# Patient Record
Sex: Male | Born: 1935
Health system: Southern US, Community
[De-identification: ages and names within clinical notes are randomized; demographics above are authoritative.]

## PROBLEM LIST (undated history)

## (undated) DIAGNOSIS — I77810 Thoracic aortic ectasia: Secondary | ICD-10-CM

## (undated) DIAGNOSIS — G4752 REM sleep behavior disorder: Secondary | ICD-10-CM

## (undated) DIAGNOSIS — E7800 Pure hypercholesterolemia, unspecified: Secondary | ICD-10-CM

## (undated) DIAGNOSIS — I2510 Atherosclerotic heart disease of native coronary artery without angina pectoris: Secondary | ICD-10-CM

## (undated) DIAGNOSIS — Z5189 Encounter for other specified aftercare: Secondary | ICD-10-CM

## (undated) DIAGNOSIS — N289 Disorder of kidney and ureter, unspecified: Secondary | ICD-10-CM

## (undated) DIAGNOSIS — I700 Atherosclerosis of aorta: Secondary | ICD-10-CM

## (undated) DIAGNOSIS — K921 Melena: Secondary | ICD-10-CM

## (undated) DIAGNOSIS — I679 Cerebrovascular disease, unspecified: Secondary | ICD-10-CM

## (undated) DIAGNOSIS — I219 Acute myocardial infarction, unspecified: Secondary | ICD-10-CM

## (undated) DIAGNOSIS — I10 Essential (primary) hypertension: Secondary | ICD-10-CM

## (undated) DIAGNOSIS — I714 Abdominal aortic aneurysm, without rupture, unspecified: Secondary | ICD-10-CM

## (undated) HISTORY — DX: Atherosclerosis of aorta: I70.0

## (undated) HISTORY — DX: Encounter for other specified aftercare: Z51.89

## (undated) HISTORY — DX: Thoracic aortic ectasia: I77.810

## (undated) HISTORY — DX: Abdominal aortic aneurysm, without rupture, unspecified: I71.40

## (undated) HISTORY — DX: Pure hypercholesterolemia, unspecified: E78.00

## (undated) HISTORY — DX: Atherosclerotic heart disease of native coronary artery without angina pectoris: I25.10

## (undated) HISTORY — PX: BRAIN SURGERY: SHX531

---
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
## 2003-12-08 ENCOUNTER — Ambulatory Visit (HOSPITAL_COMMUNITY): Admission: RE | Admit: 2003-12-08 | Discharge: 2003-12-08 | Payer: Self-pay | Admitting: Gastroenterology

## 2004-09-23 ENCOUNTER — Encounter: Admission: RE | Admit: 2004-09-23 | Discharge: 2004-09-23 | Payer: Self-pay | Admitting: Nephrology

## 2004-12-05 ENCOUNTER — Encounter: Admission: RE | Admit: 2004-12-05 | Discharge: 2005-03-05 | Payer: Self-pay | Admitting: Nephrology

## 2011-03-22 DIAGNOSIS — N183 Chronic kidney disease, stage 3 unspecified: Secondary | ICD-10-CM | POA: Diagnosis not present

## 2011-03-22 DIAGNOSIS — D509 Iron deficiency anemia, unspecified: Secondary | ICD-10-CM | POA: Diagnosis not present

## 2011-03-22 DIAGNOSIS — N2581 Secondary hyperparathyroidism of renal origin: Secondary | ICD-10-CM | POA: Diagnosis not present

## 2011-03-22 DIAGNOSIS — I10 Essential (primary) hypertension: Secondary | ICD-10-CM | POA: Diagnosis not present

## 2011-06-23 DIAGNOSIS — Z79899 Other long term (current) drug therapy: Secondary | ICD-10-CM | POA: Diagnosis not present

## 2011-06-23 DIAGNOSIS — Z125 Encounter for screening for malignant neoplasm of prostate: Secondary | ICD-10-CM | POA: Diagnosis not present

## 2011-06-23 DIAGNOSIS — I10 Essential (primary) hypertension: Secondary | ICD-10-CM | POA: Diagnosis not present

## 2011-06-23 DIAGNOSIS — E7800 Pure hypercholesterolemia, unspecified: Secondary | ICD-10-CM | POA: Diagnosis not present

## 2011-06-23 DIAGNOSIS — Q849 Congenital malformation of integument, unspecified: Secondary | ICD-10-CM | POA: Diagnosis not present

## 2011-07-10 DIAGNOSIS — L259 Unspecified contact dermatitis, unspecified cause: Secondary | ICD-10-CM | POA: Diagnosis not present

## 2011-11-07 DIAGNOSIS — E1129 Type 2 diabetes mellitus with other diabetic kidney complication: Secondary | ICD-10-CM | POA: Diagnosis not present

## 2011-11-07 DIAGNOSIS — IMO0001 Reserved for inherently not codable concepts without codable children: Secondary | ICD-10-CM | POA: Diagnosis not present

## 2011-11-07 DIAGNOSIS — I10 Essential (primary) hypertension: Secondary | ICD-10-CM | POA: Diagnosis not present

## 2011-11-07 DIAGNOSIS — R634 Abnormal weight loss: Secondary | ICD-10-CM | POA: Diagnosis not present

## 2011-11-14 DIAGNOSIS — IMO0001 Reserved for inherently not codable concepts without codable children: Secondary | ICD-10-CM | POA: Diagnosis not present

## 2011-11-20 DIAGNOSIS — N2581 Secondary hyperparathyroidism of renal origin: Secondary | ICD-10-CM | POA: Diagnosis not present

## 2011-11-20 DIAGNOSIS — N183 Chronic kidney disease, stage 3 unspecified: Secondary | ICD-10-CM | POA: Diagnosis not present

## 2011-11-20 DIAGNOSIS — D509 Iron deficiency anemia, unspecified: Secondary | ICD-10-CM | POA: Diagnosis not present

## 2011-11-20 DIAGNOSIS — I10 Essential (primary) hypertension: Secondary | ICD-10-CM | POA: Diagnosis not present

## 2011-12-06 ENCOUNTER — Other Ambulatory Visit (HOSPITAL_COMMUNITY): Payer: Self-pay | Admitting: *Deleted

## 2011-12-07 DIAGNOSIS — N183 Chronic kidney disease, stage 3 unspecified: Secondary | ICD-10-CM | POA: Diagnosis not present

## 2011-12-07 DIAGNOSIS — I10 Essential (primary) hypertension: Secondary | ICD-10-CM | POA: Diagnosis not present

## 2011-12-07 DIAGNOSIS — D509 Iron deficiency anemia, unspecified: Secondary | ICD-10-CM | POA: Diagnosis not present

## 2011-12-07 DIAGNOSIS — N2581 Secondary hyperparathyroidism of renal origin: Secondary | ICD-10-CM | POA: Diagnosis not present

## 2011-12-08 ENCOUNTER — Encounter (HOSPITAL_COMMUNITY)
Admission: RE | Admit: 2011-12-08 | Discharge: 2011-12-08 | Disposition: A | Discharge status: Home / Self Care | Payer: Medicare Other | Source: Ambulatory Visit | Attending: Nephrology | Admitting: Nephrology

## 2011-12-08 DIAGNOSIS — D649 Anemia, unspecified: Secondary | ICD-10-CM | POA: Insufficient documentation

## 2011-12-08 MED ORDER — SODIUM CHLORIDE 0.9 % IV SOLN
INTRAVENOUS | Status: DC
Start: 2011-12-08 — End: 2011-12-09
  Administered 2011-12-08: 10:00:00 via INTRAVENOUS

## 2011-12-08 MED ORDER — FERUMOXYTOL INJECTION 510 MG/17 ML
510.0000 mg | INTRAVENOUS | Status: DC
  Administered 2011-12-08: 510 mg via INTRAVENOUS

## 2011-12-08 MED ORDER — FERUMOXYTOL INJECTION 510 MG/17 ML
INTRAVENOUS | Status: AC
  Administered 2011-12-08: 510 mg via INTRAVENOUS
  Filled 2011-12-08: qty 17

## 2011-12-15 ENCOUNTER — Encounter (HOSPITAL_COMMUNITY)
Admission: RE | Admit: 2011-12-15 | Discharge: 2011-12-15 | Disposition: A | Discharge status: Home / Self Care | Payer: Medicare Other | Source: Ambulatory Visit | Attending: Nephrology | Admitting: Nephrology

## 2011-12-15 DIAGNOSIS — D649 Anemia, unspecified: Secondary | ICD-10-CM | POA: Insufficient documentation

## 2011-12-15 MED ORDER — FERUMOXYTOL INJECTION 510 MG/17 ML
510.0000 mg | INTRAVENOUS | Status: AC
  Administered 2011-12-15: 510 mg via INTRAVENOUS

## 2011-12-15 MED ORDER — SODIUM CHLORIDE 0.9 % IV SOLN
INTRAVENOUS | Status: AC
  Administered 2011-12-15: 11:00:00 via INTRAVENOUS

## 2011-12-19 DIAGNOSIS — IMO0001 Reserved for inherently not codable concepts without codable children: Secondary | ICD-10-CM | POA: Diagnosis not present

## 2012-02-27 DIAGNOSIS — E119 Type 2 diabetes mellitus without complications: Secondary | ICD-10-CM | POA: Diagnosis not present

## 2012-02-27 DIAGNOSIS — I10 Essential (primary) hypertension: Secondary | ICD-10-CM | POA: Diagnosis not present

## 2012-02-27 DIAGNOSIS — D509 Iron deficiency anemia, unspecified: Secondary | ICD-10-CM | POA: Diagnosis not present

## 2012-02-27 DIAGNOSIS — N2581 Secondary hyperparathyroidism of renal origin: Secondary | ICD-10-CM | POA: Diagnosis not present

## 2012-02-27 DIAGNOSIS — N183 Chronic kidney disease, stage 3 unspecified: Secondary | ICD-10-CM | POA: Diagnosis not present

## 2012-05-24 DIAGNOSIS — I10 Essential (primary) hypertension: Secondary | ICD-10-CM | POA: Diagnosis not present

## 2012-05-24 DIAGNOSIS — N183 Chronic kidney disease, stage 3 unspecified: Secondary | ICD-10-CM | POA: Diagnosis not present

## 2012-05-24 DIAGNOSIS — D509 Iron deficiency anemia, unspecified: Secondary | ICD-10-CM | POA: Diagnosis not present

## 2012-05-24 DIAGNOSIS — N2581 Secondary hyperparathyroidism of renal origin: Secondary | ICD-10-CM | POA: Diagnosis not present

## 2012-05-24 DIAGNOSIS — E119 Type 2 diabetes mellitus without complications: Secondary | ICD-10-CM | POA: Diagnosis not present

## 2012-06-11 DIAGNOSIS — I10 Essential (primary) hypertension: Secondary | ICD-10-CM | POA: Diagnosis not present

## 2012-06-11 DIAGNOSIS — E119 Type 2 diabetes mellitus without complications: Secondary | ICD-10-CM | POA: Diagnosis not present

## 2012-06-11 DIAGNOSIS — N183 Chronic kidney disease, stage 3 unspecified: Secondary | ICD-10-CM | POA: Diagnosis not present

## 2012-06-11 DIAGNOSIS — D509 Iron deficiency anemia, unspecified: Secondary | ICD-10-CM | POA: Diagnosis not present

## 2012-09-16 DIAGNOSIS — E119 Type 2 diabetes mellitus without complications: Secondary | ICD-10-CM | POA: Diagnosis not present

## 2012-09-16 DIAGNOSIS — D509 Iron deficiency anemia, unspecified: Secondary | ICD-10-CM | POA: Diagnosis not present

## 2012-09-16 DIAGNOSIS — N183 Chronic kidney disease, stage 3 unspecified: Secondary | ICD-10-CM | POA: Diagnosis not present

## 2012-09-16 DIAGNOSIS — I10 Essential (primary) hypertension: Secondary | ICD-10-CM | POA: Diagnosis not present

## 2012-12-18 DIAGNOSIS — N183 Chronic kidney disease, stage 3 unspecified: Secondary | ICD-10-CM | POA: Diagnosis not present

## 2012-12-18 DIAGNOSIS — D509 Iron deficiency anemia, unspecified: Secondary | ICD-10-CM | POA: Diagnosis not present

## 2012-12-18 DIAGNOSIS — E119 Type 2 diabetes mellitus without complications: Secondary | ICD-10-CM | POA: Diagnosis not present

## 2012-12-18 DIAGNOSIS — I10 Essential (primary) hypertension: Secondary | ICD-10-CM | POA: Diagnosis not present

## 2013-01-08 DIAGNOSIS — N058 Unspecified nephritic syndrome with other morphologic changes: Secondary | ICD-10-CM | POA: Diagnosis not present

## 2013-01-08 DIAGNOSIS — N183 Chronic kidney disease, stage 3 unspecified: Secondary | ICD-10-CM | POA: Diagnosis not present

## 2013-01-08 DIAGNOSIS — E1129 Type 2 diabetes mellitus with other diabetic kidney complication: Secondary | ICD-10-CM | POA: Diagnosis not present

## 2013-03-03 DIAGNOSIS — J019 Acute sinusitis, unspecified: Secondary | ICD-10-CM | POA: Diagnosis not present

## 2013-06-18 DIAGNOSIS — N183 Chronic kidney disease, stage 3 unspecified: Secondary | ICD-10-CM | POA: Diagnosis not present

## 2013-06-18 DIAGNOSIS — N2581 Secondary hyperparathyroidism of renal origin: Secondary | ICD-10-CM | POA: Diagnosis not present

## 2013-06-18 DIAGNOSIS — E785 Hyperlipidemia, unspecified: Secondary | ICD-10-CM | POA: Diagnosis not present

## 2014-01-22 DIAGNOSIS — N2581 Secondary hyperparathyroidism of renal origin: Secondary | ICD-10-CM | POA: Diagnosis not present

## 2014-01-22 DIAGNOSIS — E1129 Type 2 diabetes mellitus with other diabetic kidney complication: Secondary | ICD-10-CM | POA: Diagnosis not present

## 2014-01-22 DIAGNOSIS — N183 Chronic kidney disease, stage 3 (moderate): Secondary | ICD-10-CM | POA: Diagnosis not present

## 2014-02-04 DIAGNOSIS — N183 Chronic kidney disease, stage 3 (moderate): Secondary | ICD-10-CM | POA: Diagnosis not present

## 2014-02-04 DIAGNOSIS — I129 Hypertensive chronic kidney disease with stage 1 through stage 4 chronic kidney disease, or unspecified chronic kidney disease: Secondary | ICD-10-CM | POA: Diagnosis not present

## 2014-04-21 DIAGNOSIS — N183 Chronic kidney disease, stage 3 (moderate): Secondary | ICD-10-CM | POA: Diagnosis not present

## 2014-04-21 DIAGNOSIS — N2581 Secondary hyperparathyroidism of renal origin: Secondary | ICD-10-CM | POA: Diagnosis not present

## 2014-04-21 DIAGNOSIS — D509 Iron deficiency anemia, unspecified: Secondary | ICD-10-CM | POA: Diagnosis not present

## 2014-06-12 DIAGNOSIS — K921 Melena: Secondary | ICD-10-CM

## 2014-06-12 HISTORY — DX: Melena: K92.1

## 2014-06-24 DIAGNOSIS — N2581 Secondary hyperparathyroidism of renal origin: Secondary | ICD-10-CM | POA: Diagnosis not present

## 2014-06-24 DIAGNOSIS — N183 Chronic kidney disease, stage 3 (moderate): Secondary | ICD-10-CM | POA: Diagnosis not present

## 2014-06-24 DIAGNOSIS — D509 Iron deficiency anemia, unspecified: Secondary | ICD-10-CM | POA: Diagnosis not present

## 2014-06-24 DIAGNOSIS — E1129 Type 2 diabetes mellitus with other diabetic kidney complication: Secondary | ICD-10-CM | POA: Diagnosis not present

## 2014-07-10 ENCOUNTER — Inpatient Hospital Stay (HOSPITAL_COMMUNITY)
Admission: EM | Admit: 2014-07-10 | Discharge: 2014-07-13 | DRG: 378 | Disposition: A | Discharge status: Home / Self Care | Payer: Medicare Other | Attending: Internal Medicine | Admitting: Internal Medicine

## 2014-07-10 ENCOUNTER — Encounter (HOSPITAL_COMMUNITY): Payer: Self-pay | Admitting: *Deleted

## 2014-07-10 DIAGNOSIS — K253 Acute gastric ulcer without hemorrhage or perforation: Secondary | ICD-10-CM | POA: Diagnosis not present

## 2014-07-10 DIAGNOSIS — R000 Tachycardia, unspecified: Secondary | ICD-10-CM | POA: Diagnosis not present

## 2014-07-10 DIAGNOSIS — R42 Dizziness and giddiness: Secondary | ICD-10-CM | POA: Diagnosis present

## 2014-07-10 DIAGNOSIS — K921 Melena: Secondary | ICD-10-CM | POA: Diagnosis not present

## 2014-07-10 DIAGNOSIS — K2970 Gastritis, unspecified, without bleeding: Secondary | ICD-10-CM

## 2014-07-10 DIAGNOSIS — B9681 Helicobacter pylori [H. pylori] as the cause of diseases classified elsewhere: Secondary | ICD-10-CM | POA: Diagnosis not present

## 2014-07-10 DIAGNOSIS — E876 Hypokalemia: Secondary | ICD-10-CM | POA: Diagnosis present

## 2014-07-10 DIAGNOSIS — K250 Acute gastric ulcer with hemorrhage: Secondary | ICD-10-CM | POA: Diagnosis not present

## 2014-07-10 DIAGNOSIS — E871 Hypo-osmolality and hyponatremia: Secondary | ICD-10-CM | POA: Diagnosis present

## 2014-07-10 DIAGNOSIS — Z791 Long term (current) use of non-steroidal anti-inflammatories (NSAID): Secondary | ICD-10-CM

## 2014-07-10 DIAGNOSIS — Z7982 Long term (current) use of aspirin: Secondary | ICD-10-CM | POA: Diagnosis not present

## 2014-07-10 DIAGNOSIS — R609 Edema, unspecified: Secondary | ICD-10-CM | POA: Diagnosis present

## 2014-07-10 DIAGNOSIS — K254 Chronic or unspecified gastric ulcer with hemorrhage: Secondary | ICD-10-CM | POA: Diagnosis not present

## 2014-07-10 DIAGNOSIS — D62 Acute posthemorrhagic anemia: Secondary | ICD-10-CM | POA: Diagnosis present

## 2014-07-10 DIAGNOSIS — R040 Epistaxis: Secondary | ICD-10-CM | POA: Diagnosis not present

## 2014-07-10 DIAGNOSIS — N179 Acute kidney failure, unspecified: Secondary | ICD-10-CM | POA: Diagnosis present

## 2014-07-10 DIAGNOSIS — R739 Hyperglycemia, unspecified: Secondary | ICD-10-CM | POA: Diagnosis present

## 2014-07-10 DIAGNOSIS — E870 Hyperosmolality and hypernatremia: Secondary | ICD-10-CM | POA: Diagnosis present

## 2014-07-10 DIAGNOSIS — K2960 Other gastritis without bleeding: Secondary | ICD-10-CM | POA: Diagnosis present

## 2014-07-10 DIAGNOSIS — R1013 Epigastric pain: Secondary | ICD-10-CM | POA: Diagnosis not present

## 2014-07-10 DIAGNOSIS — E860 Dehydration: Secondary | ICD-10-CM | POA: Diagnosis not present

## 2014-07-10 DIAGNOSIS — I10 Essential (primary) hypertension: Secondary | ICD-10-CM | POA: Diagnosis present

## 2014-07-10 DIAGNOSIS — D696 Thrombocytopenia, unspecified: Secondary | ICD-10-CM | POA: Diagnosis present

## 2014-07-10 DIAGNOSIS — I959 Hypotension, unspecified: Secondary | ICD-10-CM | POA: Diagnosis present

## 2014-07-10 DIAGNOSIS — Z79899 Other long term (current) drug therapy: Secondary | ICD-10-CM

## 2014-07-10 DIAGNOSIS — K922 Gastrointestinal hemorrhage, unspecified: Secondary | ICD-10-CM | POA: Diagnosis not present

## 2014-07-10 HISTORY — DX: Essential (primary) hypertension: I10

## 2014-07-10 HISTORY — DX: Disorder of kidney and ureter, unspecified: N28.9

## 2014-07-10 HISTORY — DX: Acute posthemorrhagic anemia: D62

## 2014-07-10 LAB — CBC
HCT: 18.2 % — ABNORMAL LOW (ref 39.0–52.0)
Hemoglobin: 5.9 g/dL — CL (ref 13.0–17.0)
MCH: 26.7 pg (ref 26.0–34.0)
MCHC: 32.4 g/dL (ref 30.0–36.0)
MCV: 82.4 fL (ref 78.0–100.0)
Platelets: 165 10*3/uL (ref 150–400)
RBC: 2.21 MIL/uL — ABNORMAL LOW (ref 4.22–5.81)
RDW: 18.1 % — ABNORMAL HIGH (ref 11.5–15.5)
WBC: 9.1 10*3/uL (ref 4.0–10.5)

## 2014-07-10 LAB — BASIC METABOLIC PANEL
Anion gap: 4 — ABNORMAL LOW (ref 5–15)
BUN: 71 mg/dL — ABNORMAL HIGH (ref 6–23)
CO2: 24 mmol/L (ref 19–32)
Calcium: 8.8 mg/dL (ref 8.4–10.5)
Chloride: 116 mmol/L — ABNORMAL HIGH (ref 96–112)
Creatinine, Ser: 1.95 mg/dL — ABNORMAL HIGH (ref 0.50–1.35)
GFR calc Af Amer: 37 mL/min — ABNORMAL LOW (ref >90)
GFR calc non Af Amer: 32 mL/min — ABNORMAL LOW (ref >90)
Glucose, Bld: 200 mg/dL — ABNORMAL HIGH (ref 70–99)
Potassium: 4.5 mmol/L (ref 3.5–5.1)
Sodium: 144 mmol/L (ref 135–145)

## 2014-07-10 LAB — PREPARE RBC (CROSSMATCH)

## 2014-07-10 LAB — ABO/RH: ABO/RH(D): O POS

## 2014-07-10 LAB — POC OCCULT BLOOD, ED: Fecal Occult Bld: POSITIVE — AB

## 2014-07-10 MED ORDER — PANTOPRAZOLE SODIUM 40 MG IV SOLR
40.0000 mg | Freq: Two times a day (BID) | INTRAVENOUS | Status: DC
Start: 2014-07-14 — End: 2014-07-10

## 2014-07-10 MED ORDER — SODIUM CHLORIDE 0.9 % IV SOLN
10.0000 mL/h | Freq: Once | INTRAVENOUS | Status: AC
  Administered 2014-07-10: 10 mL/h via INTRAVENOUS

## 2014-07-10 MED ORDER — SODIUM CHLORIDE 0.9 % IV SOLN
8.0000 mg/h | INTRAVENOUS | Status: AC
  Administered 2014-07-10 – 2014-07-13 (×7): 8 mg/h via INTRAVENOUS
  Filled 2014-07-10 (×14): qty 80

## 2014-07-10 MED ORDER — SODIUM CHLORIDE 0.9 % IV BOLUS (SEPSIS)
1000.0000 mL | Freq: Once | INTRAVENOUS | Status: AC
  Administered 2014-07-10: 1000 mL via INTRAVENOUS

## 2014-07-10 MED ORDER — SODIUM CHLORIDE 0.9 % IV SOLN
INTRAVENOUS | Status: DC
  Administered 2014-07-11 – 2014-07-13 (×4): via INTRAVENOUS

## 2014-07-10 MED ORDER — SODIUM CHLORIDE 0.9 % IV SOLN
80.0000 mg | Freq: Once | INTRAVENOUS | Status: AC
  Administered 2014-07-10: 80 mg via INTRAVENOUS
  Filled 2014-07-10: qty 80

## 2014-07-10 MED ORDER — ONDANSETRON HCL 4 MG PO TABS: 4.0000 mg | ORAL_TABLET | Freq: Four times a day (QID) | ORAL | Status: DC | PRN

## 2014-07-10 MED ORDER — ONDANSETRON HCL 4 MG/2ML IJ SOLN: 4.0000 mg | Freq: Four times a day (QID) | INTRAMUSCULAR | Status: DC | PRN

## 2014-07-10 MED ORDER — PANTOPRAZOLE SODIUM 40 MG IV SOLR: 40.0000 mg | Freq: Two times a day (BID) | INTRAVENOUS | Status: DC

## 2014-07-10 MED ORDER — SODIUM CHLORIDE 0.9 % IJ SOLN
3.0000 mL | Freq: Two times a day (BID) | INTRAMUSCULAR | Status: DC
  Administered 2014-07-12 – 2014-07-13 (×3): 3 mL via INTRAVENOUS

## 2014-07-10 MED ORDER — SODIUM CHLORIDE 0.9 % IV SOLN: Freq: Once | INTRAVENOUS | Status: DC

## 2014-07-10 NOTE — H&P (Signed)
[facility] History and Physical  TARAY NORMOYLE ZOX:[id number] DOB: 1935/12/29 DOA: 07/10/2014  Referring physician: Dr. Silverio Lay PCP:  Duane Lope, MD   Chief Complaint: melena and dizziness   HPI:  Pt is 79 yo male who presented to [facility] ED with main concern of 3 days duration of epigastric pain, intermittent and throbbing, 7/10 in severity when present, non radiating, no specific alleviating or aggravating factors, no similar events in the past. Pt also reports noting melena in the past several days, dizziness and weakness especially with standing and exertion. Pt has also noted some exertional dyspnea but no cough, no chest pain. Pt denies fevers, chills, no specific urinary concerns, no specific focal neurological symptoms.   In ED, pt noted to be hemodynamically stable, VSS, blood work notable for Hg 5.9. TRH asked to admit for further evaluation. GI team consulted. Plan for EGD in AM, keep NPO after midnight.   Assessment and Plan: Principal Problem:   Acute blood loss anemia - with reported melena - one unit requested for transfusion in Ed, will ask for two more units PRBC for transfusion - repeat CBC in AM - plan for EGD in AM - appreciate GI team assistance    Hypotension  - from acute blood loss anemia - proceed with blood transfusion as noted above - also provide IVF   Acute renal failure - from pre renal etiology in the setting of acute blood loss anemia - will repeat BMP in AM   Hyperglycemia - check A1C with am blood work    DVT prophylaxis  - SCD's  Radiological Exams on Admission: No results found.   Code Status: Full Family Communication: Pt at bedside Disposition Plan: Admit for further evaluation    Danie Binder [facility] [phone]   Review of Systems:  Constitutional: Negative for fever, chills. Negative for diaphoresis.  HENT: Negative for hearing loss, ear pain, nosebleeds, congestion, sore throat, neck pain, tinnitus and ear discharge.   Eyes: Negative  for blurred vision, double vision, photophobia, pain, discharge and redness.  Respiratory: Negative for cough, hemoptysis, sputum production, wheezing and stridor.   Cardiovascular: Negative for palpitations, orthopnea, claudication and leg swelling.  Gastrointestinal: Negative for nausea, vomiting Genitourinary: Negative for dysuria, urgency, frequency, hematuria and flank pain.  Musculoskeletal: Negative for myalgias, back pain, joint pain and falls.  Skin: Negative for itching and rash.  Neurological: Negative for slurred speech  Endo/Heme/Allergies: Negative for environmental allergies and polydipsia. Does not bruise/bleed easily.  Psychiatric/Behavioral: Negative for suicidal ideas. The patient is not nervous/anxious.      Past Medical History  Diagnosis Date  . Hypertension   . Renal insufficiency     History reviewed. No pertinent past surgical history.  Social History:  reports that he has never smoked. He does not have any smokeless tobacco history on file. He reports that he does not drink alcohol. His drug history is not on file.  No Known Allergies  Family history of HLD   Prior to Admission medications   Medication Sig Start Date End Date Taking? Authorizing Provider  aspirin 81 MG tablet Take 81 mg by mouth daily.   Yes Historical Provider, MD  loratadine (CLARITIN) 10 MG tablet Take 10 mg by mouth as needed for allergies.   Yes Historical Provider, MD  naproxen sodium (ANAPROX) 220 MG tablet Take 440 mg by mouth every 6 (six) hours as needed (pain).   Yes Historical Provider, MD  paricalcitol (ZEMPLAR) 1 MCG capsule Take 1 mcg by mouth  daily. 05/26/14  Yes Historical Provider, MD  simvastatin (ZOCOR) 40 MG tablet Take 40 mg by mouth at bedtime.   Yes Historical Provider, MD    Physical Exam: Filed Vitals:   07/10/14 1521 07/10/14 1536 07/10/14 1545 07/10/14 1600  BP: 133/50 118/60 128/64 131/64  Pulse: 74     Temp:      TempSrc:      Resp:  13 19 13   Height:       Weight:      SpO2: 100%       Physical Exam  Constitutional: Appears well-developed and well-nourished. No distress.  HENT: Normocephalic. External right and left ear normal. Dry MM Eyes: Conjunctivae and EOM are normal. PERRLA, no scleral icterus.  Neck: Normal ROM. Neck supple. No JVD. No tracheal deviation. No thyromegaly.  CVS: RRR, S1/S2 +, no murmurs, no gallops, no carotid bruit.  Pulmonary: Effort and breath sounds normal, no stridor, rhonchi, wheezes, rales.  Abdominal: Soft. BS +,  no distension, tenderness, rebound or guarding.  Musculoskeletal: Normal range of motion. No edema and no tenderness.  Lymphadenopathy: No lymphadenopathy noted, cervical, inguinal. Neuro: Alert. Normal reflexes, muscle tone coordination. No cranial nerve deficit. Skin: Skin is warm and dry. No rash noted. Not diaphoretic. No erythema. No pallor.  Psychiatric: Normal mood and affect. Behavior, judgment, thought content normal.   Labs on Admission:  Basic Metabolic Panel:  Recent Labs Lab 07/10/14 1432  NA 144  K 4.5  CL 116*  CO2 24  GLUCOSE 200*  BUN 71*  CREATININE 1.95*  CALCIUM 8.8   CBC:  Recent Labs Lab 07/10/14 1432  WBC 9.1  HGB 5.9*  HCT 18.2*  MCV 82.4  PLT 165    EKG: pending    If 7PM-7AM, please contact night-coverage [email or web address] Password [email or web address] 07/10/2014, 4:15 PM

## 2014-07-10 NOTE — ED Notes (Signed)
Nurse getting type and screen from IV

## 2014-07-10 NOTE — Consult Note (Signed)
Referring Provider: No ref. provider found Primary Care Physician:   Duane Lope, MD Primary Gastroenterologist:  Gentry Fitz  Reason for Consultation:  Melena  HPI: Juan Goodwin is a 79 y.o. male admitted for weakness, dizziness and black stools for the past 4 days. Reports one loose black stool per day. Felt nauseous earlier this week without vomiting. Denies hematochezia. Reports occasional belching and heartburn over the last 2 years. Has had poor appetite this week. Denies weight loss, chest pain, dysuria, hematuria. Has felt short of breath while gardening recently. Hgb 5.9. Became syncopal during orthostatic check in the ER and BP dropped into the 70's but normalizes with lying down. Takes Naproxen and Aspirin. Denies alcohol stating that he drank socially 15 years ago. Denies history of ulcers and denies ever having an EGD. Thinks he had a colonoscopy in the past but does not know the results and no record of one in his [facility] chart.   Past Medical History  Diagnosis Date  . Hypertension   . Renal insufficiency     History reviewed. No pertinent past surgical history.  Prior to Admission medications   Medication Sig Start Date End Date Taking? Authorizing Provider  aspirin 81 MG tablet Take 81 mg by mouth daily.   Yes Historical Provider, MD  loratadine (CLARITIN) 10 MG tablet Take 10 mg by mouth as needed for allergies.   Yes Historical Provider, MD  naproxen sodium (ANAPROX) 220 MG tablet Take 440 mg by mouth every 6 (six) hours as needed (pain).   Yes Historical Provider, MD  paricalcitol (ZEMPLAR) 1 MCG capsule Take 1 mcg by mouth daily. 05/26/14  Yes Historical Provider, MD  simvastatin (ZOCOR) 40 MG tablet Take 40 mg by mouth at bedtime.   Yes Historical Provider, MD    Scheduled Meds: . [START ON 07/14/2014] pantoprazole (PROTONIX) IV  40 mg Intravenous Q12H  . sodium chloride  3 mL Intravenous Q12H   Continuous Infusions: . sodium chloride    . pantoprozole (PROTONIX)  infusion 8 mg/hr (07/10/14 1614)   PRN Meds:.ondansetron **OR** ondansetron (ZOFRAN) IV  Allergies as of 07/10/2014  . (No Known Allergies)    History reviewed. No pertinent family history.  History   Social History  . Marital Status: Widowed    Spouse Name: N/A  . Number of Children: N/A  . Years of Education: N/A   Occupational History  . Not on file.   Social History Main Topics  . Smoking status: Never Smoker   . Smokeless tobacco: Never Used  . Alcohol Use: No  . Drug Use: Not on file  . Sexual Activity: No   Other Topics Concern  . Not on file   Social History Narrative  . No narrative on file    Review of Systems: Positive in bold: Gen: Denies any fever, chills, rigors, night sweats, anorexia, fatigue, weakness, malaise, involuntary weight loss, and sleep disorder CV: Denies chest pain, angina, palpitations Resp: Denies dyspnea, cough, sputum, wheezing, coughing up blood. GI: negative except as stated above in HPI  GU : Denies urinary burning, blood in urine, urinary frequency MS: Denies joint pain or swelling.  Denies muscle weakness, cramps, atrophy.  Derm: Denies rash Psych: Denies depression, anxiety Heme: Denies bruising, bleeding Neuro:  Denies any headaches, dizziness Endo:  Denies any problems with DM, thyroid, adrenal function.  Physical Exam: Vital signs in last 24 hours: Temp:  [97.5 F (36.4 C)-98.4 F (36.9 C)] 98.4 F (36.9 C) (04/29 1747) Pulse Rate:  [74-107]  91 (04/29 1747) Resp:  [11-19] 18 (04/29 1747) BP: (75-139)/(49-68) 119/57 mmHg (04/29 1747) SpO2:  [100 %] 100 % (04/29 1747) Weight:  [88.451 kg (195 lb)] 88.451 kg (195 lb) (04/29 1357) Last BM Date: 07/08/14   General:   Well-developed, well-nourished, pleasant and cooperative in NAD Head:  Normocephalic and atraumatic. Eyes:  Sclera clear, no icterus.   Conjunctiva pink. Ears:  Normal auditory acuity. Nose:  No deformity Mouth:  No deformity or lesions.  Oropharynx  pink & moist. Neck:  Supple; no masses or thyromegaly. Lungs:  Clear throughout to auscultation.   No wheezes, crackles, or rhonchi. No acute distress. Heart:  Regular rate and rhythm; no murmurs, clicks, rubs,  or gallops. Abdomen:  Soft, nontender and nondistended. Normal bowel sounds, without guarding, and without rebound.   Rectal:  Deferred Msk:  Symmetrical without gross deformities. Normal posture. Pulses:  Normal pulses noted. Extremities:  Without clubbing or edema. Neurologic:  Alert and oriented x4;  grossly normal neurologically. Skin:  No rash Psych:  Alert and cooperative. Normal mood and affect.   Lab Results:  Recent Labs  07/10/14 1432  WBC 9.1  HGB 5.9*  HCT 18.2*  PLT 165   BMET  Recent Labs  07/10/14 1432  NA 144  K 4.5  CL 116*  CO2 24  GLUCOSE 200*  BUN 71*  CREATININE 1.95*  CALCIUM 8.8   LFT No results for input(s): PROT, ALBUMIN, AST, ALT, ALKPHOS, BILITOT, BILIDIR, IBILI in the last 72 hours. PT/INR No results for input(s): LABPROT, INR in the last 72 hours.   Studies/Results: No results found.  Impression: 79 yo with 4 days of melena and symptomatic anemia likely due to an upper GI bleed such as a peptic ulcer. AVMs also possible. Doubt malignancy but still possible. EGD needed to further evaluate his GI bleed. Risk/benefits of EGD discussed and he agrees to proceed.  Plan: 1. Volume resuscitate  2. EGD in AM 3. Agree with Protonix drip 4. NPO    LOS: 0 days   Garry Bochicchio C.  07/10/2014, 6:52 PM

## 2014-07-10 NOTE — ED Notes (Signed)
Pt arrives via [facility] with sig other. Pt has had nausea, fever, weakness (too weak to walk without help) for about 3 days. CBG 204, vss en route.

## 2014-07-10 NOTE — ED Provider Notes (Signed)
CSN: [id number]     Arrival date & time 07/10/14  1354 History   First MD Initiated Contact with Patient 07/10/14 1500     Chief Complaint  Patient presents with  . Weakness     (Consider location/radiation/quality/duration/timing/severity/associated sxs/prior Treatment) The history is provided by the patient.  Katherine MantleJimmy L Springston is a 79 y.o. male hx of HTN, renal insufficieny here with nausea, melena, diffuse weakness. Nauseated with epigastric pain for the last 3 days. Has decrease intake. Also noticed some melena. Also feels diffusely weak and dizzy when he stands up. He states that he may have passed out. Denies any chest pain or shortness of breath. Previous colonoscopy with [facility].    Past Medical History  Diagnosis Date  . Hypertension   . Renal insufficiency    History reviewed. No pertinent past surgical history. No family history on file. History  Substance Use Topics  . Smoking status: Never Smoker   . Smokeless tobacco: Not on file  . Alcohol Use: No    Review of Systems  Neurological: Positive for weakness.  All other systems reviewed and are negative.     Allergies  Review of patient's allergies indicates no known allergies.  Home Medications   Prior to Admission medications   Medication Sig Start Date End Date Taking? Authorizing Provider  aspirin 81 MG tablet Take 81 mg by mouth daily.   Yes Historical Provider, MD  loratadine (CLARITIN) 10 MG tablet Take 10 mg by mouth as needed for allergies.   Yes Historical Provider, MD  naproxen sodium (ANAPROX) 220 MG tablet Take 440 mg by mouth every 6 (six) hours as needed (pain).   Yes Historical Provider, MD  paricalcitol (ZEMPLAR) 1 MCG capsule Take 1 mcg by mouth daily. 05/26/14  Yes Historical Provider, MD  simvastatin (ZOCOR) 40 MG tablet Take 40 mg by mouth at bedtime.   Yes Historical Provider, MD   BP 118/51 mmHg  Pulse 107  Temp(Src) 97.5 F (36.4 C) (Oral)  Resp 17  Ht 6\' 1"  (1.854 m)  Wt 195 lb  (88.451 kg)  BMI 25.73 kg/m2  SpO2 100% Physical Exam  Constitutional: He is oriented to person, place, and time.  Pale, ill appearing   HENT:  Head: Normocephalic.  MM dry   Eyes: Pupils are equal, round, and reactive to light.  Conjunctiva pale   Neck: Normal range of motion. Neck supple.  Cardiovascular: Regular rhythm and normal heart sounds.   Tachy   Pulmonary/Chest: Effort normal and breath sounds normal. No respiratory distress. He has no wheezes. He has no rales.  Abdominal: Soft. Bowel sounds are normal. He exhibits no distension. There is no tenderness. There is no rebound and no guarding.  Musculoskeletal: Normal range of motion. He exhibits no edema or tenderness.  Neurological: He is alert and oriented to person, place, and time. No cranial nerve deficit. Coordination normal.  Skin: Skin is warm and dry.  Psychiatric: He has a normal mood and affect. His behavior is normal. Judgment and thought content normal.  Nursing note and vitals reviewed.   ED Course  Procedures (including critical care time) Labs Review Labs Reviewed  CBC - Abnormal; Notable for the following:    RBC 2.21 (*)    Hemoglobin 5.9 (*)    HCT 18.2 (*)    RDW 18.1 (*)    All other components within normal limits  BASIC METABOLIC PANEL - Abnormal; Notable for the following:    Chloride 116 (*)  Glucose, Bld 200 (*)    BUN 71 (*)    Creatinine, Ser 1.95 (*)    GFR calc non Af Amer 32 (*)    GFR calc Af Amer 37 (*)    Anion gap 4 (*)    All other components within normal limits  POC OCCULT BLOOD, ED  TYPE AND SCREEN  PREPARE RBC (CROSSMATCH)    Imaging Review No results found.   EKG Interpretation   Date/Time:  Friday July 10 2014 14:07:22 EDT Ventricular Rate:  97 PR Interval:  159 QRS Duration: 97 QT Interval:  364 QTC Calculation: 462 R Axis:   18 Text Interpretation:  Sinus rhythm Atrial premature complex No previous  ECGs available Confirmed by Hortence Charter  MD, Trayonna Bachmeier ([id number]) on  07/10/2014 3:00:39 PM      MDM   Final diagnoses:  None   SHADRACK BRUMMITT is a 79 y.o. male here with nausea, epigastric pain, GI bleed. Melena, guiac positive, Hg 5.9. Counseled regarding transfusion and patient agreed. Will get orthostatics and call [facility].   3:37 PM While the nurse is doing orthostatics, he passed out and became hypotensive to 70s. He woke up afterwards, no obvious head injury. Ordered PRBC. Called Dr. Bosie Clos from GI, who will see patient.      Richardean Canal, MD 07/10/14 [phone]

## 2014-07-10 NOTE — ED Notes (Signed)
Bed: WA16 Expected date:  Expected time:  Means of arrival:  Comments: EMS-weak 

## 2014-07-10 NOTE — ED Notes (Signed)
Pt sitting up for orthostatics. Immediately started staring ahead and not responding to pain or voice. Appears to be gasping for air. Eyes are moving medial and lateral. MD notified immediately by nurses outside of room. At bedside immediately. Started patient on non-rebreather 15L/min and 1L Bolus started. Another PIV started in RAC. Patient placed in trendelenburg position. Family updated on situation and plan of care.

## 2014-07-11 ENCOUNTER — Encounter (HOSPITAL_COMMUNITY)
Admission: EM | Disposition: A | Discharge status: Home / Self Care | Payer: Self-pay | Source: Home / Self Care | Attending: Internal Medicine

## 2014-07-11 ENCOUNTER — Encounter (HOSPITAL_COMMUNITY): Payer: Self-pay

## 2014-07-11 DIAGNOSIS — K921 Melena: Secondary | ICD-10-CM | POA: Diagnosis present

## 2014-07-11 DIAGNOSIS — K922 Gastrointestinal hemorrhage, unspecified: Secondary | ICD-10-CM

## 2014-07-11 HISTORY — DX: Melena: K92.1

## 2014-07-11 HISTORY — PX: ESOPHAGOGASTRODUODENOSCOPY: SHX5428

## 2014-07-11 HISTORY — DX: Gastrointestinal hemorrhage, unspecified: K92.2

## 2014-07-11 LAB — CBC
HCT: 26.6 % — ABNORMAL LOW (ref 39.0–52.0)
Hemoglobin: 8.5 g/dL — ABNORMAL LOW (ref 13.0–17.0)
MCH: 27.2 pg (ref 26.0–34.0)
MCHC: 32 g/dL (ref 30.0–36.0)
MCV: 85.3 fL (ref 78.0–100.0)
Platelets: 135 10*3/uL — ABNORMAL LOW (ref 150–400)
RBC: 3.12 MIL/uL — ABNORMAL LOW (ref 4.22–5.81)
RDW: 17 % — ABNORMAL HIGH (ref 11.5–15.5)
WBC: 8.6 10*3/uL (ref 4.0–10.5)

## 2014-07-11 LAB — BASIC METABOLIC PANEL
Anion gap: 6 (ref 5–15)
BUN: 63 mg/dL — ABNORMAL HIGH (ref 6–23)
CO2: 24 mmol/L (ref 19–32)
Calcium: 8.7 mg/dL (ref 8.4–10.5)
Chloride: 119 mmol/L — ABNORMAL HIGH (ref 96–112)
Creatinine, Ser: 1.87 mg/dL — ABNORMAL HIGH (ref 0.50–1.35)
GFR calc Af Amer: 39 mL/min — ABNORMAL LOW (ref >90)
GFR calc non Af Amer: 33 mL/min — ABNORMAL LOW (ref >90)
Glucose, Bld: 126 mg/dL — ABNORMAL HIGH (ref 70–99)
Potassium: 4.3 mmol/L (ref 3.5–5.1)
Sodium: 149 mmol/L — ABNORMAL HIGH (ref 135–145)

## 2014-07-11 LAB — URINALYSIS, ROUTINE W REFLEX MICROSCOPIC
Bilirubin Urine: NEGATIVE
Glucose, UA: NEGATIVE mg/dL
Hgb urine dipstick: NEGATIVE
Ketones, ur: NEGATIVE mg/dL
Leukocytes, UA: NEGATIVE
Nitrite: NEGATIVE
Protein, ur: NEGATIVE mg/dL
Specific Gravity, Urine: 1.016 (ref 1.005–1.030)
Urobilinogen, UA: 0.2 mg/dL (ref 0.0–1.0)
pH: 5 (ref 5.0–8.0)

## 2014-07-11 SURGERY — EGD (ESOPHAGOGASTRODUODENOSCOPY)
Anesthesia: Moderate Sedation

## 2014-07-11 MED ORDER — FENTANYL CITRATE (PF) 100 MCG/2ML IJ SOLN
INTRAMUSCULAR | Status: DC | PRN
  Administered 2014-07-11 (×2): 25 ug via INTRAVENOUS

## 2014-07-11 MED ORDER — MIDAZOLAM HCL 10 MG/2ML IJ SOLN
INTRAMUSCULAR | Status: AC
  Filled 2014-07-11: qty 2

## 2014-07-11 MED ORDER — MIDAZOLAM HCL 10 MG/2ML IJ SOLN
INTRAMUSCULAR | Status: DC | PRN
  Administered 2014-07-11 (×2): 2 mg via INTRAVENOUS

## 2014-07-11 MED ORDER — MORPHINE SULFATE 2 MG/ML IJ SOLN
1.0000 mg | INTRAMUSCULAR | Status: AC | PRN
Start: 2014-07-11 — End: 2014-07-12

## 2014-07-11 MED ORDER — FENTANYL CITRATE (PF) 100 MCG/2ML IJ SOLN
INTRAMUSCULAR | Status: AC
  Filled 2014-07-11: qty 2

## 2014-07-11 MED ORDER — BUTAMBEN-TETRACAINE-BENZOCAINE 2-2-14 % EX AERO
INHALATION_SPRAY | CUTANEOUS | Status: DC | PRN
  Administered 2014-07-11: 2 via TOPICAL

## 2014-07-11 MED ORDER — SODIUM CHLORIDE 0.9 % IJ SOLN
PREFILLED_SYRINGE | INTRAMUSCULAR | Status: DC | PRN
  Administered 2014-07-11: 3 mL

## 2014-07-11 MED ORDER — EPINEPHRINE HCL 0.1 MG/ML IJ SOSY
PREFILLED_SYRINGE | INTRAMUSCULAR | Status: AC
  Filled 2014-07-11: qty 10

## 2014-07-11 NOTE — H&P (View-Only) (Signed)
Referring Provider: No ref. provider found Primary Care Physician:   Ross, Alan, MD Primary Gastroenterologist:  Unassigned  Reason for Consultation:  Melena  HPI: Juan Goodwin is a 78 y.o. male admitted for weakness, dizziness and black stools for the past 4 days. Reports one loose black stool per day. Felt nauseous earlier this week without vomiting. Denies hematochezia. Reports occasional belching and heartburn over the last 2 years. Has had poor appetite this week. Denies weight loss, chest pain, dysuria, hematuria. Has felt short of breath while gardening recently. Hgb 5.9. Became syncopal during orthostatic check in the ER and BP dropped into the 70's but normalizes with lying down. Takes Naproxen and Aspirin. Denies alcohol stating that he drank socially 15 years ago. Denies history of ulcers and denies ever having an EGD. Thinks he had a colonoscopy in the past but does not know the results and no record of one in his [facility] chart.   Past Medical History  Diagnosis Date  . Hypertension   . Renal insufficiency     History reviewed. No pertinent past surgical history.  Prior to Admission medications   Medication Sig Start Date End Date Taking? Authorizing Provider  aspirin 81 MG tablet Take 81 mg by mouth daily.   Yes Historical Provider, MD  loratadine (CLARITIN) 10 MG tablet Take 10 mg by mouth as needed for allergies.   Yes Historical Provider, MD  naproxen sodium (ANAPROX) 220 MG tablet Take 440 mg by mouth every 6 (six) hours as needed (pain).   Yes Historical Provider, MD  paricalcitol (ZEMPLAR) 1 MCG capsule Take 1 mcg by mouth daily. 05/26/14  Yes Historical Provider, MD  simvastatin (ZOCOR) 40 MG tablet Take 40 mg by mouth at bedtime.   Yes Historical Provider, MD    Scheduled Meds: . [START ON 07/14/2014] pantoprazole (PROTONIX) IV  40 mg Intravenous Q12H  . sodium chloride  3 mL Intravenous Q12H   Continuous Infusions: . sodium chloride    . pantoprozole (PROTONIX)  infusion 8 mg/hr (07/10/14 1614)   PRN Meds:.ondansetron **OR** ondansetron (ZOFRAN) IV  Allergies as of 07/10/2014  . (No Known Allergies)    History reviewed. No pertinent family history.  History   Social History  . Marital Status: Widowed    Spouse Name: N/A  . Number of Children: N/A  . Years of Education: N/A   Occupational History  . Not on file.   Social History Main Topics  . Smoking status: Never Smoker   . Smokeless tobacco: Never Used  . Alcohol Use: No  . Drug Use: Not on file  . Sexual Activity: No   Other Topics Concern  . Not on file   Social History Narrative  . No narrative on file    Review of Systems: Positive in bold: Gen: Denies any fever, chills, rigors, night sweats, anorexia, fatigue, weakness, malaise, involuntary weight loss, and sleep disorder CV: Denies chest pain, angina, palpitations Resp: Denies dyspnea, cough, sputum, wheezing, coughing up blood. GI: negative except as stated above in HPI  GU : Denies urinary burning, blood in urine, urinary frequency MS: Denies joint pain or swelling.  Denies muscle weakness, cramps, atrophy.  Derm: Denies rash Psych: Denies depression, anxiety Heme: Denies bruising, bleeding Neuro:  Denies any headaches, dizziness Endo:  Denies any problems with DM, thyroid, adrenal function.  Physical Exam: Vital signs in last 24 hours: Temp:  [97.5 F (36.4 C)-98.4 F (36.9 C)] 98.4 F (36.9 C) (04/29 1747) Pulse Rate:  [74-107]   91 (04/29 1747) Resp:  [11-19] 18 (04/29 1747) BP: (75-139)/(49-68) 119/57 mmHg (04/29 1747) SpO2:  [100 %] 100 % (04/29 1747) Weight:  [88.451 kg (195 lb)] 88.451 kg (195 lb) (04/29 1357) Last BM Date: 07/08/14   General:   Well-developed, well-nourished, pleasant and cooperative in NAD Head:  Normocephalic and atraumatic. Eyes:  Sclera clear, no icterus.   Conjunctiva pink. Ears:  Normal auditory acuity. Nose:  No deformity Mouth:  No deformity or lesions.  Oropharynx  pink & moist. Neck:  Supple; no masses or thyromegaly. Lungs:  Clear throughout to auscultation.   No wheezes, crackles, or rhonchi. No acute distress. Heart:  Regular rate and rhythm; no murmurs, clicks, rubs,  or gallops. Abdomen:  Soft, nontender and nondistended. Normal bowel sounds, without guarding, and without rebound.   Rectal:  Deferred Msk:  Symmetrical without gross deformities. Normal posture. Pulses:  Normal pulses noted. Extremities:  Without clubbing or edema. Neurologic:  Alert and oriented x4;  grossly normal neurologically. Skin:  No rash Psych:  Alert and cooperative. Normal mood and affect.   Lab Results:  Recent Labs  07/10/14 1432  WBC 9.1  HGB 5.9*  HCT 18.2*  PLT 165   BMET  Recent Labs  07/10/14 1432  NA 144  K 4.5  CL 116*  CO2 24  GLUCOSE 200*  BUN 71*  CREATININE 1.95*  CALCIUM 8.8   LFT No results for input(s): PROT, ALBUMIN, AST, ALT, ALKPHOS, BILITOT, BILIDIR, IBILI in the last 72 hours. PT/INR No results for input(s): LABPROT, INR in the last 72 hours.   Studies/Results: No results found.  Impression: 78 yo with 4 days of melena and symptomatic anemia likely due to an upper GI bleed such as a peptic ulcer. AVMs also possible. Doubt malignancy but still possible. EGD needed to further evaluate his GI bleed. Risk/benefits of EGD discussed and he agrees to proceed.  Plan: 1. Volume resuscitate  2. EGD in AM 3. Agree with Protonix drip 4. NPO    LOS: 0 days   Tikesha Mort C.  07/10/2014, 6:52 PM    

## 2014-07-11 NOTE — Interval H&P Note (Signed)
History and Physical Interval Note:  07/11/2014 9:26 AM  Juan Goodwin  has presented today for surgery, with the diagnosis of melana  The various methods of treatment have been discussed with the patient and family. After consideration of risks, benefits and other options for treatment, the patient has consented to  Procedure(s): ESOPHAGOGASTRODUODENOSCOPY (EGD) (N/A) as a surgical intervention .  The patient's history has been reviewed, patient examined, no change in status, stable for surgery.  I have reviewed the patient's chart and labs.  Questions were answered to the patient's satisfaction.     Caniyah Murley C.

## 2014-07-11 NOTE — Progress Notes (Signed)
Patient ID: Katherine MantleJimmy L Fraticelli, male   DOB: May 04, 1935, 79 y.o.   MRN: [id number]  Patient had 8/10 abdominal pain in recovery from his EGD likely due to the submucosal epinephrine injection. Fentanyl 12 mcg IV given X 2 due to this pain. Morphine prn order placed. Pain should improve as the day progresses.

## 2014-07-11 NOTE — Op Note (Signed)
[facility] [address] [place], [id number]   ENDOSCOPY PROCEDURE REPORT  PATIENT: Juan Goodwin, Juan Goodwin  MR#: [id number] BIRTHDATE: 01-05-1936 , 78  yrs. old GENDER: male ENDOSCOPIST: Charlott RakesVincent Clariza Sickman, MD REFERRED BY: PROCEDURE DATE:  07/11/2014 PROCEDURE:  EGD w/ biopsy and EGD w/ control of bleeding ASA CLASS:     Class II INDICATIONS:  melena. MEDICATIONS: Fentanyl 50 mcg IV and Versed 4 mg IV; 3 cc XBM:WUXLKGepi:saline 1:10,000 U mixture injected submucosally TOPICAL ANESTHETIC: Cetacaine Spray X 2  DESCRIPTION OF PROCEDURE: After the risks benefits and alternatives of the procedure were thoroughly explained, informed consent was obtained.  The Pentax Gastroscope [id number] endoscope was introduced through the mouth and advanced to the second portion of the duodenum , Without limitations.  The instrument was slowly withdrawn as the mucosa was fully examined. Estimated blood loss is zero unless otherwise noted in this procedure report.    Esophagus normal and GEJ 42 cm from the incisors. Stomach body normal. Antrum edematous and narrowed pyloric channel likely due to functional obstruction from ulcer. A 4 mm  shallow ulcer noted in the pyloric channel with a flat nonbleeding red spot seen in the base of the ulcer. The endoscope carefully advanced through the pyloric channel with mild resistance and the duodenal bulb was normal in appearance. Scattered erythema noted in 2nd portion of the duodenum and at the genu consistent with minimal duodenitis. A biopsy was taken of the antrum to send for histology. 3 cc of MWN:UUVOZDepi:saline 1:10,000 U mixture injected submucosally at the base of the ulcer with good blanching noted.       Retroflexed views revealed no abnormalities.     The scope was then withdrawn from the patient and the procedure completed.  COMPLICATIONS: There were no immediate complications.  ENDOSCOPIC IMPRESSION:     Pyloric channel ulcer (likely NSAID-induced)  with bleeding stigmata and functional obstruction - s/p GUY:QIHKVQepi:saline injection Minimal duodenitis  RECOMMENDATIONS:     Clear liquid diet; Continue Protonix drip; F/U on path to check for H. pylori and treat if positive; Supportive care   eSigned:  Charlott RakesVincent Tameshia Bonneville, MD 07/11/2014 10:13 AM    CC:  CPT CODES: ICD CODES:  The ICD and CPT codes recommended by this software are interpretations from the data that the clinical staff has captured with the software.  The verification of the translation of this report to the ICD and CPT codes and modifiers is the sole responsibility of the health care institution and practicing physician where this report was generated.  [employer] will not be held responsible for the validity of the ICD and CPT codes included on this report.  AMA assumes no liability for data contained or not contained herein. CPT is a Publishing rights managerregistered trademark of the [employer].  PATIENT NAME:  Juan Goodwin, Juan Goodwin MR#: [id number]

## 2014-07-11 NOTE — Brief Op Note (Signed)
Pyloric channel ulcer with flat red spot. No active bleeding. See endopro for details. Clear liquids only today and if tolerates then slowly advance tomorrow. Continue Protonix drip.

## 2014-07-11 NOTE — Progress Notes (Signed)
Pt returned from EGD procedure.  Drowsy.  Reports pain " a little bit."  Significant other at bedside. IVs intact and fluids infusing.

## 2014-07-11 NOTE — Progress Notes (Signed)
Patient ID: Juan MantleJimmy L Goodwin, male   DOB: Apr 19, 1935, 79 y.o.   MRN: [id number]  [facility] PROGRESS NOTE  Juan Goodwin [id number] DOB: Apr 19, 1935 DOA: 07/10/2014 PCP:  Duane Lopeoss, Alan, MD   Brief narrative:    Pt is 79 yo male who [facility] with main concern of 3 days duration of epigastric pain, intermittent and throbbing, 7/10 in severity when present, non radiating, no specific alleviating or aggravating factors, no similar events in the past. Pt also reports noting melena in the past several days, dizziness and weakness especially with standing and exertion. Pt has also noted some exertional dyspnea but no cough, no chest pain. Pt denies fevers, chills, no specific urinary concerns, no specific focal neurological symptoms.   In ED, pt noted to be hemodynamically stable, VSS, blood work notable for Hg 5.9. [facility] asked to admit for further evaluation. GI team consulted.   Assessment/Plan:    Principal Problem:  Acute blood loss anemia - s/p EGD 4/30 by Dr. Bosie ClosSchooler - notable for pyloric channel ulcer (likely NSAID-induced) with bleeding stigmata and functional obstruction - recommendation is to advance diet to clear liquids, continue protonix drip, follow up on path, check for H. Pylori  - pt s/p three U PRBC transfusion on admission with Hg trend from 5.9 --> 8.5 - no indication for transfusion at this time  - appreciate GI team assistance  - repeat CBC in AM  Hypotension  - from acute blood loss anemia - responded well to IVf and blood products  Acute renal failure - from pre renal etiology in the setting of acute blood loss anemia - Cr is trending down: 1.98 --> 1.87 - will repeat BMP in AM   Hypernatremia - likely pre renal  - continue IVF and repeat BMP in AM  Hyperglycemia - A1C pending   DVT prophylaxis  - SCD's  Code Status: Full.  Family Communication:  plan of care discussed with the patient and girlfriend at bedside  Disposition Plan: requires  hospitalization, follow upon path report from EGD, and required protonix drip   IV access:  Peripheral IV  Procedures and diagnostic studies:    EGD 07/01/2014 by Dr. Bosie ClosSchooler - Pyloric channel ulcer (likely NSAID-induced) with bleeding stigmata and functional obstruction -  Medical Consultants:  GI  Other Consultants:  None  IAnti-Infectives:   None   Debbora PrestoMAGICK-Raynette Arras, MD  [facility] Pager [phone]  If 7PM-7AM, please contact night-coverage [email or web address] Password [email or web address] 07/11/2014, 10:33 AM   LOS: 1 day   HPI/Subjective: No events overnight. Mild abd discomfort, feels better overall.   Objective: Filed Vitals:   07/11/14 0945 07/11/14 0950 07/11/14 0954 07/11/14 0955  BP: 105/65 95/53 136/69   Pulse: 84 66 61 61  Temp:      TempSrc:      Resp: 19 18 13    Height:      Weight:      SpO2: 100% 100% 100% 100%    Intake/Output Summary (Last 24 hours) at 07/11/14 1033 Last data filed at 07/11/14 0825  Gross per 24 hour  Intake   1300 ml  Output    225 ml  Net   1075 ml    Exam:   General:  Pt is alert, follows commands appropriately, not in acute distress  Cardiovascular: Regular rate and rhythm, no rubs, no gallops  Respiratory: Clear to auscultation bilaterally, no wheezing, no crackles, no rhonchi  Abdomen: Soft, non tender, non distended, bowel sounds present, no guarding  Extremities: No edema, pulses DP and PT palpable bilaterally  Neuro: Grossly nonfocal  Data Reviewed: Basic Metabolic Panel:  Recent Labs Lab 07/10/14 1432 07/11/14 0500  NA 144 149*  K 4.5 4.3  CL 116* 119*  CO2 24 24  GLUCOSE 200* 126*  BUN 71* 63*  CREATININE 1.95* 1.87*  CALCIUM 8.8 8.7   CBC:  Recent Labs Lab 07/10/14 1432 07/11/14 0500  WBC 9.1 8.6  HGB 5.9* 8.5*  HCT 18.2* 26.6*  MCV 82.4 85.3  PLT 165 135*    Scheduled Meds: . sodium chloride   Intravenous Once  . [START ON 07/14/2014] pantoprazole (PROTONIX) IV  40 mg Intravenous Q12H  . sodium  chloride  3 mL Intravenous Q12H   Continuous Infusions: . sodium chloride 75 mL/hr at 07/11/14 0330  . pantoprozole (PROTONIX) infusion 8 mg/hr (07/11/14 0330)

## 2014-07-12 LAB — BASIC METABOLIC PANEL
Anion gap: 3 — ABNORMAL LOW (ref 5–15)
BUN: 31 mg/dL — ABNORMAL HIGH (ref 6–20)
CO2: 24 mmol/L (ref 22–32)
Calcium: 8 mg/dL — ABNORMAL LOW (ref 8.9–10.3)
Chloride: 120 mmol/L — ABNORMAL HIGH (ref 101–111)
Creatinine, Ser: 1.57 mg/dL — ABNORMAL HIGH (ref 0.61–1.24)
GFR calc Af Amer: 48 mL/min — ABNORMAL LOW (ref >60)
GFR calc non Af Amer: 41 mL/min — ABNORMAL LOW (ref >60)
Glucose, Bld: 97 mg/dL (ref 70–99)
Potassium: 3.6 mmol/L (ref 3.5–5.1)
Sodium: 147 mmol/L — ABNORMAL HIGH (ref 135–145)

## 2014-07-12 LAB — CBC
HCT: 24.2 % — ABNORMAL LOW (ref 39.0–52.0)
Hemoglobin: 7.9 g/dL — ABNORMAL LOW (ref 13.0–17.0)
MCH: 28.1 pg (ref 26.0–34.0)
MCHC: 32.6 g/dL (ref 30.0–36.0)
MCV: 86.1 fL (ref 78.0–100.0)
Platelets: 128 10*3/uL — ABNORMAL LOW (ref 150–400)
RBC: 2.81 MIL/uL — ABNORMAL LOW (ref 4.22–5.81)
RDW: 17.6 % — ABNORMAL HIGH (ref 11.5–15.5)
WBC: 6.2 10*3/uL (ref 4.0–10.5)

## 2014-07-12 MED ORDER — LORATADINE 10 MG PO TABS
10.0000 mg | ORAL_TABLET | Freq: Every day | ORAL | Status: DC
  Administered 2014-07-12: 10 mg via ORAL
  Filled 2014-07-12 (×2): qty 1

## 2014-07-12 NOTE — Progress Notes (Signed)
Patient ID: Juan Goodwin, male   DOB: [birth year], 79 y.o.   MRN: [id number]  [facility] PROGRESS NOTE  Juan Goodwin [id number]:[id number] DOB: January 31, 1936 DOA: 07/10/2014 PCP:  Duane Lope, MD   Brief narrative:    Pt is 79 yo male who presented to [facility] ED with main concern of 3 days duration of epigastric pain, intermittent and throbbing, 7/10 in severity when present, non radiating, no specific alleviating or aggravating factors, no similar events in the past. Pt also reports noting melena in the past several days, dizziness and weakness especially with standing and exertion. Pt has also noted some exertional dyspnea but no cough, no chest pain. Pt denies fevers, chills, no specific urinary concerns, no specific focal neurological symptoms.   In ED, pt noted to be hemodynamically stable, VSS, blood work notable for Hg 5.9. [facility] asked to admit for further evaluation. GI team consulted.   Assessment/Plan:    Principal Problem:  Acute blood loss anemia - s/p EGD 4/30 by Dr. Bosie Clos - notable for pyloric channel ulcer (likely NSAID-induced) with bleeding stigmata and functional obstruction - pt s/p three U PRBC transfusion on admission with Hg trend from 5.9 --> 8.5 - Hg drop overnight from 8.5 --> 7.9, will repeat CBC in AM and if hg < 7.5, plan on transfusing again - continue protonix drip, follow up on path, check for H. Pylori  - appreciate GI team assistance  - repeat CBC in AM  Hypotension  - from acute blood loss anemia - responded well to IVF and blood products  Acute renal failure - from pre renal etiology in the setting of acute blood loss anemia - Cr is trending down: 1.98 --> 1.87 --< 1.57 - will repeat BMP in AM   Thrombocytopenia - drop in Plt since admission - monitor CBC    Hypernatremia - likely pre renal  - Na trending down from 148 --> 147 - continue IVF and repeat BMP in Am  Hyperglycemia - A1C pending   DVT prophylaxis  - SCD's  Code Status: Full.   Family Communication:  plan of care discussed with the patient and girlfriend at bedside  Disposition Plan: requires hospitalization, repeat CBC in am as pt may need transfusion if Hg < 7.5   IV access:  Peripheral IV  Procedures and diagnostic studies:    EGD 07/01/2014 by Dr. Bosie Clos - Pyloric channel ulcer (likely NSAID-induced) with bleeding stigmata and functional obstruction -  Medical Consultants:  GI  Other Consultants:  None  IAnti-Infectives:   None   Debbora Presto, MD  [facility] Pager [phone]  If 7PM-7AM, please contact night-coverage [email or web address] Password [email or web address] 07/12/2014, 10:25 AM   LOS: 2 days   HPI/Subjective: No events overnight. Feels better overall.   Objective: Filed Vitals:   07/11/14 1042 07/11/14 1355 07/11/14 2200 07/12/14 0600  BP: 147/74 128/83 123/58 124/59  Pulse: 66 56 64 77  Temp:  98.1 F (36.7 C) 98.7 F (37.1 C) 98.5 F (36.9 C)  TempSrc:  Oral Oral Oral  Resp: Height:      Weight:      SpO2: 100% 100% 100% 99%    Intake/Output Summary (Last 24 hours) at 07/12/14 1025 Last data filed at 07/12/14 0803  Gross per 24 hour  Intake   2160 ml  Output      0 ml  Net   2160 ml    Exam:   General:  Pt is alert, follows commands appropriately,  not in acute distress  Cardiovascular: Regular rate and rhythm, no rubs, no gallops  Respiratory: Clear to auscultation bilaterally, no wheezing, no crackles, no rhonchi  Abdomen: Soft, non tender, non distended, bowel sounds present, no guarding  Extremities: No edema, pulses DP and PT palpable bilaterally  Neuro: Grossly nonfocal  Data Reviewed: Basic Metabolic Panel:  Recent Labs Lab 07/10/14 1432 07/11/14 0500 07/12/14 0512  NA 144 149* 147*  K 4.5 4.3 3.6  CL 116* 119* 120*  CO2 24 24 24   GLUCOSE 200* 126* 97  BUN 71* 63* 31*  CREATININE 1.95* 1.87* 1.57*  CALCIUM 8.8 8.7 8.0*   CBC:  Recent Labs Lab 07/10/14 1432 07/11/14 0500 07/12/14 0512   WBC 9.1 8.6 6.2  HGB 5.9* 8.5* 7.9*  HCT 18.2* 26.6* 24.2*  MCV 82.4 85.3 86.1  PLT 165 135* 128*    Scheduled Meds: . sodium chloride   Intravenous Once  . [START ON 07/14/2014] pantoprazole (PROTONIX) IV  40 mg Intravenous Q12H  . sodium chloride  3 mL Intravenous Q12H   Continuous Infusions: . sodium chloride 75 mL/hr at 07/12/14 0648  . pantoprozole (PROTONIX) infusion 8 mg/hr (07/12/14 0647)

## 2014-07-12 NOTE — Progress Notes (Signed)
Patient ID: Juan MantleJimmy L Goodwin, male   DOB: 28-Jul-1935, 79 y.o.   MRN: [id number] [facility]  Juan MantleJimmy L Goodwin 79 y.o. 28-Jul-1935   Subjective: Feels good. Denies abdominal pain, nausea, or vomiting. Reports short episode of epistaxis that did not return. Girlfriend in room.  Objective: Vital signs in last 24 hours: Filed Vitals:   07/12/14 1359  BP: 120/62  Pulse: 78  Temp: 97.7 F (36.5 C)  Resp: 18    Physical Exam: Gen: alert, no acute distress CV: RRR Chest: CTA B Abd: soft, nontender, nondistended, +BS  Lab Results:  Recent Labs  07/11/14 0500 07/12/14 0512  NA 149* 147*  K 4.3 3.6  CL 119* 120*  CO2 24 24  GLUCOSE 126* 97  BUN 63* 31*  CREATININE 1.87* 1.57*  CALCIUM 8.7 8.0*   No results for input(s): AST, ALT, ALKPHOS, BILITOT, PROT, ALBUMIN in the last 72 hours.  Recent Labs  07/11/14 0500 07/12/14 0512  WBC 8.6 6.2  HGB 8.5* 7.9*  HCT 26.6* 24.2*  MCV 85.3 86.1  PLT 135* 128*   No results for input(s): LABPROT, INR in the last 72 hours.    Assessment/Plan: 79 yo s/p peptic ulcer bleed (pyloric channel ulcer). He adamantly denies being on Naproxen or any other NSAID besides taking a baby aspirin. If biopsies show H. Pylori then treat as an outpt. Hgb decrease to 7.9 without any recurrent bleeding and likely dilutional. Nose bleed probably from nasal cannula oxygen yesterday. Tolerating POs. If Hgb stable and doing ok tomorrow then would recommend d/c 07/13/14 with f/u in my office in 3-4 weeks. Needs to take a PPI BID for 3 months and then QD. Will need to do a repeat EGD in 3 months to assess healing of the gastric ulcer. F/U on EGD biopsies as an outpt. Will sign off. Call if questions.   Linetta Regner C. 07/12/2014, 6:06 PM

## 2014-07-13 ENCOUNTER — Encounter (HOSPITAL_COMMUNITY): Payer: Self-pay | Admitting: Gastroenterology

## 2014-07-13 DIAGNOSIS — K922 Gastrointestinal hemorrhage, unspecified: Secondary | ICD-10-CM

## 2014-07-13 DIAGNOSIS — E876 Hypokalemia: Secondary | ICD-10-CM | POA: Clinically undetermined

## 2014-07-13 DIAGNOSIS — N179 Acute kidney failure, unspecified: Secondary | ICD-10-CM

## 2014-07-13 DIAGNOSIS — I959 Hypotension, unspecified: Secondary | ICD-10-CM

## 2014-07-13 DIAGNOSIS — E870 Hyperosmolality and hypernatremia: Secondary | ICD-10-CM

## 2014-07-13 DIAGNOSIS — D696 Thrombocytopenia, unspecified: Secondary | ICD-10-CM

## 2014-07-13 DIAGNOSIS — K253 Acute gastric ulcer without hemorrhage or perforation: Secondary | ICD-10-CM

## 2014-07-13 DIAGNOSIS — B9681 Helicobacter pylori [H. pylori] as the cause of diseases classified elsewhere: Secondary | ICD-10-CM

## 2014-07-13 DIAGNOSIS — K2970 Gastritis, unspecified, without bleeding: Secondary | ICD-10-CM

## 2014-07-13 DIAGNOSIS — K921 Melena: Secondary | ICD-10-CM | POA: Insufficient documentation

## 2014-07-13 HISTORY — DX: Acute kidney failure, unspecified: N17.9

## 2014-07-13 HISTORY — DX: Hyperosmolality and hypernatremia: E87.0

## 2014-07-13 HISTORY — DX: Thrombocytopenia, unspecified: D69.6

## 2014-07-13 HISTORY — DX: Hypotension, unspecified: I95.9

## 2014-07-13 HISTORY — DX: Acute gastric ulcer without hemorrhage or perforation: K25.3

## 2014-07-13 HISTORY — DX: Helicobacter pylori (H. pylori) as the cause of diseases classified elsewhere: B96.81

## 2014-07-13 HISTORY — DX: Gastritis, unspecified, without bleeding: K29.70

## 2014-07-13 HISTORY — DX: Gastrointestinal hemorrhage, unspecified: K92.2

## 2014-07-13 HISTORY — DX: Hypokalemia: E87.6

## 2014-07-13 LAB — TYPE AND SCREEN
ABO/RH(D): O POS
Antibody Screen: NEGATIVE
Unit division: 0
Unit division: 0
Unit division: 0

## 2014-07-13 LAB — BASIC METABOLIC PANEL
Anion gap: 5 (ref 5–15)
BUN: 17 mg/dL (ref 6–20)
CO2: 23 mmol/L (ref 22–32)
Calcium: 8.3 mg/dL — ABNORMAL LOW (ref 8.9–10.3)
Chloride: 114 mmol/L — ABNORMAL HIGH (ref 101–111)
Creatinine, Ser: 1.48 mg/dL — ABNORMAL HIGH (ref 0.61–1.24)
GFR calc Af Amer: 51 mL/min — ABNORMAL LOW (ref >60)
GFR calc non Af Amer: 44 mL/min — ABNORMAL LOW (ref >60)
Glucose, Bld: 108 mg/dL — ABNORMAL HIGH (ref 70–99)
Potassium: 3.4 mmol/L — ABNORMAL LOW (ref 3.5–5.1)
Sodium: 142 mmol/L (ref 135–145)

## 2014-07-13 LAB — HEMOGLOBIN A1C
Hgb A1c MFr Bld: 6.5 % — ABNORMAL HIGH (ref 4.8–5.6)
Mean Plasma Glucose: 140 mg/dL

## 2014-07-13 LAB — CBC
HCT: 24.7 % — ABNORMAL LOW (ref 39.0–52.0)
Hemoglobin: 8.1 g/dL — ABNORMAL LOW (ref 13.0–17.0)
MCH: 28 pg (ref 26.0–34.0)
MCHC: 32.8 g/dL (ref 30.0–36.0)
MCV: 85.5 fL (ref 78.0–100.0)
Platelets: 127 10*3/uL — ABNORMAL LOW (ref 150–400)
RBC: 2.89 MIL/uL — ABNORMAL LOW (ref 4.22–5.81)
RDW: 17.3 % — ABNORMAL HIGH (ref 11.5–15.5)
WBC: 5.1 10*3/uL (ref 4.0–10.5)

## 2014-07-13 MED ORDER — POTASSIUM CHLORIDE CRYS ER 20 MEQ PO TBCR
40.0000 meq | EXTENDED_RELEASE_TABLET | Freq: Once | ORAL | Status: AC
  Administered 2014-07-13: 40 meq via ORAL
  Filled 2014-07-13: qty 2

## 2014-07-13 MED ORDER — PANTOPRAZOLE SODIUM 40 MG PO TBEC: 40.0000 mg | DELAYED_RELEASE_TABLET | Freq: Two times a day (BID) | ORAL | Status: DC

## 2014-07-13 MED ORDER — AMOXICILL-CLARITHRO-LANSOPRAZ PO MISC: Freq: Two times a day (BID) | ORAL | Status: DC

## 2014-07-13 NOTE — Discharge Summary (Signed)
Physician Discharge Summary  Juan Goodwin [id number]:[id number] DOB: September 02, 1935 DOA: 07/10/2014  PCP:  Melinda Crutch, MD  Admit date: 07/10/2014 Discharge date: 07/13/2014  Time spent: 70 minutes  Recommendations for Outpatient Follow-up:  1. Follow up with Dr. Michail Sermon of  [facility] gastroenterology in 3 weeks. 2. Follow-up with  Melinda Crutch, MD in 1 week. On follow-up patient need a CBC done to follow-up on his hemoglobin. Patient will need a basic metabolic profile done to follow-up on electrolytes and renal function.  Discharge Diagnoses:  Principal Problem:   Acute upper GI bleed Active Problems:   Acute pyloric channel ulcer   Helicobacter pylori gastritis   Acute blood loss anemia   Melena   GI bleed   Hypokalemia   ARF (acute renal failure)   Hypernatremia   Hypotension   Thrombocytopenia   Gastrointestinal hemorrhage with melena   Discharge Condition: Stable  Diet recommendation: Regular  Filed Weights   07/10/14 1357  Weight: 88.451 kg (195 lb)    History of present illness:  Per Dr. Doyle Askew Pt is 79 yo male who presented to [facility] ED with main concern of 3 days duration of epigastric pain, intermittent and throbbing, 7/10 in severity when present, non radiating, no specific alleviating or aggravating factors, no similar events in the past. Pt also reported noting melena in the past several days, dizziness and weakness especially with standing and exertion. Pt has also noted some exertional dyspnea but no cough, no chest pain. Pt denied fevers, chills, no specific urinary concerns, no specific focal neurological symptoms.   In ED, pt noted to be hemodynamically stable, VSS, blood work notable for Hg 5.9. [facility] asked to admit for further evaluation. GI team consulted. Plan for EGD in AM, keep NPO after midnight.   Hospital Course:  #1 upper GI bleed/pyloric channel ulcer/H pylori gastritis Patient had presented with epigastric pain, melanotic stools, dizziness and weakness with standing  and exertion. Patient was noted on admission to have a hemoglobin of 5.9. Patient was transfused 3 units packed red blood cells with appropriate response with hemoglobin stabilizing at 8.1. Patient was admitted made nothing by mouth. Patient was placed on the Protonix drip, IV fluids and a GI consultation obtained. Patient was seen in consultation by Dr. Michail Sermon. Patient subsequently underwent an upper endoscopy on 07/11/2014 that showed pyloric channel ulcer with bleeding stigmata and functional obstruction status post appendectomy saline injection. It also showed minimal duodenitis. It was felt it was likely NSAID induced. Biopsies which were taken with a pylori positive. Patient improved clinically remained stable did not have any further bleeding and patient be discharged in stable and improved condition. Patient will be discharged on a Prevpac. Patient is also being discharged on Protonix twice daily for the next 3 months and then daily per GI recommendations. Patient will follow-up with Dr. Michail Sermon of gastroenterology in 3 weeks. Patient was a follow-up with PCP as outpatient.  #2 symptomatic anemia/acute blood loss anemia Patient presented with melanotic stools and abdominal pain. Patient was noted to have a upper GI bleed. Patient underwent an upper endoscopy which was positive for pyloric channel ulcer with minimal duodenitis. Patient was transfused 3 units packed red blood cells with appropriate response of her hemoglobin from 5.9 and stabilizing at 8.1. Patient did not have any further melanotic stools. Biopsies taken were positive for H pylori gastritis. Patient will be discharged on a Prevpac and subsequently on the PPI twice daily for the next 3 months and then daily per GI  recommendations. Patient will follow-up with GI as outpatient.  #3 hypotension On admission patient was noted to be hypotensive. Felt to be secondary to problem #1 and 2. Patient was hydrated with IV fluids. Patient was  transfused 3 units packed red blood cells. Patient's blood pressure improved did not have any further hypotensive episodes and this had resolved by day of discharge.  #4 acute renal failure Patient was noted to be in acute renal failure with a creatinine as high as 1.98. It was felt to be prerenal in nature. Patient was transfused 3 units of packed red blood cells. Patient patient was also placed on IV fluids. Patient's renal function improved such that by day of discharge his creatinine was down to 1.48. Outpatient follow-up with PCP.  #5 hyponatremia Patient was noted to be hyponatremic. Was felt to be secondary to volume depletion. Patient was hydrated with IV fluids with resolution of hypernatremia.  #6 thrombocytopenia Patient was noted to be thrombocytopenic. Patient platelets remained stable. Outpatient follow-up.  The rest of patient's chronic medical issues remain stable throughout the hospitalization and patient be discharged in stable and improved condition.  Procedures:  Upper endoscopy 07/11/2014 Dr. Alfredo Bach channel ulcer with bleeding stigmata and functional obstruction status post epi saline injection. Likely NSAID induced. Minimal duodenitis.  3 units packed red blood cells  Consultations:  Gastroenterology: Dr. Michail Sermon 07/10/2014  Discharge Exam: Filed Vitals:   07/13/14 0534  BP: 148/93  Pulse: 81  Temp: 98.5 F (36.9 C)  Resp: 20    General: NAD Cardiovascular: RRR Respiratory: CTAB  Discharge Instructions   Discharge Instructions    Diet general    Complete by:  As directed      Discharge instructions    Complete by:  As directed   Follow up with Dr Michail Sermon, GI  in 3 weeks. Follow up with  Melinda Crutch, MD in 1 week. No NSAID use.     Increase activity slowly    Complete by:  As directed           Current Discharge Medication List    START taking these medications   Details  amoxicillin-clarithromycin-lansoprazole (PREVPAC)  combo pack Take by mouth 2 (two) times daily. Follow package directions. Qty: 1 kit, Refills: 0    pantoprazole (PROTONIX) 40 MG tablet Take 1 tablet (40 mg total) by mouth 2 (two) times daily. TAKE 1 TABLET 2 TIMES DAILY X 3 MONTHS THEN 1 TABLET DAILY AFTER. Qty: 180 tablet, Refills: 4      CONTINUE these medications which have NOT CHANGED   Details  loratadine (CLARITIN) 10 MG tablet Take 10 mg by mouth as needed for allergies.    paricalcitol (ZEMPLAR) 1 MCG capsule Take 1 mcg by mouth daily. Refills: 5    simvastatin (ZOCOR) 40 MG tablet Take 40 mg by mouth at bedtime.      STOP taking these medications     aspirin 81 MG tablet      naproxen sodium (ANAPROX) 220 MG tablet        No Known Allergies Follow-up Information    Follow up with  Melinda Crutch, MD. Schedule an appointment as soon as possible for a visit in 1 week.   Specialty:  Family Medicine   Why:  Appointment with Dr. Harrington Challenger on 07/20/14 at 11:45 a.m. Please call [phone] if you need to reschedule.   Contact information:   [place] [address] [phone]       Follow up with  SCHOOLER,VINCENT C., MD. Schedule an appointment as soon as possible for a visit in 3 weeks.   Specialty:  Gastroenterology   Why:  Appointment with Dr. Michail Sermon on 08/03/14 at 1:00 p.m.    Contact information:   [address] [place] [address] [phone]        The results of significant diagnostics from this hospitalization (including imaging, microbiology, ancillary and laboratory) are listed below for reference.    Significant Diagnostic Studies: No results found.  Microbiology: No results found for this or any previous visit (from the past 240 hour(s)).   Labs: Basic Metabolic Panel:  Recent Labs Lab 07/10/14 1432 07/11/14 0500 07/12/14 0512 07/13/14 0523  NA 144 149* 147* 142  K 4.5 4.3 3.6 3.4*  CL 116* 119* 120* 114*  CO2 $Re'24 24 24 23  'auc$ GLUCOSE 200* 126* 97 108*  BUN  71* 63* 31* 17  CREATININE 1.95* 1.87* 1.57* 1.48*  CALCIUM 8.8 8.7 8.0* 8.3*   Liver Function Tests: No results for input(s): AST, ALT, ALKPHOS, BILITOT, PROT, ALBUMIN in the last 168 hours. No results for input(s): LIPASE, AMYLASE in the last 168 hours. No results for input(s): AMMONIA in the last 168 hours. CBC:  Recent Labs Lab 07/10/14 1432 07/11/14 0500 07/12/14 0512 07/13/14 0523  WBC 9.1 8.6 6.2 5.1  HGB 5.9* 8.5* 7.9* 8.1*  HCT 18.2* 26.6* 24.2* 24.7*  MCV 82.4 85.3 86.1 85.5  PLT 165 135* 128* 127*   Cardiac Enzymes: No results for input(s): CKTOTAL, CKMB, CKMBINDEX, TROPONINI in the last 168 hours. BNP: BNP (last 3 results) No results for input(s): BNP in the last 8760 hours.  ProBNP (last 3 results) No results for input(s): PROBNP in the last 8760 hours.  CBG: No results for input(s): GLUCAP in the last 168 hours.     SignedIrine Seal MD [facility] 07/13/2014, 4:31 PM

## 2014-07-13 NOTE — Progress Notes (Signed)
Discharge instructions reviewed with patient using teach back method. Follow up appointments in place.

## 2014-07-20 DIAGNOSIS — D500 Iron deficiency anemia secondary to blood loss (chronic): Secondary | ICD-10-CM | POA: Diagnosis not present

## 2014-07-20 DIAGNOSIS — K922 Gastrointestinal hemorrhage, unspecified: Secondary | ICD-10-CM | POA: Diagnosis not present

## 2014-08-03 DIAGNOSIS — K625 Hemorrhage of anus and rectum: Secondary | ICD-10-CM | POA: Diagnosis not present

## 2014-08-03 DIAGNOSIS — D509 Iron deficiency anemia, unspecified: Secondary | ICD-10-CM | POA: Diagnosis not present

## 2014-08-20 DIAGNOSIS — K5730 Diverticulosis of large intestine without perforation or abscess without bleeding: Secondary | ICD-10-CM | POA: Diagnosis not present

## 2014-08-20 DIAGNOSIS — D509 Iron deficiency anemia, unspecified: Secondary | ICD-10-CM | POA: Diagnosis not present

## 2014-08-20 DIAGNOSIS — K640 First degree hemorrhoids: Secondary | ICD-10-CM | POA: Diagnosis not present

## 2014-10-22 DIAGNOSIS — N183 Chronic kidney disease, stage 3 (moderate): Secondary | ICD-10-CM | POA: Diagnosis not present

## 2014-10-22 DIAGNOSIS — N2581 Secondary hyperparathyroidism of renal origin: Secondary | ICD-10-CM | POA: Diagnosis not present

## 2014-10-22 DIAGNOSIS — D509 Iron deficiency anemia, unspecified: Secondary | ICD-10-CM | POA: Diagnosis not present

## 2015-04-08 DIAGNOSIS — N183 Chronic kidney disease, stage 3 (moderate): Secondary | ICD-10-CM | POA: Diagnosis not present

## 2015-04-08 DIAGNOSIS — D509 Iron deficiency anemia, unspecified: Secondary | ICD-10-CM | POA: Diagnosis not present

## 2015-04-12 DIAGNOSIS — N183 Chronic kidney disease, stage 3 (moderate): Secondary | ICD-10-CM | POA: Diagnosis not present

## 2015-04-12 DIAGNOSIS — E1129 Type 2 diabetes mellitus with other diabetic kidney complication: Secondary | ICD-10-CM | POA: Diagnosis not present

## 2015-05-04 DIAGNOSIS — I129 Hypertensive chronic kidney disease with stage 1 through stage 4 chronic kidney disease, or unspecified chronic kidney disease: Secondary | ICD-10-CM | POA: Diagnosis not present

## 2015-06-07 DIAGNOSIS — Z23 Encounter for immunization: Secondary | ICD-10-CM | POA: Diagnosis not present

## 2015-06-07 DIAGNOSIS — I10 Essential (primary) hypertension: Secondary | ICD-10-CM | POA: Diagnosis not present

## 2015-06-07 DIAGNOSIS — H6123 Impacted cerumen, bilateral: Secondary | ICD-10-CM | POA: Diagnosis not present

## 2015-06-07 DIAGNOSIS — E1122 Type 2 diabetes mellitus with diabetic chronic kidney disease: Secondary | ICD-10-CM | POA: Diagnosis not present

## 2015-12-09 DIAGNOSIS — E1129 Type 2 diabetes mellitus with other diabetic kidney complication: Secondary | ICD-10-CM | POA: Diagnosis not present

## 2015-12-09 DIAGNOSIS — N183 Chronic kidney disease, stage 3 (moderate): Secondary | ICD-10-CM | POA: Diagnosis not present

## 2015-12-09 DIAGNOSIS — D509 Iron deficiency anemia, unspecified: Secondary | ICD-10-CM | POA: Diagnosis not present

## 2015-12-09 DIAGNOSIS — N2581 Secondary hyperparathyroidism of renal origin: Secondary | ICD-10-CM | POA: Diagnosis not present

## 2015-12-16 DIAGNOSIS — I952 Hypotension due to drugs: Secondary | ICD-10-CM | POA: Diagnosis not present

## 2015-12-16 DIAGNOSIS — E1122 Type 2 diabetes mellitus with diabetic chronic kidney disease: Secondary | ICD-10-CM | POA: Diagnosis not present

## 2015-12-23 DIAGNOSIS — I129 Hypertensive chronic kidney disease with stage 1 through stage 4 chronic kidney disease, or unspecified chronic kidney disease: Secondary | ICD-10-CM | POA: Diagnosis not present

## 2016-03-10 DIAGNOSIS — D509 Iron deficiency anemia, unspecified: Secondary | ICD-10-CM | POA: Diagnosis not present

## 2016-03-10 DIAGNOSIS — N183 Chronic kidney disease, stage 3 (moderate): Secondary | ICD-10-CM | POA: Diagnosis not present

## 2016-03-17 DIAGNOSIS — D509 Iron deficiency anemia, unspecified: Secondary | ICD-10-CM | POA: Diagnosis not present

## 2016-03-17 DIAGNOSIS — E1129 Type 2 diabetes mellitus with other diabetic kidney complication: Secondary | ICD-10-CM | POA: Diagnosis not present

## 2016-03-17 DIAGNOSIS — N183 Chronic kidney disease, stage 3 (moderate): Secondary | ICD-10-CM | POA: Diagnosis not present

## 2016-03-17 DIAGNOSIS — N2581 Secondary hyperparathyroidism of renal origin: Secondary | ICD-10-CM | POA: Diagnosis not present

## 2016-05-04 DIAGNOSIS — I129 Hypertensive chronic kidney disease with stage 1 through stage 4 chronic kidney disease, or unspecified chronic kidney disease: Secondary | ICD-10-CM | POA: Diagnosis not present

## 2016-06-12 DIAGNOSIS — D509 Iron deficiency anemia, unspecified: Secondary | ICD-10-CM | POA: Diagnosis not present

## 2016-06-12 DIAGNOSIS — N183 Chronic kidney disease, stage 3 (moderate): Secondary | ICD-10-CM | POA: Diagnosis not present

## 2016-06-15 DIAGNOSIS — E785 Hyperlipidemia, unspecified: Secondary | ICD-10-CM | POA: Diagnosis not present

## 2016-06-15 DIAGNOSIS — E1122 Type 2 diabetes mellitus with diabetic chronic kidney disease: Secondary | ICD-10-CM | POA: Diagnosis not present

## 2016-06-15 DIAGNOSIS — Z0000 Encounter for general adult medical examination without abnormal findings: Secondary | ICD-10-CM | POA: Diagnosis not present

## 2016-06-22 DIAGNOSIS — E1129 Type 2 diabetes mellitus with other diabetic kidney complication: Secondary | ICD-10-CM | POA: Diagnosis not present

## 2016-06-22 DIAGNOSIS — D509 Iron deficiency anemia, unspecified: Secondary | ICD-10-CM | POA: Diagnosis not present

## 2016-06-22 DIAGNOSIS — N183 Chronic kidney disease, stage 3 (moderate): Secondary | ICD-10-CM | POA: Diagnosis not present

## 2016-06-22 DIAGNOSIS — N2581 Secondary hyperparathyroidism of renal origin: Secondary | ICD-10-CM | POA: Diagnosis not present

## 2016-10-13 DIAGNOSIS — D509 Iron deficiency anemia, unspecified: Secondary | ICD-10-CM | POA: Diagnosis not present

## 2016-10-13 DIAGNOSIS — N183 Chronic kidney disease, stage 3 (moderate): Secondary | ICD-10-CM | POA: Diagnosis not present

## 2016-12-19 DIAGNOSIS — E1122 Type 2 diabetes mellitus with diabetic chronic kidney disease: Secondary | ICD-10-CM | POA: Diagnosis not present

## 2016-12-19 DIAGNOSIS — Z79899 Other long term (current) drug therapy: Secondary | ICD-10-CM | POA: Diagnosis not present

## 2016-12-19 DIAGNOSIS — E785 Hyperlipidemia, unspecified: Secondary | ICD-10-CM | POA: Diagnosis not present

## 2017-01-10 DIAGNOSIS — N2581 Secondary hyperparathyroidism of renal origin: Secondary | ICD-10-CM | POA: Diagnosis not present

## 2017-01-10 DIAGNOSIS — D509 Iron deficiency anemia, unspecified: Secondary | ICD-10-CM | POA: Diagnosis not present

## 2017-01-10 DIAGNOSIS — E1129 Type 2 diabetes mellitus with other diabetic kidney complication: Secondary | ICD-10-CM | POA: Diagnosis not present

## 2017-01-10 DIAGNOSIS — N183 Chronic kidney disease, stage 3 (moderate): Secondary | ICD-10-CM | POA: Diagnosis not present

## 2017-03-26 ENCOUNTER — Emergency Department (HOSPITAL_COMMUNITY): Payer: Medicare HMO | Admitting: Certified Registered"

## 2017-03-26 ENCOUNTER — Encounter (HOSPITAL_COMMUNITY): Payer: Self-pay

## 2017-03-26 ENCOUNTER — Other Ambulatory Visit: Payer: Self-pay

## 2017-03-26 ENCOUNTER — Encounter (HOSPITAL_COMMUNITY)
Admission: EM | Disposition: A | Discharge status: Home / Self Care | Payer: Self-pay | Source: Home / Self Care | Attending: Neurosurgery

## 2017-03-26 ENCOUNTER — Emergency Department (HOSPITAL_COMMUNITY): Payer: Medicare HMO

## 2017-03-26 ENCOUNTER — Inpatient Hospital Stay (HOSPITAL_COMMUNITY)
Admission: EM | Admit: 2017-03-26 | Discharge: 2017-03-30 | DRG: 026 | Disposition: A | Discharge status: Home / Self Care | Payer: Medicare HMO | Attending: Neurosurgery | Admitting: Neurosurgery

## 2017-03-26 DIAGNOSIS — I951 Orthostatic hypotension: Secondary | ICD-10-CM | POA: Diagnosis not present

## 2017-03-26 DIAGNOSIS — Z79899 Other long term (current) drug therapy: Secondary | ICD-10-CM | POA: Diagnosis not present

## 2017-03-26 DIAGNOSIS — N179 Acute kidney failure, unspecified: Secondary | ICD-10-CM | POA: Diagnosis not present

## 2017-03-26 DIAGNOSIS — N183 Chronic kidney disease, stage 3 unspecified: Secondary | ICD-10-CM | POA: Diagnosis present

## 2017-03-26 DIAGNOSIS — R4701 Aphasia: Secondary | ICD-10-CM | POA: Diagnosis not present

## 2017-03-26 DIAGNOSIS — J9690 Respiratory failure, unspecified, unspecified whether with hypoxia or hypercapnia: Secondary | ICD-10-CM | POA: Diagnosis not present

## 2017-03-26 DIAGNOSIS — S065XAA Traumatic subdural hemorrhage with loss of consciousness status unknown, initial encounter: Secondary | ICD-10-CM

## 2017-03-26 DIAGNOSIS — I959 Hypotension, unspecified: Secondary | ICD-10-CM | POA: Diagnosis not present

## 2017-03-26 DIAGNOSIS — I6201 Nontraumatic acute subdural hemorrhage: Secondary | ICD-10-CM | POA: Diagnosis not present

## 2017-03-26 DIAGNOSIS — I10 Essential (primary) hypertension: Secondary | ICD-10-CM | POA: Diagnosis not present

## 2017-03-26 DIAGNOSIS — G9349 Other encephalopathy: Secondary | ICD-10-CM | POA: Diagnosis not present

## 2017-03-26 DIAGNOSIS — I129 Hypertensive chronic kidney disease with stage 1 through stage 4 chronic kidney disease, or unspecified chronic kidney disease: Secondary | ICD-10-CM | POA: Diagnosis not present

## 2017-03-26 DIAGNOSIS — I6200 Nontraumatic subdural hemorrhage, unspecified: Secondary | ICD-10-CM | POA: Diagnosis not present

## 2017-03-26 DIAGNOSIS — S065X9A Traumatic subdural hemorrhage with loss of consciousness of unspecified duration, initial encounter: Secondary | ICD-10-CM

## 2017-03-26 DIAGNOSIS — R4702 Dysphasia: Secondary | ICD-10-CM | POA: Diagnosis present

## 2017-03-26 DIAGNOSIS — D631 Anemia in chronic kidney disease: Secondary | ICD-10-CM | POA: Diagnosis not present

## 2017-03-26 DIAGNOSIS — E785 Hyperlipidemia, unspecified: Secondary | ICD-10-CM | POA: Diagnosis not present

## 2017-03-26 DIAGNOSIS — I161 Hypertensive emergency: Secondary | ICD-10-CM | POA: Diagnosis not present

## 2017-03-26 DIAGNOSIS — R269 Unspecified abnormalities of gait and mobility: Secondary | ICD-10-CM | POA: Diagnosis not present

## 2017-03-26 HISTORY — PX: CRANIOTOMY: SHX93

## 2017-03-26 HISTORY — DX: Traumatic subdural hemorrhage with loss of consciousness status unknown, initial encounter: S06.5XAA

## 2017-03-26 HISTORY — DX: Traumatic subdural hemorrhage with loss of consciousness of unspecified duration, initial encounter: S06.5X9A

## 2017-03-26 LAB — COMPREHENSIVE METABOLIC PANEL
ALT: 10 U/L — ABNORMAL LOW (ref 17–63)
AST: 18 U/L (ref 15–41)
Albumin: 3.8 g/dL (ref 3.5–5.0)
Alkaline Phosphatase: 84 U/L (ref 38–126)
Anion gap: 12 (ref 5–15)
BUN: 27 mg/dL — ABNORMAL HIGH (ref 6–20)
CO2: 22 mmol/L (ref 22–32)
Calcium: 9.3 mg/dL (ref 8.9–10.3)
Chloride: 102 mmol/L (ref 101–111)
Creatinine, Ser: 1.78 mg/dL — ABNORMAL HIGH (ref 0.61–1.24)
GFR calc Af Amer: 40 mL/min — ABNORMAL LOW (ref >60)
GFR calc non Af Amer: 35 mL/min — ABNORMAL LOW (ref >60)
Glucose, Bld: 187 mg/dL — ABNORMAL HIGH (ref 65–99)
Potassium: 4 mmol/L (ref 3.5–5.1)
Sodium: 136 mmol/L (ref 135–145)
Total Bilirubin: 1.1 mg/dL (ref 0.3–1.2)
Total Protein: 7.6 g/dL (ref 6.5–8.1)

## 2017-03-26 LAB — TYPE AND SCREEN
ABO/RH(D): O POS
Antibody Screen: NEGATIVE

## 2017-03-26 LAB — ABO/RH: ABO/RH(D): O POS

## 2017-03-26 LAB — DIFFERENTIAL
Basophils Absolute: 0 10*3/uL (ref 0.0–0.1)
Basophils Relative: 1 %
Eosinophils Absolute: 0.2 10*3/uL (ref 0.0–0.7)
Eosinophils Relative: 3 %
Lymphocytes Relative: 26 %
Lymphs Abs: 1.7 10*3/uL (ref 0.7–4.0)
Monocytes Absolute: 0.6 10*3/uL (ref 0.1–1.0)
Monocytes Relative: 9 %
Neutro Abs: 4.1 10*3/uL (ref 1.7–7.7)
Neutrophils Relative %: 61 %

## 2017-03-26 LAB — APTT: aPTT: 36 seconds (ref 24–36)

## 2017-03-26 LAB — I-STAT CHEM 8, ED
BUN: 30 mg/dL — ABNORMAL HIGH (ref 6–20)
Calcium, Ion: 1.2 mmol/L (ref 1.15–1.40)
Chloride: 102 mmol/L (ref 101–111)
Creatinine, Ser: 1.6 mg/dL — ABNORMAL HIGH (ref 0.61–1.24)
Glucose, Bld: 193 mg/dL — ABNORMAL HIGH (ref 65–99)
HCT: 45 % (ref 39.0–52.0)
Hemoglobin: 15.3 g/dL (ref 13.0–17.0)
Potassium: 4.2 mmol/L (ref 3.5–5.1)
Sodium: 139 mmol/L (ref 135–145)
TCO2: 27 mmol/L (ref 22–32)

## 2017-03-26 LAB — CBC
HCT: 41.5 % (ref 39.0–52.0)
Hemoglobin: 13.7 g/dL (ref 13.0–17.0)
MCH: 26.9 pg (ref 26.0–34.0)
MCHC: 33 g/dL (ref 30.0–36.0)
MCV: 81.4 fL (ref 78.0–100.0)
Platelets: 194 10*3/uL (ref 150–400)
RBC: 5.1 MIL/uL (ref 4.22–5.81)
RDW: 15 % (ref 11.5–15.5)
WBC: 6.5 10*3/uL (ref 4.0–10.5)

## 2017-03-26 LAB — CBG MONITORING, ED: Glucose-Capillary: 147 mg/dL — ABNORMAL HIGH (ref 65–99)

## 2017-03-26 LAB — I-STAT TROPONIN, ED: Troponin i, poc: 0.01 ng/mL (ref 0.00–0.08)

## 2017-03-26 LAB — PROTIME-INR
INR: 1.07
Prothrombin Time: 13.8 seconds (ref 11.4–15.2)

## 2017-03-26 SURGERY — CRANIOTOMY HEMATOMA EVACUATION SUBDURAL
Anesthesia: General | Site: Head | Laterality: Left

## 2017-03-26 MED ORDER — SODIUM CHLORIDE 0.9 % IV SOLN
INTRAVENOUS | Status: DC
  Administered 2017-03-26 (×3): via INTRAVENOUS

## 2017-03-26 MED ORDER — ALBUMIN HUMAN 5 % IV SOLN
INTRAVENOUS | Status: DC | PRN
  Administered 2017-03-26: 22:00:00 via INTRAVENOUS

## 2017-03-26 MED ORDER — DEXAMETHASONE SODIUM PHOSPHATE 4 MG/ML IJ SOLN
4.0000 mg | Freq: Four times a day (QID) | INTRAMUSCULAR | Status: AC
  Administered 2017-03-27 – 2017-03-28 (×4): 4 mg via INTRAVENOUS
  Filled 2017-03-26 (×4): qty 1

## 2017-03-26 MED ORDER — FENTANYL CITRATE (PF) 250 MCG/5ML IJ SOLN
INTRAMUSCULAR | Status: DC | PRN
  Administered 2017-03-26: 100 ug via INTRAVENOUS

## 2017-03-26 MED ORDER — MEPERIDINE HCL 25 MG/ML IJ SOLN: 6.2500 mg | INTRAMUSCULAR | Status: DC | PRN

## 2017-03-26 MED ORDER — SODIUM CHLORIDE 0.9 % IV SOLN
500.0000 mg | Freq: Two times a day (BID) | INTRAVENOUS | Status: DC
  Administered 2017-03-27 – 2017-03-30 (×7): 500 mg via INTRAVENOUS
  Filled 2017-03-26 (×8): qty 5

## 2017-03-26 MED ORDER — LABETALOL HCL 5 MG/ML IV SOLN
INTRAVENOUS | Status: DC | PRN
  Administered 2017-03-26: 10 mg via INTRAVENOUS
  Administered 2017-03-26 (×2): 5 mg via INTRAVENOUS

## 2017-03-26 MED ORDER — ONDANSETRON HCL 4 MG/2ML IJ SOLN
INTRAMUSCULAR | Status: AC
  Filled 2017-03-26: qty 2

## 2017-03-26 MED ORDER — THROMBIN (RECOMBINANT) 5000 UNITS EX SOLR
OROMUCOSAL | Status: DC | PRN
  Administered 2017-03-26: 5 mL

## 2017-03-26 MED ORDER — EPHEDRINE SULFATE 50 MG/ML IJ SOLN
INTRAMUSCULAR | Status: DC | PRN
  Administered 2017-03-26: 10 mg via INTRAVENOUS

## 2017-03-26 MED ORDER — PROPOFOL 10 MG/ML IV BOLUS
INTRAVENOUS | Status: AC
  Filled 2017-03-26: qty 20

## 2017-03-26 MED ORDER — LABETALOL HCL 5 MG/ML IV SOLN
10.0000 mg | INTRAVENOUS | Status: DC | PRN
  Administered 2017-03-26: 5 mg via INTRAVENOUS

## 2017-03-26 MED ORDER — LABETALOL HCL 5 MG/ML IV SOLN
10.0000 mg | INTRAVENOUS | Status: DC | PRN
  Administered 2017-03-26: 5 mg via INTRAVENOUS
  Administered 2017-03-29: 20 mg via INTRAVENOUS
  Filled 2017-03-26 (×2): qty 4

## 2017-03-26 MED ORDER — ROCURONIUM BROMIDE 10 MG/ML (PF) SYRINGE
PREFILLED_SYRINGE | INTRAVENOUS | Status: DC | PRN
Start: 2017-03-26 — End: 2017-03-26
  Administered 2017-03-26: 50 mg via INTRAVENOUS

## 2017-03-26 MED ORDER — BACITRACIN ZINC 500 UNIT/GM EX OINT
TOPICAL_OINTMENT | CUTANEOUS | Status: DC | PRN
  Administered 2017-03-26: 1 via TOPICAL

## 2017-03-26 MED ORDER — THROMBIN (RECOMBINANT) 5000 UNITS EX SOLR
CUTANEOUS | Status: AC
  Filled 2017-03-26: qty 5000

## 2017-03-26 MED ORDER — MIDAZOLAM HCL 2 MG/2ML IJ SOLN: 0.5000 mg | Freq: Once | INTRAMUSCULAR | Status: DC | PRN

## 2017-03-26 MED ORDER — SUCCINYLCHOLINE CHLORIDE 200 MG/10ML IV SOSY
PREFILLED_SYRINGE | INTRAVENOUS | Status: DC | PRN
  Administered 2017-03-26: 120 mg via INTRAVENOUS

## 2017-03-26 MED ORDER — ONDANSETRON HCL 4 MG/2ML IJ SOLN
INTRAMUSCULAR | Status: DC | PRN
  Administered 2017-03-26: 4 mg via INTRAVENOUS

## 2017-03-26 MED ORDER — DEXAMETHASONE SODIUM PHOSPHATE 4 MG/ML IJ SOLN
4.0000 mg | Freq: Three times a day (TID) | INTRAMUSCULAR | Status: DC
Start: 2017-03-29 — End: 2017-03-30
  Administered 2017-03-29 – 2017-03-30 (×5): 4 mg via INTRAVENOUS
  Filled 2017-03-26 (×5): qty 1

## 2017-03-26 MED ORDER — PHENYLEPHRINE HCL 10 MG/ML IJ SOLN
INTRAVENOUS | Status: DC | PRN
  Administered 2017-03-26: 25 ug/min via INTRAVENOUS

## 2017-03-26 MED ORDER — SODIUM CHLORIDE 0.9 % IR SOLN
Status: DC | PRN
  Administered 2017-03-26: 500 mL

## 2017-03-26 MED ORDER — LIDOCAINE-EPINEPHRINE 1 %-1:100000 IJ SOLN
INTRAMUSCULAR | Status: DC | PRN
  Administered 2017-03-26: 7 mL via INTRADERMAL

## 2017-03-26 MED ORDER — LIDOCAINE-EPINEPHRINE 1 %-1:100000 IJ SOLN
INTRAMUSCULAR | Status: AC
  Filled 2017-03-26: qty 1

## 2017-03-26 MED ORDER — FENTANYL CITRATE (PF) 100 MCG/2ML IJ SOLN: 25.0000 ug | INTRAMUSCULAR | Status: DC | PRN

## 2017-03-26 MED ORDER — THROMBIN (RECOMBINANT) 20000 UNITS EX SOLR
CUTANEOUS | Status: AC
  Filled 2017-03-26: qty 20000

## 2017-03-26 MED ORDER — THROMBIN (RECOMBINANT) 20000 UNITS EX SOLR
CUTANEOUS | Status: DC | PRN
  Administered 2017-03-26: 20 mL

## 2017-03-26 MED ORDER — PROMETHAZINE HCL 25 MG/ML IJ SOLN: 6.2500 mg | INTRAMUSCULAR | Status: DC | PRN

## 2017-03-26 MED ORDER — CEFAZOLIN SODIUM-DEXTROSE 1-4 GM/50ML-% IV SOLN
INTRAVENOUS | Status: DC | PRN
  Administered 2017-03-26: 2 g via INTRAVENOUS

## 2017-03-26 MED ORDER — SUGAMMADEX SODIUM 200 MG/2ML IV SOLN
INTRAVENOUS | Status: AC
  Filled 2017-03-26: qty 2

## 2017-03-26 MED ORDER — 0.9 % SODIUM CHLORIDE (POUR BTL) OPTIME
TOPICAL | Status: DC | PRN
  Administered 2017-03-26 (×3): 1000 mL

## 2017-03-26 MED ORDER — HEMOSTATIC AGENTS (NO CHARGE) OPTIME
TOPICAL | Status: DC | PRN
  Administered 2017-03-26: 1 via TOPICAL

## 2017-03-26 MED ORDER — VANCOMYCIN HCL 1000 MG IV SOLR
INTRAVENOUS | Status: AC
  Filled 2017-03-26: qty 1000

## 2017-03-26 MED ORDER — BACITRACIN ZINC 500 UNIT/GM EX OINT
TOPICAL_OINTMENT | CUTANEOUS | Status: AC
  Filled 2017-03-26: qty 28.35

## 2017-03-26 MED ORDER — PROPOFOL 10 MG/ML IV BOLUS
INTRAVENOUS | Status: DC | PRN
  Administered 2017-03-26: 100 mg via INTRAVENOUS

## 2017-03-26 MED ORDER — DEXAMETHASONE SODIUM PHOSPHATE 10 MG/ML IJ SOLN
6.0000 mg | Freq: Four times a day (QID) | INTRAMUSCULAR | Status: AC
  Administered 2017-03-27 (×4): 6 mg via INTRAVENOUS
  Filled 2017-03-26 (×4): qty 1

## 2017-03-26 MED ORDER — LABETALOL HCL 5 MG/ML IV SOLN
INTRAVENOUS | Status: AC
  Administered 2017-03-26: 5 mg
  Filled 2017-03-26: qty 4

## 2017-03-26 MED ORDER — LIDOCAINE 2% (20 MG/ML) 5 ML SYRINGE
INTRAMUSCULAR | Status: DC | PRN
  Administered 2017-03-26: 20 mg via INTRAVENOUS

## 2017-03-26 MED ORDER — FENTANYL CITRATE (PF) 250 MCG/5ML IJ SOLN
INTRAMUSCULAR | Status: AC
  Filled 2017-03-26: qty 5

## 2017-03-26 MED ORDER — LABETALOL HCL 5 MG/ML IV SOLN
INTRAVENOUS | Status: AC
Start: 2017-03-26 — End: 2017-03-26
  Filled 2017-03-26: qty 4

## 2017-03-26 MED ORDER — EPHEDRINE 5 MG/ML INJ
INTRAVENOUS | Status: AC
  Filled 2017-03-26: qty 20

## 2017-03-26 MED ORDER — SUGAMMADEX SODIUM 200 MG/2ML IV SOLN
INTRAVENOUS | Status: DC | PRN
  Administered 2017-03-26: 200 mg via INTRAVENOUS

## 2017-03-26 SURGICAL SUPPLY — 78 items
ADH SKN CLS APL DERMABOND .7 (GAUZE/BANDAGES/DRESSINGS) ×1
BAG DECANTER FOR FLEXI CONT (MISCELLANEOUS) ×2 IMPLANT
BANDAGE GAUZE 4  KLING STR (GAUZE/BANDAGES/DRESSINGS) IMPLANT
BIT DRILL WIRE PASS 1.3MM (BIT) ×1 IMPLANT
BLADE CLIPPER SURG (BLADE) IMPLANT
BLADE SURG 11 STRL SS (BLADE) ×2 IMPLANT
BNDG CMPR 75X41 PLY ABS (GAUZE/BANDAGES/DRESSINGS) ×1
BNDG COHESIVE 4X5 TAN NS LF (GAUZE/BANDAGES/DRESSINGS) IMPLANT
BNDG GAUZE ELAST 4 BULKY (GAUZE/BANDAGES/DRESSINGS) ×2 IMPLANT
BNDG STRETCH 4X75 NS LF (GAUZE/BANDAGES/DRESSINGS) ×2 IMPLANT
BUR ACORN 9.0 PRECISION (BURR) ×2 IMPLANT
BUR SPIRAL ROUTER 2.3 (BUR) IMPLANT
CANISTER SUCT 3000ML PPV (MISCELLANEOUS) ×2 IMPLANT
CARTRIDGE OIL MAESTRO DRILL (MISCELLANEOUS) ×1 IMPLANT
CLIP VESOCCLUDE MED 6/CT (CLIP) IMPLANT
COVER BACK TABLE 60X90IN (DRAPES) ×4 IMPLANT
DECANTER SPIKE VIAL GLASS SM (MISCELLANEOUS) ×2 IMPLANT
DERMABOND ADVANCED (GAUZE/BANDAGES/DRESSINGS) ×1
DERMABOND ADVANCED .7 DNX12 (GAUZE/BANDAGES/DRESSINGS) ×1 IMPLANT
DIFFUSER DRILL AIR PNEUMATIC (MISCELLANEOUS) ×2 IMPLANT
DRAPE NEUROLOGICAL W/INCISE (DRAPES) ×2 IMPLANT
DRAPE SURG 17X23 STRL (DRAPES) IMPLANT
DRAPE WARM FLUID 44X44 (DRAPE) ×2 IMPLANT
DRILL WIRE PASS 1.3MM (BIT) ×2
DRSG OPSITE 4X5.5 SM (GAUZE/BANDAGES/DRESSINGS) ×6 IMPLANT
DURAMATRIX ONLAY 2X2 (Neuro Prosthesis/Implant) ×2 IMPLANT
ELECT CAUTERY BLADE 6.4 (BLADE) ×2 IMPLANT
ELECT REM PT RETURN 9FT ADLT (ELECTROSURGICAL) ×2
ELECTRODE REM PT RTRN 9FT ADLT (ELECTROSURGICAL) ×1 IMPLANT
GAUZE SPONGE 4X4 12PLY STRL (GAUZE/BANDAGES/DRESSINGS) ×2 IMPLANT
GAUZE SPONGE 4X4 16PLY XRAY LF (GAUZE/BANDAGES/DRESSINGS) IMPLANT
GLOVE BIO SURGEON STRL SZ7 (GLOVE) IMPLANT
GLOVE BIO SURGEON STRL SZ8 (GLOVE) ×2 IMPLANT
GLOVE BIOGEL M 7.0 STRL (GLOVE) ×8 IMPLANT
GLOVE BIOGEL PI IND STRL 7.0 (GLOVE) ×1 IMPLANT
GLOVE BIOGEL PI IND STRL 7.5 (GLOVE) ×1 IMPLANT
GLOVE BIOGEL PI INDICATOR 7.0 (GLOVE) ×1
GLOVE BIOGEL PI INDICATOR 7.5 (GLOVE) ×1
GLOVE EXAM NITRILE LRG STRL (GLOVE) IMPLANT
GLOVE EXAM NITRILE XL STR (GLOVE) IMPLANT
GLOVE EXAM NITRILE XS STR PU (GLOVE) IMPLANT
GLOVE INDICATOR 8.5 STRL (GLOVE) ×2 IMPLANT
GOWN STRL REUS W/ TWL LRG LVL3 (GOWN DISPOSABLE) ×3 IMPLANT
GOWN STRL REUS W/ TWL XL LVL3 (GOWN DISPOSABLE) ×1 IMPLANT
GOWN STRL REUS W/TWL 2XL LVL3 (GOWN DISPOSABLE) ×2 IMPLANT
GOWN STRL REUS W/TWL LRG LVL3 (GOWN DISPOSABLE) ×6
GOWN STRL REUS W/TWL XL LVL3 (GOWN DISPOSABLE) ×2
HEMOSTAT SURGICEL 2X14 (HEMOSTASIS) IMPLANT
KIT BASIN OR (CUSTOM PROCEDURE TRAY) ×2 IMPLANT
KIT ROOM TURNOVER OR (KITS) ×2 IMPLANT
NEEDLE HYPO 25X1 1.5 SAFETY (NEEDLE) ×2 IMPLANT
NS IRRIG 1000ML POUR BTL (IV SOLUTION) ×6 IMPLANT
OIL CARTRIDGE MAESTRO DRILL (MISCELLANEOUS) ×2
PACK CRANIOTOMY (CUSTOM PROCEDURE TRAY) ×2 IMPLANT
PAD ARMBOARD 7.5X6 YLW CONV (MISCELLANEOUS) ×2 IMPLANT
PATTIES SURGICAL .25X.25 (GAUZE/BANDAGES/DRESSINGS) IMPLANT
PATTIES SURGICAL .5 X.5 (GAUZE/BANDAGES/DRESSINGS) IMPLANT
PATTIES SURGICAL .5 X3 (DISPOSABLE) IMPLANT
PATTIES SURGICAL 1X1 (DISPOSABLE) IMPLANT
PIN MAYFIELD SKULL DISP (PIN) IMPLANT
PLATE 1.5  2HOLE LNG NEURO (Plate) ×1 IMPLANT
PLATE 1.5 2HOLE LNG NEURO (Plate) ×1 IMPLANT
PLATE 1.5 6HOLE XLONG DBL Y (Plate) ×4 IMPLANT
SCREW SELF DRILL HT 1.5/4MM (Screw) ×20 IMPLANT
SPONGE NEURO XRAY DETECT 1X3 (DISPOSABLE) IMPLANT
SPONGE SURGIFOAM ABS GEL 100 (HEMOSTASIS) ×4 IMPLANT
STAPLER SKIN PROX WIDE 3.9 (STAPLE) IMPLANT
STAPLER VISISTAT 35W (STAPLE) ×2 IMPLANT
SUT ETHILON 3 0 FSL (SUTURE) IMPLANT
SUT NURALON 4 0 TR CR/8 (SUTURE) ×4 IMPLANT
SUT VIC AB 2-0 CT1 18 (SUTURE) ×8 IMPLANT
SYR CONTROL 10ML LL (SYRINGE) ×2 IMPLANT
TAPE CLOTH 1X10 TAN NS (GAUZE/BANDAGES/DRESSINGS) ×2 IMPLANT
TOWEL GREEN STERILE (TOWEL DISPOSABLE) ×2 IMPLANT
TOWEL GREEN STERILE FF (TOWEL DISPOSABLE) ×2 IMPLANT
TRAY FOLEY W/METER SILVER 16FR (SET/KITS/TRAYS/PACK) ×2 IMPLANT
UNDERPAD 30X30 (UNDERPADS AND DIAPERS) IMPLANT
WATER STERILE IRR 1000ML POUR (IV SOLUTION) ×2 IMPLANT

## 2017-03-26 NOTE — Anesthesia Procedure Notes (Signed)
Procedure Name: Intubation Date/Time: 03/26/2017 9:22 PM Performed by: Myna Bright, CRNA Pre-anesthesia Checklist: Patient identified, Emergency Drugs available, Suction available and Patient being monitored Patient Re-evaluated:Patient Re-evaluated prior to induction Oxygen Delivery Method: Circle system utilized Preoxygenation: Pre-oxygenation with 100% oxygen Induction Type: IV induction, Rapid sequence and Cricoid Pressure applied Laryngoscope Size: Mac and 4 Grade View: Grade I Tube type: Subglottic suction tube Tube size: 7.5 mm Number of attempts: 1 Airway Equipment and Method: Stylet Placement Confirmation: ETT inserted through vocal cords under direct vision,  positive ETCO2 and breath sounds checked- equal and bilateral Secured at: 22 cm Tube secured with: Tape Dental Injury: Teeth and Oropharynx as per pre-operative assessment

## 2017-03-26 NOTE — Transfer of Care (Signed)
Immediate Anesthesia Transfer of Care Note  Patient: Juan Goodwin  Procedure(s) Performed: CRANIOTOMY HEMATOMA EVACUATION SUBDURAL (Left Head)  Patient Location: PACU  Anesthesia Type:General  Level of Consciousness: awake  Airway & Oxygen Therapy: Patient Spontanous Breathing and Patient connected to face mask oxygen  Post-op Assessment: Report given to RN and patient remains hypertensive, Dr. Jean RosenthalJackson aware, labatelol given  Post vital signs: Reviewed  Last Vitals:  Vitals:   03/26/17 1658 03/26/17 1830  BP: (!) 171/102 (!) 165/112  Pulse: 91 71  Resp: 18 16  Temp: 36.5 C   SpO2: 100% 100%    Last Pain:  Vitals:   03/26/17 1658  TempSrc: Oral  PainSc: 0-No pain         Complications: No apparent anesthesia complications

## 2017-03-26 NOTE — ED Provider Notes (Signed)
Patient placed in Quick Look pathway, seen and evaluated   Chief Complaint:  Altered mental status     HPI:   82 year old male presents today with complaints of altered mental status.  Patient's brother-in-law is at bedside who reports that he had a conversation with him last night and the patient was in his usual state of health.  He notes he has no significant memory issues.  He notes he called him around 230 today and patient was still in bed and was altered.  Patient is not able to provide any significant details, denies any pain or infectious symptoms.  ROS: Unable to perform due to altered mental status (one)  Physical Exam:   Gen: No distress  Neuro: Awake and Alert  Skin: Warm  Vitals:   03/26/17 1658  BP: (!) 171/102  Pulse: 91  Resp: 18  Temp: 97.7 F (36.5 C)  SpO2: 100%      Focused Exam: Cranial nerves intact-minor tremor in the right hand-patient is alert and is not oriented to person place time or event cardiac regular rate and rhythm  82 year old male with altered mental status.  Does not appear to have any infectious signs or symptoms I am unable to obtain a significant information out of him.  He is in absolutely no distress resting comfortably.  Stroke workup initiated no code stroke at this time.  Labs were drawn including urinalysis for source of infection for questionable encephalopathy.   Initiation of care has begun. The patient has been counseled on the process, plan, and necessity for staying for the completion/evaluation, and the remainder of the medical screening examination   Eyvonne MechanicHedges, Axcel Horsch, Cordelia Poche-C 03/26/17 1715    Jacalyn LefevreHaviland, Julie, MD 03/26/17 1731

## 2017-03-26 NOTE — Consult Note (Signed)
PULMONARY / CRITICAL CARE MEDICINE   Name: Juan Goodwin MRN: [id number] DOB: 1936-02-26    ADMISSION DATE:  03/26/2017 CONSULTATION DATE:  03/26/17  REFERRING MD:  Wynetta Emeryram  CHIEF COMPLAINT:  AMS  HISTORY OF PRESENT ILLNESS:  Pt is encephelopathic; therefore, this HPI is obtained from chart review. Juan MantleJimmy L Goodwin is a 82 y.o. male with PMH as outlined below.  [facility] with confusion.  In ED, he was found to have hypertensive emergency with BP 184 / 104.  CT of the head demonstrated a large left acute SDH for which he was taken to the OR for emergent craniotomy.  Post op, he returned to the ICU and PCCM was asked to assist with medical management.  PAST MEDICAL HISTORY :  He  has a past medical history of Hypertension and Renal insufficiency.  PAST SURGICAL HISTORY: He  has a past surgical history that includes Esophagogastroduodenoscopy (N/A, 07/11/2014).  No Known Allergies  No current facility-administered medications on file prior to encounter.    Current Outpatient Medications on File Prior to Encounter  Medication Sig  . amLODipine (NORVASC) 10 MG tablet Take 10 mg by mouth at bedtime.  Marland Kitchen. loratadine (CLARITIN) 10 MG tablet Take 10 mg by mouth daily as needed for allergies.   Marland Kitchen. olmesartan-hydrochlorothiazide (BENICAR HCT) 40-12.5 MG tablet Take 1 tablet by mouth daily.  . paricalcitol (ZEMPLAR) 1 MCG capsule Take 1 mcg by mouth daily.  . rosuvastatin (CRESTOR) 10 MG tablet Take 10 mg by mouth daily.  . pantoprazole (PROTONIX) 40 MG tablet Take 1 tablet (40 mg total) by mouth 2 (two) times daily. TAKE 1 TABLET 2 TIMES DAILY X 3 MONTHS THEN 1 TABLET DAILY AFTER. (Patient not taking: Reported on 03/26/2017)    FAMILY HISTORY:  His has no family status information on file.    SOCIAL HISTORY: He  reports that  has never smoked. he has never used smokeless tobacco. He reports that he does not drink alcohol.  REVIEW OF SYSTEMS:  Unable to obtain as pt is  encephalopathic.  SUBJECTIVE:  Awake, opens eyes, follows commands but does not speak or answer questions.  VITAL SIGNS: BP (!) 183/103 (BP Location: Right Arm)   Pulse 73   Temp (!) 97.5 F (36.4 C)   Resp 12   SpO2 100%   HEMODYNAMICS:    VENTILATOR SETTINGS:    INTAKE / OUTPUT: No intake/output data recorded.   PHYSICAL EXAMINATION: General: Adult male, resting in bed, in NAD. Neuro: A&O x 3, non-focal.  HEENT: Scalp dressings in place, C/D/I. EOMI, sclerae anicteric. Cardiovascular: RRR, no M/R/G.  Lungs: Respirations even and unlabored.  CTA bilaterally, No W/R/R. Abdomen: BS x 4, soft, NT/ND.  Musculoskeletal: No gross deformities, no edema.  Skin: Intact, warm, no rashes.    LABS:  BMET Recent Labs  Lab 03/26/17 1701 03/26/17 1719  NA 136 139  K 4.0 4.2  CL 102 102  CO2 22  --   BUN 27* 30*  CREATININE 1.78* 1.60*  GLUCOSE 187* 193*    Electrolytes Recent Labs  Lab 03/26/17 1701  CALCIUM 9.3    CBC Recent Labs  Lab 03/26/17 1701 03/26/17 1719  WBC 6.5  --   HGB 13.7 15.3  HCT 41.5 45.0  PLT 194  --     Coag's Recent Labs  Lab 03/26/17 1701  APTT 36  INR 1.07    Sepsis Markers No results for input(s): LATICACIDVEN, PROCALCITON, O2SATVEN in the last 168  hours.  ABG No results for input(s): PHART, PCO2ART, PO2ART in the last 168 hours.  Liver Enzymes Recent Labs  Lab 03/26/17 1701  AST 18  ALT 10*  ALKPHOS 84  BILITOT 1.1  ALBUMIN 3.8    Cardiac Enzymes No results for input(s): TROPONINI, PROBNP in the last 168 hours.  Glucose Recent Labs  Lab 03/26/17 1657  GLUCAP 147*    Imaging Ct Head Wo Contrast  Result Date: 03/26/2017 CLINICAL DATA:  82 y/o  M; altered level of consciousness. EXAM: CT HEAD WITHOUT CONTRAST TECHNIQUE: Contiguous axial images were obtained from the base of the skull through the vertex without intravenous contrast. COMPARISON:  None. FINDINGS: Brain: Acute subdural hematoma over the left  cerebral convexity, left falx, and left tentorium cerebelli. Hematoma measures up to 14 mm in the lateral frontal region. Mass effect from the hematoma effaces sulci, partially effaces the left lateral ventricle, and results and 6 mm of left-to-right midline shift. No large acute stroke, brain parenchymal hemorrhage, or focal mass effect of brain parenchyma identified. No downward herniation or effacement of basilar cisterns. Vascular: Calcific atherosclerosis of carotid siphons. No hyperdense vessel. Skull: Normal. Negative for fracture or focal lesion. Sinuses/Orbits: Left maxillary sinus mucous retention cyst and mild mucosal thickening of ethmoid air cells. Otherwise negative. Other: None. IMPRESSION: Acute subdural hematoma of left cerebral convexity, falx, and tentorium measuring up to 14 mm. Associated mass effect with 6 mm left-to-right midline shift. No herniation. Critical Value/emergent results were called by telephone at the time of interpretation on 03/26/2017 at 6:13 pm to Dr. Jacalyn Lefevre , who verbally acknowledged these results. Electronically Signed   By: Mitzi Hansen M.D.   On: 03/26/2017 18:15     STUDIES:  CT head 1/14 > acute left SDH measuring up to 14mm with 6mm L to R MLS.  CULTURES: None.  ANTIBIOTICS: None.  SIGNIFICANT EVENTS: 1/14 > admitted, taken to OR for left sided craniotomy.  LINES/TUBES: R radial A line 1/14 >   DISCUSSION: 82 y.o. male admitted 03/26/17 with hypertensive emergency and left SDH.  Taken to OR for emergent craniotomy and PCCM asked to help with medical management.  ASSESSMENT / PLAN:  PULMONARY A: A: No acute issues. P: No interventions required.  CARDIOVASCULAR A:  Hypertensive emergency - highest BP 184 / 104. Hx HTN, HLD. P:  Start cardene gtt, goal SBP ~ 140. Labetalol PRN. Continue preadmission rosuvastatin, amlodipine, benicar.  RENAL A:   AoCKD. P:   NS @ 50. BMP in AM.  GASTROINTESTINAL A:    Nutrition. P:   NPO for now.  HEMATOLOGIC A:   VTE Prophylaxis. P:  SCD's only. CBC in AM.  INFECTIOUS A:   No indication of infection. P:   Monitor clinically.  ENDOCRINE A:   No acute issues.   P:   No interventions required.  NEUROLOGIC A:   Acute encephalopathy - due to left sided SDH, s/p emergent craniotomy 1/14 (Dr. Wynetta Emery). P:   Post op care per neurosurgery. Continue decadron, keppra per neurosurgery.  Family updated: None available.  Interdisciplinary Family Meeting v Palliative Care Meeting:  Due by: 04/03/17.   Rutherford Guys, [place] - [facility] Pager: [phone]  or [phone] 03/27/2017, 1:20 AM

## 2017-03-26 NOTE — H&P (Signed)
Juan Goodwin is an 82 y.o. male.   Chief Complaint: Subdural hematoma HPI: 82 year old gentleman who was found around 2:00 today markedly confused with having difficulty with his speech. No known fall or trauma but the patient apparently was complaining to another family member about headache yesterday. Patient was evaluated in emergency room underwent CT of his head which showed a large left-sided acute subdural hematoma.  Past Medical History:  Diagnosis Date  . Hypertension   . Renal insufficiency     Past Surgical History:  Procedure Laterality Date  . ESOPHAGOGASTRODUODENOSCOPY N/A 07/11/2014   Procedure: ESOPHAGOGASTRODUODENOSCOPY (EGD);  Surgeon: Wilford Corner, MD;  Location: [facility];  Service: Endoscopy;  Laterality: N/A;    History reviewed. No pertinent family history. Social History:  reports that  has never smoked. he has never used smokeless tobacco. He reports that he does not drink alcohol. His drug history is not on file.  Allergies: No Known Allergies   (Not in a hospital admission)  Results for orders placed or performed during the hospital encounter of 03/26/17 (from the past 48 hour(s))  CBG monitoring, ED     Status: Abnormal   Collection Time: 03/26/17  4:57 PM  Result Value Ref Range   Glucose-Capillary 147 (H) 65 - 99 mg/dL  Protime-INR     Status: None   Collection Time: 03/26/17  5:01 PM  Result Value Ref Range   Prothrombin Time 13.8 11.4 - 15.2 seconds   INR 1.07   APTT     Status: None   Collection Time: 03/26/17  5:01 PM  Result Value Ref Range   aPTT 36 24 - 36 seconds  CBC     Status: None   Collection Time: 03/26/17  5:01 PM  Result Value Ref Range   WBC 6.5 4.0 - 10.5 K/uL   RBC 5.10 4.22 - 5.81 MIL/uL   Hemoglobin 13.7 13.0 - 17.0 g/dL   HCT 41.5 39.0 - 52.0 %   MCV 81.4 78.0 - 100.0 fL   MCH 26.9 26.0 - 34.0 pg   MCHC 33.0 30.0 - 36.0 g/dL   RDW 15.0 11.5 - 15.5 %   Platelets 194 150 - 400 K/uL  Differential     Status:  None   Collection Time: 03/26/17  5:01 PM  Result Value Ref Range   Neutrophils Relative % 61 %   Neutro Abs 4.1 1.7 - 7.7 K/uL   Lymphocytes Relative 26 %   Lymphs Abs 1.7 0.7 - 4.0 K/uL   Monocytes Relative 9 %   Monocytes Absolute 0.6 0.1 - 1.0 K/uL   Eosinophils Relative 3 %   Eosinophils Absolute 0.2 0.0 - 0.7 K/uL   Basophils Relative 1 %   Basophils Absolute 0.0 0.0 - 0.1 K/uL  Comprehensive metabolic panel     Status: Abnormal   Collection Time: 03/26/17  5:01 PM  Result Value Ref Range   Sodium 136 135 - 145 mmol/L   Potassium 4.0 3.5 - 5.1 mmol/L   Chloride 102 101 - 111 mmol/L   CO2 22 22 - 32 mmol/L   Glucose, Bld 187 (H) 65 - 99 mg/dL   BUN 27 (H) 6 - 20 mg/dL   Creatinine, Ser 1.78 (H) 0.61 - 1.24 mg/dL   Calcium 9.3 8.9 - 10.3 mg/dL   Total Protein 7.6 6.5 - 8.1 g/dL   Albumin 3.8 3.5 - 5.0 g/dL   AST 18 15 - 41 U/L   ALT 10 (L) 17 - 63  U/L   Alkaline Phosphatase 84 38 - 126 U/L   Total Bilirubin 1.1 0.3 - 1.2 mg/dL   GFR calc non Af Amer 35 (L) >60 mL/min   GFR calc Af Amer 40 (L) >60 mL/min    Comment: (NOTE) The eGFR has been calculated using the CKD EPI equation. This calculation has not been validated in all clinical situations. eGFR's persistently <60 mL/min signify possible Chronic Kidney Disease.    Anion gap 12 5 - 15  I-stat troponin, ED     Status: None   Collection Time: 03/26/17  5:17 PM  Result Value Ref Range   Troponin i, poc 0.01 0.00 - 0.08 ng/mL   Comment 3            Comment: Due to the release kinetics of cTnI, a negative result within the first hours of the onset of symptoms does not rule out myocardial infarction with certainty. If myocardial infarction is still suspected, repeat the test at appropriate intervals.   I-Stat Chem 8, ED     Status: Abnormal   Collection Time: 03/26/17  5:19 PM  Result Value Ref Range   Sodium 139 135 - 145 mmol/L   Potassium 4.2 3.5 - 5.1 mmol/L   Chloride 102 101 - 111 mmol/L   BUN 30 (H) 6  - 20 mg/dL   Creatinine, Ser 1.60 (H) 0.61 - 1.24 mg/dL   Glucose, Bld 193 (H) 65 - 99 mg/dL   Calcium, Ion 1.20 1.15 - 1.40 mmol/L   TCO2 27 22 - 32 mmol/L   Hemoglobin 15.3 13.0 - 17.0 g/dL   HCT 45.0 39.0 - 52.0 %   Ct Head Wo Contrast  Result Date: 03/26/2017 CLINICAL DATA:  82 y/o  M; altered level of consciousness. EXAM: CT HEAD WITHOUT CONTRAST TECHNIQUE: Contiguous axial images were obtained from the base of the skull through the vertex without intravenous contrast. COMPARISON:  None. FINDINGS: Brain: Acute subdural hematoma over the left cerebral convexity, left falx, and left tentorium cerebelli. Hematoma measures up to 14 mm in the lateral frontal region. Mass effect from the hematoma effaces sulci, partially effaces the left lateral ventricle, and results and 6 mm of left-to-right midline shift. No large acute stroke, brain parenchymal hemorrhage, or focal mass effect of brain parenchyma identified. No downward herniation or effacement of basilar cisterns. Vascular: Calcific atherosclerosis of carotid siphons. No hyperdense vessel. Skull: Normal. Negative for fracture or focal lesion. Sinuses/Orbits: Left maxillary sinus mucous retention cyst and mild mucosal thickening of ethmoid air cells. Otherwise negative. Other: None. IMPRESSION: Acute subdural hematoma of left cerebral convexity, falx, and tentorium measuring up to 14 mm. Associated mass effect with 6 mm left-to-right midline shift. No herniation. Critical Value/emergent results were called by telephone at the time of interpretation on 03/26/2017 at 6:13 pm to Dr. Isla Pence , who verbally acknowledged these results. Electronically Signed   By: Kristine Garbe M.D.   On: 03/26/2017 18:15    ROS  Blood pressure (!) 165/112, pulse 71, temperature 97.7 F (36.5 C), temperature source Oral, resp. rate 16, SpO2 100 %. Physical Exam   Assessment/Plan 82 year old gentleman with a 1.5 cm acute subdural hematoma with 6 mm  midline shift. Patient appears to be generally healthy we are not aware of any blood thinners or conservative factors to his subdural. I did discuss with the patient's brother-in-law as well as patient's sister and the patient's daughter about the pathology the subdural hematoma and the need for emergent craniotomy. I  extensively went over the risks and benefits of the operation with daughter as well as perioperative course expectations of outcome and alternatives surgery and he understands and agrees to proceed forward. The daughter currently resides in [place] her name is Minna Merritts and she can be reached at [phone].  Ilay Capshaw P, MD 03/26/2017, 7:57 PM

## 2017-03-26 NOTE — ED Triage Notes (Addendum)
Pt brought in by family for confusion. Family states patient was at baseline Friday and Saturday but now seems confused and has trouble word finding. Pt disoriented X4 but states he feels fine. Pt follows commands and no unilateral weakness noted. Unknown LSN.

## 2017-03-26 NOTE — ED Notes (Signed)
ED Provider at bedside. 

## 2017-03-26 NOTE — Op Note (Addendum)
Preoperative diagnosis: Acute left-sided subdural hematoma Postoperative diagnosis: Same  Procedure: Left-sided pterional craniotomy for evacuation of an acute left-sided subdural hematoma  Surgeon: Jillyn HiddenGary Tamme Mozingo  Asst.: Verlin DikeKimberly Meyran  Anesthesia: Gen.  EBL: Minimal  History of present illness: 82 year old gentleman who was found confused earlier was brought to the emergency room CT scan showed a large left acute subdural hematoma patient was recommended emergent craniotomy I had extensive conversations with multiple family members to include brother-in-law sister and daughter. Risks and benefits as well as perioperative course expectations of outcome and alternatives surgery were all spine the patient and family understood and agreed to proceed forward.  Operative procedure: Patient brought into the or was just on general anesthesia positioned supine with a shoulder bump under his left shoulder his head turned the right side left side of his head was shaved prepped and draped in routine sterile fashion. A pterional incision was drawn out after infiltration of 10 mL lidocaine with epi pterional craniotomy was turned with bur holes in the infratemporal fossa keyhole and posterior parietal. Upon removal of bone flap the dura was densely adherent to the bone and so therefore the dura was partially lacerated. However large acute subdural was immediate identified I was able to coagulate some cortical bleeders on the surface of the left frontal lobe then irrigated and removed a large clot to 360 orientation. There is no more active bleeders visualized. I reconstructed the dura with dural matrix and Gelfoam reattached the bone flap with the Lorenz plating system prior to reattachment of the bone plug the brain was pulsatile and was still slack. I did place a J-P drain prior to dural closure. Then closed the temporalis with interrupted Vicryl closed the scalp with interrupted Vicryl skin staples. Patient  recovered in stable condition. The case all needle counts sponge counts were correct.

## 2017-03-26 NOTE — Anesthesia Preprocedure Evaluation (Addendum)
Anesthesia Evaluation  Patient identified by MRN, date of birth, ID band Patient awake    Reviewed: Allergy & Precautions, NPO status , Patient's Chart, lab work & pertinent test results  History of Anesthesia Complications Negative for: history of anesthetic complications  Airway Mallampati: II  TM Distance: >3 FB Neck ROM: Full    Dental  (+) Dental Advisory Given   Pulmonary neg pulmonary ROS,    breath sounds clear to auscultation       Cardiovascular hypertension, Pt. on medications (-) angina Rhythm:Regular Rate:Normal     Neuro/Psych Confusion, aphasia today: 1.5 cm acute subdural hematoma with 6 mm midline shift.    GI/Hepatic Neg liver ROS, GERD  Medicated and Controlled,  Endo/Other  negative endocrine ROS  Renal/GU Renal InsufficiencyRenal disease (creat 1.60)     Musculoskeletal   Abdominal   Peds  Hematology negative hematology ROS (+)   Anesthesia Other Findings   Reproductive/Obstetrics                            Anesthesia Physical Anesthesia Plan  ASA: III and emergent  Anesthesia Plan: General   Post-op Pain Management:    Induction: Intravenous  PONV Risk Score and Plan: 2 and Ondansetron, Dexamethasone and Treatment may vary due to age or medical condition  Airway Management Planned: Oral ETT  Additional Equipment: Arterial line  Intra-op Plan:   Post-operative Plan: Possible Post-op intubation/ventilation  Informed Consent:   Only emergency history available, Dental advisory given and History available from chart only  Plan Discussed with: CRNA and Surgeon  Anesthesia Plan Comments: (Plan routine monitors, A line, GETA with possible post op vent)       Anesthesia Quick Evaluation

## 2017-03-26 NOTE — Anesthesia Procedure Notes (Signed)
Arterial Line Insertion Start/End1/14/2019 9:00 PM, 03/26/2017 9:10 PM Performed by: Lucinda Dellecarlo, Guiselle Mian M, CRNA, CRNA  Patient location: Pre-op. Preanesthetic checklist: patient identified, IV checked, site marked, risks and benefits discussed, surgical consent, monitors and equipment checked and pre-op evaluation Lidocaine 1% used for infiltration Right, radial was placed Catheter size: 20 G Hand hygiene performed  and maximum sterile barriers used  Allen's test indicative of satisfactory collateral circulation Attempts: 1 Procedure performed without using ultrasound guided technique. Following insertion, dressing applied and Biopatch. Post procedure assessment: normal  Patient tolerated the procedure well with no immediate complications.

## 2017-03-26 NOTE — ED Provider Notes (Signed)
[facility] Provider Note   CSN: [id number] Arrival date & time: 03/26/17  1649     History   Chief Complaint Chief Complaint  Patient presents with  . Altered Mental Status    HPI Juan Goodwin is a 82 y.o. male.  HPI Patient's brother-in-law is the main historian.  He reports that they talk every day.  He reports that yesterday he did not seem quite right.  He had been seen by his sister and she had reported that he seemed more tired than usual and somewhat confused.  Today when he checked on him, he reports that he was in bed in the afternoon which is very unusual.  He reports that he was not speaking normally and seemed confused.  He reports there was no signs of injury or disarray in the patient's home.  The patient himself, is awake and alert.  He has significant expressive a aphasia.  He denies any pain.  When asked questions, he tries to answer but can mostly only say "well, there is a lot of.... You know".  He can only start a sentence and then has to and it by saying "you know".  He does affirm that he knows what he wants to say but just cannot produce the words.  He is denying any pain.  Patient's brother-in-law reports that he had not been sick or ill leading up to this. Past Medical History:  Diagnosis Date  . Hypertension   . Renal insufficiency     Patient Active Problem List   Diagnosis Date Noted  . Acute pyloric channel ulcer 07/13/2014  . Hypokalemia 07/13/2014  . Acute upper GI bleed 07/13/2014  . ARF (acute renal failure) (HCC) 07/13/2014  . Hypernatremia 07/13/2014  . Hypotension 07/13/2014  . Thrombocytopenia (HCC) 07/13/2014  . Helicobacter pylori gastritis 07/13/2014  . Gastrointestinal hemorrhage with melena   . Melena 07/11/2014  . GI bleed 07/11/2014  . Acute blood loss anemia 07/10/2014    Past Surgical History:  Procedure Laterality Date  . ESOPHAGOGASTRODUODENOSCOPY N/A 07/11/2014   Procedure:  ESOPHAGOGASTRODUODENOSCOPY (EGD);  Surgeon: Charlott RakesVincent Schooler, MD;  [facility];  Service: Endoscopy;  Laterality: N/A;       Home Medications    Prior to Admission medications   Medication Sig Start Date End Date Taking? Authorizing Provider  amLODipine (NORVASC) 10 MG tablet Take 10 mg by mouth at bedtime. 01/03/17   [provider]  amoxicillin-clarithromycin-lansoprazole [facility](PREVPAC) combo pack Take by mouth 2 (two) times daily. Follow package directions. 07/13/14   Rodolph Bonghompson, Daniel V, MD  loratadine (CLARITIN) 10 MG tablet Take 10 mg by mouth as needed for allergies.    [provider]  olmesartan-hydrochlorothiazide (BENICAR HCT) 40-12.5 MG tablet Take 1 tablet by mouth daily. 03/05/17   [provider]  pantoprazole (PROTONIX) 40 MG tablet Take 1 tablet (40 mg total) by mouth 2 (two) times daily. TAKE 1 TABLET 2 TIMES DAILY X 3 MONTHS THEN 1 TABLET DAILY AFTER. 07/27/14   Rodolph Bonghompson, Daniel V, MD  paricalcitol (ZEMPLAR) 1 MCG capsule Take 1 mcg by mouth daily. 05/26/14   [provider]  rosuvastatin (CRESTOR) 10 MG tablet Take 10 mg by mouth daily. 12/19/16   [provider]  simvastatin (ZOCOR) 40 MG tablet Take 40 mg by mouth at bedtime.    [provider]    Family History History reviewed. No pertinent family history.  Social History Social History   Tobacco Use  .  Smoking status: Never Smoker  . Smokeless tobacco: Never Used  Substance Use Topics  . Alcohol use: No  . Drug use: Not on file     Allergies   Patient has no known allergies.   Review of Systems Review of Systems Level 5 caveat patient confusion cannot obtain full review of systems.  Physical Exam Updated Vital Signs BP (!) 165/112   Pulse 71   Temp 97.7 F (36.5 C) (Oral)   Resp 16   SpO2 100%   Physical Exam  Constitutional: He appears well-developed and well-nourished. No distress.  Patient is alert.  No respiratory distress.   Follows commands.  HENT:  Head: Normocephalic and atraumatic.  Nose: Nose normal.  Mouth/Throat: Oropharynx is clear and moist.  No contusions or abrasions to the head.  Face normocephalic atraumatic.  Eyes: EOM are normal. Pupils are equal, round, and reactive to light.  Neck: Neck supple.  No C-spine tenderness to palpation.  Cardiovascular: Normal rate, regular rhythm and normal heart sounds.  Pulmonary/Chest: Effort normal and breath sounds normal.  Abdominal: Soft. He exhibits no distension. There is no tenderness. There is no guarding.  Musculoskeletal: Normal range of motion. He exhibits no edema, tenderness or deformity.  Patient follows commands to hold both arms out in attention to test pronator drift.  He does not have any areas of deformity or expression of pain.  He has good range of motion.  We will also elevate both leg independently off of the bed and hold.  Neurological:  Patient is alert.  He is following commands appropriately.  He has significant expressive aphasia.  He can begin a sentence but cannot completed.  He answers accurately in the affirmative and the negative.  No pronator drift.  Patient seem to have slight decreased grip on the left but marginally so.  He could elevate each leg off the bed and hold against resistance.  Skin: Skin is warm and dry.  Psychiatric: He has a normal mood and affect.  Patient is calm and cooperative.    ED Treatments / Results  Labs (all labs ordered are listed, but only abnormal results are displayed) Labs Reviewed  COMPREHENSIVE METABOLIC PANEL - Abnormal; Notable for the following components:      Result Value   Glucose, Bld 187 (*)    BUN 27 (*)    Creatinine, Ser 1.78 (*)    ALT 10 (*)    GFR calc non Af Amer 35 (*)    GFR calc Af Amer 40 (*)    All other components within normal limits  CBG MONITORING, ED - Abnormal; Notable for the following components:   Glucose-Capillary 147 (*)    All other components within  normal limits  I-STAT CHEM 8, ED - Abnormal; Notable for the following components:   BUN 30 (*)    Creatinine, Ser 1.60 (*)    Glucose, Bld 193 (*)    All other components within normal limits  PROTIME-INR  APTT  CBC  DIFFERENTIAL  I-STAT TROPONIN, ED    EKG  EKG Interpretation  Date/Time:  Monday March 26 2017 17:00:28 EST Ventricular Rate:  86 PR Interval:  152 QRS Duration: 90 QT Interval:  368 QTC Calculation: 440 R Axis:   -35 Text Interpretation:  Normal sinus rhythm Left axis deviation Pulmonary disease pattern Septal infarct , age undetermined Abnormal ECG Confirmed by Arby Barrette [phone]) on 03/26/2017 7:12:24 PM       Radiology Ct Head Wo Contrast  Result  Date: 03/26/2017 CLINICAL DATA:  82 y/o  M; altered level of consciousness. EXAM: CT HEAD WITHOUT CONTRAST TECHNIQUE: Contiguous axial images were obtained from the base of the skull through the vertex without intravenous contrast. COMPARISON:  None. FINDINGS: Brain: Acute subdural hematoma over the left cerebral convexity, left falx, and left tentorium cerebelli. Hematoma measures up to 14 mm in the lateral frontal region. Mass effect from the hematoma effaces sulci, partially effaces the left lateral ventricle, and results and 6 mm of left-to-right midline shift. No large acute stroke, brain parenchymal hemorrhage, or focal mass effect of brain parenchyma identified. No downward herniation or effacement of basilar cisterns. Vascular: Calcific atherosclerosis of carotid siphons. No hyperdense vessel. Skull: Normal. Negative for fracture or focal lesion. Sinuses/Orbits: Left maxillary sinus mucous retention cyst and mild mucosal thickening of ethmoid air cells. Otherwise negative. Other: None. IMPRESSION: Acute subdural hematoma of left cerebral convexity, falx, and tentorium measuring up to 14 mm. Associated mass effect with 6 mm left-to-right midline shift. No herniation. Critical Value/emergent results were called by  telephone at the time of interpretation on 03/26/2017 at 6:13 pm to Dr. Jacalyn Lefevre , who verbally acknowledged these results. Electronically Signed   By: Mitzi Hansen M.D.   On: 03/26/2017 18:15    Procedures Procedures (including critical care time)  Medications Ordered in ED Medications - No data to display   Initial Impression / Assessment and Plan / ED Course  I have reviewed the triage vital signs and the nursing notes.  Pertinent labs & imaging results that were available during my care of the patient were reviewed by me and considered in my medical decision making (see chart for details).     (19: 05) Reviewed with neurosurgery, will review findings with Dr. Wynetta Emery and advise.  Final Clinical Impressions(s) / ED Diagnoses   Final diagnoses:  Subdural hematoma (HCC)  Expressive aphasia   History and timing of the patient's symptoms is somewhat equivocal.  Initial note by screening PA-C Hedges indicates the patient's brother-in-law said that he was normal yesterday evening.  From my history however, the patient was unusually drowsy and not quite himself yesterday.  There is no trauma history.  The patient cannot describe exactly when or what happened.  Physically there are no objective signs of trauma.  The patient's brother-in-law reports the house did not appear to have any thing out of place and the patient was found lying in his bed.  At this time, his mental status is alert and following commands appropriately.  He however has significant expressive aphasia and CT positive subdural hematoma with midline shift.  Neurosurgery consulted for definitive management. ED Discharge Orders    None       Arby Barrette, MD 03/26/17 [phone]

## 2017-03-27 ENCOUNTER — Inpatient Hospital Stay (HOSPITAL_COMMUNITY): Payer: Medicare HMO

## 2017-03-27 ENCOUNTER — Encounter (HOSPITAL_COMMUNITY): Payer: Self-pay | Admitting: Neurosurgery

## 2017-03-27 DIAGNOSIS — R4701 Aphasia: Secondary | ICD-10-CM | POA: Clinically undetermined

## 2017-03-27 DIAGNOSIS — I951 Orthostatic hypotension: Secondary | ICD-10-CM

## 2017-03-27 HISTORY — DX: Orthostatic hypotension: I95.1

## 2017-03-27 HISTORY — DX: Aphasia: R47.01

## 2017-03-27 LAB — CBC
HCT: 32.6 % — ABNORMAL LOW (ref 39.0–52.0)
HCT: 33.5 % — ABNORMAL LOW (ref 39.0–52.0)
Hemoglobin: 10.8 g/dL — ABNORMAL LOW (ref 13.0–17.0)
Hemoglobin: 10.9 g/dL — ABNORMAL LOW (ref 13.0–17.0)
MCH: 26.3 pg (ref 26.0–34.0)
MCH: 26.7 pg (ref 26.0–34.0)
MCHC: 32.5 g/dL (ref 30.0–36.0)
MCHC: 33.1 g/dL (ref 30.0–36.0)
MCV: 80.7 fL (ref 78.0–100.0)
MCV: 80.9 fL (ref 78.0–100.0)
Platelets: 153 10*3/uL (ref 150–400)
Platelets: 154 10*3/uL (ref 150–400)
RBC: 4.04 MIL/uL — ABNORMAL LOW (ref 4.22–5.81)
RBC: 4.14 MIL/uL — ABNORMAL LOW (ref 4.22–5.81)
RDW: 14.8 % (ref 11.5–15.5)
RDW: 14.9 % (ref 11.5–15.5)
WBC: 6.4 10*3/uL (ref 4.0–10.5)
WBC: 7.7 10*3/uL (ref 4.0–10.5)

## 2017-03-27 LAB — BASIC METABOLIC PANEL
Anion gap: 12 (ref 5–15)
Anion gap: 9 (ref 5–15)
BUN: 25 mg/dL — ABNORMAL HIGH (ref 6–20)
BUN: 24 mg/dL — ABNORMAL HIGH (ref 6–20)
CO2: 19 mmol/L — ABNORMAL LOW (ref 22–32)
CO2: 20 mmol/L — ABNORMAL LOW (ref 22–32)
Calcium: 8 mg/dL — ABNORMAL LOW (ref 8.9–10.3)
Calcium: 8.3 mg/dL — ABNORMAL LOW (ref 8.9–10.3)
Chloride: 107 mmol/L (ref 101–111)
Chloride: 109 mmol/L (ref 101–111)
Creatinine, Ser: 1.52 mg/dL — ABNORMAL HIGH (ref 0.61–1.24)
Creatinine, Ser: 1.43 mg/dL — ABNORMAL HIGH (ref 0.61–1.24)
GFR calc Af Amer: 48 mL/min — ABNORMAL LOW (ref >60)
GFR calc Af Amer: 52 mL/min — ABNORMAL LOW (ref >60)
GFR calc non Af Amer: 42 mL/min — ABNORMAL LOW (ref >60)
GFR calc non Af Amer: 45 mL/min — ABNORMAL LOW (ref >60)
Glucose, Bld: 144 mg/dL — ABNORMAL HIGH (ref 65–99)
Glucose, Bld: 176 mg/dL — ABNORMAL HIGH (ref 65–99)
Potassium: 3.8 mmol/L (ref 3.5–5.1)
Potassium: 4.1 mmol/L (ref 3.5–5.1)
Sodium: 138 mmol/L (ref 135–145)
Sodium: 138 mmol/L (ref 135–145)

## 2017-03-27 LAB — MRSA PCR SCREENING: MRSA by PCR: NEGATIVE

## 2017-03-27 LAB — MAGNESIUM: Magnesium: 1.9 mg/dL (ref 1.7–2.4)

## 2017-03-27 LAB — PHOSPHORUS: Phosphorus: 3.2 mg/dL (ref 2.5–4.6)

## 2017-03-27 MED ORDER — ROSUVASTATIN CALCIUM 10 MG PO TABS
10.0000 mg | ORAL_TABLET | Freq: Every day | ORAL | Status: DC
  Administered 2017-03-27 – 2017-03-29 (×3): 10 mg via ORAL
  Filled 2017-03-27 (×3): qty 1

## 2017-03-27 MED ORDER — LORATADINE 10 MG PO TABS: 10.0000 mg | ORAL_TABLET | Freq: Every day | ORAL | Status: DC | PRN

## 2017-03-27 MED ORDER — PANTOPRAZOLE SODIUM 40 MG PO TBEC: 40.0000 mg | DELAYED_RELEASE_TABLET | Freq: Two times a day (BID) | ORAL | Status: DC

## 2017-03-27 MED ORDER — ORAL CARE MOUTH RINSE
15.0000 mL | Freq: Two times a day (BID) | OROMUCOSAL | Status: DC
  Administered 2017-03-27 – 2017-03-28 (×3): 15 mL via OROMUCOSAL

## 2017-03-27 MED ORDER — HYDROMORPHONE HCL 1 MG/ML IJ SOLN: 0.5000 mg | INTRAMUSCULAR | Status: DC | PRN

## 2017-03-27 MED ORDER — ONDANSETRON HCL 4 MG/2ML IJ SOLN: 4.0000 mg | INTRAMUSCULAR | Status: DC | PRN

## 2017-03-27 MED ORDER — OLMESARTAN MEDOXOMIL-HCTZ 40-12.5 MG PO TABS: 1.0000 | ORAL_TABLET | Freq: Every day | ORAL | Status: DC

## 2017-03-27 MED ORDER — ONDANSETRON HCL 4 MG PO TABS: 4.0000 mg | ORAL_TABLET | ORAL | Status: DC | PRN

## 2017-03-27 MED ORDER — PARICALCITOL 1 MCG PO CAPS
1.0000 ug | ORAL_CAPSULE | Freq: Every day | ORAL | Status: DC
  Administered 2017-03-27 – 2017-03-30 (×4): 1 ug via ORAL
  Filled 2017-03-27 (×5): qty 1

## 2017-03-27 MED ORDER — DOCUSATE SODIUM 100 MG PO CAPS
100.0000 mg | ORAL_CAPSULE | Freq: Two times a day (BID) | ORAL | Status: DC
  Administered 2017-03-27 – 2017-03-30 (×7): 100 mg via ORAL
  Filled 2017-03-27 (×7): qty 1

## 2017-03-27 MED ORDER — POTASSIUM CHLORIDE IN NACL 20-0.9 MEQ/L-% IV SOLN
INTRAVENOUS | Status: DC
  Administered 2017-03-27 – 2017-03-30 (×4): via INTRAVENOUS
  Filled 2017-03-27 (×4): qty 1000

## 2017-03-27 MED ORDER — PANTOPRAZOLE SODIUM 40 MG IV SOLR
40.0000 mg | Freq: Every day | INTRAVENOUS | Status: DC
  Administered 2017-03-27 – 2017-03-29 (×3): 40 mg via INTRAVENOUS
  Filled 2017-03-27 (×3): qty 40

## 2017-03-27 MED ORDER — CEFAZOLIN SODIUM-DEXTROSE 2-4 GM/100ML-% IV SOLN
2.0000 g | Freq: Three times a day (TID) | INTRAVENOUS | Status: AC
  Administered 2017-03-27 (×2): 2 g via INTRAVENOUS
  Filled 2017-03-27 (×2): qty 100

## 2017-03-27 MED ORDER — WHITE PETROLATUM EX OINT
TOPICAL_OINTMENT | CUTANEOUS | Status: AC
  Administered 2017-03-27: 02:00:00
  Filled 2017-03-27: qty 28.35

## 2017-03-27 MED ORDER — NICARDIPINE HCL IN NACL 20-0.86 MG/200ML-% IV SOLN
3.0000 mg/h | INTRAVENOUS | Status: DC
Start: 2017-03-27 — End: 2017-03-30
  Administered 2017-03-27 (×2): 5 mg/h via INTRAVENOUS
  Filled 2017-03-27 (×2): qty 200

## 2017-03-27 MED ORDER — AMLODIPINE BESYLATE 10 MG PO TABS
10.0000 mg | ORAL_TABLET | Freq: Every day | ORAL | Status: DC
  Administered 2017-03-27 – 2017-03-29 (×3): 10 mg via ORAL
  Filled 2017-03-27 (×3): qty 1

## 2017-03-27 MED ORDER — PROMETHAZINE HCL 12.5 MG PO TABS
12.5000 mg | ORAL_TABLET | ORAL | Status: DC | PRN
  Filled 2017-03-27: qty 2

## 2017-03-27 MED ORDER — IRBESARTAN 300 MG PO TABS
300.0000 mg | ORAL_TABLET | Freq: Every day | ORAL | Status: DC
  Administered 2017-03-27 – 2017-03-30 (×4): 300 mg via ORAL
  Filled 2017-03-27 (×3): qty 2
  Filled 2017-03-27: qty 1

## 2017-03-27 MED ORDER — HYDROCHLOROTHIAZIDE 12.5 MG PO CAPS
12.5000 mg | ORAL_CAPSULE | Freq: Every day | ORAL | Status: DC
  Administered 2017-03-27 – 2017-03-30 (×4): 12.5 mg via ORAL
  Filled 2017-03-27 (×4): qty 1

## 2017-03-27 NOTE — Evaluation (Signed)
Physical Therapy Evaluation Patient Details Name: Juan Goodwin MRN: [id number] DOB: May 29, 1935 Today's Date: 03/27/2017   History of Present Illness  82 yo admitted with altered speech with left SDH s/p crani 1/14 with aphasia. PMHx: HTN, renal insufficiency  Clinical Impression  Pt moving well and able to follow commands for 90% of mobility. Pt able to perform basic transfers and move all extremities but could not maintain static hold and unclear if due to cognition or strength. Pt with orthostatic hypotension limiting mobility this session and no family present to confirm PLOF or home setup which will need to be further addressed. Pt with decreased cognition, activity tolerance and function who will benefit from acute therapy to maximize mobility, safety and function to decrease burden of care.   BP supine 135/76 Initial stand 78/62, repeat BP 63/53 Return to sitting 117/74 Additional stand 84/48 Return to supine 123/72    Follow Up Recommendations Home health PT;Supervision/Assistance - 24 hour    Equipment Recommendations  None recommended by PT    Recommendations for Other Services       Precautions / Restrictions Precautions Precautions: Fall Precaution Comments: watch BP      Mobility  Bed Mobility Overal bed mobility: Needs Assistance Bed Mobility: Supine to Sit;Sit to Supine     Supine to sit: Supervision Sit to supine: Supervision   General bed mobility comments: supervision for lines and cues for sequence  Transfers Overall transfer level: Needs assistance   Transfers: Sit to/from Stand Sit to Stand: Supervision         General transfer comment: cues to initiate and perform. Pt agreeing to dizziness in standing with BP dropping in standing x 3 attempts. RN present and pt returned to bed due to orthostatics  Ambulation/Gait             General Gait Details: unable at this time  Stairs            Wheelchair Mobility    Modified Rankin  (Stroke Patients Only) Modified Rankin (Stroke Patients Only) Pre-Morbid Rankin Score: No symptoms Modified Rankin: Moderate disability     Balance Overall balance assessment: No apparent balance deficits (not formally assessed)                                           Pertinent Vitals/Pain Pain Assessment: No/denies pain    Home Living Family/patient expects to be discharged to:: Private residence Living Arrangements: Alone               Additional Comments: unsure as pt with expressive aphasia and no family present. Pt agreeing that he lives alone and was independent    Prior Function                 Hand Dominance        Extremity/Trunk Assessment   Upper Extremity Assessment Upper Extremity Assessment: Overall WFL for tasks assessed    Lower Extremity Assessment Lower Extremity Assessment: Overall WFL for tasks assessed    Cervical / Trunk Assessment Cervical / Trunk Assessment: Normal  Communication   Communication: Expressive difficulties  Cognition Arousal/Alertness: Awake/alert Behavior During Therapy: Flat affect Overall Cognitive Status: Difficult to assess Area of Impairment: Following commands                       Following Commands: Follows one step commands  consistently       General Comments: pt able to follow commands verbal and visual for movement except holding a position      General Comments General comments (skin integrity, edema, etc.): pt with good sitting balance and able to maintain static standing without UE support x 3 min    Exercises     Assessment/Plan    PT Assessment Patient needs continued PT services  PT Problem List Decreased safety awareness;Decreased mobility;Decreased coordination;Decreased cognition;Decreased activity tolerance       PT Treatment Interventions Gait training;Therapeutic exercise;Patient/family education;Stair training;Balance training;Functional mobility  training;DME instruction;Therapeutic activities;Cognitive remediation    PT Goals (Current goals can be found in the Care Plan section)  Acute Rehab PT Goals Patient Stated Goal: return home PT Goal Formulation: With patient Time For Goal Achievement: 04/10/17 Potential to Achieve Goals: Good    Frequency Min 3X/week   Barriers to discharge Decreased caregiver support unsure of family support    Co-evaluation               AM-PAC PT "6 Clicks" Daily Activity  Outcome Measure Difficulty turning over in bed (including adjusting bedclothes, sheets and blankets)?: None Difficulty moving from lying on back to sitting on the side of the bed? : None Difficulty sitting down on and standing up from a chair with arms (e.g., wheelchair, bedside commode, etc,.)?: None Help needed moving to and from a bed to chair (including a wheelchair)?: A Little Help needed walking in hospital room?: A Little Help needed climbing 3-5 steps with a railing? : A Little 6 Click Score: 21    End of Session Equipment Utilized During Treatment: Gait belt Activity Tolerance: Patient tolerated treatment well Patient left: in bed;with bed alarm set;with nursing/sitter in room Nurse Communication: Mobility status PT Visit Diagnosis: Other abnormalities of gait and mobility (R26.89)    Time: [id number] PT Time Calculation (min) (ACUTE ONLY): 20 min   Charges:   PT Evaluation $PT Eval Moderate Complexity: 1 Mod     PT G Codes:        Delaney MeigsMaija Tabor Lenix Kidd, PT [phone]   Deaira Leckey B Zowie Lundahl 03/27/2017, 9:29 AM

## 2017-03-27 NOTE — Progress Notes (Signed)
Late entry:  D; Called Dr Wynetta Emeryram for BP stayed high after 20mg  labetalolol, ordered cardine gtt keep SBP 160-170, but wanted call critical care team for better tx. Also notified JP drain 105cc in 1 hr.  Dr Vaughan BastaSummer critical team called back cardene gtt orderd, awating from pharmacy, transferred to 4N20 via bed and monitor.

## 2017-03-27 NOTE — Progress Notes (Addendum)
Subjective: Patient still has expressive aphasia.   Objective: Vital signs in last 24 hours: Temp:  [97.5 F (36.4 C)-97.9 F (36.6 C)] 97.9 F (36.6 C) (01/15 0500) Pulse Rate:  [49-91] 66 (01/15 0700) Resp:  [6-40] 22 (01/15 0700) BP: (115-183)/(65-119) 115/65 (01/15 0700) SpO2:  [74 %-100 %] 96 % (01/15 0700) Arterial Line BP: (141-226)/(48-91) 145/68 (01/15 0700) Weight:  [80.8 kg (178 lb 2.1 oz)] 80.8 kg (178 lb 2.1 oz) (01/15 0500)  Intake/Output from previous day: 01/14 0701 - 01/15 0700 In: 1850 [I.V.:1600; IV Piggyback:250] Out: 1090 [Urine:550; Drains:240; Blood:300] Intake/Output this shift: No intake/output data recorded.  Neurologic: Alert and able to [facility]. Still having expressive aphasia.  Lab Results: Lab Results  Component Value Date   WBC 7.7 03/27/2017   HGB 10.9 (L) 03/27/2017   HCT 33.5 (L) 03/27/2017   MCV 80.9 03/27/2017   PLT 154 03/27/2017   Lab Results  Component Value Date   INR 1.07 03/26/2017   BMET Lab Results  Component Value Date   NA 138 03/27/2017   K 4.1 03/27/2017   CL 109 03/27/2017   CO2 20 (L) 03/27/2017   GLUCOSE 176 (H) 03/27/2017   BUN 24 (H) 03/27/2017   CREATININE 1.43 (H) 03/27/2017   CALCIUM 8.3 (L) 03/27/2017    Studies/Results: Ct Head Wo Contrast  Result Date: 03/27/2017 CLINICAL DATA:  Follow-up subdural hematoma. EXAM: CT HEAD WITHOUT CONTRAST TECHNIQUE: Contiguous axial images were obtained from the base of the skull through the vertex without intravenous contrast. COMPARISON:  CT HEAD March 26, 2017 FINDINGS: BRAIN: Interval evacuation of LEFT subdural hematoma with extra-axial drain in place. 2 mm residual LEFT-to-RIGHT midline shift, decreased from 6 mm. Small volume LEFT extra-axial pneumocephalus and residual hematoma. 5 mm LEFT parafalcine dense subdural hematoma persists. New linear density LEFT cerebellum. Age indeterminate thalamus lacunar infarct, more conspicuous than prior CT though this may be  related to re-expansion. No acute large vascular territory infarct. Basal cisterns are patent. Subdural density within included cervical spine. VASCULAR: Tortuous, possibly ectatic carotid siphons with mild calcific atherosclerosis. Tortuous vertebral arteries. SKULL: Interval LEFT frontoparietal craniotomy with subjacent Surgicel. LEFT scalp soft tissue swelling and skin staples. SINUSES/ORBITS: The mastoid air-cells and included paranasal sinuses are well-aerated.The included ocular globes and orbital contents are non-suspicious. OTHER: None. IMPRESSION: 1. Interval evacuation LEFT subdural hematoma with small residual component, extra-axial drain in place. 2 mm residual LEFT-to-RIGHT midline shift. 2. Stable 5 mm LEFT falcotentorial subdural hematoma. Linear LEFT cerebellar developmental venous anomaly versus hemorrhage. 3. Probable subdural hematoma extension to cervical spine. 4. Age indeterminate LEFT thalamus lacunar infarct. Electronically Signed   By: Awilda Metro M.D.   On: 03/27/2017 05:39   Ct Head Wo Contrast  Result Date: 03/26/2017 CLINICAL DATA:  82 y/o  M; altered level of consciousness. EXAM: CT HEAD WITHOUT CONTRAST TECHNIQUE: Contiguous axial images were obtained from the base of the skull through the vertex without intravenous contrast. COMPARISON:  None. FINDINGS: Brain: Acute subdural hematoma over the left cerebral convexity, left falx, and left tentorium cerebelli. Hematoma measures up to 14 mm in the lateral frontal region. Mass effect from the hematoma effaces sulci, partially effaces the left lateral ventricle, and results and 6 mm of left-to-right midline shift. No large acute stroke, brain parenchymal hemorrhage, or focal mass effect of brain parenchyma identified. No downward herniation or effacement of basilar cisterns. Vascular: Calcific atherosclerosis of carotid siphons. No hyperdense vessel. Skull: Normal. Negative for fracture or focal lesion. Sinuses/Orbits: Left  maxillary sinus mucous retention cyst and mild mucosal thickening of ethmoid air cells. Otherwise negative. Other: None. IMPRESSION: Acute subdural hematoma of left cerebral convexity, falx, and tentorium measuring up to 14 mm. Associated mass effect with 6 mm left-to-right midline shift. No herniation. Critical Value/emergent results were called by telephone at the time of interpretation on 03/26/2017 at 6:13 pm to Dr. Jacalyn LefevreJULIE HAVILAND , who verbally acknowledged these results. Electronically Signed   By: Mitzi HansenLance  Furusawa-Stratton M.D.   On: 03/26/2017 18:15    Assessment/Plan: Continue to monitor in ICU. Drain put out 240 over night. Will order PT, OT, and ST. CT scan this morning stable with very small residual shift.    LOS: 1 day    Tiana LoftKimberly Hannah [facility]ran 03/27/2017, 7:47 AM   agree

## 2017-03-27 NOTE — Evaluation (Signed)
Clinical/Bedside Swallow Evaluation Patient Details  Name: Juan Goodwin MRN: [id number] Date of Birth: 07/02/1935  Today's Date: 03/27/2017 Time: SLP Start Time (ACUTE ONLY): 1040 SLP Stop Time (ACUTE ONLY): 1051 SLP Time Calculation (min) (ACUTE ONLY): 11 min  Past Medical History:  Past Medical History:  Diagnosis Date  . Hypertension   . Renal insufficiency    Past Surgical History:  Past Surgical History:  Procedure Laterality Date  . ESOPHAGOGASTRODUODENOSCOPY N/A 07/11/2014   Procedure: ESOPHAGOGASTRODUODENOSCOPY (EGD);  Surgeon: Charlott RakesVincent Schooler, MD;  [facility];  Service: Endoscopy;  Laterality: N/A;   HPI:  82 y.o. male admitted with confusion, changes in speech; CCT showed large left SDH.  Underwent left pterional craniotomy/evacuation 1/14.    Assessment / Plan / Recommendation Clinical Impression  Pt presents with a mild dysphagia with mild sensorimotor deficits and inattention right oral cavity, leading to mild residue and spillage.  Pt consumed three oz water with no overt s/s of aspiration.  Self fed cracker, purees, and water from straw with min verbal/tactile cues provided for feeding, but with overall adequate toleration.  Recommend resuming a regular consistency diet with thin liquids; assist with tray set-up.  Rec SLP f/u to address oral phase deficits.  Speech language evaluation pending.  SLP Visit Diagnosis: Dysphagia, unspecified (R13.10)    Aspiration Risk       Diet Recommendation   regular diet, thin liquids  Medication Administration: Whole meds with liquid    Other  Recommendations Oral Care Recommendations: Oral care BID   Follow up Recommendations None      Frequency and Duration            Prognosis        Swallow Study   General Date of Onset: 03/26/17 HPI: 82 y.o. male admitted with confusion, changes in speech; CCT showed large left SDH.  Underwent left pterional craniotomy/evacuation 1/14.  Type of Study: Bedside  Swallow Evaluation Previous Swallow Assessment: no Diet Prior to this Study: NPO Temperature Spikes Noted: No Respiratory Status: Room air History of Recent Intubation: Yes Length of Intubations (days): (for surgery) Behavior/Cognition: Alert Oral Cavity Assessment: Within Functional Limits Oral Care Completed by SLP: Recent completion by staff Oral Cavity - Dentition: Adequate natural dentition Vision: Functional for self-feeding Self-Feeding Abilities: Needs assist Patient Positioning: Upright in bed Baseline Vocal Quality: Normal Volitional Cough: Cognitively unable to elicit Volitional Swallow: Unable to elicit    Oral/Motor/Sensory Function Overall Oral Motor/Sensory Function: Mild impairment Facial ROM: Reduced right Facial Symmetry: Abnormal symmetry right Facial Strength: Reduced right Facial Sensation: Reduced right Lingual Symmetry: Within Functional Limits   Ice Chips Ice chips: Within functional limits   Thin Liquid Thin Liquid: Impaired Presentation: Cup;Straw Oral Phase Functional Implications: Right anterior spillage    Nectar Thick Nectar Thick Liquid: Not tested   Honey Thick Honey Thick Liquid: Not tested   Puree Puree: Impaired Presentation: Spoon;Self Fed Oral Phase Impairments: Reduced labial seal Oral Phase Functional Implications: Oral residue   Solid   GO   Solid: Within functional limits(mild inattention to right)        Blenda Mountsouture, Nashley Cordoba Laurice 03/27/2017,11:58 AM

## 2017-03-27 NOTE — Progress Notes (Signed)
eLink Physician-Brief Progress Note Patient Name: Juan MantleJimmy L Zubiate DOB: 1935-08-18 MRN: [id number]   Date of Service  03/27/2017  HPI/Events of Note  Hypertension - BP = 191/38 by A-line.  eICU Interventions  Will order: 1. Nicardipine IV infusion. Titrate to SBP < 170.     Intervention Category Major Interventions: Hypertension - evaluation and management  Jahkai Yandell Eugene 03/27/2017, 12:26 AM

## 2017-03-27 NOTE — Progress Notes (Signed)
[facility] Diseases & Critical [facility] Medicine Progress Note  Patient Name: Juan MantleJimmy L Goodwin MRN: [id number] DOB: November 19, 1935    ADMISSION DATE:  03/26/2017 CONSULTATION DATE:  03/26/2017  REFERRING MD:  Dr. Donalee CitrinGary Cram  REASON FOR CONSULTATION:  Subdural hematoma, postoperative care   ASSESSMENT/PLAN:  ASSESSMENT (included in the Hospital Problem List)  Principal Problem:   Subdural hematoma (HCC) Active Problems:   Global aphasia   Orthostatic hypotension    PLAN/RECOMMENDATIONS   HOB 30+ degrees at all times  IV fluids: NS + 20 mEq KCl @ 80 mL/hr   NUTRITION: NPO for now   DVT PROPHYLAXIS: SCDs  GI PROPHYLAXIS: Protonix  DISPOSITION: to the floor soon.    SUBJECTIVE:   -Interim events: extubated in the interim. Global aphasia. Does not follow commands. No definite lateralizing sign on motor exam.  Nurse reports that the patient has orthostatic hypotension that resolves promptly by returning to supine.  HISTORY OF PRESENT ILLNESS 82 year old male with past medical history significant for H. pylori, GI hemorrhage secondary to an acute pyloric ulcer, presented on 03/26/2017 with primary complaints of aphasia and confusion.  CT head identified large acute left subdural hematoma with midline shift.  Patient was taken for emergent craniotomy by neurosurgery. PCCM consulted for postop management.  REVIEW OF SYSTEMS: unobtainable   PAST MEDICAL/SURGICAL/SOCIAL/FAMILY HISTORY  Past Medical History:  Diagnosis Date  . Hypertension   . Renal insufficiency     Past Surgical History:  Procedure Laterality Date  . ESOPHAGOGASTRODUODENOSCOPY N/A 07/11/2014   Procedure: ESOPHAGOGASTRODUODENOSCOPY (EGD);  Surgeon: Charlott RakesVincent Schooler, MD;  [facility];  Service: Endoscopy;  Laterality: N/A;    Social History   Tobacco Use  . Smoking status: Never Smoker  . Smokeless tobacco: Never Used  Substance Use Topics  . Alcohol use: No    History  reviewed. No pertinent family history.  Prior to Admission medications   Medication Sig Start Date End Date Taking? Authorizing Provider  amLODipine (NORVASC) 10 MG tablet Take 10 mg by mouth at bedtime. 01/03/17  Yes [provider]  loratadine (CLARITIN) 10 MG tablet Take 10 mg by mouth daily as needed for allergies.    Yes [provider]  olmesartan-hydrochlorothiazide (BENICAR HCT) 40-12.5 MG tablet Take 1 tablet by mouth daily. 03/05/17  Yes [provider]  paricalcitol (ZEMPLAR) 1 MCG capsule Take 1 mcg by mouth daily. 05/26/14  Yes [provider]  rosuvastatin (CRESTOR) 10 MG tablet Take 10 mg by mouth daily. 12/19/16  Yes [provider]  pantoprazole (PROTONIX) 40 MG tablet Take 1 tablet (40 mg total) by mouth 2 (two) times daily. TAKE 1 TABLET 2 TIMES DAILY X 3 MONTHS THEN 1 TABLET DAILY AFTER. Patient not taking: Reported on 03/26/2017 07/27/14   Rodolph Bonghompson, Daniel V, MD     No Known Allergies    Current Facility-Administered Medications:  .  0.9 %  sodium chloride infusion, , Intravenous, Continuous, Jairo BenJackson, Carswell, MD .  0.9 % NaCl with KCl 20 mEq/ L  infusion, , Intravenous, Continuous, Desai, Rahul P, PA-C, Last Rate: 50 mL/hr at 03/27/17 0800 .  amLODipine (NORVASC) tablet 10 mg, 10 mg, Oral, QHS, Donalee Citrinram, Gary, MD .  ceFAZolin (ANCEF) IVPB 2g/100 mL premix, 2 g, Intravenous, Q8H, Donalee Citrinram, Gary, MD, Stopped at 03/27/17 0615 .  dexamethasone (DECADRON) injection 6 mg, 6 mg, Intravenous, Q6H, 6 mg at 03/27/17 0541 **FOLLOWED BY** [START ON 03/28/2017] dexamethasone (DECADRON) injection 4 mg, 4 mg, Intravenous, Q6H **FOLLOWED BY** [START ON  03/29/2017] dexamethasone (DECADRON) injection 4 mg, 4 mg, Intravenous, Q8H, Donalee Citrin, MD .  docusate sodium (COLACE) capsule 100 mg, 100 mg, Oral, BID, Donalee Citrin, MD .  irbesartan (AVAPRO) tablet 300 mg, 300 mg, Oral, Daily **AND** hydrochlorothiazide (MICROZIDE) capsule 12.5 mg, 12.5 mg, Oral, Daily,  Donalee Citrin, MD .  HYDROmorphone (DILAUDID) injection 0.5 mg, 0.5 mg, Intravenous, Q2H PRN, Donalee Citrin, MD .  labetalol (NORMODYNE,TRANDATE) injection 10-40 mg, 10-40 mg, Intravenous, Q10 min PRN, Donalee Citrin, MD, 5 mg at 03/26/17 2340 .  levETIRAcetam (KEPPRA) 500 mg in sodium chloride 0.9 % 100 mL IVPB, 500 mg, Intravenous, Q12H, Donalee Citrin, MD, Stopped at 03/27/17 0300 .  loratadine (CLARITIN) tablet 10 mg, 10 mg, Oral, Daily PRN, Donalee Citrin, MD .  MEDLINE mouth rinse, 15 mL, Mouth Rinse, BID, Donalee Citrin, MD .  nicardipine (CARDENE) 20mg  in 0.86% saline IV infusion (0.1 mg/ml), 3-15 mg/hr, Intravenous, Continuous, Arsenio Loader Ananias Pilgrim, MD, Stopped at 03/27/17 0845 .  ondansetron (ZOFRAN) tablet 4 mg, 4 mg, Oral, Q4H PRN **OR** ondansetron (ZOFRAN) injection 4 mg, 4 mg, Intravenous, Q4H PRN, Donalee Citrin, MD .  pantoprazole (PROTONIX) injection 40 mg, 40 mg, Intravenous, QHS, Donalee Citrin, MD .  paricalcitol (ZEMPLAR) capsule 1 mcg, 1 mcg, Oral, Daily, Donalee Citrin, MD .  promethazine (PHENERGAN) tablet 12.5-25 mg, 12.5-25 mg, Oral, Q4H PRN, Donalee Citrin, MD .  rosuvastatin (CRESTOR) tablet 10 mg, 10 mg, Oral, QHS, Donalee Citrin, MD   OBJECTIVE:   VITAL SIGNS: BP (!) 84/48 Comment: Hypotensive again upon standing. Patient sat back in bed.  Pulse 94   Temp 97.7 F (36.5 C) (Axillary)   Resp (!) 25   Ht 6\' 1"  (1.854 m)   Wt 80.8 kg (178 lb 2.1 oz)   SpO2 97%   BMI 23.50 kg/m   Vitals:   03/27/17 0910 03/27/17 0913 03/27/17 0915 03/27/17 0921  BP: (!) 63/53 117/74 (!) 84/48   Pulse: 90 73 94   Resp: (!) 24 16 (!) 25   Temp:      TempSrc:      SpO2: 100% 100% 96% 97%  Weight:      Height:        HEMODYNAMICS:    VENTILATOR SETTINGS:    INTAKE / OUTPUT: I/O last 3 completed shifts: In: 2589.5 [I.V.:2134.5; IV Piggyback:455] Out: 1090 [Urine:550; Drains:240; Blood:300]  PHYSICAL EXAMINATION: General:  Awake, alert. Noverbal. Does not follow commands. No acute distress. Normal  affect. Head: s/p craniotomy EYE: PERRLA, EOM intact, no scleral icterus, no pallor Nose: nares are patent. No exudate.  Throat/Oral Cavity: Normal dentition. No oral thrush. No exudate. Mucous membranes are moist. No tonsillar enlargement. Neck: supple, no thyromegaly, no JVD, no lymphadenopathy. Trachea midline. Chest/Lung: symmetric in development and expansion. Good air entry. No use of accessory muscles. No crackles. No wheezes. No dullness to percussion. Heart: Regular S1 and S2 without murmur, rub or gallop. Abdomen: soft, nontender, nondistended. Normoactive bowel sounds. n rebound. No guarding. No hepatosplenomegaly. Extremities: no pedal edema, no cyanosis, no clubbing. 2+ DP pulses Lymphatic: no cervical/axiallary/inguinal lymph nodes appreciated Musculoskeletal:  No deformities. Skin:  Warm. Dry. No rash or lesion. NEURO: facial symmetry. Cough and gag intact. R Babinski. Equivocal L plantar reflex. No sensory deficit. No motor deficit. DTR 2+ @ RUE, 2+ @ LUE 2+ @ RLL,  2+ @ LLL. Gait was not assessed.   LABS:  BMET Recent Labs  Lab 03/26/17 1701 03/26/17 1719 03/27/17 0029 03/27/17 0533  NA 136  139 138 138  K 4.0 4.2 3.8 4.1  CL 102 102 107 109  CO2 22  --  19* 20*  BUN 27* 30* 25* 24*  CREATININE 1.78* 1.60* 1.52* 1.43*  GLUCOSE 187* 193* 144* 176*    Electrolytes Recent Labs  Lab 03/26/17 1701 03/27/17 0029 03/27/17 0533  CALCIUM 9.3 8.0* 8.3*  MG  --   --  1.9  PHOS  --   --  3.2    CBC Recent Labs  Lab 03/26/17 1701 03/26/17 1719 03/27/17 0029 03/27/17 0533  WBC 6.5  --  6.4 7.7  HGB 13.7 15.3 10.8* 10.9*  HCT 41.5 45.0 32.6* 33.5*  PLT 194  --  153 154    Coag's Recent Labs  Lab 03/26/17 1701  APTT 36  INR 1.07    Sepsis Markers No results for input(s): LATICACIDVEN, PROCALCITON, O2SATVEN in the last 168 hours.  ABG No results for input(s): PHART, PCO2ART, PO2ART in the last 168 hours.  Liver Enzymes Recent Labs  Lab  03/26/17 1701  AST 18  ALT 10*  ALKPHOS 84  BILITOT 1.1  ALBUMIN 3.8    Cardiac Enzymes No results for input(s): TROPONINI, PROBNP in the last 168 hours.  Glucose Recent Labs  Lab 03/26/17 1657  GLUCAP 147*    Imaging Ct Head Wo Contrast  Result Date: 03/27/2017 CLINICAL DATA:  Follow-up subdural hematoma. EXAM: CT HEAD WITHOUT CONTRAST TECHNIQUE: Contiguous axial images were obtained from the base of the skull through the vertex without intravenous contrast. COMPARISON:  CT HEAD March 26, 2017 FINDINGS: BRAIN: Interval evacuation of LEFT subdural hematoma with extra-axial drain in place. 2 mm residual LEFT-to-RIGHT midline shift, decreased from 6 mm. Small volume LEFT extra-axial pneumocephalus and residual hematoma. 5 mm LEFT parafalcine dense subdural hematoma persists. New linear density LEFT cerebellum. Age indeterminate thalamus lacunar infarct, more conspicuous than prior CT though this may be related to re-expansion. No acute large vascular territory infarct. Basal cisterns are patent. Subdural density within included cervical spine. VASCULAR: Tortuous, possibly ectatic carotid siphons with mild calcific atherosclerosis. Tortuous vertebral arteries. SKULL: Interval LEFT frontoparietal craniotomy with subjacent Surgicel. LEFT scalp soft tissue swelling and skin staples. SINUSES/ORBITS: The mastoid air-cells and included paranasal sinuses are well-aerated.The included ocular globes and orbital contents are non-suspicious. OTHER: None. IMPRESSION: 1. Interval evacuation LEFT subdural hematoma with small residual component, extra-axial drain in place. 2 mm residual LEFT-to-RIGHT midline shift. 2. Stable 5 mm LEFT falcotentorial subdural hematoma. Linear LEFT cerebellar developmental venous anomaly versus hemorrhage. 3. Probable subdural hematoma extension to cervical spine. 4. Age indeterminate LEFT thalamus lacunar infarct. Electronically Signed   By: Awilda Metro M.D.   On:  03/27/2017 05:39   Ct Head Wo Contrast  Result Date: 03/26/2017 CLINICAL DATA:  81 y/o  M; altered level of consciousness. EXAM: CT HEAD WITHOUT CONTRAST TECHNIQUE: Contiguous axial images were obtained from the base of the skull through the vertex without intravenous contrast. COMPARISON:  None. FINDINGS: Brain: Acute subdural hematoma over the left cerebral convexity, left falx, and left tentorium cerebelli. Hematoma measures up to 14 mm in the lateral frontal region. Mass effect from the hematoma effaces sulci, partially effaces the left lateral ventricle, and results and 6 mm of left-to-right midline shift. No large acute stroke, brain parenchymal hemorrhage, or focal mass effect of brain parenchyma identified. No downward herniation or effacement of basilar cisterns. Vascular: Calcific atherosclerosis of carotid siphons. No hyperdense vessel. Skull: Normal. Negative for fracture or focal lesion. Sinuses/Orbits: Left  maxillary sinus mucous retention cyst and mild mucosal thickening of ethmoid air cells. Otherwise negative. Other: None. IMPRESSION: Acute subdural hematoma of left cerebral convexity, falx, and tentorium measuring up to 14 mm. Associated mass effect with 6 mm left-to-right midline shift. No herniation. Critical Value/emergent results were called by telephone at the time of interpretation on 03/26/2017 at 6:13 pm to Dr. Jacalyn Lefevre , who verbally acknowledged these results. Electronically Signed   By: Mitzi Hansen M.D.   On: 03/26/2017 18:15    CULTURES: Results for orders placed or performed during the hospital encounter of 03/26/17  MRSA PCR Screening     Status: None   Collection Time: 03/27/17  1:06 AM  Result Value Ref Range Status   MRSA by PCR NEGATIVE NEGATIVE Final    Comment:        The GeneXpert MRSA Assay (FDA approved for NASAL specimens only), is one component of a comprehensive MRSA colonization surveillance program. It is not intended to diagnose  MRSA infection nor to guide or monitor treatment for MRSA infections.     OTHER STUDIES:  -  ANTIBIOTICS: -  SIGNIFICANT EVENTS: 1/14 >> admitted with L SDH. Emergent craniotomy  LINES/TUBES: -  My assessment, plan of care, findings, medications, side effects, etc. were discussed with:  Nurse   Marcelle Smiling, MD Board Certified by the ABIM, Pulmonary Diseases & Critical Care Medicine  [facility] Pager: [phone]  03/27/2017, 10:57 AM  Cc: 45 min

## 2017-03-27 NOTE — Evaluation (Signed)
Speech Language Pathology Evaluation Patient Details Name: Juan Juan Goodwin Juan Goodwin MRN: [id number] DOB: Jul 23, 1935 Today's Date: 03/27/2017 Time: [id number] SLP Time Calculation (min) (ACUTE ONLY): 16 min  Problem List:  Patient Active Problem List   Diagnosis Date Noted  . Orthostatic hypotension 03/27/2017  . Global aphasia 03/27/2017  . Subdural hematoma (HCC) 03/26/2017  . Acute pyloric channel ulcer 07/13/2014  . Hypokalemia 07/13/2014  . Acute upper GI bleed 07/13/2014  . ARF (acute renal failure) (HCC) 07/13/2014  . Hypernatremia 07/13/2014  . Hypotension 07/13/2014  . Thrombocytopenia (HCC) 07/13/2014  . Helicobacter pylori gastritis 07/13/2014  . Gastrointestinal hemorrhage with melena   . Melena 07/11/2014  . GI bleed 07/11/2014  . Acute blood loss anemia 07/10/2014   Past Medical History:  Past Medical History:  Diagnosis Date  . Hypertension   . Renal insufficiency    Past Surgical History:  Past Surgical History:  Procedure Laterality Date  . ESOPHAGOGASTRODUODENOSCOPY N/A 07/11/2014   Procedure: ESOPHAGOGASTRODUODENOSCOPY (EGD);  Surgeon: Charlott RakesVincent Schooler, MD;  [facility];  Service: Endoscopy;  Laterality: N/A;   HPI:  82 y.o. male admitted with confusion, changes in speech; CCT showed large left SDH.  Underwent left pterional craniotomy/evacuation 1/14.    Assessment / Plan / Recommendation Clinical Impression  Pt presents with a nonfluent expressive>receptive aphasia with output limited to occasional one-word utterances.  Demonstrates decreased initiation, difficulty with automatic sequences and single word repetition.  Pt responding "yes/no" with 80% accuracy for biographical questions; imitated occasional single words and read "applesauce" aloud with paraphasic output.  Follows simple commands when they are given in a predictable context (e.g, ADLs) - but without context, accuracy deteriorates. Recommend SLP tx to assist with communication of basic  needs/ideas.  Given current cognitive-communicative status, pt will need 24 hour supervision, at least initially.     SLP Assessment  SLP Recommendation/Assessment: Patient needs continued Speech Lanaguage Pathology Services SLP Visit Diagnosis: Aphasia (R47.01)    Follow Up Recommendations  (tba)    Frequency and Duration min 2x/week  2 weeks      SLP Evaluation Cognition  Overall Cognitive Status: Impaired/Different from baseline Orientation Level: (P) Other (comment);Oriented to person(uta, aphasia) Attention: Sustained Sustained Attention: Impaired Sustained Attention Impairment: Verbal basic       Comprehension  Auditory Comprehension Overall Auditory Comprehension: Impaired Yes/No Questions: Impaired Basic Biographical Questions: 76-100% accurate Commands: Impaired One Step Basic Commands: 75-100% accurate Conversation: Simple Interfering Components: Attention EffectiveTechniques: Extra processing time Visual Recognition/Discrimination Discrimination: Exceptions to [facility] Common Objects: Able in field of 2(75% accuracy) Reading Comprehension Reading Status: Not tested    Expression Expression Primary Mode of Expression: Verbal Verbal Expression Overall Verbal Expression: Impaired Initiation: Impaired Automatic Speech: (unable despite verbal/visual cues) Level of Generative/Spontaneous Verbalization: Word Repetition: Impaired Level of Impairment: Word level Naming: Impairment Confrontation: Impaired Common Objects: Able in field of 2(75% accuracy) Verbal Errors: Phonemic paraphasias Written Expression Written Expression: Not tested   Oral / Motor  Oral Motor/Sensory Function Overall Oral Motor/Sensory Function: Mild impairment Facial ROM: Reduced right Facial Symmetry: Abnormal symmetry right Facial Strength: Reduced right Facial Sensation: Reduced right Lingual Symmetry: Within Functional Limits Motor Speech Overall Motor Speech: Impaired   GO                     Blenda MountsCouture, Levia Waltermire Laurice 03/27/2017, 12:10 PM  Orissa Arreaga Goodwin. Samson Fredericouture, [place] CCC/SLP Pager [phone]

## 2017-03-27 NOTE — Progress Notes (Signed)
Paged neurosurgery to confirm goal blood pressure. [facility], goal blood pressure less than 170 systolic. CCM PA on site stated to keep systolic less than 140. Neurosurgery states blood pressure goal should be less than 170. Titrating cardene gtt at this time. Will continue to monitor patient.

## 2017-03-28 ENCOUNTER — Inpatient Hospital Stay (HOSPITAL_COMMUNITY): Payer: Medicare HMO

## 2017-03-28 NOTE — Progress Notes (Signed)
[facility] Pulmonary Diseases & Critical Care Medicine Progress Note  Patient Name: Juan MantleJimmy L Goodwin MRN: [id number] DOB: Apr 10, 1935    ADMISSION DATE:  03/26/2017 CONSULTATION DATE:  03/26/2017  REFERRING MD:  Dr. Donalee CitrinGary Cram  REASON FOR CONSULTATION:  Subdural hematoma, postoperative care   ASSESSMENT/PLAN:  ASSESSMENT (included in the Hospital Problem List)  Principal Problem:   Subdural hematoma (HCC) Active Problems:   Global aphasia   Orthostatic hypotension    PLAN/RECOMMENDATIONS   HOB 30+ degrees at all times  IV fluids: Decrease NS + 20 mEq KCl to 40 mL/hr  Continue amlodipine, irbesartan/HCTZ  Continue rosuvastatin   NUTRITION: NPO for now   DVT PROPHYLAXIS: SCDs  GI PROPHYLAXIS: Protonix  DISPOSITION: OK to transfer to the floor from pulmonary/critical care standpoint    SUBJECTIVE:   -Interim events: dramatic improvement this am. Out of bed to chair. Follows commands. Conversant. Oriented to time, person and place. Nurse reports that this improvement has rapidly progressed over the past hour. Cr down to 1.43.   HISTORY OF PRESENT ILLNESS 82 year old male with past medical history significant for H. pylori, GI hemorrhage secondary to an acute pyloric ulcer, presented on 03/26/2017 with primary complaints of aphasia and confusion.  CT head identified large acute left subdural hematoma with midline shift.  Patient was taken for emergent craniotomy by neurosurgery. PCCM consulted for postop management.  REVIEW OF SYSTEMS: unobtainable   PAST MEDICAL/SURGICAL/SOCIAL/FAMILY HISTORY  Past Medical History:  Diagnosis Date  . Hypertension   . Renal insufficiency     Past Surgical History:  Procedure Laterality Date  . CRANIOTOMY Left 03/26/2017   Procedure: CRANIOTOMY HEMATOMA EVACUATION SUBDURAL;  Surgeon: Donalee Citrinram, Gary, MD;  [facility];  Service: Neurosurgery;  Laterality: Left;  . ESOPHAGOGASTRODUODENOSCOPY N/A 07/11/2014   Procedure:  ESOPHAGOGASTRODUODENOSCOPY (EGD);  Surgeon: Charlott RakesVincent Schooler, MD;  [facility];  Service: Endoscopy;  Laterality: N/A;    Social History   Tobacco Use  . Smoking status: Never Smoker  . Smokeless tobacco: Never Used  Substance Use Topics  . Alcohol use: No    History reviewed. No pertinent family history.  Prior to Admission medications   Medication Sig Start Date End Date Taking? Authorizing Provider  amLODipine (NORVASC) 10 MG tablet Take 10 mg by mouth at bedtime. 01/03/17  Yes [provider]  loratadine (CLARITIN) 10 MG tablet Take 10 mg by mouth daily as needed for allergies.    Yes [provider]  olmesartan-hydrochlorothiazide (BENICAR HCT) 40-12.5 MG tablet Take 1 tablet by mouth daily. 03/05/17  Yes [provider]  paricalcitol (ZEMPLAR) 1 MCG capsule Take 1 mcg by mouth daily. 05/26/14  Yes [provider]  rosuvastatin (CRESTOR) 10 MG tablet Take 10 mg by mouth daily. 12/19/16  Yes [provider]  pantoprazole (PROTONIX) 40 MG tablet Take 1 tablet (40 mg total) by mouth 2 (two) times daily. TAKE 1 TABLET 2 TIMES DAILY X 3 MONTHS THEN 1 TABLET DAILY AFTER. Patient not taking: Reported on 03/26/2017 07/27/14   Rodolph Bonghompson, Daniel V, MD    No Known Allergies   Current Facility-Administered Medications:  .  0.9 %  sodium chloride infusion, , Intravenous, Continuous, Jairo BenJackson, Carswell, MD .  0.9 % NaCl with KCl 20 mEq/ L  infusion, , Intravenous, Continuous, Desai, Rahul P, PA-C, Last Rate: 50 mL/hr at 03/28/17 0900 .  amLODipine (NORVASC) tablet 10 mg, 10 mg, Oral, QHS, Donalee Citrinram, Gary, MD, 10 mg at 03/27/17 2300 .  [COMPLETED] dexamethasone (DECADRON) injection 6  mg, 6 mg, Intravenous, Q6H, 6 mg at 03/27/17 1846 **FOLLOWED BY** dexamethasone (DECADRON) injection 4 mg, 4 mg, Intravenous, Q6H, 4 mg at 03/28/17 0555 **FOLLOWED BY** [START ON 03/29/2017] dexamethasone (DECADRON) injection 4 mg, 4 mg, Intravenous, Q8H, Donalee Citrin,  MD .  docusate sodium (COLACE) capsule 100 mg, 100 mg, Oral, BID, Donalee Citrin, MD, 100 mg at 03/28/17 0918 .  irbesartan (AVAPRO) tablet 300 mg, 300 mg, Oral, Daily, 300 mg at 03/28/17 0917 **AND** hydrochlorothiazide (MICROZIDE) capsule 12.5 mg, 12.5 mg, Oral, Daily, Donalee Citrin, MD, 12.5 mg at 03/28/17 0918 .  HYDROmorphone (DILAUDID) injection 0.5 mg, 0.5 mg, Intravenous, Q2H PRN, Donalee Citrin, MD .  labetalol (NORMODYNE,TRANDATE) injection 10-40 mg, 10-40 mg, Intravenous, Q10 min PRN, Donalee Citrin, MD, 5 mg at 03/26/17 2340 .  levETIRAcetam (KEPPRA) 500 mg in sodium chloride 0.9 % 100 mL IVPB, 500 mg, Intravenous, Q12H, Donalee Citrin, MD, Stopped at 03/28/17 0315 .  loratadine (CLARITIN) tablet 10 mg, 10 mg, Oral, Daily PRN, Donalee Citrin, MD .  MEDLINE mouth rinse, 15 mL, Mouth Rinse, BID, Donalee Citrin, MD, 15 mL at 03/28/17 0918 .  nicardipine (CARDENE) 20mg  in 0.86% saline IV infusion (0.1 mg/ml), 3-15 mg/hr, Intravenous, Continuous, Arsenio Loader Ananias Pilgrim, MD, Stopped at 03/27/17 0845 .  ondansetron (ZOFRAN) tablet 4 mg, 4 mg, Oral, Q4H PRN **OR** ondansetron (ZOFRAN) injection 4 mg, 4 mg, Intravenous, Q4H PRN, Donalee Citrin, MD .  pantoprazole (PROTONIX) injection 40 mg, 40 mg, Intravenous, QHS, Donalee Citrin, MD, 40 mg at 03/27/17 2300 .  paricalcitol (ZEMPLAR) capsule 1 mcg, 1 mcg, Oral, Daily, Donalee Citrin, MD, 1 mcg at 03/28/17 [phone]  promethazine (PHENERGAN) tablet 12.5-25 mg, 12.5-25 mg, Oral, Q4H PRN, Donalee Citrin, MD .  rosuvastatin (CRESTOR) tablet 10 mg, 10 mg, Oral, QHS, Donalee Citrin, MD, 10 mg at 03/27/17 2300   OBJECTIVE:   VITAL SIGNS: BP 116/78   Pulse 81   Temp 98 F (36.7 C) (Oral)   Resp (!) 21   Ht 6\' 1"  (1.854 m)   Wt 80.8 kg (178 lb 2.1 oz)   SpO2 94%   BMI 23.50 kg/m  Vitals:   03/28/17 0600 03/28/17 0700 03/28/17 0800 03/28/17 0900  BP: (!) 151/124 126/84 (!) 141/91 116/78  Pulse: 74 70 70 81  Resp: 20 12 (!) 25 (!) 21  Temp:   98 F (36.7 C)   TempSrc:   Oral   SpO2: 100%  100% 99% 94%  Weight:      Height:        HEMODYNAMICS:    VENTILATOR SETTINGS:    INTAKE / OUTPUT: I/O last 3 completed shifts: In: 4319.9 [P.O.:260; I.V.:3394.9; IV Piggyback:665] Out: 2595 [Urine:1815; Drains:480; Blood:300]  PHYSICAL EXAMINATION: General:  Awake, alert. Oriented to time, person, place. No acute distress. Normal affect. Pleasant demeanor. Head: s/p craniotomy EYE: PERRLA, EOM intact, no scleral icterus, no pallor Nose: nares are patent. No exudate.  Throat/Oral Cavity: Normal dentition. No oral thrush. No exudate. Mucous membranes are moist. No tonsillar enlargement. Neck: supple, no thyromegaly, no JVD, no lymphadenopathy. Trachea midline. Chest/Lung: symmetric in development and expansion. Good air entry. No use of accessory muscles. No crackles. No wheezes. No dullness to percussion. Heart: Regular S1 and S2 without murmur, rub or gallop. Abdomen: soft, nontender, nondistended. Normoactive bowel sounds. n rebound. No guarding. No hepatosplenomegaly. Extremities: no pedal edema, no cyanosis, no clubbing. 2+ DP pulses Lymphatic: no cervical/axiallary/inguinal lymph nodes appreciated Musculoskeletal:  No deformities. Skin:  Warm. Dry. No  rash or lesion. NEURO: facial symmetry. Cough and gag intact. R Babinski. Equivocal L plantar reflex. No sensory deficit. No motor deficit. DTR 2+ @ RUE, 2+ @ LUE 2+ @ RLL,  2+ @ LLL. Gait was not assessed.   LABS:  BMET Recent Labs  Lab 03/26/17 1701 03/26/17 1719 03/27/17 0029 03/27/17 0533  NA 136 139 138 138  K 4.0 4.2 3.8 4.1  CL 102 102 107 109  CO2 22  --  19* 20*  BUN 27* 30* 25* 24*  CREATININE 1.78* 1.60* 1.52* 1.43*  GLUCOSE 187* 193* 144* 176*    Electrolytes Recent Labs  Lab 03/26/17 1701 03/27/17 0029 03/27/17 0533  CALCIUM 9.3 8.0* 8.3*  MG  --   --  1.9  PHOS  --   --  3.2    CBC Recent Labs  Lab 03/26/17 1701 03/26/17 1719 03/27/17 0029 03/27/17 0533  WBC 6.5  --  6.4 7.7  HGB  13.7 15.3 10.8* 10.9*  HCT 41.5 45.0 32.6* 33.5*  PLT 194  --  153 154    Coag's Recent Labs  Lab 03/26/17 1701  APTT 36  INR 1.07    Sepsis Markers No results for input(s): LATICACIDVEN, PROCALCITON, O2SATVEN in the last 168 hours.  ABG No results for input(s): PHART, PCO2ART, PO2ART in the last 168 hours.  Liver Enzymes Recent Labs  Lab 03/26/17 1701  AST 18  ALT 10*  ALKPHOS 84  BILITOT 1.1  ALBUMIN 3.8    Cardiac Enzymes No results for input(s): TROPONINI, PROBNP in the last 168 hours.  Glucose Recent Labs  Lab 03/26/17 1657  GLUCAP 147*    Imaging No results found.  CULTURES: Results for orders placed or performed during the hospital encounter of 03/26/17  MRSA PCR Screening     Status: None   Collection Time: 03/27/17  1:06 AM  Result Value Ref Range Status   MRSA by PCR NEGATIVE NEGATIVE Final    Comment:        The GeneXpert MRSA Assay (FDA approved for NASAL specimens only), is one component of a comprehensive MRSA colonization surveillance program. It is not intended to diagnose MRSA infection nor to guide or monitor treatment for MRSA infections.     OTHER STUDIES:  -  ANTIBIOTICS: -  SIGNIFICANT EVENTS: 1/14 >> admitted with L SDH. Emergent craniotomy 1/15 >> dramatic neurologic improvement  LINES/TUBES: -  My assessment, plan of care, findings, medications, side effects, etc. were discussed with:  Nurse   Marcelle Smiling, MD Board Certified by the ABIM, Pulmonary Diseases & Critical Care Medicine  [facility] Pager: [phone]  03/28/2017, 9:48 AM

## 2017-03-28 NOTE — Anesthesia Postprocedure Evaluation (Signed)
Anesthesia Post Note  Patient: Juan Goodwin  Procedure(s) Performed: CRANIOTOMY HEMATOMA EVACUATION SUBDURAL (Left Head)     Patient location during evaluation: PACU Anesthesia Type: General Level of consciousness: sedated and patient cooperative Pain management: pain level controlled Vital Signs Assessment: post-procedure vital signs reviewed and stable Respiratory status: spontaneous breathing, nonlabored ventilation, respiratory function stable and patient connected to nasal cannula oxygen Cardiovascular status: blood pressure returned to baseline and stable Postop Assessment: no apparent nausea or vomiting Anesthetic complications: no    Last Vitals:  Vitals:   03/28/17 1400 03/28/17 1500  BP: (!) 143/84 140/90  Pulse: 80 84  Resp: (!) 25 16  Temp:    SpO2: 100% 100%    Last Pain:  Vitals:   03/28/17 1200  TempSrc: Oral  PainSc:                  Hargun Spurling,E. Shameika Speelman

## 2017-03-28 NOTE — Progress Notes (Signed)
Subjective: Patient reports Patient doing very well awake alert oriented still little bit of expressive dysphasia  Objective: Vital signs in last 24 hours: Temp:  [97.5 F (36.4 C)-98.2 F (36.8 C)] 97.9 F (36.6 C) (01/16 1200) Pulse Rate:  [67-94] 84 (01/16 1500) Resp:  [7-25] 16 (01/16 1500) BP: (116-156)/(70-124) 140/90 (01/16 1500) SpO2:  [94 %-100 %] 100 % (01/16 1500)  Intake/Output from previous day: 01/15 0701 - 01/16 0700 In: 1730.4 [P.O.:260; I.V.:1260.4; IV Piggyback:210] Out: 1505 [Urine:1265; Drains:240] Intake/Output this shift: Total I/O In: 371.8 [I.V.:371.8] Out: 250 [Urine:250]  Moves all extremities well incision clean dry and intact  Lab Results: Recent Labs    03/27/17 0029 03/27/17 0533  WBC 6.4 7.7  HGB 10.8* 10.9*  HCT 32.6* 33.5*  PLT 153 154   BMET Recent Labs    03/27/17 0029 03/27/17 0533  NA 138 138  K 3.8 4.1  CL 107 109  CO2 19* 20*  GLUCOSE 144* 176*  BUN 25* 24*  CREATININE 1.52* 1.43*  CALCIUM 8.0* 8.3*    Studies/Results: Ct Head Wo Contrast  Result Date: 03/27/2017 CLINICAL DATA:  Follow-up subdural hematoma. EXAM: CT HEAD WITHOUT CONTRAST TECHNIQUE: Contiguous axial images were obtained from the base of the skull through the vertex without intravenous contrast. COMPARISON:  CT HEAD March 26, 2017 FINDINGS: BRAIN: Interval evacuation of LEFT subdural hematoma with extra-axial drain in place. 2 mm residual LEFT-to-RIGHT midline shift, decreased from 6 mm. Small volume LEFT extra-axial pneumocephalus and residual hematoma. 5 mm LEFT parafalcine dense subdural hematoma persists. New linear density LEFT cerebellum. Age indeterminate thalamus lacunar infarct, more conspicuous than prior CT though this may be related to re-expansion. No acute large vascular territory infarct. Basal cisterns are patent. Subdural density within included cervical spine. VASCULAR: Tortuous, possibly ectatic carotid siphons with mild calcific  atherosclerosis. Tortuous vertebral arteries. SKULL: Interval LEFT frontoparietal craniotomy with subjacent Surgicel. LEFT scalp soft tissue swelling and skin staples. SINUSES/ORBITS: The mastoid air-cells and included paranasal sinuses are well-aerated.The included ocular globes and orbital contents are non-suspicious. OTHER: None. IMPRESSION: 1. Interval evacuation LEFT subdural hematoma with small residual component, extra-axial drain in place. 2 mm residual LEFT-to-RIGHT midline shift. 2. Stable 5 mm LEFT falcotentorial subdural hematoma. Linear LEFT cerebellar developmental venous anomaly versus hemorrhage. 3. Probable subdural hematoma extension to cervical spine. 4. Age indeterminate LEFT thalamus lacunar infarct. Electronically Signed   By: Awilda Metroourtnay  Bloomer M.D.   On: 03/27/2017 05:39   Ct Head Wo Contrast  Result Date: 03/26/2017 CLINICAL DATA:  82 y/o  M; altered level of consciousness. EXAM: CT HEAD WITHOUT CONTRAST TECHNIQUE: Contiguous axial images were obtained from the base of the skull through the vertex without intravenous contrast. COMPARISON:  None. FINDINGS: Brain: Acute subdural hematoma over the left cerebral convexity, left falx, and left tentorium cerebelli. Hematoma measures up to 14 mm in the lateral frontal region. Mass effect from the hematoma effaces sulci, partially effaces the left lateral ventricle, and results and 6 mm of left-to-right midline shift. No large acute stroke, brain parenchymal hemorrhage, or focal mass effect of brain parenchyma identified. No downward herniation or effacement of basilar cisterns. Vascular: Calcific atherosclerosis of carotid siphons. No hyperdense vessel. Skull: Normal. Negative for fracture or focal lesion. Sinuses/Orbits: Left maxillary sinus mucous retention cyst and mild mucosal thickening of ethmoid air cells. Otherwise negative. Other: None. IMPRESSION: Acute subdural hematoma of left cerebral convexity, falx, and tentorium measuring up to  14 mm. Associated mass effect with 6 mm left-to-right midline  shift. No herniation. Critical Value/emergent results were called by telephone at the time of interpretation on 03/26/2017 at 6:13 pm to Dr. Jacalyn Lefevre , who verbally acknowledged these results. Electronically Signed   By: Mitzi Hansen M.D.   On: 03/26/2017 18:15   Dg Chest Port 1 View  Result Date: 03/28/2017 CLINICAL DATA:  Respiratory failure.  Postoperative status. EXAM: PORTABLE CHEST 1 VIEW COMPARISON:  None. FINDINGS: The heart size and mediastinal contours are within normal limits. Both lungs are clear. No pneumothorax or pleural effusion is noted. The visualized skeletal structures are unremarkable. IMPRESSION: No acute cardiopulmonary abnormality seen. Electronically Signed   By: Lupita Raider, M.D.   On: 03/28/2017 14:17    Assessment/Plan: Postoperative day 2 craniotomy for subdural good progress we will DC his JP drain observe in the ICU 1 more night transfer the floor tomorrow.  LOS: 2 days     Juan Goodwin P 03/28/2017, 4:26 PM

## 2017-03-28 NOTE — Procedures (Deleted)
Bronchoscopy  for Percutaneous  Tracheostomy  Name: Juan Goodwin MRN: [id number] DOB: 05/17/1935 Procedure: Bronchoscopy for Percutaneous Tracheostomy Indications: Diagnostic evaluation of the airways In conjunction with: Dr. Tyson AliasFeinstein   Procedure Details Consent: Risks of procedure as well as the alternatives and risks of each were explained to the (patient/caregiver).  Consent for procedure obtained. Time Out: Verified patient identification, verified procedure, site/side was marked, verified correct patient position, special equipment/implants available, medications/allergies/relevent history reviewed, required imaging and test results available.  Performed  In preparation for procedure, patient was given 100% FiO2 and bronchoscope lubricated. Sedation: Benzodiazepines, Muscle relaxants and Etomidate  Airway entered and the following bronchi were examined: trachea   Procedures performed: Endotracheal Tube retracted in 2 cm increments. Cannulation of airway observed. Dilation observed. Placement of trachel tube  observed . No overt complications. Bronchoscope removed.    Evaluation Hemodynamic Status: Transient hypertension requiring treatment; O2 sats: currently acceptable Patient's Current Condition: stable Specimens:  None Complications: No apparent complications Patient did tolerate procedure well.   Brett CanalesSteve Minor ACNP [facility] Pager [phone] till 3 pm If no answer page [phone] 03/28/2017, 11:15 AM

## 2017-03-28 NOTE — Plan of Care (Signed)
Care plan updated. Jazlin Tapscott RN BSN 

## 2017-03-28 NOTE — Evaluation (Signed)
Occupational Therapy Evaluation Patient Details Name: Juan MantleJimmy L Oriordan MRN: [id number] DOB: 07-31-1935 Today's Date: 03/28/2017    History of Present Illness 82 yo admitted with altered speech with left SDH s/p crani 1/14 with aphasia. PMHx: HTN, renal insufficiency   Clinical Impression   PTA, pt was living alone and was independent. Pt currently performing ADLs and functional mobility [facility]. Pt presenting with decreased standing balance and cognition as seen in problem solving and ST memory. Pt very agreeable and motivated to participate in therapy. Pt would benefit acute OT to facilitate safe dc and address cognition. Recommend dc home with 24 hour supervision and follow up with neuro outpatient OT to optimize safety and return to PLOF.     Follow Up Recommendations  Outpatient OT;Supervision/Assistance - 24 hour(Neuro OP OT)    Equipment Recommendations  3 in 1 bedside commode    Recommendations for Other Services       Precautions / Restrictions Precautions Precautions: Fall Precaution Comments: watch BP Restrictions Weight Bearing Restrictions: No      Mobility Bed Mobility Overal bed mobility: Needs Assistance Bed Mobility: Supine to Sit;Sit to Supine     Supine to sit: Supervision Sit to supine: Supervision   General bed mobility comments: supervision for lines and cues for sequence  Transfers Overall transfer level: Needs assistance Equipment used: None Transfers: Sit to/from Stand Sit to Stand: Min guard         General transfer comment: Min guard for safety    Balance Overall balance assessment: No apparent balance deficits (not formally assessed)                                         ADL either performed or assessed with clinical judgement   ADL Overall ADL's : Needs assistance/impaired Eating/Feeding: Set up;Supervision/ safety;Sitting   Grooming: Min guard;Standing;Oral care   Upper Body Bathing: Set  up;Supervision/ safety;Sitting   Lower Body Bathing: Min guard;Sit to/from stand   Upper Body Dressing : Set up;Supervision/safety;Sitting   Lower Body Dressing: Min guard;Sit to/from stand Lower Body Dressing Details (indicate cue type and reason): Pt able to reach forward and adjust socks without difficulty Toilet Transfer: Min guard;Ambulation(Simulated in room)           Functional mobility during ADLs: Min guard General ADL Comments: Pt demonstrating decreased balance and cognition. Very agreeable     Vision Baseline Vision/History: Wears glasses Wears Glasses: Reading only Patient Visual Report: No change from baseline Vision Assessment?: Yes Eye Alignment: Within Functional Limits Ocular Range of Motion: Within Functional Limits Tracking/Visual Pursuits: Able to track stimulus in all quads without difficulty;Other (comment)(Noted fatigue) Additional Comments: Some noted visual fatigue during tracking task and required cues to continue     Perception     Praxis      Pertinent Vitals/Pain Pain Assessment: No/denies pain     Hand Dominance Right   Extremity/Trunk Assessment Upper Extremity Assessment Upper Extremity Assessment: Overall WFL for tasks assessed   Lower Extremity Assessment Lower Extremity Assessment: Overall WFL for tasks assessed   Cervical / Trunk Assessment Cervical / Trunk Assessment: Normal   Communication Communication Communication: HOH   Cognition Arousal/Alertness: Awake/alert Behavior During Therapy: WFL for tasks assessed/performed Overall Cognitive Status: Impaired/Different from baseline Area of Impairment: Following commands;Problem solving;Memory  Memory: Decreased short-term memory Following Commands: Follows one step commands with increased time     Problem Solving: Slow processing;Requires verbal cues General Comments: Pt able to follow commands with increased time. Pt requiring increased time  to complete money management task, which is different from baseline according to pt and friend. Pt demonstrating decreased ST memory and was unable to recall 3 memory words right after being told them. Pt able to name 4 animals that start with a C. Pt and friend confirm that pt's cogition is not at baseline   General Comments  Pt close friends present at begining and end of session    Exercises     Shoulder Instructions      Home Living Family/patient expects to be discharged to:: Private residence Living Arrangements: Alone Available Help at Discharge: Family;Available 24 hours/day;Friend(s) Type of Home: House Home Access: Level entry     Home Layout: One level     Bathroom Shower/Tub: Tub/shower unit;Walk-in shower   Bathroom Toilet: Handicapped height     Home Equipment: Environmental consultant - 2 wheels   Additional Comments: Pt providing home set up and close friend confirming all information      Prior Functioning/Environment Level of Independence: Independent        Comments: ADLs and IADLs        OT Problem List: Decreased activity tolerance;Impaired balance (sitting and/or standing);Decreased cognition;Decreased safety awareness;Decreased knowledge of use of DME or AE      OT Treatment/Interventions: Self-care/ADL training;Therapeutic exercise;Energy conservation;DME and/or AE instruction;Therapeutic activities;Patient/family education;Visual/perceptual remediation/compensation    OT Goals(Current goals can be found in the care plan section) Acute Rehab OT Goals Patient Stated Goal: return home OT Goal Formulation: With patient Time For Goal Achievement: 04/11/17 Potential to Achieve Goals: Good ADL Goals Additional ADL Goal #1: Pt will perform four grooming tasks with 1-2 cues for recall Additional ADL Goal #2: Pt will perform ADLs at independent level Additional ADL Goal #3: Pt will perform four step trail making task with 2-3 cues  OT Frequency: Min 2X/week    Barriers to D/C:            Co-evaluation              AM-PAC PT "6 Clicks" Daily Activity     Outcome Measure Help from another person eating meals?: None Help from another person taking care of personal grooming?: A Little Help from another person toileting, which includes using toliet, bedpan, or urinal?: A Little Help from another person bathing (including washing, rinsing, drying)?: A Little Help from another person to put on and taking off regular upper body clothing?: A Little Help from another person to put on and taking off regular lower body clothing?: A Little 6 Click Score: 19   End of Session Equipment Utilized During Treatment: Gait belt Nurse Communication: Mobility status;Other (comment)(IV bleeding)  Activity Tolerance: Patient tolerated treatment well Patient left: in bed;with call bell/phone within reach;with nursing/sitter in room;with family/visitor present  OT Visit Diagnosis: Unsteadiness on feet (R26.81);Other abnormalities of gait and mobility (R26.89);Other symptoms and signs involving cognitive function                Time: 4098-1191 OT Time Calculation (min): 30 min Charges:  OT General Charges $OT Visit: 1 Visit OT Evaluation $OT Eval Moderate Complexity: 1 Mod OT Treatments $Self Care/Home Management : 8-22 mins G-Codes:     Zeinab Rodwell MSOT, OTR/L Acute Rehab Pager: [phone] Office: [phone]  Theodoro Grist Anniemae Haberkorn 03/28/2017, 5:32 PM

## 2017-03-28 NOTE — Progress Notes (Signed)
  Speech Language Pathology Treatment: Cognitive-Linquistic  Patient Details Name: Juan MantleJimmy L Goodwin MRN: [id number] DOB: 1935-03-18 Today's Date: 03/28/2017 Time: [id number] SLP Time Calculation (min) (ACUTE ONLY): 11 min  Assessment / Plan / Recommendation Clinical Impression  Significant improvements in communication today.  Pt approaching baseline (he describes himself as 80% of normal) - output is fluent; pt is speaking in full grammatic sentences.  There is mild dysnomia at conversational levels, but confrontation/responsive naming is WNL.  Pt follows commands; yes/no is reliable.  Dysphagia is resolved - tolerating regular diet, thin liquids without deficit.  Daughter present and extremely pleased with pt's progress.  SLP will follow to determine D/C needs.    HPI HPI: 82 y.o. male admitted with confusion, changes in speech; CCT showed large left SDH.  Underwent left pterional craniotomy/evacuation 1/14.       SLP Plan  Continue with current plan of care       Recommendations  Diet recommendations: Regular;Thin liquid Liquids provided via: Cup;Straw Medication Administration: Whole meds with liquid                Follow up Recommendations: Other (comment)(tba) SLP Visit Diagnosis: Aphasia (R47.01) Plan: Continue with current plan of care       GO                Carolan ShiverCouture, Preslynn Bier Laurice 03/28/2017, 11:36 AM

## 2017-03-29 DIAGNOSIS — I10 Essential (primary) hypertension: Secondary | ICD-10-CM | POA: Diagnosis present
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
## 2017-03-29 NOTE — Progress Notes (Signed)
Subjective: Patient reports Patient doing well minimal headache  Objective: Vital signs in last 24 hours: Temp:  [97.2 F (36.2 C)-98 F (36.7 C)] 97.2 F (36.2 C) (01/17 0400) Pulse Rate:  [60-100] 65 (01/17 0700) Resp:  [10-25] 13 (01/17 0700) BP: (116-168)/(72-108) 138/84 (01/17 0700) SpO2:  [94 %-100 %] 100 % (01/17 0700)  Intake/Output from previous day: 01/16 0701 - 01/17 0700 In: 1281.8 [P.O.:60; I.V.:1011.8; IV Piggyback:210] Out: 550 [Urine:550] Intake/Output this shift: No intake/output data recorded.  Awake alert still expressive dysphasia but moves all extremities well  Lab Results: Recent Labs    03/27/17 0029 03/27/17 0533  WBC 6.4 7.7  HGB 10.8* 10.9*  HCT 32.6* 33.5*  PLT 153 154   BMET Recent Labs    03/27/17 0029 03/27/17 0533  NA 138 138  K 3.8 4.1  CL 107 109  CO2 19* 20*  GLUCOSE 144* 176*  BUN 25* 24*  CREATININE 1.52* 1.43*  CALCIUM 8.0* 8.3*    Studies/Results: Dg Chest Port 1 View  Result Date: 03/28/2017 CLINICAL DATA:  Respiratory failure.  Postoperative status. EXAM: PORTABLE CHEST 1 VIEW COMPARISON:  None. FINDINGS: The heart size and mediastinal contours are within normal limits. Both lungs are clear. No pneumothorax or pleural effusion is noted. The visualized skeletal structures are unremarkable. IMPRESSION: No acute cardiopulmonary abnormality seen. Electronically Signed   By: Lupita RaiderJames  Green Jr, M.D.   On: 03/28/2017 14:17    Assessment/Plan: Postop day 3 craniotomy for subdural making good progress will transfer to the stepdown continue blood pressure management per CCM medicine.  LOS: 3 days     Laurence Crofford P 03/29/2017, 7:47 AM

## 2017-03-29 NOTE — Progress Notes (Signed)
  Speech Language Pathology Treatment: Cognitive-Linquistic  Patient Details Name: Juan Juan Goodwin Juan Goodwin MRN: [id number] DOB: [birth year] Today's Date: 03/29/2017 Time: [id number] SLP Time Calculation (min) (ACUTE ONLY): 23 min  Assessment / Plan / Recommendation Clinical Impression  Patient seen for follow-up for diagnostic treatment of aphasia and cognitive communication impairments. Pt pleasant, engages in simple conversation with SLP. Speech is fluent with intact syntax, however hesitations and circumlocution noted with extended conversation. Spelling errors apparent in functional sentence-level writing. In clock drawing task, pt restarted task after recognizing errors, however continued with disorganization in spacing, perseverated the number 6 twice and 9 once. SLP facilitated simple money problem solving task; pt required extended time and mod cues for accurate addition. Per his report, he was managing his own finances prior to admission. Education provided to pt and girlfriend re: recommendations for outpatient SLP f/u for cognitive-linguistic deficits, with supervision/assistance at home for safety with medications, finances. Educated re: cognitive activities for home. Will continue to follow during acute stay for education and interventions to improve cognition and communication.     HPI HPI: 82 y.o. male admitted with confusion, changes in speech; CCT showed large left SDH.  Underwent left pterional craniotomy/evacuation 1/14.       SLP Plan  Continue with current plan of care       Recommendations                   Oral Care Recommendations: Oral care BID Follow up Recommendations: Outpatient SLP SLP Visit Diagnosis: Aphasia (R47.01);Cognitive communication deficit (H08.657(R41.841) Plan: Continue with current plan of care       GO              Juan BatonMary Beth Shirah Roseman, MS, CCC-SLP Speech-Language Pathologist [phone]  Juan Juan Goodwin 03/29/2017, 4:26 PM

## 2017-03-29 NOTE — Progress Notes (Addendum)
[facility] Pulmonary Diseases & Critical Care Medicine Progress Note  Patient Name: PHU RECORD MRN: [id number] DOB: 1935-03-19    ADMISSION DATE:  03/26/2017 CONSULTATION DATE:  03/26/2017  REFERRING MD:  Dr. Donalee Citrin  REASON FOR CONSULTATION:  Subdural hematoma, postoperative care   ASSESSMENT/PLAN:  ASSESSMENT (included in the Hospital Problem List)  Principal Problem:   Subdural hematoma (HCC) Active Problems:   Global aphasia   Hypertension   Orthostatic hypotension   Chronic kidney disease (CKD), stage III (moderate) (HCC)    PLAN/RECOMMENDATIONS   HOB 30+ degrees at all times  IV fluids: Decrease NS + 20 mEq KCl to 40 mL/hr  Continue amlodipine, irbesartan/HCTZ  Continue rosuvastatin  PT/OT eval & treat   NUTRITION: PO diet   DVT PROPHYLAXIS: SCDs  GI PROPHYLAXIS: Protonix  DISPOSITION: OK to transfer to the floor from pulmonary/critical care standpoint    SUBJECTIVE:   -Interim events: continues to do well. Ate breakfast. Off Cardene gtt. Follows commands. Conversant. Oriented to time, person and place.   HISTORY OF PRESENT ILLNESS 82 year old male with past medical history significant for H. pylori, GI hemorrhage secondary to an acute pyloric ulcer, presented on 03/26/2017 with primary complaints of aphasia and confusion.  CT head identified large acute left subdural hematoma with midline shift.  Patient was taken for emergent craniotomy by neurosurgery. PCCM consulted for postop management.  REVIEW OF SYSTEMS: unobtainable   PAST MEDICAL/SURGICAL/SOCIAL/FAMILY HISTORY  Past Medical History:  Diagnosis Date  . Hypertension   . Renal insufficiency     Past Surgical History:  Procedure Laterality Date  . CRANIOTOMY Left 03/26/2017   Procedure: CRANIOTOMY HEMATOMA EVACUATION SUBDURAL;  Surgeon: Donalee Citrin, MD;  Location: [facility] OR;  Service: Neurosurgery;  Laterality: Left;  . ESOPHAGOGASTRODUODENOSCOPY N/A 07/11/2014   Procedure:  ESOPHAGOGASTRODUODENOSCOPY (EGD);  Surgeon: Charlott Rakes, MD;  Location: [facility];  Service: Endoscopy;  Laterality: N/A;    Social History   Tobacco Use  . Smoking status: Never Smoker  . Smokeless tobacco: Never Used  Substance Use Topics  . Alcohol use: No    History reviewed. No pertinent family history.  Prior to Admission medications   Medication Sig Start Date End Date Taking? Authorizing Provider  amLODipine (NORVASC) 10 MG tablet Take 10 mg by mouth at bedtime. 01/03/17  Yes [provider]  loratadine (CLARITIN) 10 MG tablet Take 10 mg by mouth daily as needed for allergies.    Yes [provider]  olmesartan-hydrochlorothiazide (BENICAR HCT) 40-12.5 MG tablet Take 1 tablet by mouth daily. 03/05/17  Yes [provider]  paricalcitol (ZEMPLAR) 1 MCG capsule Take 1 mcg by mouth daily. 05/26/14  Yes [provider]  rosuvastatin (CRESTOR) 10 MG tablet Take 10 mg by mouth daily. 12/19/16  Yes [provider]  pantoprazole (PROTONIX) 40 MG tablet Take 1 tablet (40 mg total) by mouth 2 (two) times daily. TAKE 1 TABLET 2 TIMES DAILY X 3 MONTHS THEN 1 TABLET DAILY AFTER. Patient not taking: Reported on 03/26/2017 07/27/14   Rodolph Bong, MD    No Known Allergies   Current Facility-Administered Medications:  .  0.9 %  sodium chloride infusion, , Intravenous, Continuous, Jairo Ben, MD .  0.9 % NaCl with KCl 20 mEq/ L  infusion, , Intravenous, Continuous, Marcelle Smiling, MD, Last Rate: 40 mL/hr at 03/29/17 1000 .  amLODipine (NORVASC) tablet 10 mg, 10 mg, Oral, QHS, Donalee Citrin, MD, 10 mg at 03/28/17 2223 .  [COMPLETED] dexamethasone (DECADRON) injection 6  mg, 6 mg, Intravenous, Q6H, 6 mg at 03/27/17 1846 **FOLLOWED BY** [COMPLETED] dexamethasone (DECADRON) injection 4 mg, 4 mg, Intravenous, Q6H, 4 mg at 03/28/17 1814 **FOLLOWED BY** dexamethasone (DECADRON) injection 4 mg, 4 mg, Intravenous, Q8H, Donalee Citrin, MD, 4 mg at  03/29/17 0629 .  docusate sodium (COLACE) capsule 100 mg, 100 mg, Oral, BID, Donalee Citrin, MD, 100 mg at 03/28/17 2223 .  irbesartan (AVAPRO) tablet 300 mg, 300 mg, Oral, Daily, 300 mg at 03/28/17 0917 **AND** hydrochlorothiazide (MICROZIDE) capsule 12.5 mg, 12.5 mg, Oral, Daily, Donalee Citrin, MD, 12.5 mg at 03/28/17 0918 .  HYDROmorphone (DILAUDID) injection 0.5 mg, 0.5 mg, Intravenous, Q2H PRN, Donalee Citrin, MD .  labetalol (NORMODYNE,TRANDATE) injection 10-40 mg, 10-40 mg, Intravenous, Q10 min PRN, Donalee Citrin, MD, 20 mg at 03/29/17 1610 .  levETIRAcetam (KEPPRA) 500 mg in sodium chloride 0.9 % 100 mL IVPB, 500 mg, Intravenous, Q12H, Donalee Citrin, MD, Stopped at 03/29/17 [phone] .  loratadine (CLARITIN) tablet 10 mg, 10 mg, Oral, Daily PRN, Donalee Citrin, MD .  nicardipine (CARDENE) 20mg  in 0.86% saline IV infusion (0.1 mg/ml), 3-15 mg/hr, Intravenous, Continuous, Arsenio Loader Ananias Pilgrim, MD, Stopped at 03/27/17 0845 .  ondansetron (ZOFRAN) tablet 4 mg, 4 mg, Oral, Q4H PRN **OR** ondansetron (ZOFRAN) injection 4 mg, 4 mg, Intravenous, Q4H PRN, Donalee Citrin, MD .  pantoprazole (PROTONIX) injection 40 mg, 40 mg, Intravenous, Carma Lair, MD, 40 mg at 03/28/17 2223 .  paricalcitol (ZEMPLAR) capsule 1 mcg, 1 mcg, Oral, Daily, Donalee Citrin, MD, 1 mcg at 03/28/17 [phone] .  promethazine (PHENERGAN) tablet 12.5-25 mg, 12.5-25 mg, Oral, Q4H PRN, Donalee Citrin, MD .  rosuvastatin (CRESTOR) tablet 10 mg, 10 mg, Oral, QHS, Donalee Citrin, MD, 10 mg at 03/28/17 2223   OBJECTIVE:   VITAL SIGNS: BP (!) 139/93   Pulse 63   Temp (!) 97.5 F (36.4 C) (Oral)   Resp 11   Ht 6\' 1"  (1.854 m)   Wt 80.8 kg (178 lb 2.1 oz)   SpO2 100%   BMI 23.50 kg/m  Vitals:   03/29/17 0700 03/29/17 0800 03/29/17 0900 03/29/17 1000  BP: 138/84 (!) 178/96 (!) 163/102 (!) 139/93  Pulse: 65 67 81 63  Resp: 13 15 17 11   Temp:  (!) 97.5 F (36.4 C)    TempSrc:  Oral    SpO2: 100% 100% 100% 100%  Weight:      Height:        HEMODYNAMICS:     VENTILATOR SETTINGS:    INTAKE / OUTPUT: I/O last 3 completed shifts: In: 2046.8 [P.O.:120; I.V.:1611.8; IV Piggyback:315] Out: 1045 [Urine:950; Drains:95]  PHYSICAL EXAMINATION: General:  Awake, alert. Oriented to time, person, place. No acute distress. Normal affect. Pleasant demeanor. Head: s/p craniotomy EYE: PERRLA, EOM intact, no scleral icterus, no pallor Nose: nares are patent. No exudate.  Throat/Oral Cavity: Normal dentition. No oral thrush. No exudate. Mucous membranes are moist. No tonsillar enlargement. Neck: supple, no thyromegaly, no JVD, no lymphadenopathy. Trachea midline. Chest/Lung: symmetric in development and expansion. Good air entry. No use of accessory muscles. No crackles. No wheezes. No dullness to percussion. Heart: Regular S1 and S2 without murmur, rub or gallop. Abdomen: soft, nontender, nondistended. Normoactive bowel sounds. n rebound. No guarding. No hepatosplenomegaly. Extremities: no pedal edema, no cyanosis, no clubbing. 2+ DP pulses Lymphatic: no cervical/axiallary/inguinal lymph nodes appreciated Musculoskeletal:  No deformities. Skin:  Warm. Dry. No rash or lesion. NEURO: facial symmetry. Cough and gag intact. R Babinski. Equivocal L plantar  reflex. No sensory deficit. No motor deficit. DTR 2+ @ RUE, 2+ @ LUE 2+ @ RLL,  2+ @ LLL. Gait was not assessed.   LABS:  BMET Recent Labs  Lab 03/26/17 1701 03/26/17 1719 03/27/17 0029 03/27/17 0533  NA 136 139 138 138  K 4.0 4.2 3.8 4.1  CL 102 102 107 109  CO2 22  --  19* 20*  BUN 27* 30* 25* 24*  CREATININE 1.78* 1.60* 1.52* 1.43*  GLUCOSE 187* 193* 144* 176*    Electrolytes Recent Labs  Lab 03/26/17 1701 03/27/17 0029 03/27/17 0533  CALCIUM 9.3 8.0* 8.3*  MG  --   --  1.9  PHOS  --   --  3.2    CBC Recent Labs  Lab 03/26/17 1701 03/26/17 1719 03/27/17 0029 03/27/17 0533  WBC 6.5  --  6.4 7.7  HGB 13.7 15.3 10.8* 10.9*  HCT 41.5 45.0 32.6* 33.5*  PLT 194  --  153 154     Coag's Recent Labs  Lab 03/26/17 1701  APTT 36  INR 1.07    Sepsis Markers No results for input(s): LATICACIDVEN, PROCALCITON, O2SATVEN in the last 168 hours.  ABG No results for input(s): PHART, PCO2ART, PO2ART in the last 168 hours.  Liver Enzymes Recent Labs  Lab 03/26/17 1701  AST 18  ALT 10*  ALKPHOS 84  BILITOT 1.1  ALBUMIN 3.8    Cardiac Enzymes No results for input(s): TROPONINI, PROBNP in the last 168 hours.  Glucose Recent Labs  Lab 03/26/17 1657  GLUCAP 147*    Imaging Dg Chest Port 1 View  Result Date: 03/28/2017 CLINICAL DATA:  Respiratory failure.  Postoperative status. EXAM: PORTABLE CHEST 1 VIEW COMPARISON:  None. FINDINGS: The heart size and mediastinal contours are within normal limits. Both lungs are clear. No pneumothorax or pleural effusion is noted. The visualized skeletal structures are unremarkable. IMPRESSION: No acute cardiopulmonary abnormality seen. Electronically Signed   By: Lupita RaiderJames  Green Jr, M.D.   On: 03/28/2017 14:17    CULTURES: Results for orders placed or performed during the hospital encounter of 03/26/17  MRSA PCR Screening     Status: None   Collection Time: 03/27/17  1:06 AM  Result Value Ref Range Status   MRSA by PCR NEGATIVE NEGATIVE Final    Comment:        The GeneXpert MRSA Assay (FDA approved for NASAL specimens only), is one component of a comprehensive MRSA colonization surveillance program. It is not intended to diagnose MRSA infection nor to guide or monitor treatment for MRSA infections.     OTHER STUDIES:  -  ANTIBIOTICS: -  SIGNIFICANT EVENTS: 1/14 >> admitted with L SDH. Emergent craniotomy 1/15 >> dramatic neurologic improvement  LINES/TUBES: -  My assessment, plan of care, findings, medications, side effects, etc. were discussed with:  Nurse   Marcelle SmilingSeong-Joo Daquisha Clermont, MD Board Certified by the ABIM, Pulmonary Diseases & Critical Care Medicine  [facility] Pager: [phone]  [phone]  03/29/2017, 10:10 AM

## 2017-03-30 DIAGNOSIS — N183 Chronic kidney disease, stage 3 unspecified: Secondary | ICD-10-CM | POA: Diagnosis present

## 2017-03-30 DIAGNOSIS — N1831 Chronic kidney disease, stage 3a: Secondary | ICD-10-CM

## 2017-03-30 HISTORY — DX: Chronic kidney disease, stage 3 unspecified: N18.30

## 2017-03-30 HISTORY — DX: Chronic kidney disease, stage 3a: N18.31

## 2017-03-30 NOTE — Progress Notes (Signed)
NEUROSURGERY PROGRESS NOTE  Doing well. Complains of appropriate minimal headache.  Ambulating and voiding well Good strength and sensation Incision CDI  Temp:  [97.6 F (36.4 C)-98.5 F (36.9 C)] 98 F (36.7 C) (01/18 0758) Pulse Rate:  [63-87] 79 (01/18 0758) Resp:  [11-28] 14 (01/18 0758) BP: (116-183)/(72-100) 121/72 (01/18 0758) SpO2:  [99 %-100 %] 99 % (01/18 0758) Weight:  [85.9 kg (189 lb 6 oz)] 85.9 kg (189 lb 6 oz) (01/17 1545)  Plan: Doing well. Working with PT/OT. Still having some expressive aphasia.   Sherryl MangesKimberly Hannah Oaklen Thiam, NP 03/30/2017 9:58 AM

## 2017-03-30 NOTE — Progress Notes (Deleted)
I do not see this abbreviation in any of the notes that I wrote or attested. Could you tell me which note you're looking at? In fact, I don't agree with AoCKD... which is presumably acute on chronic kidney disease.

## 2017-03-30 NOTE — Care Management Note (Addendum)
Case Management Note  Patient Details  Name: Juan MantleJimmy L Haslem MRN: [id number] Date of Birth: Jan 09, 1936  Subjective/Objective:  Admitted for  Subdural hematoma             Action/Plan: Prior to admission patient lived at home alone.  In to speak with patient daughter at bedside.  [facility] [address]  PCP is Gildardo Crankerharles Ross.  Home DME: walker-2 wheels. Received referral for Outpatient OT and SLP.  Discussed referral for DME 3 in 1 and referral for out patient SLP and OT.  Patient agrees with referral.  Offered choice to patient and [facility]  [facility] and arranged for [facility] in 1 to be delivered to patient room Prior to discharge.  [facility] and [facility]  Expected Discharge Date: 03/30/2017                 Expected Discharge Plan:  Home/Self Care  Discharge planning Services  CM Consult, Other - See comment(Outpatient referral for OT and SLP)  Post Acute Care Choice:  Durable Medical Equipment Choice offered to:  Patient, Adult Children  DME Arranged:  3-N-1 DME Agency:  [facility]  Status of Service:  Completed, signed off  Yancey FlemingsKimberly R Rogene Meth, BSN, RN Nurse Case Manager [phone] 03/30/2017, 12:05 PM

## 2017-03-30 NOTE — Progress Notes (Signed)
Occupational Therapy Treatment and Discharge Patient Details Name: Juan Goodwin MRN: [id number] DOB: [birth year] Today's Date: 03/30/2017    History of present illness 82 yo admitted with altered speech with left SDH s/p crani 1/14 with aphasia. PMHx: HTN, renal insufficiency   OT comments  This 82 yo male admitted with above presents today to OT making progress towards goals. He is doing well from a basic ADL standpoint with still some issues with cognition (short term memory). He will benefit from OPOT. Acute OT will sign off due to pt to D/C today.  Follow Up Recommendations  Outpatient OT;Supervision/Assistance - 24 hour    Equipment Recommendations  3 in 1 bedside commode       Precautions / Restrictions Precautions Precautions: Fall Restrictions Weight Bearing Restrictions: No       Mobility Bed Mobility     General bed mobility comments: Pt up in recliner upon my arrival  Transfers Overall transfer level: Needs assistance Equipment used: None Transfers: Sit to/from Stand Sit to Stand: Supervision            Balance Overall balance assessment: Modified Independent                            ADL either performed or assessed with clinical judgement   ADL Overall ADL's : Needs assistance/impaired     Grooming: Set up;Sitting;Wash/dry hands;Wash/dry face;Oral care           Upper Body Dressing : Set up;Supervision/safety;Standing   Lower Body Dressing: Supervision/safety;Set up;Sit to/from stand                 General ADL Comments: setup/S for all basic ADLs. He does report that he can use the 3n1 in his walk in shower if he needs it to sit on and he had a grab bar in shower he can use if needed as well.     Vision Baseline Vision/History: Wears glasses Wears Glasses: Reading only Patient Visual Report: No change from baseline            Cognition Arousal/Alertness: Awake/alert Behavior During Therapy: WFL for tasks  assessed/performed Overall Cognitive Status: Impaired/Different from baseline Area of Impairment: Memory                     Memory: Decreased short-term memory         General Comments: stated 3 words to pt and asked him remember them. He repeated them back to me. 2 minutes later when I asked him what they were he could only remember one of them                   Pertinent Vitals/ Pain       Pain Assessment: No/denies pain            Progress Toward Goals  OT Goals(current goals can now be found in the care plan section)  Progress towards OT goals: Progressing toward goals     Plan Discharge plan remains appropriate       AM-PAC PT "6 Clicks" Daily Activity     Outcome Measure   Help from another person eating meals?: None(only needs set up/S for all tasks) Help from another person taking care of personal grooming?: None Help from another person toileting, which includes using toliet, bedpan, or urinal?: None Help from another person bathing (including washing, rinsing, drying)?: None Help from another person to put on and  taking off regular upper body clothing?: None Help from another person to put on and taking off regular lower body clothing?: None 6 Click Score: 24    End of Session Equipment Utilized During Treatment: (none)  OT Visit Diagnosis: Other symptoms and signs involving cognitive function   Activity Tolerance Patient tolerated treatment well   Patient Left in chair;with call bell/phone within reach   Nurse Communication (pt finished with OT and in recliner eating without alarm pad (RN ok with this))        Time: 1246-1315 OT Time Calculation (min): 29 min  Charges: OT General Charges $OT Visit: 1 Visit OT Treatments $Self Care/Home Management : 23-37 mins  Ignacia PalmaCathy Braelynn Lupton, OTR/L [phone] 03/30/2017

## 2017-03-30 NOTE — Progress Notes (Signed)
Physical Therapy Treatment Patient Details Name: Juan Goodwin MRN: [id number] DOB: 07-11-1935 Today's Date: 03/30/2017    History of Present Illness 82 yo admitted with altered speech with left SDH s/p crani 1/14 with aphasia. PMHx: HTN, renal insufficiency    PT Comments    Upon arriving at EOB, it is apparent that bed is soaked 2/2 loose condom cath. Nurse tech notified. Pt progresses towards PT goals today, tolerating 700-ft ambulation without c/o fatigue. Pt tolerates higher level balance activities, able to turn his head while walking and talking. Current discharge plan remains appropriate. PT will follow acutely while in hospital setting in order to promote safe mobility within hospital setting.    Follow Up Recommendations  Supervision/Assistance - 24 hour;No PT follow up     Equipment Recommendations  None recommended by PT    Recommendations for Other Services       Precautions / Restrictions Restrictions Weight Bearing Restrictions: No    Mobility  Bed Mobility Overal bed mobility: Needs Assistance Bed Mobility: Supine to Sit     Supine to sit: Supervision;HOB elevated     General bed mobility comments: supervision for lines/lead management.   Transfers Overall transfer level: Needs assistance Equipment used: None Transfers: Sit to/from Stand Sit to Stand: Supervision         General transfer comment: supervision for safety  Ambulation/Gait   Ambulation Distance (Feet): 700 Feet Assistive device: None Gait Pattern/deviations: WFL(Within Functional Limits) Gait velocity: decreased   General Gait Details: Pt able to walk and talk as well as avoid obstacles without LOB.    Stairs            Wheelchair Mobility    Modified Rankin (Stroke Patients Only) Modified Rankin (Stroke Patients Only) Pre-Morbid Rankin Score: No symptoms Modified Rankin: Moderate disability     Balance Overall balance assessment: Modified Independent                             High Level Balance Comments: Pt demonstrates ability to turn head horizontally to either side and vertically without change in gait speed. Pt performs single leg stance and is able to hold for ~ 5 seconds on both LLE and RLE. Pt able to hold tandem stance ~ 10 seconds before LOB. Pt recovers with steppage strategy.             Cognition Arousal/Alertness: Awake/alert Behavior During Therapy: WFL for tasks assessed/performed Overall Cognitive Status: No family/caregiver present to determine baseline cognitive functioning Area of Impairment: Memory                               General Comments: Pt unable to remember room number.       Exercises      General Comments        Pertinent Vitals/Pain Pain Assessment: No/denies pain    Home Living                      Prior Function            PT Goals (current goals can now be found in the care plan section) Progress towards PT goals: Progressing toward goals    Frequency    Min 3X/week      PT Plan Current plan remains appropriate    Co-evaluation  AM-PAC PT "6 Clicks" Daily Activity  Outcome Measure  Difficulty turning over in bed (including adjusting bedclothes, sheets and blankets)?: None Difficulty moving from lying on back to sitting on the side of the bed? : None Difficulty sitting down on and standing up from a chair with arms (e.g., wheelchair, bedside commode, etc,.)?: None Help needed moving to and from a bed to chair (including a wheelchair)?: A Little Help needed walking in hospital room?: A Little Help needed climbing 3-5 steps with a railing? : A Little 6 Click Score: 21    End of Session Equipment Utilized During Treatment: Gait belt Activity Tolerance: Patient tolerated treatment well Patient left: in chair;with call bell/phone within reach;with family/visitor present Nurse Communication: Other (comment)(bed wet) PT Visit  Diagnosis: Other abnormalities of gait and mobility (R26.89)     Time: [id number] PT Time Calculation (min) (ACUTE ONLY): 22 min  Charges:  $Gait Training: 8-22 mins                    G Codes:       Juan Goodwin, SPT   Juan Goodwin 03/30/2017, 12:10 PM

## 2017-04-05 ENCOUNTER — Other Ambulatory Visit: Payer: Self-pay | Admitting: Student

## 2017-04-05 ENCOUNTER — Other Ambulatory Visit: Payer: Self-pay | Admitting: Neurosurgery

## 2017-04-05 DIAGNOSIS — S065XAA Traumatic subdural hemorrhage with loss of consciousness status unknown, initial encounter: Secondary | ICD-10-CM

## 2017-04-05 DIAGNOSIS — S065X9A Traumatic subdural hemorrhage with loss of consciousness of unspecified duration, initial encounter: Secondary | ICD-10-CM

## 2017-04-06 ENCOUNTER — Ambulatory Visit
Admission: RE | Admit: 2017-04-06 | Discharge: 2017-04-06 | Disposition: A | Discharge status: Home / Self Care | Payer: Medicare Other | Source: Ambulatory Visit | Attending: Student | Admitting: Student

## 2017-04-06 DIAGNOSIS — S065X0A Traumatic subdural hemorrhage without loss of consciousness, initial encounter: Secondary | ICD-10-CM | POA: Diagnosis not present

## 2017-04-06 DIAGNOSIS — S065XAA Traumatic subdural hemorrhage with loss of consciousness status unknown, initial encounter: Secondary | ICD-10-CM

## 2017-04-06 DIAGNOSIS — S065X9A Traumatic subdural hemorrhage with loss of consciousness of unspecified duration, initial encounter: Secondary | ICD-10-CM

## 2017-04-10 ENCOUNTER — Observation Stay (HOSPITAL_COMMUNITY)
Admission: EM | Admit: 2017-04-10 | Discharge: 2017-04-11 | Disposition: A | Discharge status: Home Health | Payer: Medicare HMO | Attending: Internal Medicine | Admitting: Internal Medicine

## 2017-04-10 ENCOUNTER — Encounter (HOSPITAL_COMMUNITY): Payer: Self-pay | Admitting: *Deleted

## 2017-04-10 ENCOUNTER — Emergency Department (HOSPITAL_COMMUNITY): Payer: Medicare HMO

## 2017-04-10 DIAGNOSIS — X58XXXD Exposure to other specified factors, subsequent encounter: Secondary | ICD-10-CM | POA: Insufficient documentation

## 2017-04-10 DIAGNOSIS — Z79899 Other long term (current) drug therapy: Secondary | ICD-10-CM | POA: Insufficient documentation

## 2017-04-10 DIAGNOSIS — S065X9D Traumatic subdural hemorrhage with loss of consciousness of unspecified duration, subsequent encounter: Secondary | ICD-10-CM | POA: Diagnosis not present

## 2017-04-10 DIAGNOSIS — N189 Chronic kidney disease, unspecified: Secondary | ICD-10-CM | POA: Diagnosis present

## 2017-04-10 DIAGNOSIS — E860 Dehydration: Secondary | ICD-10-CM | POA: Diagnosis not present

## 2017-04-10 DIAGNOSIS — I959 Hypotension, unspecified: Secondary | ICD-10-CM | POA: Diagnosis present

## 2017-04-10 DIAGNOSIS — R4701 Aphasia: Secondary | ICD-10-CM | POA: Diagnosis not present

## 2017-04-10 DIAGNOSIS — D649 Anemia, unspecified: Secondary | ICD-10-CM | POA: Diagnosis not present

## 2017-04-10 DIAGNOSIS — R2689 Other abnormalities of gait and mobility: Secondary | ICD-10-CM | POA: Insufficient documentation

## 2017-04-10 DIAGNOSIS — N183 Chronic kidney disease, stage 3 (moderate): Secondary | ICD-10-CM | POA: Diagnosis not present

## 2017-04-10 DIAGNOSIS — R4182 Altered mental status, unspecified: Secondary | ICD-10-CM | POA: Diagnosis not present

## 2017-04-10 DIAGNOSIS — S065X9A Traumatic subdural hemorrhage with loss of consciousness of unspecified duration, initial encounter: Secondary | ICD-10-CM | POA: Diagnosis present

## 2017-04-10 DIAGNOSIS — R531 Weakness: Principal | ICD-10-CM | POA: Insufficient documentation

## 2017-04-10 DIAGNOSIS — N179 Acute kidney failure, unspecified: Secondary | ICD-10-CM | POA: Diagnosis present

## 2017-04-10 DIAGNOSIS — D631 Anemia in chronic kidney disease: Secondary | ICD-10-CM | POA: Insufficient documentation

## 2017-04-10 DIAGNOSIS — I10 Essential (primary) hypertension: Secondary | ICD-10-CM | POA: Diagnosis not present

## 2017-04-10 DIAGNOSIS — I129 Hypertensive chronic kidney disease with stage 1 through stage 4 chronic kidney disease, or unspecified chronic kidney disease: Secondary | ICD-10-CM | POA: Diagnosis not present

## 2017-04-10 DIAGNOSIS — I6200 Nontraumatic subdural hemorrhage, unspecified: Secondary | ICD-10-CM | POA: Diagnosis not present

## 2017-04-10 DIAGNOSIS — S065XAA Traumatic subdural hemorrhage with loss of consciousness status unknown, initial encounter: Secondary | ICD-10-CM | POA: Diagnosis present

## 2017-04-10 HISTORY — DX: Weakness: R53.1

## 2017-04-10 HISTORY — DX: Chronic kidney disease, unspecified: N18.9

## 2017-04-10 HISTORY — DX: Anemia, unspecified: D64.9

## 2017-04-10 HISTORY — DX: Dehydration: E86.0

## 2017-04-10 LAB — COMPREHENSIVE METABOLIC PANEL
ALT: 51 U/L (ref 17–63)
AST: 33 U/L (ref 15–41)
Albumin: 3.1 g/dL — ABNORMAL LOW (ref 3.5–5.0)
Alkaline Phosphatase: 82 U/L (ref 38–126)
Anion gap: 15 (ref 5–15)
BUN: 40 mg/dL — ABNORMAL HIGH (ref 6–20)
CO2: 22 mmol/L (ref 22–32)
Calcium: 9.8 mg/dL (ref 8.9–10.3)
Chloride: 97 mmol/L — ABNORMAL LOW (ref 101–111)
Creatinine, Ser: 2.2 mg/dL — ABNORMAL HIGH (ref 0.61–1.24)
GFR calc Af Amer: 31 mL/min — ABNORMAL LOW (ref >60)
GFR calc non Af Amer: 27 mL/min — ABNORMAL LOW (ref >60)
Glucose, Bld: 177 mg/dL — ABNORMAL HIGH (ref 65–99)
Potassium: 4.3 mmol/L (ref 3.5–5.1)
Sodium: 134 mmol/L — ABNORMAL LOW (ref 135–145)
Total Bilirubin: 0.3 mg/dL (ref 0.3–1.2)
Total Protein: 6.7 g/dL (ref 6.5–8.1)

## 2017-04-10 LAB — URINALYSIS, ROUTINE W REFLEX MICROSCOPIC
Bilirubin Urine: NEGATIVE
Glucose, UA: NEGATIVE mg/dL
Hgb urine dipstick: NEGATIVE
Ketones, ur: NEGATIVE mg/dL
Leukocytes, UA: NEGATIVE
Nitrite: NEGATIVE
Protein, ur: NEGATIVE mg/dL
Specific Gravity, Urine: 1.011 (ref 1.005–1.030)
pH: 5 (ref 5.0–8.0)

## 2017-04-10 LAB — CBC WITH DIFFERENTIAL/PLATELET
Basophils Absolute: 0 10*3/uL (ref 0.0–0.1)
Basophils Relative: 0 %
Eosinophils Absolute: 0 10*3/uL (ref 0.0–0.7)
Eosinophils Relative: 1 %
HCT: 36.2 % — ABNORMAL LOW (ref 39.0–52.0)
Hemoglobin: 12.2 g/dL — ABNORMAL LOW (ref 13.0–17.0)
Lymphocytes Relative: 21 %
Lymphs Abs: 1.5 10*3/uL (ref 0.7–4.0)
MCH: 26.6 pg (ref 26.0–34.0)
MCHC: 33.7 g/dL (ref 30.0–36.0)
MCV: 79 fL (ref 78.0–100.0)
Monocytes Absolute: 0.5 10*3/uL (ref 0.1–1.0)
Monocytes Relative: 6 %
Neutro Abs: 5.3 10*3/uL (ref 1.7–7.7)
Neutrophils Relative %: 72 %
Platelets: 285 10*3/uL (ref 150–400)
RBC: 4.58 MIL/uL (ref 4.22–5.81)
RDW: 13.9 % (ref 11.5–15.5)
WBC: 7.4 10*3/uL (ref 4.0–10.5)

## 2017-04-10 LAB — TROPONIN I: Troponin I: <0.03 ng/mL (ref <0.03)

## 2017-04-10 MED ORDER — ACETAMINOPHEN 650 MG RE SUPP: 650.0000 mg | Freq: Four times a day (QID) | RECTAL | Status: DC | PRN

## 2017-04-10 MED ORDER — PARICALCITOL 1 MCG PO CAPS
1.0000 ug | ORAL_CAPSULE | Freq: Every day | ORAL | Status: DC
  Administered 2017-04-11: 1 ug via ORAL
  Filled 2017-04-10: qty 1

## 2017-04-10 MED ORDER — LORATADINE 10 MG PO TABS: 10.0000 mg | ORAL_TABLET | Freq: Every day | ORAL | Status: DC | PRN

## 2017-04-10 MED ORDER — SODIUM CHLORIDE 0.9 % IV SOLN
INTRAVENOUS | Status: DC
  Administered 2017-04-10: 21:00:00 via INTRAVENOUS

## 2017-04-10 MED ORDER — IPRATROPIUM-ALBUTEROL 0.5-2.5 (3) MG/3ML IN SOLN: 3.0000 mL | RESPIRATORY_TRACT | Status: DC | PRN

## 2017-04-10 MED ORDER — ACETAMINOPHEN 325 MG PO TABS: 650.0000 mg | ORAL_TABLET | Freq: Four times a day (QID) | ORAL | Status: DC | PRN

## 2017-04-10 MED ORDER — SODIUM CHLORIDE 0.9 % IV BOLUS (SEPSIS)
500.0000 mL | Freq: Once | INTRAVENOUS | Status: AC
  Administered 2017-04-10: 500 mL via INTRAVENOUS

## 2017-04-10 MED ORDER — ONDANSETRON HCL 4 MG/2ML IJ SOLN: 4.0000 mg | Freq: Four times a day (QID) | INTRAMUSCULAR | Status: DC | PRN

## 2017-04-10 MED ORDER — SODIUM CHLORIDE 0.9 % IV BOLUS (SEPSIS)
1000.0000 mL | Freq: Once | INTRAVENOUS | Status: AC
  Administered 2017-04-10: 1000 mL via INTRAVENOUS

## 2017-04-10 MED ORDER — METHYLPREDNISOLONE 4 MG PO TBPK
4.0000 mg | ORAL_TABLET | ORAL | Status: AC
  Administered 2017-04-11 (×2): 4 mg via ORAL
  Filled 2017-04-10 (×2): qty 21

## 2017-04-10 MED ORDER — ONDANSETRON HCL 4 MG PO TABS: 4.0000 mg | ORAL_TABLET | Freq: Four times a day (QID) | ORAL | Status: DC | PRN

## 2017-04-10 NOTE — H&P (Addendum)
History and Physical    [name] ZOX:[id number] DOB: 11/12/1935 DOA: 04/10/2017  Referring MD/NP/PA: Dr. Benjiman Core  PCP: Daisy Floro, MD  Patient coming from: Dr. Lonie Peak office  Chief Complaint: weakness  I have personally briefly reviewed patient's old medical records in [facility]   HPI: Juan Goodwin is a 82 y.o. male with medical history significant of HTN, CKD stage III, and SDH s/p craniotomy on 1/14; who presents from Dr. Lonie Peak office for complaints of generalized weakness.  Patient had been admitted into the hospital on 1/14 for marked confusion and difficulty with speech after having a fall the night before apparently hitting his head.  That time he was found to have a large 1.5 cm left-sided acute subdural hematoma with 6 mm midline shift.   He underwent emergent craniotomy by Dr. Wynetta Emery and was discharged home on 1/18.  Since being home family noted that the patient has been more lethargic and sleeping most of the day away.  He has not been eating or drinking much as a result of this.  They report that he has been taking his medications of Benicar and amlodipine as prescribed.  He was scheduled to be set up with speech therapy as an outpatient as he still having some issues with word finding, but they have not come yet.  He had a follow-up repeat CT scan 4 days ago that showed a new low-density right sided subdural collection with new mild mass-effect in the right frontal lobe.  Family tried to keep the patient hydrated but they noted a difficult time getting him to drink.  Denies having any confusion, dysuria, nausea, vomiting, diarrhea, abdominal pain, cough, fever, chills, or new falls.  Family notes that he has been ambulating rolling walker and his daughter has been staying with him although he previously lived alone.  He gone into Dr. Dwana Curd office today for follow-up and partial staple removal.  Due to his symptoms he was recommended to come to the emergency  department for further evaluation and treatment.  ED Course: Upon admission into the emergency department patient was found to be afebrile, pulse 67-88, respirations 14-22, blood pressure 82/52-130/85, and O2 saturations 96-100%.  Labs revealed WBC 7.4, hemoglobin 12.2, platelets 285, sodium 134, potassium 4.3, chloride 97 CO2 22, BUN 40, creatinine 2.2.  Chest x-ray and urinalysis showed no clear signs of infection. Patient was given 1.5 L of normal saline IV fluids with improvement of blood pressures.    Review of Systems  Constitutional: Positive for malaise/fatigue. Negative for chills and fever.  HENT: Negative for ear discharge and nosebleeds.   Eyes: Positive for blurred vision.  Respiratory: Negative for cough and shortness of breath.   Cardiovascular: Negative for chest pain and leg swelling.  Gastrointestinal: Negative for abdominal pain, diarrhea, nausea and vomiting.  Genitourinary: Negative for dysuria and hematuria.  Musculoskeletal: Negative for falls and myalgias.  Neurological: Positive for speech change and weakness. Negative for focal weakness and headaches.  Psychiatric/Behavioral: Negative for substance abuse. The patient is not nervous/anxious.     Past Medical History:  Diagnosis Date  . Hypertension   . Renal insufficiency     Past Surgical History:  Procedure Laterality Date  . CRANIOTOMY Left 03/26/2017   Procedure: CRANIOTOMY HEMATOMA EVACUATION SUBDURAL;  Surgeon: Donalee Citrin, MD;  Location: [facility] OR;  Service: Neurosurgery;  Laterality: Left;  . ESOPHAGOGASTRODUODENOSCOPY N/A 07/11/2014   Procedure: ESOPHAGOGASTRODUODENOSCOPY (EGD);  Surgeon: Charlott Rakes, MD;  Location: [facility] ENDOSCOPY;  Service: Endoscopy;  Laterality: N/A;     reports that  has never smoked. he has never used smokeless tobacco. He reports that he does not drink alcohol. His drug history is not on file.  No Known Allergies  No family history on file.  Prior to Admission medications     Medication Sig Start Date End Date Taking? Authorizing Provider  amLODipine (NORVASC) 10 MG tablet Take 10 mg by mouth at bedtime. 01/03/17  Yes [provider]  loratadine (CLARITIN) 10 MG tablet Take 10 mg by mouth daily as needed for allergies.    Yes [provider]  methylPREDNISolone (MEDROL DOSEPAK) 4 MG TBPK tablet Take 4 mg by mouth as directed. x6 days. 04/06/17  Yes [provider]  olmesartan-hydrochlorothiazide (BENICAR HCT) 40-12.5 MG tablet Take 1 tablet by mouth daily. 03/05/17  Yes [provider]  paricalcitol (ZEMPLAR) 1 MCG capsule Take 1 mcg by mouth daily. 05/26/14  Yes [provider]  pantoprazole (PROTONIX) 40 MG tablet Take 1 tablet (40 mg total) by mouth 2 (two) times daily. TAKE 1 TABLET 2 TIMES DAILY X 3 MONTHS THEN 1 TABLET DAILY AFTER. Patient not taking: Reported on 04/10/2017 07/27/14   Rodolph Bong, MD    Physical Exam:  Constitutional: Elderly male in NAD, calm, comfortable Vitals:   04/10/17 1700 04/10/17 1800 04/10/17 1830 04/10/17 1900  BP: 128/80 124/82 130/85 129/84  Pulse: 67 70 67 67  Resp: 17 (!) 22 15 14   Temp:      TempSrc:      SpO2: 100% 99% 98% 100%   Eyes: PERRL, lids and conjunctivae normal ENMT: Mucous membranes are moist. Posterior pharynx clear of any exudate or lesions.  Neck: normal, supple, no masses, no thyromegaly Respiratory: clear to auscultation bilaterally, no wheezing, no crackles. Normal respiratory effort. No accessory muscle use.  Cardiovascular: Regular rate and rhythm, no murmurs / rubs / gallops. No extremity edema. 2+ pedal pulses. No carotid bruits.  Abdomen: no tenderness, no masses palpated. No hepatosplenomegaly. Bowel sounds positive.  Musculoskeletal: no clubbing / cyanosis. No joint deformity upper and lower extremities. Good ROM, no contractures. Normal muscle tone.  Patient with some mild expressive aphasia. Skin: no rashes, lesions, ulcers. No  induration Neurologic: CN 2-12 grossly intact. Sensation intact, DTR normal. Strength 5/5 in all 4.  Psychiatric: Normal judgment and insight. Alert and oriented x 3. Normal mood.     Labs on Admission: I have personally reviewed following labs and imaging studies  CBC: Recent Labs  Lab 04/10/17 1523  WBC 7.4  NEUTROABS 5.3  HGB 12.2*  HCT 36.2*  MCV 79.0  PLT 285   Basic Metabolic Panel: Recent Labs  Lab 04/10/17 1523  NA 134*  K 4.3  CL 97*  CO2 22  GLUCOSE 177*  BUN 40*  CREATININE 2.20*  CALCIUM 9.8   GFR: Estimated Creatinine Clearance: 30.8 mL/min (A) (by C-G formula based on SCr of 2.2 mg/dL (H)). Liver Function Tests: Recent Labs  Lab 04/10/17 1523  AST 33  ALT 51  ALKPHOS 82  BILITOT 0.3  PROT 6.7  ALBUMIN 3.1*   No results for input(s): LIPASE, AMYLASE in the last 168 hours. No results for input(s): AMMONIA in the last 168 hours. Coagulation Profile: No results for input(s): INR, PROTIME in the last 168 hours. Cardiac Enzymes: Recent Labs  Lab 04/10/17 1523  TROPONINI <0.03   BNP (last 3 results) No results for input(s): PROBNP in the last 8760 hours. HbA1C: No  results for input(s): HGBA1C in the last 72 hours. CBG: No results for input(s): GLUCAP in the last 168 hours. Lipid Profile: No results for input(s): CHOL, HDL, LDLCALC, TRIG, CHOLHDL, LDLDIRECT in the last 72 hours. Thyroid Function Tests: No results for input(s): TSH, T4TOTAL, FREET4, T3FREE, THYROIDAB in the last 72 hours. Anemia Panel: No results for input(s): VITAMINB12, FOLATE, FERRITIN, TIBC, IRON, RETICCTPCT in the last 72 hours. Urine analysis:    Component Value Date/Time   COLORURINE YELLOW 04/10/2017 1741   APPEARANCEUR CLEAR 04/10/2017 1741   LABSPEC 1.011 04/10/2017 1741   PHURINE 5.0 04/10/2017 1741   GLUCOSEU NEGATIVE 04/10/2017 1741   HGBUR NEGATIVE 04/10/2017 1741   BILIRUBINUR NEGATIVE 04/10/2017 1741   KETONESUR NEGATIVE 04/10/2017 1741   PROTEINUR  NEGATIVE 04/10/2017 1741   UROBILINOGEN 0.2 07/11/2014 0827   NITRITE NEGATIVE 04/10/2017 1741   LEUKOCYTESUR NEGATIVE 04/10/2017 1741   Sepsis Labs: No results found for this or any previous visit (from the past 240 hour(s)).   Radiological Exams on Admission: Dg Chest 2 View  Result Date: 04/10/2017 CLINICAL DATA:  AMS. Pt seemed alert during exam. Had no pains or complaints. Hx of HTN EXAM: CHEST  2 VIEW COMPARISON:  03/28/2017 FINDINGS: The heart size and mediastinal contours are within normal limits. Both lungs are clear. No pleural effusion or pneumothorax. The visualized skeletal structures are unremarkable. IMPRESSION: No active cardiopulmonary disease. Electronically Signed   By: Amie Portlandavid  Ormond M.D.   On: 04/10/2017 16:13   Ct Head Wo Contrast  Result Date: 04/10/2017 CLINICAL DATA:  Confusion. Increased weakness. Recent craniotomy for subdural hematoma evacuation. EXAM: CT HEAD WITHOUT CONTRAST TECHNIQUE: Contiguous axial images were obtained from the base of the skull through the vertex without intravenous contrast. COMPARISON:  04/06/2017 FINDINGS: Brain: Residual mixed density low subdural hematoma over the left cerebral convexity is predominantly hypoattenuating and measures up to 7 mm in thickness. The amount of extra-axial gas in this region has decreased while the amount of predominantly low-density fluid is stable to minimally increased. The overall volume of the residual subdural collection has not significantly changed. Mild mass effect on the left cerebral hemisphere is unchanged, and there remains no midline shift. Trace residual subdural blood along the falx is unchanged. Uniformly hypoattenuating subdural fluid collection over the right frontal convexity measures up to 8 mm in thickness, unchanged and with similar minimal mass effect on the underlying brain. Minimal density posteriorly in the left sylvian fissure is unchanged and again likely represents trace subarachnoid blood.  No new intracranial hemorrhage or acute large territory infarct is identified. The basilar cisterns are patent. Vascular: Calcified atherosclerosis at the skull base. No hyperdense vessel. Skull: Left frontotemporal craniotomy. Sinuses/Orbits: Visualized paranasal sinuses and mastoid air cells are clear. Orbits are unremarkable. Other: Left-sided scalp skin staples in place. IMPRESSION: 1. Unchanged volume of residual subdural hematoma over the left cerebral convexity. No midline shift. 2. Unchanged trace residual falcine subdural hemorrhage and left sylvian fissure subarachnoid hemorrhage. 3. Unchanged low-density right frontal convexity subdural fluid collection. 4. No evidence of new intracranial abnormality. Electronically Signed   By: Sebastian AcheAllen  Grady M.D.   On: 04/10/2017 17:08    EKG: Independently reviewed.  Sinus rhythm with left axis deviation at 75 bpm  Assessment/Plan Generalized weakness: Acute.  Patient presents with complaints of generalized weakness, lethargy, and poor overall p.o. intake.  Suspect dehydration is the likely cause of symptoms.  No reports of fever or confusion to give concern for infectious process. - Admit to  a telemetry bed  - neurochecks - PT/ OT to eval for safety - Care management consult for home needs  Acute kidney injury on chronic kidney disease stage III 2/2 dehydration: Patient's baseline creatinine appears to be around 1.4-1.8, but presents with a creatinine of 2.2 and BUN 40.  The elevated BUN to creatinine ratio showed suggest prerenal cause of symptoms.  - Continue normal saline IV fluids as tolerated - Strict I&O's  - recheck BMP in a.m. - Hold nephrotoxic agents  Hypotension, H/O essential hypertension: Resolving.  Patient's initial blood pressures noted to be as low as 82/52, but improved with IV fluids.  Suspect overdiuresis with poor p.o. intake medications like Benicar. - Hold amlodipine and Benicar  Subdural hematoma status post craniotomy:  Patient status post craniotomy on 1/14 by Dr. Wynetta Emery.  CT scan of the brain shows no acute changes of concern.   Patient still with residual expressive aphasia.  Patient scheduled to have residual staples taken out next week. - Continue steroid taper - Follow-up with Dr. Wynetta Emery as an outpatient as previously scheduled  Normocytic normochromic anemia: Hemoglobin 12.2 on admission which appears slightly higher than baseline which appear to be around 10 during last hospitalization 2 weeks ago.  Suspect this is secondary to hemoconcentration due to dehydration. - Recheck BMP in a.m.  DVT prophylaxis: SCDs  Code Status:  full Family Communication: Discussed plan of care with the patient family present at bedside Disposition Plan: Likely discharge home once medically stable Consults called: None Admission status: observation  Clydie Braun MD [facility] Pager [phone]   If 7PM-7AM, please contact night-coverage [email or web address] Password [facility]  04/10/2017, 7:44 PM

## 2017-04-10 NOTE — ED Triage Notes (Signed)
To ED from Dr Dola Argylerams office for eval of increased weakness and aloc. Pt s/p brain surgery. Released from hospital last week. Seen by Dr Wynetta Emeryram for eval of repeat CT Head - family told 'it was fine'. Pt appears sleepy. Will converse when spoken to. Denies pain.

## 2017-04-10 NOTE — ED Provider Notes (Signed)
[facility] Provider Note   CSN: [id number] Arrival date & time: 04/10/17  1413     History   Chief Complaint Chief Complaint  Patient presents with  . Weakness    HPI Juan Goodwin is a 82 y.o. male.  HPI Patient presents with worsening mental since.Around 2 weeks ago he had subdural hematomas that required surgery.  Since then his mental status is not gone back to baseline.  Has had decreased oral intake.  No nausea or vomiting.  Some confusion but no localizing numbness or weakness.  No diarrhea.  No dysuria.  Seen at Dr. Lonie Peakram's office today and sent in for further treatment and evaluation. Past Medical History:  Diagnosis Date  . Hypertension   . Renal insufficiency     Patient Active Problem List   Diagnosis Date Noted  . Chronic kidney disease (CKD), stage III (moderate) (HCC) 03/30/2017  . Hypertension 03/29/2017  . Orthostatic hypotension 03/27/2017  . Global aphasia 03/27/2017  . Subdural hematoma (HCC) 03/26/2017  . Acute pyloric channel ulcer 07/13/2014  . Hypokalemia 07/13/2014  . Acute upper GI bleed 07/13/2014  . ARF (acute renal failure) (HCC) 07/13/2014  . Hypernatremia 07/13/2014  . Hypotension 07/13/2014  . Thrombocytopenia (HCC) 07/13/2014  . Helicobacter pylori gastritis 07/13/2014  . Gastrointestinal hemorrhage with melena   . Melena 07/11/2014  . GI bleed 07/11/2014  . Acute blood loss anemia 07/10/2014    Past Surgical History:  Procedure Laterality Date  . CRANIOTOMY Left 03/26/2017   Procedure: CRANIOTOMY HEMATOMA EVACUATION SUBDURAL;  Surgeon: Donalee Citrinram, Gary, MD;  [facility] OR;  Service: Neurosurgery;  Laterality: Left;  . ESOPHAGOGASTRODUODENOSCOPY N/A 07/11/2014   Procedure: ESOPHAGOGASTRODUODENOSCOPY (EGD);  Surgeon: Charlott RakesVincent Schooler, MD;  [facility];  Service: Endoscopy;  Laterality: N/A;       Home Medications    Prior to Admission medications   Medication Sig Start Date End  Date Taking? Authorizing Provider  amLODipine (NORVASC) 10 MG tablet Take 10 mg by mouth at bedtime. 01/03/17  Yes [provider]  loratadine (CLARITIN) 10 MG tablet Take 10 mg by mouth daily as needed for allergies.    Yes [provider]  methylPREDNISolone (MEDROL DOSEPAK) 4 MG TBPK tablet Take 4 mg by mouth as directed. x6 days. 04/06/17  Yes [provider]  olmesartan-hydrochlorothiazide (BENICAR HCT) 40-12.5 MG tablet Take 1 tablet by mouth daily. 03/05/17  Yes [provider]  paricalcitol (ZEMPLAR) 1 MCG capsule Take 1 mcg by mouth daily. 05/26/14  Yes [provider]  pantoprazole (PROTONIX) 40 MG tablet Take 1 tablet (40 mg total) by mouth 2 (two) times daily. TAKE 1 TABLET 2 TIMES DAILY X 3 MONTHS THEN 1 TABLET DAILY AFTER. Patient not taking: Reported on 04/10/2017 07/27/14   Rodolph Bonghompson, Daniel V, MD    Family History No family history on file.  Social History Social History   Tobacco Use  . Smoking status: Never Smoker  . Smokeless tobacco: Never Used  Substance Use Topics  . Alcohol use: No  . Drug use: Not on file     Allergies   Patient has no known allergies.   Review of Systems Review of Systems  Constitutional: Positive for appetite change.  HENT: Negative for congestion.   Respiratory: Negative for shortness of breath.   Cardiovascular: Negative for chest pain.  Gastrointestinal: Negative for abdominal pain.  Neurological: Positive for headaches.  Psychiatric/Behavioral: Positive for confusion.     Physical Exam Updated Vital Signs  BP 129/84   Pulse 67   Temp 97.6 F (36.4 C) (Oral)   Resp 14   SpO2 100%   Physical Exam  Constitutional: He appears well-developed.  HENT:  Head: Normocephalic.  Well-healing surgical wound on left side of head.  Neck: Neck supple.  Cardiovascular: Normal rate.  Pulmonary/Chest: Effort normal.  Abdominal: Soft.  Musculoskeletal: He exhibits no edema.  Neurological: He  is alert.  Patient is awake and pleasant but somewhat slow to answer.  Will move all his extremities.  Able to recognize his family members.  Skin: Skin is warm. Capillary refill takes less than 2 seconds.     ED Treatments / Results  Labs (all labs ordered are listed, but only abnormal results are displayed) Labs Reviewed  COMPREHENSIVE METABOLIC PANEL - Abnormal; Notable for the following components:      Result Value   Sodium 134 (*)    Chloride 97 (*)    Glucose, Bld 177 (*)    BUN 40 (*)    Creatinine, Ser 2.20 (*)    Albumin 3.1 (*)    GFR calc non Af Amer 27 (*)    GFR calc Af Amer 31 (*)    All other components within normal limits  CBC WITH DIFFERENTIAL/PLATELET - Abnormal; Notable for the following components:   Hemoglobin 12.2 (*)    HCT 36.2 (*)    All other components within normal limits  URINALYSIS, ROUTINE W REFLEX MICROSCOPIC  TROPONIN I    EKG  EKG Interpretation  Date/Time:  Tuesday April 10 2017 15:39:09 EST Ventricular Rate:  75 PR Interval:    QRS Duration: 103 QT Interval:  376 QTC Calculation: 420 R Axis:   23 Text Interpretation:  Sinus rhythm Consider left atrial enlargement Minimal ST elevation, inferior leads Baseline wander in lead(s) V4 V5 V6 Confirmed by Benjiman Core [phone]) on 04/10/2017 4:01:09 PM       Radiology Dg Chest 2 View  Result Date: 04/10/2017 CLINICAL DATA:  AMS. Pt seemed alert during exam. Had no pains or complaints. Hx of HTN EXAM: CHEST  2 VIEW COMPARISON:  03/28/2017 FINDINGS: The heart size and mediastinal contours are within normal limits. Both lungs are clear. No pleural effusion or pneumothorax. The visualized skeletal structures are unremarkable. IMPRESSION: No active cardiopulmonary disease. Electronically Signed   By: Amie Portland M.D.   On: 04/10/2017 16:13   Ct Head Wo Contrast  Result Date: 04/10/2017 CLINICAL DATA:  Confusion. Increased weakness. Recent craniotomy for subdural hematoma evacuation.  EXAM: CT HEAD WITHOUT CONTRAST TECHNIQUE: Contiguous axial images were obtained from the base of the skull through the vertex without intravenous contrast. COMPARISON:  04/06/2017 FINDINGS: Brain: Residual mixed density low subdural hematoma over the left cerebral convexity is predominantly hypoattenuating and measures up to 7 mm in thickness. The amount of extra-axial gas in this region has decreased while the amount of predominantly low-density fluid is stable to minimally increased. The overall volume of the residual subdural collection has not significantly changed. Mild mass effect on the left cerebral hemisphere is unchanged, and there remains no midline shift. Trace residual subdural blood along the falx is unchanged. Uniformly hypoattenuating subdural fluid collection over the right frontal convexity measures up to 8 mm in thickness, unchanged and with similar minimal mass effect on the underlying brain. Minimal density posteriorly in the left sylvian fissure is unchanged and again likely represents trace subarachnoid blood. No new intracranial hemorrhage or acute large territory infarct is identified. The basilar cisterns  are patent. Vascular: Calcified atherosclerosis at the skull base. No hyperdense vessel. Skull: Left frontotemporal craniotomy. Sinuses/Orbits: Visualized paranasal sinuses and mastoid air cells are clear. Orbits are unremarkable. Other: Left-sided scalp skin staples in place. IMPRESSION: 1. Unchanged volume of residual subdural hematoma over the left cerebral convexity. No midline shift. 2. Unchanged trace residual falcine subdural hemorrhage and left sylvian fissure subarachnoid hemorrhage. 3. Unchanged low-density right frontal convexity subdural fluid collection. 4. No evidence of new intracranial abnormality. Electronically Signed   By: Sebastian Ache M.D.   On: 04/10/2017 17:08    Procedures Procedures (including critical care time)  Medications Ordered in ED Medications    sodium chloride 0.9 % bolus 500 mL (0 mLs Intravenous Stopped 04/10/17 1646)     Initial Impression / Assessment and Plan / ED Course  I have reviewed the triage vital signs and the nursing notes.  Pertinent labs & imaging results that were available during my care of the patient were reviewed by me and considered in my medical decision making (see chart for details).     Patient with mental status changes.  Appears to be dehydrated.  Has had decreased oral intake since surgery.  Creatinine is increased.  Hypotension improved.  However since he is really not eating and drinking enough at home I think he would benefit from short-term admission to hospital for IV hydration and further evaluation and treatment  Final Clinical Impressions(s) / ED Diagnoses   Final diagnoses:  Weakness  Dehydration    ED Discharge Orders    None      Benjiman Core, MD 04/10/17 [phone]

## 2017-04-10 NOTE — ED Notes (Signed)
Melissa - RN aware of pt's BP. 

## 2017-04-10 NOTE — ED Notes (Signed)
MD Pickering at the bedside 

## 2017-04-10 NOTE — ED Notes (Signed)
Gave pt. [place] sandwich bag and gingerale

## 2017-04-10 NOTE — ED Notes (Signed)
Juan Goodwin, call with bed assignment  [phone]

## 2017-04-10 NOTE — ED Notes (Signed)
Pt taken to Xray.

## 2017-04-11 ENCOUNTER — Encounter (HOSPITAL_COMMUNITY): Payer: Self-pay

## 2017-04-11 ENCOUNTER — Other Ambulatory Visit: Payer: Self-pay

## 2017-04-11 DIAGNOSIS — N189 Chronic kidney disease, unspecified: Secondary | ICD-10-CM

## 2017-04-11 DIAGNOSIS — R531 Weakness: Principal | ICD-10-CM

## 2017-04-11 DIAGNOSIS — E860 Dehydration: Secondary | ICD-10-CM

## 2017-04-11 DIAGNOSIS — I959 Hypotension, unspecified: Secondary | ICD-10-CM | POA: Diagnosis not present

## 2017-04-11 DIAGNOSIS — S065X9A Traumatic subdural hemorrhage with loss of consciousness of unspecified duration, initial encounter: Secondary | ICD-10-CM | POA: Diagnosis not present

## 2017-04-11 DIAGNOSIS — D649 Anemia, unspecified: Secondary | ICD-10-CM | POA: Diagnosis not present

## 2017-04-11 DIAGNOSIS — N179 Acute kidney failure, unspecified: Secondary | ICD-10-CM | POA: Diagnosis not present

## 2017-04-11 LAB — CBC
HCT: 34.2 % — ABNORMAL LOW (ref 39.0–52.0)
Hemoglobin: 11.4 g/dL — ABNORMAL LOW (ref 13.0–17.0)
MCH: 26.6 pg (ref 26.0–34.0)
MCHC: 33.3 g/dL (ref 30.0–36.0)
MCV: 79.9 fL (ref 78.0–100.0)
Platelets: 268 10*3/uL (ref 150–400)
RBC: 4.28 MIL/uL (ref 4.22–5.81)
RDW: 14.2 % (ref 11.5–15.5)
WBC: 6.9 10*3/uL (ref 4.0–10.5)

## 2017-04-11 LAB — BASIC METABOLIC PANEL
Anion gap: 8 (ref 5–15)
BUN: 33 mg/dL — ABNORMAL HIGH (ref 6–20)
CO2: 25 mmol/L (ref 22–32)
Calcium: 9.3 mg/dL (ref 8.9–10.3)
Chloride: 102 mmol/L (ref 101–111)
Creatinine, Ser: 1.67 mg/dL — ABNORMAL HIGH (ref 0.61–1.24)
GFR calc Af Amer: 43 mL/min — ABNORMAL LOW (ref >60)
GFR calc non Af Amer: 37 mL/min — ABNORMAL LOW (ref >60)
Glucose, Bld: 176 mg/dL — ABNORMAL HIGH (ref 65–99)
Potassium: 5.2 mmol/L — ABNORMAL HIGH (ref 3.5–5.1)
Sodium: 135 mmol/L (ref 135–145)

## 2017-04-11 LAB — TSH: TSH: 1.125 u[IU]/mL (ref 0.350–4.500)

## 2017-04-11 LAB — PREALBUMIN: Prealbumin: 23.8 mg/dL (ref 18–38)

## 2017-04-11 NOTE — ED Notes (Signed)
Pt's daughter called and made aware of room number and pt moved to floor

## 2017-04-11 NOTE — Care Management Note (Addendum)
Case Management Note  Patient Details  Name: Juan Goodwin MRN: [id number] Date of Birth: [birth year]  Subjective/Objective:      Presented with Weakness, hx of  HTN, CKD stage III, and SDH s/p craniotomy on 1/14. Resides with daughter [name]. Owns 3 in 1/bsc and walker.           [name] (Daughter) [name] (Sister)    [phone]        PCP: Gildardo Crankerharles Ross  Action/Plan: Transition to home with home health services to follow. [facility] referral inplace, liaison to see prior to d/c.  Expected Discharge Date:  04/11/17               Expected Discharge Plan:  Home w Home Health Services  [facility]-House Referral:   [facility]  Discharge planning Services  CM Consult  Post Acute Care Choice:    Choice offered to:  Patient, Adult Children  DME Arranged:    DME Agency:     HH Arranged:  [facility] RN, SLP [facility]  [facility]  Status of Service:  Completed, signed off  If discussed at Long Length of Stay Meetings, dates discussed:    Additional Comments:  Epifanio LeschesCole, Levy Wellman Hudson, RN 04/11/2017, 11:01 AM

## 2017-04-11 NOTE — Progress Notes (Signed)
Received report from Candace,RN in the ED. 

## 2017-04-11 NOTE — Progress Notes (Signed)
Juan Goodwin to be D/C'd Home per MD order.  Discussed with the patient and all questions fully answered.  VSS, Skin clean, dry and intact without evidence of skin break down, no evidence of skin tears noted. IV catheter discontinued intact. Site without signs and symptoms of complications. Dressing and pressure applied.  An After Visit Summary was printed and given to the patient. Patient received prescription.  D/c education completed with patient/family including follow up instructions, medication list, d/c activities limitations if indicated, with other d/c instructions as indicated by MD - patient able to verbalize understanding, all questions fully answered.   Patient instructed to return to ED, call 911, or call MD for any changes in condition.   Patient escorted via WC, and D/C home via private auto.  Juan Goodwin 04/11/2017 12:09 PM

## 2017-04-11 NOTE — Evaluation (Signed)
Clinical/Bedside Swallow Evaluation Patient Details  Name: Juan Goodwin MRN: [id number] Date of Birth: April 07, 1935  Today's Date: 04/11/2017 Time: SLP Start Time (ACUTE ONLY): 0947 SLP Stop Time (ACUTE ONLY): 1001 SLP Time Calculation (min) (ACUTE ONLY): 14 min  Past Medical History:  Past Medical History:  Diagnosis Date  . Hypertension   . Renal insufficiency    Past Surgical History:  Past Surgical History:  Procedure Laterality Date  . CRANIOTOMY Left 03/26/2017   Procedure: CRANIOTOMY HEMATOMA EVACUATION SUBDURAL;  Surgeon: Donalee Citrin, MD;  Location: [facility] OR;  Service: Neurosurgery;  Laterality: Left;  . ESOPHAGOGASTRODUODENOSCOPY N/A 07/11/2014   Procedure: ESOPHAGOGASTRODUODENOSCOPY (EGD);  Surgeon: Charlott Rakes, MD;  Location[facility];  Service: Endoscopy;  Laterality: N/A;   HPI:  Juan L Thomasis a 82 y.o.malewith medical history significant ofHTN, CKD stage III,andSDHs/pcraniotomyon 1/14;who presents from Dr.Cram's office for complaints of generalized weakness. Patient had been admitted into the hospital on 1/14 for marked confusion and difficulty with speech after having a fall the night before apparently hitting his head. That time he wasfound to have a large 1.5 cm left-sided acute subdural hematoma with 6 mm midline shift. He underwent emergent craniotomy by Dr. Wynetta Emery and was discharged home on 1/18. Since being home family noted that the patient has been more lethargic and sleeping most of the day away. He has not been eating or drinking much as a result of this. They report that he has been taking his medications of Benicar and amlodipine as prescribed. He was scheduled to be set up with speech therapy as an outpatient as he still having some issues with word finding,but they have not come yet. He had a follow-up repeat CT scan 4 days ago that showed a new low-density right sided subdural collection with new mild mass-effect in the right frontal lobe.  Family tried to keep the patient hydrated but they noted a difficult time getting him to drink. Denies having any confusion, dysuria, nausea, vomiting, diarrhea, abdominal pain, cough, fever, chills, or new falls.Family notes that he has been ambulating rolling walker and his daughter has been staying with him although he previously lived alone. He gone into Dr. Dwana Curd office today for follow-up and partial staple removal. Due to his symptoms he was recommended to come to the emergency department for further evaluation and treatment. CT revealed unchanged volume of residual subdural hematoma over the left, cerebral convexity, no midline shift, unchanged trace residual falcine subdural hemorrhage and left sylvian fissure subarachnoid hemorrhage and unchanged low-density right frontal convexity subdural fluid collection.   Assessment / Plan / Recommendation Clinical Impression  Pt presents with reduce risk of aspiration when following general aspiration precautions. Pt consumed trials of solid and 6 oz water via straw with no overt s/s of aspiration. Pt with history or dysphagia as well as cognitive linguistic therapy. Pt appeared with functional ability and comments that he has returned to baseline ability across all areas. No skilled ST is required at this time. ST to sign off.  SLP Visit Diagnosis: Dysphagia, unspecified (R13.10)    Aspiration Risk  No limitations;Mild aspiration risk    Diet Recommendation Regular;Thin liquid   Liquid Administration via: Straw;Cup Medication Administration: Whole meds with liquid Supervision: Patient able to self feed Compensations: Minimize environmental distractions;Slow rate;Small sips/bites Postural Changes: Seated upright at 90 degrees    Other  Recommendations Oral Care Recommendations: Oral care BID   Follow up Recommendations None      Frequency and Duration  Prognosis        Swallow Study   General Date of Onset:  04/10/17 HPI: Juan DowseJimmy L Thomasis a 82 y.o.malewith medical history significant ofHTN, CKD stage III,andSDHs/pcraniotomyon 1/14;who presents from Dr.Cram's office for complaints of generalized weakness. Patient had been admitted into the hospital on 1/14 for marked confusion and difficulty with speech after having a fall the night before apparently hitting his head. That time he wasfound to have a large 1.5 cm left-sided acute subdural hematoma with 6 mm midline shift. He underwent emergent craniotomy by Dr. Wynetta Emeryram and was discharged home on 1/18. Since being home family noted that the patient has been more lethargic and sleeping most of the day away. He has not been eating or drinking much as a result of this. They report that he has been taking his medications of Benicar and amlodipine as prescribed. He was scheduled to be set up with speech therapy as an outpatient as he still having some issues with word finding,but they have not come yet. He had a follow-up repeat CT scan 4 days ago that showed a new low-density right sided subdural collection with new mild mass-effect in the right frontal lobe. Family tried to keep the patient hydrated but they noted a difficult time getting him to drink. Denies having any confusion, dysuria, nausea, vomiting, diarrhea, abdominal pain, cough, fever, chills, or new falls.Family notes that he has been ambulating rolling walker and his daughter has been staying with him although he previously lived alone. He gone into Dr. Dwana CurdGraham's office today for follow-up and partial staple removal. Due to his symptoms he was recommended to come to the emergency department for further evaluation and treatment. CT revealed unchanged volume of residual subdural hematoma over the left, cerebral convexity, no midline shift, unchanged trace residual falcine subdural hemorrhage and left sylvian fissure subarachnoid hemorrhage and unchanged low-density right frontal convexity  subdural fluid collection. Type of Study: Bedside Swallow Evaluation Previous Swallow Assessment: no Diet Prior to this Study: Regular;Thin liquids Temperature Spikes Noted: No Respiratory Status: Room air History of Recent Intubation: No Behavior/Cognition: Alert Oral Cavity Assessment: Within Functional Limits Oral Cavity - Dentition: Adequate natural dentition Vision: Functional for self-feeding Self-Feeding Abilities: Able to feed self Patient Positioning: Upright in chair Baseline Vocal Quality: Normal Volitional Cough: Strong Volitional Swallow: Able to elicit    Oral/Motor/Sensory Function Overall Oral Motor/Sensory Function: Within functional limits   Ice Chips Ice chips: Within functional limits Presentation: Self Fed;Cup   Thin Liquid Thin Liquid: Within functional limits Presentation: Self Fed;Straw    Nectar Thick Nectar Thick Liquid: Not tested   Honey Thick Honey Thick Liquid: Not tested   Puree Puree: Within functional limits   Solid   GO   Solid: Within functional limits    Functional Assessment Tool Used: skilled clinical observation Functional Limitations: Swallowing Swallow Current Status (M5784(G8996): 0 percent impaired, limited or restricted Swallow Goal Status (O9629(G8997): 0 percent impaired, limited or restricted Swallow Discharge Status (B2841(G8998): 0 percent impaired, limited or restricted   Grady Mohabir 04/11/2017,10:42 AM

## 2017-04-11 NOTE — Discharge Summary (Signed)
Physician Discharge Summary  Juan Goodwin:[id number] DOB: Sep 03, 1935 DOA: 04/10/2017  PCP: Daisy Floro, MD  Admit date: 04/10/2017 Discharge date: 04/11/2017  Admitted From: Home Disposition: Home  Recommendations for Outpatient Follow-up:  1. Follow up with PCP in 1week with repeat CBC/BMP 2. Follow-up with neurosurgery/Dr. Wynetta Emery within a week 3. Wound care as per neurosurgery recommendations 4. Hold antihypertensives until evaluation by primary care provider   Home Health: Yes Equipment/Devices: None  Discharge Condition: Stable CODE STATUS: Full Diet recommendation: Heart Healthy  Brief/Interim Summary: 82 year old male with history of hypertension, CKD stage III and SDH status post Craniotomy on 03/26/2017 presented from Dr. Lonie Peak office for complaints of generalized weakness.  He was found to be hypotensive with acute kidney injury and was started on IV fluids.  He feels much better.  His blood pressure has improved.  His kidney function has improved.  He feels better enough to go home today.  He will be discharged home if he tolerates physical therapy today.  Discharge Diagnoses:  Principal Problem:   Weakness Active Problems:   Hypotension   Subdural hematoma (HCC)   Dehydration   Acute kidney injury superimposed on chronic kidney disease (HCC)   Normocytic normochromic anemia   Generalized weakness: -Probably from dehydration and poor oral intake.  Patient feels much better.  -PT evaluation pending.  Patient might need outpatient nursing and physical therapy evaluation and follow-up -If patient tolerates physical therapy today, patient will be discharged home  Acute kidney injury on chronic kidney disease stage III secondary to dehydration: -Creatinine has improved and is almost at baseline.  Currently on intravenous fluids.  Tolerating diet. -Outpatient follow-up of creatinine -Hold antihypertensives until evaluation by primary care provider    Hypotension in a patient with history of  essential hypertension:  -Blood pressure much better.  Antihypertensive plan as above resolving.    Subdural hematoma status post craniotomy: Patient status post craniotomy on 1/14 by Dr. Wynetta Emery.  CT scan of the brain shows no acute changes of concern.   Patient still with residual expressive aphasia.  Patient scheduled to have residual staples taken out next week. - Continue steroid taper - Follow-up with Dr. Wynetta Emery as an outpatient as previously scheduled  Normocytic normochromic anemia: -Probably due to anemia of chronic disease from chronic kidney disease -Hemoglobin stable.   Discharge Instructions  Discharge Instructions    Call MD for:  difficulty breathing, headache or visual disturbances   Complete by:  As directed    Call MD for:  extreme fatigue   Complete by:  As directed    Call MD for:  hives   Complete by:  As directed    Call MD for:  persistant dizziness or light-headedness   Complete by:  As directed    Call MD for:  persistant nausea and vomiting   Complete by:  As directed    Call MD for:  severe uncontrolled pain   Complete by:  As directed    Call MD for:  temperature >100.4   Complete by:  As directed    Diet - low sodium heart healthy   Complete by:  As directed    Discharge instructions   Complete by:  As directed    Wound care as per neurosurgery recommendations Outpatient follow-up with neurosurgery as scheduled   Increase activity slowly   Complete by:  As directed      Allergies as of 04/11/2017   No Known Allergies     Medication  List    STOP taking these medications   amLODipine 10 MG tablet Commonly known as:  NORVASC   olmesartan-hydrochlorothiazide 40-12.5 MG tablet Commonly known as:  BENICAR HCT   pantoprazole 40 MG tablet Commonly known as:  PROTONIX     TAKE these medications   loratadine 10 MG tablet Commonly known as:  CLARITIN Take 10 mg by mouth daily as needed for  allergies.   methylPREDNISolone 4 MG Tbpk tablet Commonly known as:  MEDROL DOSEPAK Take 4 mg by mouth as directed. x6 days.   paricalcitol 1 MCG capsule Commonly known as:  ZEMPLAR Take 1 mcg by mouth daily.      Follow-up Information    Daisy Florooss, Charles Alan, MD. Schedule an appointment as soon as possible for a visit in 1 week(s).   Specialty:  Family Medicine Why:  With repeat CBC/BMP Contact information: [address] [place] [address] [phone]        Donalee Citrinram, Gary, MD. Schedule an appointment as soon as possible for a visit in 1 week(s).   Specialty:  Neurosurgery Contact information: [address] [place] [address] [phone]          No Known Allergies  Consultations:  None   Procedures/Studies: Dg Chest 2 View  Result Date: 04/10/2017 CLINICAL DATA:  AMS. Pt seemed alert during exam. Had no pains or complaints. Hx of HTN EXAM: CHEST  2 VIEW COMPARISON:  03/28/2017 FINDINGS: The heart size and mediastinal contours are within normal limits. Both lungs are clear. No pleural effusion or pneumothorax. The visualized skeletal structures are unremarkable. IMPRESSION: No active cardiopulmonary disease. Electronically Signed   By: Amie Portlandavid  Ormond M.D.   On: 04/10/2017 16:13   Ct Head Wo Contrast  Result Date: 04/10/2017 CLINICAL DATA:  Confusion. Increased weakness. Recent craniotomy for subdural hematoma evacuation. EXAM: CT HEAD WITHOUT CONTRAST TECHNIQUE: Contiguous axial images were obtained from the base of the skull through the vertex without intravenous contrast. COMPARISON:  04/06/2017 FINDINGS: Brain: Residual mixed density low subdural hematoma over the left cerebral convexity is predominantly hypoattenuating and measures up to 7 mm in thickness. The amount of extra-axial gas in this region has decreased while the amount of predominantly low-density fluid is stable to minimally increased. The overall volume of the residual  subdural collection has not significantly changed. Mild mass effect on the left cerebral hemisphere is unchanged, and there remains no midline shift. Trace residual subdural blood along the falx is unchanged. Uniformly hypoattenuating subdural fluid collection over the right frontal convexity measures up to 8 mm in thickness, unchanged and with similar minimal mass effect on the underlying brain. Minimal density posteriorly in the left sylvian fissure is unchanged and again likely represents trace subarachnoid blood. No new intracranial hemorrhage or acute large territory infarct is identified. The basilar cisterns are patent. Vascular: Calcified atherosclerosis at the skull base. No hyperdense vessel. Skull: Left frontotemporal craniotomy. Sinuses/Orbits: Visualized paranasal sinuses and mastoid air cells are clear. Orbits are unremarkable. Other: Left-sided scalp skin staples in place. IMPRESSION: 1. Unchanged volume of residual subdural hematoma over the left cerebral convexity. No midline shift. 2. Unchanged trace residual falcine subdural hemorrhage and left sylvian fissure subarachnoid hemorrhage. 3. Unchanged low-density right frontal convexity subdural fluid collection. 4. No evidence of new intracranial abnormality. Electronically Signed   By: Sebastian AcheAllen  Grady M.D.   On: 04/10/2017 17:08   Ct Head Wo Contrast  Result Date: 04/06/2017 CLINICAL DATA:  82 year old male status post surgical evacuation  of left subdural hematoma on 03/26/2017. Lethargy. EXAM: CT HEAD WITHOUT CONTRAST TECHNIQUE: Contiguous axial images were obtained from the base of the skull through the vertex without intravenous contrast. COMPARISON:  Postoperative head CT 03/27/2017 FINDINGS: Brain: Left subdural drain in place on 03/27/2017 has been removed. Decreased but not resolved postoperative pneumocephalus and residual mixed density left side subdural hematoma. The left side subdural now is about 6 mm in thickness, versus up to 9 mm  previously. Hyperdense parafalcine subdural blood has largely resolved. There is trace subarachnoid hemorrhage suspected along the posterior left sylvian fissure (series 32, image 14) which probably was present on the prior study. The other basilar cisterns are normal, and improved patency of the prepontine and pre medullary cisterns is noted following resolved subdural blood in those locations. There is a new uniformly low density right subdural collection measuring 6-7 mm in thickness (coronal image 36). Associated mild new mass effect on the right hemisphere. However, there is no midline shift. No ventriculomegaly. No cortically based acute infarct identified. Vascular: Calcified atherosclerosis at the skull base. Skull: Sequelae of left frontotemporal craniotomy. No new osseous abnormality identified. Sinuses/Orbits: Improved left maxillary sinus aeration. Other Visualized paranasal sinuses and mastoids are stable and well pneumatized. Other: Postoperative changes to the left scalp with skin staples still in place. Orbits soft tissues remain normal. IMPRESSION: 1. New low-density right side subdural collection, 6-7 mm in thickness. Associated new mild mass effect on the right frontal lobe. 2. Improved left lateral, parafalcine, and basilar cistern subdural hematoma since the postoperative CT on 03/27/2017. Residual left lateral subdural now is 6 mm. Small volume residual pneumocephalus. 3. Trace subarachnoid hemorrhage suspected in the left sylvian fissure, probably present previously. 4. No midline shift, and improved basilar cistern patency since 03/27/2017. Study discussed by telephone with Neurosurgery PA [facility] on 04/06/2017 at 14:54 . Electronically Signed   By: Odessa Fleming M.D.   On: 04/06/2017 14:56   Ct Head Wo Contrast  Result Date: 03/27/2017 CLINICAL DATA:  Follow-up subdural hematoma. EXAM: CT HEAD WITHOUT CONTRAST TECHNIQUE: Contiguous axial images were obtained from the base of the skull  through the vertex without intravenous contrast. COMPARISON:  CT HEAD March 26, 2017 FINDINGS: BRAIN: Interval evacuation of LEFT subdural hematoma with extra-axial drain in place. 2 mm residual LEFT-to-RIGHT midline shift, decreased from 6 mm. Small volume LEFT extra-axial pneumocephalus and residual hematoma. 5 mm LEFT parafalcine dense subdural hematoma persists. New linear density LEFT cerebellum. Age indeterminate thalamus lacunar infarct, more conspicuous than prior CT though this may be related to re-expansion. No acute large vascular territory infarct. Basal cisterns are patent. Subdural density within included cervical spine. VASCULAR: Tortuous, possibly ectatic carotid siphons with mild calcific atherosclerosis. Tortuous vertebral arteries. SKULL: Interval LEFT frontoparietal craniotomy with subjacent Surgicel. LEFT scalp soft tissue swelling and skin staples. SINUSES/ORBITS: The mastoid air-cells and included paranasal sinuses are well-aerated.The included ocular globes and orbital contents are non-suspicious. OTHER: None. IMPRESSION: 1. Interval evacuation LEFT subdural hematoma with small residual component, extra-axial drain in place. 2 mm residual LEFT-to-RIGHT midline shift. 2. Stable 5 mm LEFT falcotentorial subdural hematoma. Linear LEFT cerebellar developmental venous anomaly versus hemorrhage. 3. Probable subdural hematoma extension to cervical spine. 4. Age indeterminate LEFT thalamus lacunar infarct. Electronically Signed   By: Awilda Metro M.D.   On: 03/27/2017 05:39   Ct Head Wo Contrast  Result Date: 03/26/2017 CLINICAL DATA:  82 y/o  M; altered level of consciousness. EXAM: CT HEAD WITHOUT CONTRAST TECHNIQUE: Contiguous axial  images were obtained from the base of the skull through the vertex without intravenous contrast. COMPARISON:  None. FINDINGS: Brain: Acute subdural hematoma over the left cerebral convexity, left falx, and left tentorium cerebelli. Hematoma measures up to 14  mm in the lateral frontal region. Mass effect from the hematoma effaces sulci, partially effaces the left lateral ventricle, and results and 6 mm of left-to-right midline shift. No large acute stroke, brain parenchymal hemorrhage, or focal mass effect of brain parenchyma identified. No downward herniation or effacement of basilar cisterns. Vascular: Calcific atherosclerosis of carotid siphons. No hyperdense vessel. Skull: Normal. Negative for fracture or focal lesion. Sinuses/Orbits: Left maxillary sinus mucous retention cyst and mild mucosal thickening of ethmoid air cells. Otherwise negative. Other: None. IMPRESSION: Acute subdural hematoma of left cerebral convexity, falx, and tentorium measuring up to 14 mm. Associated mass effect with 6 mm left-to-right midline shift. No herniation. Critical Value/emergent results were called by telephone at the time of interpretation on 03/26/2017 at 6:13 pm to Dr. Jacalyn Lefevre , who verbally acknowledged these results. Electronically Signed   By: Mitzi Hansen M.D.   On: 03/26/2017 18:15   Dg Chest Port 1 View  Result Date: 03/28/2017 CLINICAL DATA:  Respiratory failure.  Postoperative status. EXAM: PORTABLE CHEST 1 VIEW COMPARISON:  None. FINDINGS: The heart size and mediastinal contours are within normal limits. Both lungs are clear. No pneumothorax or pleural effusion is noted. The visualized skeletal structures are unremarkable. IMPRESSION: No acute cardiopulmonary abnormality seen. Electronically Signed   By: Lupita Raider, M.D.   On: 03/28/2017 14:17      Subjective: Patient seen and examined at bedside.  He feels better.  He wants to go home.  No overnight fever or vomiting.  Discharge Exam: Vitals:   04/11/17 0131 04/11/17 0505  BP: 132/76 112/76  Pulse: 89 70  Resp: 17 17  Temp: 98.6 F (37 C) 97.9 F (36.6 C)  SpO2: 100% 100%   Vitals:   04/11/17 0000 04/11/17 0030 04/11/17 0131 04/11/17 0505  BP: 123/86 135/83 132/76 112/76   Pulse: 86 71 89 70  Resp: 19 12 17 17   Temp:   98.6 F (37 C) 97.9 F (36.6 C)  TempSrc:   Oral Oral  SpO2: 100% 100% 100% 100%  Weight:   75.3 kg (166 lb)   Height:   6\' 2"  (1.88 m)     General: Pt is alert, awake, not in acute distress Cardiovascular: Rate controlled, S1/S2 + Respiratory: Bilateral decreased breath sounds at bases Abdominal: Soft, NT, ND, bowel sounds + Extremities: no edema, no cyanosis    The results of significant diagnostics from this hospitalization (including imaging, microbiology, ancillary and laboratory) are listed below for reference.     Microbiology: No results found for this or any previous visit (from the past 240 hour(s)).   Labs: BNP (last 3 results) No results for input(s): BNP in the last 8760 hours. Basic Metabolic Panel: Recent Labs  Lab 04/10/17 1523 04/11/17 0221  NA 134* 135  K 4.3 5.2*  CL 97* 102  CO2 22 25  GLUCOSE 177* 176*  BUN 40* 33*  CREATININE 2.20* 1.67*  CALCIUM 9.8 9.3   Liver Function Tests: Recent Labs  Lab 04/10/17 1523  AST 33  ALT 51  ALKPHOS 82  BILITOT 0.3  PROT 6.7  ALBUMIN 3.1*   No results for input(s): LIPASE, AMYLASE in the last 168 hours. No results for input(s): AMMONIA in the last 168 hours. CBC: Recent  Labs  Lab 04/10/17 1523 04/11/17 0221  WBC 7.4 6.9  NEUTROABS 5.3  --   HGB 12.2* 11.4*  HCT 36.2* 34.2*  MCV 79.0 79.9  PLT 285 268   Cardiac Enzymes: Recent Labs  Lab 04/10/17 1523  TROPONINI <0.03   BNP: Invalid input(s): POCBNP CBG: No results for input(s): GLUCAP in the last 168 hours. D-Dimer No results for input(s): DDIMER in the last 72 hours. Hgb A1c No results for input(s): HGBA1C in the last 72 hours. Lipid Profile No results for input(s): CHOL, HDL, LDLCALC, TRIG, CHOLHDL, LDLDIRECT in the last 72 hours. Thyroid function studies Recent Labs    04/11/17 0221  TSH 1.125   Anemia work up No results for input(s): VITAMINB12, FOLATE, FERRITIN, TIBC,  IRON, RETICCTPCT in the last 72 hours. Urinalysis    Component Value Date/Time   COLORURINE YELLOW 04/10/2017 1741   APPEARANCEUR CLEAR 04/10/2017 1741   LABSPEC 1.011 04/10/2017 1741   PHURINE 5.0 04/10/2017 1741   GLUCOSEU NEGATIVE 04/10/2017 1741   HGBUR NEGATIVE 04/10/2017 1741   BILIRUBINUR NEGATIVE 04/10/2017 1741   KETONESUR NEGATIVE 04/10/2017 1741   PROTEINUR NEGATIVE 04/10/2017 1741   UROBILINOGEN 0.2 07/11/2014 0827   NITRITE NEGATIVE 04/10/2017 1741   LEUKOCYTESUR NEGATIVE 04/10/2017 1741   Sepsis Labs Invalid input(s): PROCALCITONIN,  WBC,  LACTICIDVEN Microbiology No results found for this or any previous visit (from the past 240 hour(s)).   Time coordinating discharge: 35 minutes  SIGNED:   Glade Lloyd, MD  [facility] 04/11/2017, 10:33 AM Pager: [phone]  If 7PM-7AM, please contact night-coverage [email or web address] Password [email or web address]

## 2017-04-11 NOTE — Evaluation (Signed)
Physical Therapy Evaluation Patient Details Name: Juan Goodwin MRN: [id number] DOB: 08/09/1935 Today's Date: 04/11/2017   History of Present Illness  82yo male who presented from Dr. Windy Carina office with generalized weakness; of note, on 03/26/17 he had confusion and difficulty with speech after falling and hitting his head on the floor, had emergent craniotomy and was discharged on 03/30/17. CT does not show concerning changes per imaging notes. PMH HTN, renal insufficiency, hx craniotomy 03/26/17.   Clinical Impression   Patient received in bed, pleasant and willing to participate with skilled PT services today. He is able to complete functional bed mobility with S and extended time, but does require ModA for sit to stand from low surface today. Able to ambulate approximately 110f in the hallway with personal walker at self-selected pace without significant balance deficit or gait deviation noted. He was left up in the chair with chair alarm set, all needs otherwise met this morning. Patient will continue to benefit from skilled PT services during his hospitalization, and may also benefit from skilled HHPT services moving forward to further address functional deficits.     Follow Up Recommendations Home health PT    Equipment Recommendations  None recommended by PT    Recommendations for Other Services       Precautions / Restrictions Precautions Precautions: Fall Restrictions Weight Bearing Restrictions: No      Mobility  Bed Mobility Overal bed mobility: Needs Assistance Bed Mobility: Supine to Sit     Supine to sit: Supervision     General bed mobility comments: S for safety, extended time and effort   Transfers Overall transfer level: Needs assistance Equipment used: Rolling walker (2 wheeled) Transfers: Sit to/from Stand Sit to Stand: Mod assist         General transfer comment: did require ModA for sit to stand from low surface   Ambulation/Gait Ambulation/Gait  assistance: Supervision Ambulation Distance (Feet): 100 Feet Assistive device: Rolling walker (2 wheeled) Gait Pattern/deviations: WFL(Within Functional Limits)     General Gait Details: patient steady and safe at self-selected pace   Stairs            Wheelchair Mobility    Modified Rankin (Stroke Patients Only)       Balance Overall balance assessment: No apparent balance deficits (not formally assessed)                                           Pertinent Vitals/Pain Pain Assessment: Faces Pain Score: 0-No pain Faces Pain Scale: No hurt Pain Intervention(s): Limited activity within patient's tolerance;Monitored during session    Home Living Family/patient expects to be discharged to:: Assisted living Living Arrangements: Children Available Help at Discharge: Family;Available 24 hours/day;Friend(s) Type of Home: House Home Access: Level entry     Home Layout: One level Home Equipment: Walker - 2 wheels      Prior Function Level of Independence: Independent         Comments: ADLs and IADLs     Hand Dominance        Extremity/Trunk Assessment   Upper Extremity Assessment Upper Extremity Assessment: Defer to OT evaluation    Lower Extremity Assessment Lower Extremity Assessment: Overall WFL for tasks assessed    Cervical / Trunk Assessment Cervical / Trunk Assessment: Normal  Communication      Cognition Arousal/Alertness: Awake/alert Behavior During Therapy: WFL for tasks assessed/performed  Overall Cognitive Status: No family/caregiver present to determine baseline cognitive functioning Area of Impairment: Memory                     Memory: Decreased short-term memory Following Commands: Follows one step commands consistently     Problem Solving: Slow processing General Comments: poor short term memory noted throughout session, patient had difficulty remembering his room number when returning from walk in  hallway       General Comments      Exercises     Assessment/Plan    PT Assessment Patient needs continued PT services  PT Problem List Decreased safety awareness;Decreased mobility;Decreased coordination;Decreased cognition;Decreased activity tolerance       PT Treatment Interventions Gait training;Therapeutic exercise;Patient/family education;Stair training;Balance training;Functional mobility training;DME instruction;Therapeutic activities;Cognitive remediation    PT Goals (Current goals can be found in the Care Plan section)  Acute Rehab PT Goals Patient Stated Goal: return home PT Goal Formulation: With patient Time For Goal Achievement: 04/10/17 Potential to Achieve Goals: Good    Frequency Min 3X/week   Barriers to discharge        Co-evaluation               AM-PAC PT "6 Clicks" Daily Activity  Outcome Measure Difficulty turning over in bed (including adjusting bedclothes, sheets and blankets)?: Unable Difficulty moving from lying on back to sitting on the side of the bed? : Unable Difficulty sitting down on and standing up from a chair with arms (e.g., wheelchair, bedside commode, etc,.)?: Unable Help needed moving to and from a bed to chair (including a wheelchair)?: None Help needed walking in hospital room?: None Help needed climbing 3-5 steps with a railing? : A Little 6 Click Score: 14    End of Session Equipment Utilized During Treatment: Gait belt Activity Tolerance: Patient tolerated treatment well Patient left: in chair;with chair alarm set;with call bell/phone within reach   PT Visit Diagnosis: Other abnormalities of gait and mobility (R26.89)    Time: 4103-0131 PT Time Calculation (min) (ACUTE ONLY): 23 min   Charges:   PT Evaluation $PT Eval Moderate Complexity: 1 Mod PT Treatments $Gait Training: 8-22 mins   PT G Codes:        Deniece Ree PT, DPT, CBIS  Supplemental Physical Therapist [place]   Pager  [phone]

## 2017-04-11 NOTE — Progress Notes (Signed)
Occupational Therapy Evaluation Patient Details Name: Juan MantleJimmy L Goodwin MRN: [id number] DOB: [birth year] Today's Date: 04/11/2017    History of Present Illness 81yo male who presented from Dr. Lonie Peakram's office with generalized weakness; of note, on 03/26/17 he had confusion and difficulty with speech after falling and hitting his head on the floor, had emergent craniotomy and was discharged on 03/30/17. CT does not show concerning changes per imaging notes. PMH HTN, renal insufficiency, hx craniotomy 03/26/17.    Clinical Impression   PTA, pt independent with ADL and IADL, active in the community and drove. Pt overall S with ADL. Pt's goal is to return to PLOF. Recommend follow up with HHOT to reach goals of returning to baseline. Discussed need for HHOT with CM.    Follow Up Recommendations  Outpatient OT;Supervision/Assistance - 24 hour    Equipment Recommendations  3 in 1 bedside commode    Recommendations for Other Services       Precautions / Restrictions Precautions Precautions: Fall Precaution Comments: watch BP Restrictions Weight Bearing Restrictions: No      Mobility Bed Mobility Overal bed mobility: Needs Assistance Bed Mobility: Supine to Sit     Supine to sit: Supervision Sit to supine: Supervision   General bed mobility comments: S for safety, extended time and effort   Transfers Overall transfer level: Needs assistance Equipment used: Rolling walker (2 wheeled) Transfers: Sit to/from Stand Sit to Stand: Mod assist         General transfer comment: did require ModA for sit to stand from low surface     Balance Overall balance assessment: No apparent balance deficits (not formally assessed)                             High Level Balance Comments: Pt demonstrates ability to turn head horizontally to either side and vertically without change in gait speed. Pt performs single leg stance and is able to hold for ~ 5 seconds on both LLE and RLE. Pt able  to hold tandem stance ~ 10 seconds before LOB. Pt recovers with steppage strategy.            ADL either performed or assessed with clinical judgement   ADL Overall ADL's : Needs assistance/impaired Eating/Feeding: Set up;Supervision/ safety;Sitting   Grooming: Set up;Sitting;Wash/dry hands;Wash/dry face;Oral care   Upper Body Bathing: Set up;Supervision/ safety;Sitting   Lower Body Bathing: Min guard;Sit to/from stand   Upper Body Dressing : Set up;Supervision/safety;Standing   Lower Body Dressing: Supervision/safety;Set up;Sit to/from stand Lower Body Dressing Details (indicate cue type and reason): Pt able to reach forward and adjust socks without difficulty Toilet Transfer: Min guard;Ambulation(Simulated in room)           Functional mobility during ADLs: Min guard General ADL Comments: setup/S for all basic ADLs. He does report that he can use the 3n1 in his walk in shower if he needs it to sit on and he had a grab bar in shower he can use if needed as well.     Vision Baseline Vision/History: Wears glasses Wears Glasses: Reading only Patient Visual Report: No change from baseline Vision Assessment?: Yes Eye Alignment: Within Functional Limits Ocular Range of Motion: Within Functional Limits Tracking/Visual Pursuits: Able to track stimulus in all quads without difficulty;Other (comment)(Noted fatigue)     Perception     Praxis      Pertinent Vitals/Pain Faces Pain Scale: No hurt     Hand Dominance  Right   Extremity/Trunk Assessment         Cervical / Trunk Assessment Cervical / Trunk Assessment: Normal   Communication Communication Communication: HOH   Cognition Arousal/Alertness: Awake/alert Behavior During Therapy: WFL for tasks assessed/performed Overall Cognitive Status: No family/caregiver present to determine baseline cognitive functioning Area of Impairment: Memory                     Memory: Decreased short-term memory Following  Commands: Follows one step commands consistently     Problem Solving: Slow processing General Comments: poor short term memory noted throughout session, patient had difficulty remembering his room number when returning from walk in hallway    General Comments       Exercises     Shoulder Instructions      Home Living Family/patient expects to be discharged to:: Assisted living Living Arrangements: Children Available Help at Discharge: Family;Available 24 hours/day;Friend(s) Type of Home: House Home Access: Level entry     Home Layout: One level     Bathroom Shower/Tub: Tub/shower unit;Walk-in shower   Bathroom Toilet: Handicapped height     Home Equipment: Environmental consultant - 2 wheels   Additional Comments: Pt providing home set up and close friend confirming all information      Prior Functioning/Environment Level of Independence: Independent        Comments: ADLs and IADLs        OT Problem List: Decreased activity tolerance;Impaired balance (sitting and/or standing);Decreased cognition;Decreased safety awareness;Decreased knowledge of use of DME or AE      OT Treatment/Interventions: Self-care/ADL training;Therapeutic exercise;Energy conservation;DME and/or AE instruction;Therapeutic activities;Patient/family education;Visual/perceptual remediation/compensation    OT Goals(Current goals can be found in the care plan section) Acute Rehab OT Goals Patient Stated Goal: return home OT Goal Formulation: With patient Time For Goal Achievement: 04/11/17 Potential to Achieve Goals: Good  OT Frequency: Min 2X/week   Barriers to D/C:            Co-evaluation              AM-PAC PT "6 Clicks" Daily Activity     Outcome Measure Help from another person eating meals?: None(only needs set up/S for all tasks) Help from another person taking care of personal grooming?: None Help from another person toileting, which includes using toliet, bedpan, or urinal?: None Help  from another person bathing (including washing, rinsing, drying)?: None Help from another person to put on and taking off regular upper body clothing?: None Help from another person to put on and taking off regular lower body clothing?: None 6 Click Score: 24   End of Session Equipment Utilized During Treatment: (none) Nurse Communication: (pt finished with OT and in recliner eating without alarm pad (RN ok with this))  Activity Tolerance: Patient tolerated treatment well Patient left: in chair;with call bell/phone within reach  OT Visit Diagnosis: Other symptoms and signs involving cognitive function                Time: 1610-9604 OT Time Calculation (min): 12 min Charges:  OT General Charges $OT Visit: 1 Visit OT Evaluation $OT Eval Low Complexity: 1 Low G-Codes:     Dietrich Samuelson, OT/L  [phone] 04/11/2017  Drevin Ortner,HILLARY 04/11/2017, 4:09 PM

## 2017-04-11 NOTE — Care Management Obs Status (Signed)
MEDICARE OBSERVATION STATUS NOTIFICATION   Patient Details  Name: Juan MantleJimmy L Hudlow MRN: [id number] Date of Birth: 1936-01-31   Medicare Observation Status Notification Given:  Yes    Lawerance Sabalebbie Saavi Mceachron, RN 04/11/2017, 10:42 AM

## 2017-04-11 NOTE — Progress Notes (Signed)
Pt arrived to 5W04 from ED. Patient A&O to self, place, and situation. Telemetry box 14 applied and second verified by Greg CutterKaelin R.,RN. Skin assessment completed by 2 RNs. Pt has staples on the left side of his head from craniotomy earlier this month. Patient does not complain of any pain and is in no distress at this time. Pt able to answer some of the admission questions, but not all. Will continue to monitor and treat per MD orders.

## 2017-04-12 ENCOUNTER — Other Ambulatory Visit: Payer: Self-pay | Admitting: *Deleted

## 2017-04-12 NOTE — Patient Outreach (Signed)
[facility]) Care Management  04/12/2017  Katherine MantleJimmy L Persky 01/18/1936 [id number]  Admission 1/14-1/18 Subdural Hematoma and ED visit 1/29-1/30 observation for weakness.  Transition of care (Initial)  RN spoke with pt today and [facility] and the purpose for today's call. RN also obtained a verbal consent to speak with his daughter Unk Pintodrienne Grossberg. Caregiver daughter provided all information today and confirmed pt's upcoming appointments with his providers  (Dr. Tenny Crawoss 2/8-primary and Dr. Wynetta Emeryram 2/6-neurologist).  Daughter indicated [facility] will be involved however they have not contacted her for a home visit but pt will have speech and PT services. RN able to completed the transition of care template and further discussed [facility] concerning pt's ongoing medical needs. Based upon pt's history and recent events RN offered community home visits to further engage in pt's care aside from [facility] services. Daughter appreciative but feels pt will be "okay" with the [facility] involvement. RN offered to continue follow up calls for transition of care to continue to inquire on any possible needs or community resources that may be needed in the further such as mobile meals. Daughter verbalized an understanding and indicated she has moved back and is staying with the pt permanently so she is able to provide transportation and prepare pt's meals accordingly. Daughter states she is glad to know that she can call upon someone who has some medical knowledge and resources to further assist wit hher father. Daughter has agreed to ongoing transition of care calls at this time but again does not feel he needs community home visits at this time with the involved [facility] agency. . RN has informed daughter that RN will update pt's primary provider [facility] and generate and plan of care around pt's recent admission.  Goals and interventions were discussed for adherence related  to pt's post-op medications and medical appointments along with safety precautions to prevent risk of readmission.  Will follow up next week with ongoing transition of care call.  Elliot CousinLisa Lillar Bianca, RN Care Management Coordinator [facility] Main Office [phone]

## 2017-04-14 DIAGNOSIS — D631 Anemia in chronic kidney disease: Secondary | ICD-10-CM | POA: Diagnosis not present

## 2017-04-14 DIAGNOSIS — I129 Hypertensive chronic kidney disease with stage 1 through stage 4 chronic kidney disease, or unspecified chronic kidney disease: Secondary | ICD-10-CM | POA: Diagnosis not present

## 2017-04-14 DIAGNOSIS — N183 Chronic kidney disease, stage 3 (moderate): Secondary | ICD-10-CM | POA: Diagnosis not present

## 2017-04-14 DIAGNOSIS — S065X0D Traumatic subdural hemorrhage without loss of consciousness, subsequent encounter: Secondary | ICD-10-CM | POA: Diagnosis not present

## 2017-04-14 DIAGNOSIS — R296 Repeated falls: Secondary | ICD-10-CM | POA: Diagnosis not present

## 2017-04-14 DIAGNOSIS — R4701 Aphasia: Secondary | ICD-10-CM | POA: Diagnosis not present

## 2017-04-14 DIAGNOSIS — Z9181 History of falling: Secondary | ICD-10-CM | POA: Diagnosis not present

## 2017-04-15 DIAGNOSIS — S065X0D Traumatic subdural hemorrhage without loss of consciousness, subsequent encounter: Secondary | ICD-10-CM | POA: Diagnosis not present

## 2017-04-15 DIAGNOSIS — Z9181 History of falling: Secondary | ICD-10-CM | POA: Diagnosis not present

## 2017-04-15 DIAGNOSIS — R4701 Aphasia: Secondary | ICD-10-CM | POA: Diagnosis not present

## 2017-04-15 DIAGNOSIS — R296 Repeated falls: Secondary | ICD-10-CM | POA: Diagnosis not present

## 2017-04-15 DIAGNOSIS — D631 Anemia in chronic kidney disease: Secondary | ICD-10-CM | POA: Diagnosis not present

## 2017-04-15 DIAGNOSIS — N183 Chronic kidney disease, stage 3 (moderate): Secondary | ICD-10-CM | POA: Diagnosis not present

## 2017-04-15 DIAGNOSIS — I129 Hypertensive chronic kidney disease with stage 1 through stage 4 chronic kidney disease, or unspecified chronic kidney disease: Secondary | ICD-10-CM | POA: Diagnosis not present

## 2017-04-16 ENCOUNTER — Encounter: Payer: Self-pay | Admitting: *Deleted

## 2017-04-16 DIAGNOSIS — R4701 Aphasia: Secondary | ICD-10-CM | POA: Diagnosis not present

## 2017-04-16 DIAGNOSIS — S065X0D Traumatic subdural hemorrhage without loss of consciousness, subsequent encounter: Secondary | ICD-10-CM | POA: Diagnosis not present

## 2017-04-16 DIAGNOSIS — D631 Anemia in chronic kidney disease: Secondary | ICD-10-CM | POA: Diagnosis not present

## 2017-04-16 DIAGNOSIS — I129 Hypertensive chronic kidney disease with stage 1 through stage 4 chronic kidney disease, or unspecified chronic kidney disease: Secondary | ICD-10-CM | POA: Diagnosis not present

## 2017-04-16 DIAGNOSIS — R296 Repeated falls: Secondary | ICD-10-CM | POA: Diagnosis not present

## 2017-04-16 DIAGNOSIS — N183 Chronic kidney disease, stage 3 (moderate): Secondary | ICD-10-CM | POA: Diagnosis not present

## 2017-04-16 DIAGNOSIS — Z9181 History of falling: Secondary | ICD-10-CM | POA: Diagnosis not present

## 2017-04-17 DIAGNOSIS — I129 Hypertensive chronic kidney disease with stage 1 through stage 4 chronic kidney disease, or unspecified chronic kidney disease: Secondary | ICD-10-CM | POA: Diagnosis not present

## 2017-04-17 DIAGNOSIS — R296 Repeated falls: Secondary | ICD-10-CM | POA: Diagnosis not present

## 2017-04-17 DIAGNOSIS — S065X0D Traumatic subdural hemorrhage without loss of consciousness, subsequent encounter: Secondary | ICD-10-CM | POA: Diagnosis not present

## 2017-04-17 DIAGNOSIS — N183 Chronic kidney disease, stage 3 (moderate): Secondary | ICD-10-CM | POA: Diagnosis not present

## 2017-04-17 DIAGNOSIS — D631 Anemia in chronic kidney disease: Secondary | ICD-10-CM | POA: Diagnosis not present

## 2017-04-17 DIAGNOSIS — R4701 Aphasia: Secondary | ICD-10-CM | POA: Diagnosis not present

## 2017-04-17 DIAGNOSIS — Z9181 History of falling: Secondary | ICD-10-CM | POA: Diagnosis not present

## 2017-04-18 DIAGNOSIS — S065X0D Traumatic subdural hemorrhage without loss of consciousness, subsequent encounter: Secondary | ICD-10-CM | POA: Diagnosis not present

## 2017-04-18 DIAGNOSIS — I129 Hypertensive chronic kidney disease with stage 1 through stage 4 chronic kidney disease, or unspecified chronic kidney disease: Secondary | ICD-10-CM | POA: Diagnosis not present

## 2017-04-18 DIAGNOSIS — N183 Chronic kidney disease, stage 3 (moderate): Secondary | ICD-10-CM | POA: Diagnosis not present

## 2017-04-18 DIAGNOSIS — D631 Anemia in chronic kidney disease: Secondary | ICD-10-CM | POA: Diagnosis not present

## 2017-04-18 DIAGNOSIS — R296 Repeated falls: Secondary | ICD-10-CM | POA: Diagnosis not present

## 2017-04-18 DIAGNOSIS — Z9181 History of falling: Secondary | ICD-10-CM | POA: Diagnosis not present

## 2017-04-18 DIAGNOSIS — R4701 Aphasia: Secondary | ICD-10-CM | POA: Diagnosis not present

## 2017-04-19 ENCOUNTER — Other Ambulatory Visit: Payer: Self-pay | Admitting: *Deleted

## 2017-04-19 DIAGNOSIS — R296 Repeated falls: Secondary | ICD-10-CM | POA: Diagnosis not present

## 2017-04-19 DIAGNOSIS — I129 Hypertensive chronic kidney disease with stage 1 through stage 4 chronic kidney disease, or unspecified chronic kidney disease: Secondary | ICD-10-CM | POA: Diagnosis not present

## 2017-04-19 DIAGNOSIS — D631 Anemia in chronic kidney disease: Secondary | ICD-10-CM | POA: Diagnosis not present

## 2017-04-19 DIAGNOSIS — N183 Chronic kidney disease, stage 3 (moderate): Secondary | ICD-10-CM | POA: Diagnosis not present

## 2017-04-19 DIAGNOSIS — Z9181 History of falling: Secondary | ICD-10-CM | POA: Diagnosis not present

## 2017-04-19 DIAGNOSIS — S065X0D Traumatic subdural hemorrhage without loss of consciousness, subsequent encounter: Secondary | ICD-10-CM | POA: Diagnosis not present

## 2017-04-19 DIAGNOSIS — R4701 Aphasia: Secondary | ICD-10-CM | POA: Diagnosis not present

## 2017-04-19 NOTE — Patient Outreach (Addendum)
[facility]N) Care Management  04/19/2017  [name] 1936/02/08 [id number]  Transition of care (2nd week)  RN attempted transition of care outreach call today however unsuccessful. Will rescheduled another attempt this week for [facility]. RN able to leave a HIPAA approved voice message requesting a call back. Will inquire further at that time on possible needs.   Elliot CousinLisa Matthews, RN Care Management Coordinator [facility] Main Office [phone]

## 2017-04-20 ENCOUNTER — Encounter: Payer: Self-pay | Admitting: *Deleted

## 2017-04-20 ENCOUNTER — Other Ambulatory Visit: Payer: Self-pay | Admitting: Neurosurgery

## 2017-04-20 ENCOUNTER — Other Ambulatory Visit: Payer: Self-pay | Admitting: *Deleted

## 2017-04-20 ENCOUNTER — Ambulatory Visit: Payer: Self-pay | Admitting: *Deleted

## 2017-04-20 DIAGNOSIS — N179 Acute kidney failure, unspecified: Secondary | ICD-10-CM | POA: Diagnosis not present

## 2017-04-20 DIAGNOSIS — S065X9A Traumatic subdural hemorrhage with loss of consciousness of unspecified duration, initial encounter: Secondary | ICD-10-CM

## 2017-04-20 DIAGNOSIS — S065XAA Traumatic subdural hemorrhage with loss of consciousness status unknown, initial encounter: Secondary | ICD-10-CM

## 2017-04-20 DIAGNOSIS — Z09 Encounter for follow-up examination after completed treatment for conditions other than malignant neoplasm: Secondary | ICD-10-CM | POA: Diagnosis not present

## 2017-04-20 DIAGNOSIS — E860 Dehydration: Secondary | ICD-10-CM | POA: Diagnosis not present

## 2017-04-20 NOTE — Patient Outreach (Signed)
[facility]N) Care Management  04/20/2017  Juan MantleJimmy L Goodwin 10-May-1935 [id number]    RN spoke with pt's daughter today Juan Goodwin(Juan Goodwin) who provided an update on pt's progress. States she continue to work with Juan Goodwin for PT/Speech and has made a great improvement. States pt is stronger and his lower BP is "much better". States pt had his staples removed earlier this week with no complications and continue to recover well. RN able to review the plan of care, goals and adjust the interventions according to pt's progress. RN able to completed the initial assessment today and will continue to encouraged pt's adherence with the new interventions in place. Daughter aware RN is available for a more one-on-one home visit if pt's needs this service however she remains receptive to the ongoing transition of care calls at this time. No other inquires or request at this time. RN will continue to offer any community resources however none needed at this time. Will follow up next week with a scheduled telephone call.  Juan CousinLisa Christoffer Currier, RN Care Management Coordinator [facility] Main Office [phone]

## 2017-04-23 DIAGNOSIS — R4701 Aphasia: Secondary | ICD-10-CM | POA: Diagnosis not present

## 2017-04-23 DIAGNOSIS — R296 Repeated falls: Secondary | ICD-10-CM | POA: Diagnosis not present

## 2017-04-23 DIAGNOSIS — S065X0D Traumatic subdural hemorrhage without loss of consciousness, subsequent encounter: Secondary | ICD-10-CM | POA: Diagnosis not present

## 2017-04-23 DIAGNOSIS — Z9181 History of falling: Secondary | ICD-10-CM | POA: Diagnosis not present

## 2017-04-23 DIAGNOSIS — N183 Chronic kidney disease, stage 3 (moderate): Secondary | ICD-10-CM | POA: Diagnosis not present

## 2017-04-23 DIAGNOSIS — I129 Hypertensive chronic kidney disease with stage 1 through stage 4 chronic kidney disease, or unspecified chronic kidney disease: Secondary | ICD-10-CM | POA: Diagnosis not present

## 2017-04-23 DIAGNOSIS — D631 Anemia in chronic kidney disease: Secondary | ICD-10-CM | POA: Diagnosis not present

## 2017-04-24 DIAGNOSIS — R296 Repeated falls: Secondary | ICD-10-CM | POA: Diagnosis not present

## 2017-04-24 DIAGNOSIS — D631 Anemia in chronic kidney disease: Secondary | ICD-10-CM | POA: Diagnosis not present

## 2017-04-24 DIAGNOSIS — S065X0D Traumatic subdural hemorrhage without loss of consciousness, subsequent encounter: Secondary | ICD-10-CM | POA: Diagnosis not present

## 2017-04-24 DIAGNOSIS — I129 Hypertensive chronic kidney disease with stage 1 through stage 4 chronic kidney disease, or unspecified chronic kidney disease: Secondary | ICD-10-CM | POA: Diagnosis not present

## 2017-04-24 DIAGNOSIS — N183 Chronic kidney disease, stage 3 (moderate): Secondary | ICD-10-CM | POA: Diagnosis not present

## 2017-04-24 DIAGNOSIS — R4701 Aphasia: Secondary | ICD-10-CM | POA: Diagnosis not present

## 2017-04-24 DIAGNOSIS — Z9181 History of falling: Secondary | ICD-10-CM | POA: Diagnosis not present

## 2017-04-25 DIAGNOSIS — R4701 Aphasia: Secondary | ICD-10-CM | POA: Diagnosis not present

## 2017-04-25 DIAGNOSIS — I129 Hypertensive chronic kidney disease with stage 1 through stage 4 chronic kidney disease, or unspecified chronic kidney disease: Secondary | ICD-10-CM | POA: Diagnosis not present

## 2017-04-25 DIAGNOSIS — D631 Anemia in chronic kidney disease: Secondary | ICD-10-CM | POA: Diagnosis not present

## 2017-04-25 DIAGNOSIS — R296 Repeated falls: Secondary | ICD-10-CM | POA: Diagnosis not present

## 2017-04-25 DIAGNOSIS — N183 Chronic kidney disease, stage 3 (moderate): Secondary | ICD-10-CM | POA: Diagnosis not present

## 2017-04-25 DIAGNOSIS — Z9181 History of falling: Secondary | ICD-10-CM | POA: Diagnosis not present

## 2017-04-25 DIAGNOSIS — S065X0D Traumatic subdural hemorrhage without loss of consciousness, subsequent encounter: Secondary | ICD-10-CM | POA: Diagnosis not present

## 2017-04-26 DIAGNOSIS — R296 Repeated falls: Secondary | ICD-10-CM | POA: Diagnosis not present

## 2017-04-26 DIAGNOSIS — R4701 Aphasia: Secondary | ICD-10-CM | POA: Diagnosis not present

## 2017-04-26 DIAGNOSIS — N183 Chronic kidney disease, stage 3 (moderate): Secondary | ICD-10-CM | POA: Diagnosis not present

## 2017-04-26 DIAGNOSIS — I129 Hypertensive chronic kidney disease with stage 1 through stage 4 chronic kidney disease, or unspecified chronic kidney disease: Secondary | ICD-10-CM | POA: Diagnosis not present

## 2017-04-26 DIAGNOSIS — Z9181 History of falling: Secondary | ICD-10-CM | POA: Diagnosis not present

## 2017-04-26 DIAGNOSIS — S065X0D Traumatic subdural hemorrhage without loss of consciousness, subsequent encounter: Secondary | ICD-10-CM | POA: Diagnosis not present

## 2017-04-26 DIAGNOSIS — D631 Anemia in chronic kidney disease: Secondary | ICD-10-CM | POA: Diagnosis not present

## 2017-04-27 ENCOUNTER — Other Ambulatory Visit: Payer: Self-pay | Admitting: *Deleted

## 2017-04-27 NOTE — Patient Outreach (Signed)
[facility]N) Care Management  04/27/2017  Katherine MantleJimmy L Bogen 1935/06/20 [id number]    RN unsuccessful with outreach call today. Attempted to call both the home number left a HIPAA approved voice message requesting a call back. And unable to leave a message in the cell number. Will continue attempt to reach pt for ongoing [facility] services.  Elliot CousinLisa Sotero Brinkmeyer, RN Care Management Coordinator [facility] Main Office [phone]

## 2017-04-30 DIAGNOSIS — D631 Anemia in chronic kidney disease: Secondary | ICD-10-CM | POA: Diagnosis not present

## 2017-04-30 DIAGNOSIS — Z9181 History of falling: Secondary | ICD-10-CM | POA: Diagnosis not present

## 2017-04-30 DIAGNOSIS — R296 Repeated falls: Secondary | ICD-10-CM | POA: Diagnosis not present

## 2017-04-30 DIAGNOSIS — R4701 Aphasia: Secondary | ICD-10-CM | POA: Diagnosis not present

## 2017-04-30 DIAGNOSIS — S065X0D Traumatic subdural hemorrhage without loss of consciousness, subsequent encounter: Secondary | ICD-10-CM | POA: Diagnosis not present

## 2017-04-30 DIAGNOSIS — N183 Chronic kidney disease, stage 3 (moderate): Secondary | ICD-10-CM | POA: Diagnosis not present

## 2017-04-30 DIAGNOSIS — I129 Hypertensive chronic kidney disease with stage 1 through stage 4 chronic kidney disease, or unspecified chronic kidney disease: Secondary | ICD-10-CM | POA: Diagnosis not present

## 2017-05-04 ENCOUNTER — Other Ambulatory Visit: Payer: Self-pay | Admitting: *Deleted

## 2017-05-04 NOTE — Patient Outreach (Signed)
[facility]N) Care Management  05/04/2017  Juan MantleJimmy L Goodwin [birth year] [id number]    Transition of care  RN attempted outreach unsuccessful and left a HIPAA approved voice message requesting a call back. Will rescheduled another call next week for [facility] services.  Elliot CousinLisa Matthews, RN Care Management Coordinator [facility] Main Office [phone]

## 2017-05-09 ENCOUNTER — Other Ambulatory Visit: Payer: Self-pay | Admitting: *Deleted

## 2017-05-09 DIAGNOSIS — S065X0D Traumatic subdural hemorrhage without loss of consciousness, subsequent encounter: Secondary | ICD-10-CM | POA: Diagnosis not present

## 2017-05-09 DIAGNOSIS — R296 Repeated falls: Secondary | ICD-10-CM | POA: Diagnosis not present

## 2017-05-09 DIAGNOSIS — R4701 Aphasia: Secondary | ICD-10-CM | POA: Diagnosis not present

## 2017-05-09 DIAGNOSIS — D631 Anemia in chronic kidney disease: Secondary | ICD-10-CM | POA: Diagnosis not present

## 2017-05-09 DIAGNOSIS — Z9181 History of falling: Secondary | ICD-10-CM | POA: Diagnosis not present

## 2017-05-09 DIAGNOSIS — N183 Chronic kidney disease, stage 3 (moderate): Secondary | ICD-10-CM | POA: Diagnosis not present

## 2017-05-09 DIAGNOSIS — I129 Hypertensive chronic kidney disease with stage 1 through stage 4 chronic kidney disease, or unspecified chronic kidney disease: Secondary | ICD-10-CM | POA: Diagnosis not present

## 2017-05-09 NOTE — Patient Outreach (Signed)
[facility]N) Care Management  05/09/2017  Juan MantleJimmy L Goodwin 05-07-1935 [id number]    [facility]N) Care Management  05/09/2017  Juan MantleJimmy L Goodwin 05-07-1935 [id number]    Transition of care  RN spoke with pt's consented daughter [name] who provided an update on pt's progress. Daughter states pt doing "very well and has improved a lot". States [facility] PT/Speech has discharged pt today and his BP is good now that they have added a new medication (Amlodipine). Pt will follow up with his primary provided on 2/28 and has visited the neurologist with the next follow up in 2 months for a repeat CAT scan. States pt is getting his strength back but not able to drive for a few more weeks. RN continue to offer further engagement however daughter remains receptive and appreciative for the ongoing transition of care calls. Care of plan discussed with adjustments to the interventions according to pt's progress. Offered any needed resources to continue to assist this pt however none needed at this time. Will scheduled another follow up next week for transition of care and follow up one after that consult.   Elliot CousinLisa Trinty Marken, RN Care Management Coordinator [facility] Main Office [phone]

## 2017-05-10 DIAGNOSIS — G479 Sleep disorder, unspecified: Secondary | ICD-10-CM | POA: Diagnosis not present

## 2017-05-10 DIAGNOSIS — I10 Essential (primary) hypertension: Secondary | ICD-10-CM | POA: Diagnosis not present

## 2017-05-14 ENCOUNTER — Other Ambulatory Visit: Payer: Self-pay | Admitting: *Deleted

## 2017-05-14 NOTE — Patient Outreach (Signed)
[facility]) Care Management  05/14/2017  Juan Goodwin 1936/01/11 [id number]    Transition of care  Unsuccessful outreach attempt to reach pt however able to leave a HIPAA approved voice message. Will follow up once again tomorrow for [facility].  Elliot CousinLisa Jarryn Altland, RN Care Management Coordinator [facility] Main Office [phone]

## 2017-05-15 ENCOUNTER — Other Ambulatory Visit: Payer: Self-pay | Admitting: *Deleted

## 2017-05-15 NOTE — Patient Outreach (Signed)
[name]) Care Management  05/15/2017  Juan Goodwin Aug 02, 1935 [id number]    Transition of care  RN outreached to pt today and spoke with the consented daughter Juan Goodwin who provided an update on pt's progress. States pt is doing well attending all medical appointments, taking his medications and [facility] has discharged pt last week from services. RN reviewed the care plan with most goals met and adjusted the long term goal accordingly. Offered any community resources needed at this time (none needed). Will continue to encouraged adherence and follow up in one month on pt's progress prior to graduating pt from the [facility] program and services. Daughter expressed how grateful she was for the calls from [facility] case manager.   Raina Mina, RN Care Management Coordinator [place] Office [phone]

## 2017-06-15 ENCOUNTER — Encounter: Payer: Self-pay | Admitting: *Deleted

## 2017-06-15 ENCOUNTER — Other Ambulatory Visit: Payer: Self-pay | Admitting: *Deleted

## 2017-06-15 NOTE — Patient Outreach (Addendum)
[facility]) Care Management  06/15/2017  Juan Goodwin 09-19-1935 [id number]    RN spoke with pt's consented daughter Juan Goodwin who updated RN on pt's condition. States pt is back doing all his activities driving and going to church functions, ect.. Denies any issues as pt taking all his medications and attending all his medical appointments with no issues or acute events. Daughter very thankful for the follow up calls and inquired over the last several months. States this was very helpful. All goals discussed via plan of care and met based upon pt's progress. Will continue to encouraged adherence with managing his care. No additional needs at this time as this case will be closed.  Raina Mina, RN Care Management Coordinator [place] Office [phone]

## 2017-07-09 DIAGNOSIS — I129 Hypertensive chronic kidney disease with stage 1 through stage 4 chronic kidney disease, or unspecified chronic kidney disease: Secondary | ICD-10-CM | POA: Diagnosis not present

## 2017-07-09 DIAGNOSIS — E785 Hyperlipidemia, unspecified: Secondary | ICD-10-CM | POA: Diagnosis not present

## 2017-07-09 DIAGNOSIS — S065X9A Traumatic subdural hemorrhage with loss of consciousness of unspecified duration, initial encounter: Secondary | ICD-10-CM | POA: Diagnosis not present

## 2017-07-09 DIAGNOSIS — N183 Chronic kidney disease, stage 3 (moderate): Secondary | ICD-10-CM | POA: Diagnosis not present

## 2017-07-09 DIAGNOSIS — N2581 Secondary hyperparathyroidism of renal origin: Secondary | ICD-10-CM | POA: Diagnosis not present

## 2017-07-09 DIAGNOSIS — N179 Acute kidney failure, unspecified: Secondary | ICD-10-CM | POA: Diagnosis not present

## 2017-07-09 DIAGNOSIS — D509 Iron deficiency anemia, unspecified: Secondary | ICD-10-CM | POA: Diagnosis not present

## 2017-07-09 DIAGNOSIS — E1122 Type 2 diabetes mellitus with diabetic chronic kidney disease: Secondary | ICD-10-CM | POA: Diagnosis not present

## 2017-07-11 ENCOUNTER — Ambulatory Visit
Admission: RE | Admit: 2017-07-11 | Discharge: 2017-07-11 | Disposition: A | Discharge status: Home / Self Care | Payer: Medicare HMO | Source: Ambulatory Visit | Attending: Neurosurgery | Admitting: Neurosurgery

## 2017-07-11 DIAGNOSIS — S065X9A Traumatic subdural hemorrhage with loss of consciousness of unspecified duration, initial encounter: Secondary | ICD-10-CM

## 2017-07-11 DIAGNOSIS — S065XAA Traumatic subdural hemorrhage with loss of consciousness status unknown, initial encounter: Secondary | ICD-10-CM

## 2017-07-11 DIAGNOSIS — I6200 Nontraumatic subdural hemorrhage, unspecified: Secondary | ICD-10-CM | POA: Diagnosis not present

## 2017-07-23 DIAGNOSIS — I129 Hypertensive chronic kidney disease with stage 1 through stage 4 chronic kidney disease, or unspecified chronic kidney disease: Secondary | ICD-10-CM | POA: Diagnosis not present

## 2017-11-21 DIAGNOSIS — D509 Iron deficiency anemia, unspecified: Secondary | ICD-10-CM | POA: Diagnosis not present

## 2017-11-21 DIAGNOSIS — N179 Acute kidney failure, unspecified: Secondary | ICD-10-CM | POA: Diagnosis not present

## 2017-11-21 DIAGNOSIS — N183 Chronic kidney disease, stage 3 (moderate): Secondary | ICD-10-CM | POA: Diagnosis not present

## 2017-11-21 DIAGNOSIS — E1122 Type 2 diabetes mellitus with diabetic chronic kidney disease: Secondary | ICD-10-CM | POA: Diagnosis not present

## 2017-11-21 DIAGNOSIS — N2581 Secondary hyperparathyroidism of renal origin: Secondary | ICD-10-CM | POA: Diagnosis not present

## 2017-11-21 DIAGNOSIS — I129 Hypertensive chronic kidney disease with stage 1 through stage 4 chronic kidney disease, or unspecified chronic kidney disease: Secondary | ICD-10-CM | POA: Diagnosis not present

## 2017-12-18 DIAGNOSIS — G2581 Restless legs syndrome: Secondary | ICD-10-CM | POA: Diagnosis not present

## 2017-12-18 DIAGNOSIS — I10 Essential (primary) hypertension: Secondary | ICD-10-CM | POA: Diagnosis not present

## 2017-12-18 DIAGNOSIS — N183 Chronic kidney disease, stage 3 (moderate): Secondary | ICD-10-CM | POA: Diagnosis not present

## 2017-12-18 DIAGNOSIS — E785 Hyperlipidemia, unspecified: Secondary | ICD-10-CM | POA: Diagnosis not present

## 2017-12-18 DIAGNOSIS — E1122 Type 2 diabetes mellitus with diabetic chronic kidney disease: Secondary | ICD-10-CM | POA: Diagnosis not present

## 2017-12-18 DIAGNOSIS — M79674 Pain in right toe(s): Secondary | ICD-10-CM | POA: Diagnosis not present

## 2017-12-29 ENCOUNTER — Other Ambulatory Visit: Payer: Self-pay

## 2017-12-29 ENCOUNTER — Emergency Department (HOSPITAL_COMMUNITY)
Admission: EM | Admit: 2017-12-29 | Discharge: 2017-12-29 | Disposition: A | Discharge status: Home / Self Care | Payer: Medicare HMO | Attending: Emergency Medicine | Admitting: Emergency Medicine

## 2017-12-29 ENCOUNTER — Emergency Department (HOSPITAL_COMMUNITY): Payer: Medicare HMO

## 2017-12-29 DIAGNOSIS — N183 Chronic kidney disease, stage 3 (moderate): Secondary | ICD-10-CM | POA: Insufficient documentation

## 2017-12-29 DIAGNOSIS — I10 Essential (primary) hypertension: Secondary | ICD-10-CM | POA: Diagnosis not present

## 2017-12-29 DIAGNOSIS — Z79899 Other long term (current) drug therapy: Secondary | ICD-10-CM | POA: Insufficient documentation

## 2017-12-29 DIAGNOSIS — R4020 Unspecified coma: Secondary | ICD-10-CM | POA: Diagnosis not present

## 2017-12-29 DIAGNOSIS — I129 Hypertensive chronic kidney disease with stage 1 through stage 4 chronic kidney disease, or unspecified chronic kidney disease: Secondary | ICD-10-CM | POA: Insufficient documentation

## 2017-12-29 DIAGNOSIS — R42 Dizziness and giddiness: Secondary | ICD-10-CM | POA: Diagnosis not present

## 2017-12-29 DIAGNOSIS — W19XXXA Unspecified fall, initial encounter: Secondary | ICD-10-CM | POA: Diagnosis not present

## 2017-12-29 DIAGNOSIS — R55 Syncope and collapse: Secondary | ICD-10-CM | POA: Diagnosis not present

## 2017-12-29 DIAGNOSIS — R0902 Hypoxemia: Secondary | ICD-10-CM | POA: Diagnosis not present

## 2017-12-29 LAB — CBC WITH DIFFERENTIAL/PLATELET
Abs Immature Granulocytes: 0.01 10*3/uL (ref 0.00–0.07)
Basophils Absolute: 0 10*3/uL (ref 0.0–0.1)
Basophils Relative: 1 %
Eosinophils Absolute: 0.2 10*3/uL (ref 0.0–0.5)
Eosinophils Relative: 4 %
HCT: 41.5 % (ref 39.0–52.0)
Hemoglobin: 12.4 g/dL — ABNORMAL LOW (ref 13.0–17.0)
Immature Granulocytes: 0 %
Lymphocytes Relative: 26 %
Lymphs Abs: 1.2 10*3/uL (ref 0.7–4.0)
MCH: 24.8 pg — ABNORMAL LOW (ref 26.0–34.0)
MCHC: 29.9 g/dL — ABNORMAL LOW (ref 30.0–36.0)
MCV: 82.8 fL (ref 80.0–100.0)
Monocytes Absolute: 0.3 10*3/uL (ref 0.1–1.0)
Monocytes Relative: 7 %
Neutro Abs: 2.9 10*3/uL (ref 1.7–7.7)
Neutrophils Relative %: 62 %
Platelets: 205 10*3/uL (ref 150–400)
RBC: 5.01 MIL/uL (ref 4.22–5.81)
RDW: 14.8 % (ref 11.5–15.5)
WBC: 4.7 10*3/uL (ref 4.0–10.5)
nRBC: 0 % (ref 0.0–0.2)

## 2017-12-29 LAB — BASIC METABOLIC PANEL
Anion gap: 4 — ABNORMAL LOW (ref 5–15)
BUN: 15 mg/dL (ref 8–23)
CO2: 26 mmol/L (ref 22–32)
Calcium: 9.5 mg/dL (ref 8.9–10.3)
Chloride: 111 mmol/L (ref 98–111)
Creatinine, Ser: 1.62 mg/dL — ABNORMAL HIGH (ref 0.61–1.24)
GFR calc Af Amer: 45 mL/min — ABNORMAL LOW (ref >60)
GFR calc non Af Amer: 38 mL/min — ABNORMAL LOW (ref >60)
Glucose, Bld: 135 mg/dL — ABNORMAL HIGH (ref 70–99)
Potassium: 4.1 mmol/L (ref 3.5–5.1)
Sodium: 141 mmol/L (ref 135–145)

## 2017-12-29 LAB — I-STAT TROPONIN, ED: Troponin i, poc: 0.01 ng/mL (ref 0.00–0.08)

## 2017-12-29 MED ORDER — SODIUM CHLORIDE 0.9 % IV BOLUS
1000.0000 mL | Freq: Once | INTRAVENOUS | Status: AC
  Administered 2017-12-29: 1000 mL via INTRAVENOUS

## 2017-12-29 NOTE — ED Provider Notes (Signed)
[facility] EMERGENCY DEPARTMENT Provider Note   CSN: [id number] Arrival date & time: 12/29/17  1448     History   Chief Complaint No chief complaint on file.   HPI Juan Goodwin is a 82 y.o. male.  He presents by ambulance after having a syncopal event while standing in line at [employer] today.  He had an initial prodrome of feeling dizzy lightheaded and then he remembers waking up on the ground with a ready standing around him.  He thinks somebody had noticed him starting to sway lowered him down so he did have an injury.  Currently he denies any complaints.  No headache no numbness no weakness no chest pain or shortness of breath no blurry vision.  He recently was at his PCP where they took away 1 of his blood pressures and raised another one because he is been running high.  Use was to go for follow-up appointment this coming week.  The history is provided by the patient.  Loss of Consciousness   This is a new problem. The current episode started less than 1 hour ago. The problem has been resolved. He lost consciousness for a period of less than one minute. The problem is associated with normal activity. Pertinent negatives include abdominal pain, back pain, chest pain, confusion, congestion, fever, nausea, palpitations, seizures, slurred speech and vomiting. He has tried nothing for the symptoms. The treatment provided significant relief. His past medical history is significant for HTN.    Past Medical History:  Diagnosis Date  . Hypertension   . Renal insufficiency     Patient Active Problem List   Diagnosis Date Noted  . Weakness 04/10/2017  . Dehydration 04/10/2017  . Acute kidney injury superimposed on chronic kidney disease (HCC) 04/10/2017  . Normocytic normochromic anemia 04/10/2017  . Chronic kidney disease (CKD), stage III (moderate) (HCC) 03/30/2017  . Hypertension 03/29/2017  . Orthostatic hypotension 03/27/2017  . Global aphasia 03/27/2017    . Subdural hematoma (HCC) 03/26/2017  . Acute pyloric channel ulcer 07/13/2014  . Hypokalemia 07/13/2014  . Acute upper GI bleed 07/13/2014  . ARF (acute renal failure) (HCC) 07/13/2014  . Hypernatremia 07/13/2014  . Hypotension 07/13/2014  . Thrombocytopenia (HCC) 07/13/2014  . Helicobacter pylori gastritis 07/13/2014  . Gastrointestinal hemorrhage with melena   . Melena 07/11/2014  . GI bleed 07/11/2014  . Acute blood loss anemia 07/10/2014    Past Surgical History:  Procedure Laterality Date  . CRANIOTOMY Left 03/26/2017   Procedure: CRANIOTOMY HEMATOMA EVACUATION SUBDURAL;  Surgeon: Donalee Citrin, MD;  Location: [facility] OR;  Service: Neurosurgery;  Laterality: Left;  . ESOPHAGOGASTRODUODENOSCOPY N/A 07/11/2014   Procedure: ESOPHAGOGASTRODUODENOSCOPY (EGD);  Surgeon: Charlott Rakes, MD;  Location: [facility];  Service: Endoscopy;  Laterality: N/A;        Home Medications    Prior to Admission medications   Medication Sig Start Date End Date Taking? Authorizing Provider  amLODipine (NORVASC) 5 MG tablet Take 5 mg by mouth daily.    [provider]  loratadine (CLARITIN) 10 MG tablet Take 10 mg by mouth daily as needed for allergies.     [provider]  methylPREDNISolone (MEDROL DOSEPAK) 4 MG TBPK tablet Take 4 mg by mouth as directed. x6 days. 04/06/17   [provider]  paricalcitol (ZEMPLAR) 1 MCG capsule Take 1 mcg by mouth daily. 05/26/14   [provider]    Family History No family history on file.  Social History Social History  Tobacco Use  . Smoking status: Never Smoker  . Smokeless tobacco: Never Used  Substance Use Topics  . Alcohol use: No  . Drug use: Not on file     Allergies   Patient has no known allergies.   Review of Systems Review of Systems  Constitutional: Negative for fever.  HENT: Negative for congestion and sore throat.   Eyes: Negative for visual disturbance.  Respiratory: Negative for shortness of  breath.   Cardiovascular: Positive for syncope. Negative for chest pain and palpitations.  Gastrointestinal: Negative for abdominal pain, nausea and vomiting.  Genitourinary: Negative for dysuria.  Musculoskeletal: Negative for back pain and neck pain.  Skin: Negative for rash.  Neurological: Positive for syncope. Negative for seizures.  Psychiatric/Behavioral: Negative for confusion.     Physical Exam Updated Vital Signs BP (!) 182/119   Pulse 62   Temp 98.4 F (36.9 C) (Oral)   Resp 14   Ht 6' 1.75" (1.873 m)   Wt 86.2 kg   SpO2 100%   BMI 24.56 kg/m   Physical Exam  Constitutional: He is oriented to person, place, and time. He appears well-developed and well-nourished.  HENT:  Head: Normocephalic and atraumatic.  Old healed Craney scar left fronotemporal  Eyes: Conjunctivae are normal.  Neck: Neck supple.  Cardiovascular: Normal rate, regular rhythm and normal heart sounds.  No murmur heard. Pulmonary/Chest: Effort normal and breath sounds normal. No respiratory distress.  Abdominal: Soft. There is no tenderness.  Musculoskeletal: He exhibits no edema, tenderness or deformity.  Neurological: He is alert and oriented to person, place, and time. He has normal strength. No sensory deficit. GCS eye subscore is 4. GCS verbal subscore is 5. GCS motor subscore is 6.  Skin: Skin is warm and dry.  Psychiatric: He has a normal mood and affect.  Nursing note and vitals reviewed.    ED Treatments / Results  Labs (all labs ordered are listed, but only abnormal results are displayed) Labs Reviewed  BASIC METABOLIC PANEL - Abnormal; Notable for the following components:      Result Value   Glucose, Bld 135 (*)    Creatinine, Ser 1.62 (*)    GFR calc non Af Amer 38 (*)    GFR calc Af Amer 45 (*)    Anion gap 4 (*)    All other components within normal limits  CBC WITH DIFFERENTIAL/PLATELET - Abnormal; Notable for the following components:   Hemoglobin 12.4 (*)    MCH 24.8  (*)    MCHC 29.9 (*)    All other components within normal limits  I-STAT TROPONIN, ED    EKG EKG Interpretation  Date/Time:  Saturday December 29 2017 17:05:24 EDT Ventricular Rate:  74 PR Interval:    QRS Duration: 98 QT Interval:  399 QTC Calculation: 443 R Axis:   -66 Text Interpretation:  Sinus rhythm Consider left atrial enlargement Left anterior fascicular block Abnormal R-wave progression, late transition Borderline T abnormalities, inferior leads compared with prior today Confirmed by Meridee Score [phone]) on 12/29/2017 5:08:57 PM Also confirmed by Meridee Score [phone]), editor Barbette Hair [phone])  on 12/30/2017 9:45:16 AM   Radiology Ct Head Wo Contrast  Result Date: 12/29/2017 CLINICAL DATA:  Syncope.  Altered level of consciousness. EXAM: CT HEAD WITHOUT CONTRAST TECHNIQUE: Contiguous axial images were obtained from the base of the skull through the vertex without intravenous contrast. COMPARISON:  CT scan of Jul 11, 2017. FINDINGS: Brain: Left frontal extra-axial fluid collection noted on prior exam  has resolved. No mass effect or midline shift is noted. Ventricular size is within normal limits. There is no evidence of mass lesion, hemorrhage or acute infarction. Vascular: No hyperdense vessel or unexpected calcification. Skull: Status post left frontal craniotomy. No acute osseous abnormality is noted. Sinuses/Orbits: No acute finding. Other: None. IMPRESSION: No acute intracranial abnormality seen. Electronically Signed   By: Lupita Raider, M.D.   On: 12/29/2017 16:25    Procedures Procedures (including critical care time)  Medications Ordered in ED Medications - No data to display   Initial Impression / Assessment and Plan / ED Course  I have reviewed the triage vital signs and the nursing notes.  Pertinent labs & imaging results that were available during my care of the patient were reviewed by me and considered in my medical decision making (see chart for  details).  Clinical Course as of Dec 30 1200  Sat Dec 29, 2017  6571 82 year old male with recent blood pressure maladjustment here after a syncopal event while standing in line.  He denies any injury and feels back to baseline.  He said he had a prior syncopal events after which she had to undergo brain surgery for a subdural.  He is getting some screening labs EKG and a head CT.  Likely discharge if testing is unremarkable.   [MB]  1709 Repeated a second EKG the patient just got some nonspecific inferior T wave abnormalities.  We did do orthostatics and he was fine from laying to sitting but he did have an increased heart rate did drop in systolic blood pressure on standing.  Ordered some IV fluids and will also check a troponin.  His creatinine is about baseline for him with some known chronic renal insufficiency.   [MB]    Clinical Course User Index [MB] Terrilee Files, MD      Final Clinical Impressions(s) / ED Diagnoses   Final diagnoses:  Syncope, unspecified syncope type    ED Discharge Orders    None       Terrilee Files, MD 12/30/17 1202

## 2017-12-29 NOTE — ED Triage Notes (Signed)
Per EMS: Pt standing in line at [employer] and had a witnessed syncopal episode.  Pt states he felt dizzy then collapsed.  Witnesses assisted pt to ground.  No trauma noted per EMS assessment.  Pt states he recently had an adjustment to BP medications.  Pt lives home alone and is very active.

## 2017-12-29 NOTE — Discharge Instructions (Addendum)
You were seen in the emergency department for a fainting spell also called syncope.  You had blood work EKG and a CAT scan of your head that did not show any obvious abnormalities.  This possibly is related to your change in blood pressure medicines.  Please call your primary care doctor to her them know about this episode and follow-up with them.  Return if any concerns.

## 2018-01-01 DIAGNOSIS — I10 Essential (primary) hypertension: Secondary | ICD-10-CM | POA: Diagnosis not present

## 2018-01-01 DIAGNOSIS — H9190 Unspecified hearing loss, unspecified ear: Secondary | ICD-10-CM | POA: Diagnosis not present

## 2018-01-01 DIAGNOSIS — H6123 Impacted cerumen, bilateral: Secondary | ICD-10-CM | POA: Diagnosis not present

## 2018-01-01 DIAGNOSIS — H6693 Otitis media, unspecified, bilateral: Secondary | ICD-10-CM | POA: Diagnosis not present

## 2018-01-01 DIAGNOSIS — R55 Syncope and collapse: Secondary | ICD-10-CM | POA: Diagnosis not present

## 2018-01-01 DIAGNOSIS — D509 Iron deficiency anemia, unspecified: Secondary | ICD-10-CM | POA: Diagnosis not present

## 2018-01-01 DIAGNOSIS — E785 Hyperlipidemia, unspecified: Secondary | ICD-10-CM | POA: Diagnosis not present

## 2018-05-22 DIAGNOSIS — D631 Anemia in chronic kidney disease: Secondary | ICD-10-CM | POA: Diagnosis not present

## 2018-05-22 DIAGNOSIS — E1122 Type 2 diabetes mellitus with diabetic chronic kidney disease: Secondary | ICD-10-CM | POA: Diagnosis not present

## 2018-05-22 DIAGNOSIS — I129 Hypertensive chronic kidney disease with stage 1 through stage 4 chronic kidney disease, or unspecified chronic kidney disease: Secondary | ICD-10-CM | POA: Diagnosis not present

## 2018-05-22 DIAGNOSIS — S065X9A Traumatic subdural hemorrhage with loss of consciousness of unspecified duration, initial encounter: Secondary | ICD-10-CM | POA: Diagnosis not present

## 2018-05-22 DIAGNOSIS — N183 Chronic kidney disease, stage 3 (moderate): Secondary | ICD-10-CM | POA: Diagnosis not present

## 2018-05-22 DIAGNOSIS — N2581 Secondary hyperparathyroidism of renal origin: Secondary | ICD-10-CM | POA: Diagnosis not present

## 2018-05-22 DIAGNOSIS — N189 Chronic kidney disease, unspecified: Secondary | ICD-10-CM | POA: Diagnosis not present

## 2018-06-08 ENCOUNTER — Emergency Department (HOSPITAL_COMMUNITY): Payer: Medicare HMO

## 2018-06-08 ENCOUNTER — Emergency Department (HOSPITAL_COMMUNITY)
Admission: EM | Admit: 2018-06-08 | Discharge: 2018-06-08 | Disposition: A | Discharge status: Home / Self Care | Payer: Medicare HMO | Attending: Emergency Medicine | Admitting: Emergency Medicine

## 2018-06-08 ENCOUNTER — Other Ambulatory Visit: Payer: Self-pay

## 2018-06-08 ENCOUNTER — Encounter (HOSPITAL_COMMUNITY): Payer: Self-pay

## 2018-06-08 DIAGNOSIS — N183 Chronic kidney disease, stage 3 (moderate): Secondary | ICD-10-CM | POA: Diagnosis not present

## 2018-06-08 DIAGNOSIS — I129 Hypertensive chronic kidney disease with stage 1 through stage 4 chronic kidney disease, or unspecified chronic kidney disease: Secondary | ICD-10-CM | POA: Insufficient documentation

## 2018-06-08 DIAGNOSIS — R42 Dizziness and giddiness: Secondary | ICD-10-CM | POA: Insufficient documentation

## 2018-06-08 DIAGNOSIS — Z87891 Personal history of nicotine dependence: Secondary | ICD-10-CM | POA: Insufficient documentation

## 2018-06-08 DIAGNOSIS — Z79899 Other long term (current) drug therapy: Secondary | ICD-10-CM | POA: Diagnosis not present

## 2018-06-08 DIAGNOSIS — R000 Tachycardia, unspecified: Secondary | ICD-10-CM | POA: Diagnosis not present

## 2018-06-08 DIAGNOSIS — I6782 Cerebral ischemia: Secondary | ICD-10-CM | POA: Diagnosis not present

## 2018-06-08 LAB — CBC WITH DIFFERENTIAL/PLATELET
Abs Immature Granulocytes: 0 10*3/uL (ref 0.00–0.07)
Basophils Absolute: 0 10*3/uL (ref 0.0–0.1)
Basophils Relative: 1 %
Eosinophils Absolute: 0.1 10*3/uL (ref 0.0–0.5)
Eosinophils Relative: 1 %
HCT: 41.6 % (ref 39.0–52.0)
Hemoglobin: 13 g/dL (ref 13.0–17.0)
Immature Granulocytes: 0 %
Lymphocytes Relative: 24 %
Lymphs Abs: 1.3 10*3/uL (ref 0.7–4.0)
MCH: 25.1 pg — ABNORMAL LOW (ref 26.0–34.0)
MCHC: 31.3 g/dL (ref 30.0–36.0)
MCV: 80.5 fL (ref 80.0–100.0)
Monocytes Absolute: 0.4 10*3/uL (ref 0.1–1.0)
Monocytes Relative: 8 %
Neutro Abs: 3.4 10*3/uL (ref 1.7–7.7)
Neutrophils Relative %: 66 %
Platelets: 198 10*3/uL (ref 150–400)
RBC: 5.17 MIL/uL (ref 4.22–5.81)
RDW: 14.8 % (ref 11.5–15.5)
WBC: 5.2 10*3/uL (ref 4.0–10.5)
nRBC: 0 % (ref 0.0–0.2)

## 2018-06-08 LAB — BASIC METABOLIC PANEL
Anion gap: 12 (ref 5–15)
BUN: 19 mg/dL (ref 8–23)
CO2: 22 mmol/L (ref 22–32)
Calcium: 9.7 mg/dL (ref 8.9–10.3)
Chloride: 102 mmol/L (ref 98–111)
Creatinine, Ser: 1.61 mg/dL — ABNORMAL HIGH (ref 0.61–1.24)
GFR calc Af Amer: 46 mL/min — ABNORMAL LOW (ref >60)
GFR calc non Af Amer: 40 mL/min — ABNORMAL LOW (ref >60)
Glucose, Bld: 154 mg/dL — ABNORMAL HIGH (ref 70–99)
Potassium: 3.9 mmol/L (ref 3.5–5.1)
Sodium: 136 mmol/L (ref 135–145)

## 2018-06-08 LAB — CBG MONITORING, ED: Glucose-Capillary: 114 mg/dL — ABNORMAL HIGH (ref 70–99)

## 2018-06-08 MED ORDER — MECLIZINE HCL 25 MG PO TABS
25.0000 mg | ORAL_TABLET | Freq: Once | ORAL | Status: AC
  Administered 2018-06-08: 25 mg via ORAL
  Filled 2018-06-08: qty 1

## 2018-06-08 MED ORDER — CARVEDILOL 3.125 MG PO TABS: 3.1250 mg | ORAL_TABLET | Freq: Two times a day (BID) | ORAL | 0 refills | Status: DC

## 2018-06-08 MED ORDER — AMLODIPINE BESYLATE 5 MG PO TABS: 5.0000 mg | ORAL_TABLET | Freq: Every day | ORAL | 0 refills | Status: DC

## 2018-06-08 NOTE — ED Provider Notes (Signed)
[facility] EMERGENCY DEPARTMENT Provider Note   CSN: [id number] Arrival date & time: 06/08/18  1320    History   Chief Complaint Chief Complaint  Patient presents with  . Dizziness    HPI Juan Goodwin is a 83 y.o. male with a past medical history of HTN, CKD prior SDH, prior GI bleed, who presents to ED for dizziness, near syncope for the past 2 weeks but has worsened in the past 24 hours.  States that his PCP nearly doubled dose of his Coreg and is now taking 6.25 mg.  He is unsure if this is what is causing his symptoms.  States that last night he woke up, tried to walk to the bathroom in the middle the night and then felt like he was going to pass out.  He did not hit the floor or have any head injuries, as he was able to catch himself from falling.  He is at his baseline when he lays down but then has dizziness when he tries to sit up or walk.  Feels like the room is spinning around him.  He denies any headache, vision changes, chest pain, shortness of breath, vomiting, diarrhea, numbness in arms or legs, extremity weakness.     HPI  Past Medical History:  Diagnosis Date  . Hypertension   . Renal insufficiency     Patient Active Problem List   Diagnosis Date Noted  . Weakness 04/10/2017  . Dehydration 04/10/2017  . Acute kidney injury superimposed on chronic kidney disease (HCC) 04/10/2017  . Normocytic normochromic anemia 04/10/2017  . Chronic kidney disease (CKD), stage III (moderate) (HCC) 03/30/2017  . Hypertension 03/29/2017  . Orthostatic hypotension 03/27/2017  . Global aphasia 03/27/2017  . Subdural hematoma (HCC) 03/26/2017  . Acute pyloric channel ulcer 07/13/2014  . Hypokalemia 07/13/2014  . Acute upper GI bleed 07/13/2014  . ARF (acute renal failure) (HCC) 07/13/2014  . Hypernatremia 07/13/2014  . Hypotension 07/13/2014  . Thrombocytopenia (HCC) 07/13/2014  . Helicobacter pylori gastritis 07/13/2014  . Gastrointestinal hemorrhage  with melena   . Melena 07/11/2014  . GI bleed 07/11/2014  . Acute blood loss anemia 07/10/2014    Past Surgical History:  Procedure Laterality Date  . BRAIN SURGERY    . CRANIOTOMY Left 03/26/2017   Procedure: CRANIOTOMY HEMATOMA EVACUATION SUBDURAL;  Surgeon: Donalee Citrin, MD;  Location: [facility] OR;  Service: Neurosurgery;  Laterality: Left;  . ESOPHAGOGASTRODUODENOSCOPY N/A 07/11/2014   Procedure: ESOPHAGOGASTRODUODENOSCOPY (EGD);  Surgeon: Charlott Rakes, MD;  Location: [facility];  Service: Endoscopy;  Laterality: N/A;        Home Medications    Prior to Admission medications   Medication Sig Start Date End Date Taking? Authorizing Provider  amLODipine (NORVASC) 5 MG tablet Take 1 tablet (5 mg total) by mouth daily. 06/08/18   Wileen Duncanson, PA-C  carvedilol (COREG) 3.125 MG tablet Take 1 tablet (3.125 mg total) by mouth 2 (two) times daily with a meal. 06/08/18   Vianca Bracher, PA-C  naproxen sodium (ALEVE) 220 MG tablet Take 220 mg by mouth daily as needed (pain).    [provider]  paricalcitol (ZEMPLAR) 1 MCG capsule Take 1 mcg by mouth daily. 05/26/14   [provider]    Family History History reviewed. No pertinent family history.  Social History Social History   Tobacco Use  . Smoking status: Former Smoker    Packs/day: 1.00    Years: 35.00    Pack years: 35.00  . Smokeless  tobacco: Never Used  Substance Use Topics  . Alcohol use: No  . Drug use: Not Currently     Allergies   Patient has no known allergies.   Review of Systems Review of Systems  Constitutional: Negative for appetite change, chills and fever.  HENT: Negative for ear pain, rhinorrhea, sneezing and sore throat.   Eyes: Negative for photophobia and visual disturbance.  Respiratory: Negative for cough, chest tightness, shortness of breath and wheezing.   Cardiovascular: Negative for chest pain and palpitations.  Gastrointestinal: Negative for abdominal pain, blood in stool,  constipation, diarrhea, nausea and vomiting.  Genitourinary: Negative for dysuria, hematuria and urgency.  Musculoskeletal: Negative for myalgias.  Skin: Negative for rash.  Neurological: Positive for dizziness. Negative for weakness and light-headedness.     Physical Exam Updated Vital Signs BP (!) 165/99   Pulse 96   Temp 98.1 F (36.7 C) (Oral)   Resp 19   Ht 6\' 1"  (1.854 m)   Wt 87.1 kg   SpO2 99%   BMI 25.33 kg/m   Physical Exam Vitals signs and nursing note reviewed.  Constitutional:      General: He is not in acute distress.    Appearance: He is well-developed.  HENT:     Head: Normocephalic and atraumatic.     Nose: Nose normal.  Eyes:     General: No scleral icterus.       Right eye: No discharge.        Left eye: No discharge.     Conjunctiva/sclera: Conjunctivae normal.     Pupils: Pupils are equal, round, and reactive to light.  Neck:     Musculoskeletal: Normal range of motion and neck supple.  Cardiovascular:     Rate and Rhythm: Normal rate and regular rhythm.     Heart sounds: Normal heart sounds. No murmur. No friction rub. No gallop.   Pulmonary:     Effort: Pulmonary effort is normal. No respiratory distress.     Breath sounds: Normal breath sounds.  Abdominal:     General: Bowel sounds are normal. There is no distension.     Palpations: Abdomen is soft.     Tenderness: There is no abdominal tenderness. There is no guarding.  Musculoskeletal: Normal range of motion.  Skin:    General: Skin is warm and dry.     Findings: No rash.  Neurological:     General: No focal deficit present.     Mental Status: He is alert and oriented to person, place, and time.     Cranial Nerves: No cranial nerve deficit.     Sensory: No sensory deficit.     Motor: No weakness or abnormal muscle tone.     Coordination: Coordination normal.     Comments: Alert, oriented to self, place, situation. Pupils reactive. No facial asymmetry noted. Cranial nerves appear  grossly intact. Sensation intact to light touch on face, BUE and BLE. Strength 5/5 in BUE and BLE.       ED Treatments / Results  Labs (all labs ordered are listed, but only abnormal results are displayed) Labs Reviewed  BASIC METABOLIC PANEL - Abnormal; Notable for the following components:      Result Value   Glucose, Bld 154 (*)    Creatinine, Ser 1.61 (*)    GFR calc non Af Amer 40 (*)    GFR calc Af Amer 46 (*)    All other components within normal limits  CBC WITH DIFFERENTIAL/PLATELET - Abnormal;  Notable for the following components:   MCH 25.1 (*)    All other components within normal limits  CBG MONITORING, ED - Abnormal; Notable for the following components:   Glucose-Capillary 114 (*)    All other components within normal limits    EKG EKG Interpretation  Date/Time:  Saturday June 08 2018 13:29:19 EDT Ventricular Rate:  107 PR Interval:    QRS Duration: 113 QT Interval:  353 QTC Calculation: 471 R Axis:   -79 Text Interpretation:  Sinus tachycardia Left anterior fascicular block Abnormal R-wave progression, late transition Nonspecific ST depression Baseline wander in lead(s) V2 Since prior ECG< rate is now faster Confirmed by Alvira MondaySchlossman, Erin ([id number]) on 06/08/2018 1:40:02 PM   Radiology No results found.  Procedures Procedures (including critical care time)  Medications Ordered in ED Medications  meclizine (ANTIVERT) tablet 25 mg (has no administration in time range)     Initial Impression / Assessment and Plan / ED Course  I have reviewed the triage vital signs and the nursing notes.  Pertinent labs & imaging results that were available during my care of the patient were reviewed by me and considered in my medical decision making (see chart for details).        83 year old male with a past medical history of hypertension, CKD, prior SDH, prior GI bleed, presents to ED for near syncope for the past 2 weeks but worse in the past 24 hours.  He was  increased in his dose of Coreg from 3.125-6.25 by his PCP 2 weeks ago.  He is unsure if this is what is causing his symptoms.  He woke up last night, tried to walk to the bathroom and then felt like he was going to pass out.  He denies any head injury or loss of consciousness. He was able to catch himself from falling. Reports feeling like the room is spinning anytime he tries to sit or stand. He denies any headache, vision changes, vomiting, numbness in arms or legs, chest pain, shortness of breath. On my exam he is overall well appearing. No deficits to neurological exam noted. He is AAOx3. He has no weakness or changes to sensation noted.  BMP shows creatinine of 1.6 which is similar to baseline.  CBC unremarkable.  EKG shows tracing similar to prior.  Will obtain imaging of the head for further evaluation. Given meclizine for trial to help with dizziness. If imaging negative, will dispo home with Coreg 3.125mg  and add on amlodipine as well as PCP follow-up. Care handed off to PA Lawyer.    Portions of this note were generated with Scientist, clinical (histocompatibility and immunogenetics)Dragon dictation software. Dictation errors may occur despite best attempts at proofreading.   Final Clinical Impressions(s) / ED Diagnoses   Final diagnoses:  Dizziness    ED Discharge Orders         Ordered    carvedilol (COREG) 3.125 MG tablet  2 times daily with meals     06/08/18 1534    amLODipine (NORVASC) 5 MG tablet  Daily     06/08/18 1534           Dietrich PatesKhatri, Nameer Summer, PA-C 06/08/18 1539    Alvira MondaySchlossman, Erin, MD 06/09/18 [phone]

## 2018-06-08 NOTE — Discharge Instructions (Addendum)
Take the Coreg 3.125 mg and add on the amlodipine to help with blood pressure. Follow-up with your primary care provider. Return to the ED if you start to have worsening symptoms, blurry vision, chest pain, shortness of breath or head injuries.

## 2018-06-08 NOTE — ED Notes (Signed)
ED Provider at bedside. 

## 2018-06-08 NOTE — ED Triage Notes (Signed)
Pt arrives POV from c/o dizziness and reports passing out last night. Pt states he provider recently increased the dosage of Coreg from 3.125 mg to 6.25 mg. Pt states he normally checks his BP but can not recall what his BP was after taking BP medication; also states he did not take his BP med this morning.

## 2018-06-08 NOTE — ED Notes (Signed)
Patient verbalizes understanding of discharge instructions. Opportunity for questioning and answers were provided. Armband removed by staff, pt discharged from ED.  

## 2018-06-08 NOTE — ED Notes (Signed)
Patient transported to MRI 

## 2018-06-11 DIAGNOSIS — R2681 Unsteadiness on feet: Secondary | ICD-10-CM | POA: Diagnosis not present

## 2018-06-11 DIAGNOSIS — I679 Cerebrovascular disease, unspecified: Secondary | ICD-10-CM | POA: Diagnosis not present

## 2018-06-11 DIAGNOSIS — E1122 Type 2 diabetes mellitus with diabetic chronic kidney disease: Secondary | ICD-10-CM | POA: Diagnosis not present

## 2018-06-11 DIAGNOSIS — I10 Essential (primary) hypertension: Secondary | ICD-10-CM | POA: Diagnosis not present

## 2018-06-27 DIAGNOSIS — E1122 Type 2 diabetes mellitus with diabetic chronic kidney disease: Secondary | ICD-10-CM | POA: Diagnosis not present

## 2018-06-27 DIAGNOSIS — G2581 Restless legs syndrome: Secondary | ICD-10-CM | POA: Diagnosis not present

## 2018-08-22 ENCOUNTER — Telehealth: Payer: Self-pay

## 2018-08-22 NOTE — Telephone Encounter (Signed)
I contacted the pt and left a vm on his home # requesting he call back so we could reschedule his 08/23/18 appt. Our office hours have temporally changed to Monday-Thursday. Pt has been removed from 08/23/18 schedule.

## 2018-08-23 ENCOUNTER — Ambulatory Visit: Payer: Self-pay | Admitting: Neurology

## 2018-10-08 ENCOUNTER — Other Ambulatory Visit: Payer: Self-pay

## 2018-10-08 ENCOUNTER — Ambulatory Visit (INDEPENDENT_AMBULATORY_CARE_PROVIDER_SITE_OTHER): Payer: Medicare HMO | Admitting: Neurology

## 2018-10-08 ENCOUNTER — Encounter: Payer: Self-pay | Admitting: Neurology

## 2018-10-08 DIAGNOSIS — G4752 REM sleep behavior disorder: Secondary | ICD-10-CM | POA: Diagnosis not present

## 2018-10-08 DIAGNOSIS — I679 Cerebrovascular disease, unspecified: Secondary | ICD-10-CM | POA: Diagnosis not present

## 2018-10-08 HISTORY — DX: REM sleep behavior disorder: G47.52

## 2018-10-08 HISTORY — DX: Cerebrovascular disease, unspecified: I67.9

## 2018-10-08 NOTE — Patient Instructions (Signed)
Begin aspirin 81 mg a day.

## 2018-10-08 NOTE — Progress Notes (Signed)
Reason for visit: Cerebrovascular disease  Referring physician: Dr. Vinnie Langtonoss  Juan Goodwin is a 83 y.o. male  History of present illness:  Juan Goodwin is an 83 year old right-handed black male with a history of significant hypertension.  The patient in the past has had problems with orthostatic hypotension associated with dizziness or even blacking out with blood pressure medication adjustments.  The patient was in the emergency room on 08 June 2018 after his Coreg dose was doubled and was reporting significant dizziness when he would stand up.  The patient had MRI evaluation of the brain at that time that showed some small vessel disease, by my review appears to be very mild in nature.  The patient in the past has been on low-dose aspirin but apparently went off the aspirin after a fall a couple years ago when he sustained a left-sided subdural hematoma requiring surgical drainage.  The patient was never placed back on aspirin following that event.  He currently indicates that he feels quite well.  He comes into the office today with a very high blood pressure but apparently missed his medication dosing this morning.  The patient reports no numbness or weakness of extremities, he denies dizziness at this point, he denies problems with balance or any falls.  He has not had any further blackouts.  He has no focal numbness or weakness of the face, arms, or legs.  He reports no headaches or vision changes.  He does have a history of borderline diabetes, diet controlled.  The patient also reports that he frequently will act out his dreams, he sometimes will fall out of bed because of this.  Past Medical History:  Diagnosis Date  . High cholesterol   . Hypertension   . Renal insufficiency     Past Surgical History:  Procedure Laterality Date  . BRAIN SURGERY    . CRANIOTOMY Left 03/26/2017   Procedure: CRANIOTOMY HEMATOMA EVACUATION SUBDURAL;  Surgeon: Donalee Citrinram, Gary, MD;  [facility];  Service:  Neurosurgery;  Laterality: Left;  . ESOPHAGOGASTRODUODENOSCOPY N/A 07/11/2014   Procedure: ESOPHAGOGASTRODUODENOSCOPY (EGD);  Surgeon: Charlott RakesVincent Schooler, MD;  Location[facility];  Service: Endoscopy;  Laterality: N/A;    History reviewed. No pertinent family history.  Social history:  reports that he has quit smoking. He has a 35.00 pack-year smoking history. He has never used smokeless tobacco. He reports previous drug use. He reports that he does not drink alcohol.  Medications:  Prior to Admission medications   Medication Sig Start Date End Date Taking? Authorizing Provider  rosuvastatin (CRESTOR) 10 MG tablet Take 10 mg by mouth daily.   Yes [provider]  amLODipine (NORVASC) 5 MG tablet Take 1 tablet (5 mg total) by mouth daily. 06/08/18   Khatri, Hina, PA-C  carvedilol (COREG) 3.125 MG tablet Take 1 tablet (3.125 mg total) by mouth 2 (two) times daily with a meal. 06/08/18   Khatri, Hina, PA-C  naproxen sodium (ALEVE) 220 MG tablet Take 220 mg by mouth daily as needed (pain).    [provider]  paricalcitol (ZEMPLAR) 1 MCG capsule Take 1 mcg by mouth daily. 05/26/14   [provider]     No Known Allergies  ROS:  Out of a complete 14 system review of symptoms, the patient complains only of the following symptoms, and all other reviewed systems are negative.  Fatigue Hearing loss Itching Snoring Constipation Impotence Memory loss, weakness, passing out Too much sleep, decreased energy  Blood pressure Marland Kitchen(!)  184/116, pulse (!) 103, temperature (!) 97.2 F (36.2 C), temperature source Temporal, height 6\' 2"  (1.88 m), weight 187 lb (84.8 kg).  Physical Exam  General: The patient is alert and cooperative at the time of the examination.  Eyes: Pupils are equal, round, and reactive to light. Discs are flat bilaterally.  Neck: The neck is supple, no carotid bruits are noted.  Respiratory: The respiratory examination is clear.  Cardiovascular:  The cardiovascular examination reveals a regular rate and rhythm, no obvious murmurs or rubs are noted.  Skin: Extremities are without significant edema.  Neurologic Exam  Mental status: The patient is alert and oriented x 3 at the time of the examination. The patient has apparent normal recent and remote memory, with an apparently normal attention span and concentration ability.  Cranial nerves: Facial symmetry is present. There is good sensation of the face to pinprick and soft touch bilaterally. The strength of the facial muscles and the muscles to head turning and shoulder shrug are normal bilaterally. Speech is well enunciated, no aphasia or dysarthria is noted. Extraocular movements are full. Visual fields are full. The tongue is midline, and the patient has symmetric elevation of the soft palate. No obvious hearing deficits are noted.  The patient is hard of hearing.  Motor: The motor testing reveals 5 over 5 strength of all 4 extremities. Good symmetric motor tone is noted throughout.  Sensory: Sensory testing is intact to pinprick, soft touch, vibration sensation, and position sense on all 4 extremities. No evidence of extinction is noted.  Coordination: Cerebellar testing reveals good finger-nose-finger and heel-to-shin bilaterally.  Gait and station: Gait is normal. Tandem gait is normal. Romberg is negative. No drift is seen.  Reflexes: Deep tendon reflexes are symmetric and normal bilaterally, but his ankle jerk reflexes are depressed bilaterally. Toes are downgoing bilaterally.   MRI brain 06/08/18:  IMPRESSION: 1. No acute intracranial abnormality. 2. Chronic small vessel ischemic disease with chronic lacunar infarcts as above. 3. Evidence of remote subarachnoid hemorrhage with hemosiderin staining in the region of the left sylvian fissure.  * MRI scan images were reviewed online. I agree with the written report.    Assessment/Plan:  1.  History of dizziness,  resolved  2.  Mild cerebrovascular disease by MRI brain  3.  Severe hypertension  4.  REM sleep behavior disorder  The patient currently has no symptoms of dizziness or gait instability.  He does have very mild small vessel disease by MRI, he should go back on low-dose aspirin taking 81 mg daily.  He appears to have a REM sleep behavior disorder, if this remains a problem, it can be treated with low-dose clonazepam in the evening to suppress REM sleep.  He will follow-up here on an as-needed basis.  He is to go home and take his blood pressure medications now as he missed the dose this morning.  Jill Alexanders MD 10/08/2018 11:32 AM  [facility] [address] [place], [place] [address]  Phone [phone] Fax [phone]

## 2018-12-09 DIAGNOSIS — N179 Acute kidney failure, unspecified: Secondary | ICD-10-CM | POA: Diagnosis not present

## 2018-12-09 DIAGNOSIS — E1122 Type 2 diabetes mellitus with diabetic chronic kidney disease: Secondary | ICD-10-CM | POA: Diagnosis not present

## 2018-12-09 DIAGNOSIS — N2581 Secondary hyperparathyroidism of renal origin: Secondary | ICD-10-CM | POA: Diagnosis not present

## 2018-12-09 DIAGNOSIS — I129 Hypertensive chronic kidney disease with stage 1 through stage 4 chronic kidney disease, or unspecified chronic kidney disease: Secondary | ICD-10-CM | POA: Diagnosis not present

## 2018-12-09 DIAGNOSIS — N183 Chronic kidney disease, stage 3 (moderate): Secondary | ICD-10-CM | POA: Diagnosis not present

## 2018-12-09 DIAGNOSIS — D509 Iron deficiency anemia, unspecified: Secondary | ICD-10-CM | POA: Diagnosis not present

## 2019-01-13 ENCOUNTER — Encounter: Payer: Self-pay | Admitting: Emergency Medicine

## 2019-01-13 DIAGNOSIS — I10 Essential (primary) hypertension: Secondary | ICD-10-CM | POA: Diagnosis not present

## 2019-01-13 DIAGNOSIS — G2581 Restless legs syndrome: Secondary | ICD-10-CM | POA: Diagnosis not present

## 2019-01-13 DIAGNOSIS — Z0000 Encounter for general adult medical examination without abnormal findings: Secondary | ICD-10-CM | POA: Diagnosis not present

## 2019-01-13 DIAGNOSIS — E1122 Type 2 diabetes mellitus with diabetic chronic kidney disease: Secondary | ICD-10-CM | POA: Diagnosis not present

## 2019-01-13 DIAGNOSIS — Z23 Encounter for immunization: Secondary | ICD-10-CM | POA: Diagnosis not present

## 2019-01-13 DIAGNOSIS — E785 Hyperlipidemia, unspecified: Secondary | ICD-10-CM | POA: Diagnosis not present

## 2019-01-13 DIAGNOSIS — H9190 Unspecified hearing loss, unspecified ear: Secondary | ICD-10-CM | POA: Diagnosis not present

## 2019-01-13 DIAGNOSIS — I679 Cerebrovascular disease, unspecified: Secondary | ICD-10-CM | POA: Diagnosis not present

## 2019-05-09 ENCOUNTER — Ambulatory Visit: Payer: Medicare HMO | Attending: Internal Medicine

## 2019-05-09 DIAGNOSIS — Z23 Encounter for immunization: Secondary | ICD-10-CM | POA: Insufficient documentation

## 2019-05-09 NOTE — Progress Notes (Signed)
   Covid-19 Vaccination Clinic  Name:  BREK REECE    MRN: [id number] DOB: [birth year]  05/09/2019  Mr. Condie was observed post Covid-19 immunization for 15 minutes without incidence. He was provided with Vaccine Information Sheet and instruction to access the V-Safe system.   Mr. Garabedian was instructed to call 911 with any severe reactions post vaccine: Marland Kitchen Difficulty breathing  . Swelling of your face and throat  . A fast heartbeat  . A bad rash all over your body  . Dizziness and weakness    Immunizations Administered    Name Date Dose VIS Date Route   Pfizer COVID-19 Vaccine 05/09/2019  3:46 PM 0.3 mL 02/21/2019 Intramuscular   Manufacturer: [employer], [employer]   Lot: [id number]   NDC: [address]-9

## 2019-06-02 DIAGNOSIS — S065X9A Traumatic subdural hemorrhage with loss of consciousness of unspecified duration, initial encounter: Secondary | ICD-10-CM | POA: Diagnosis not present

## 2019-06-02 DIAGNOSIS — N183 Chronic kidney disease, stage 3 unspecified: Secondary | ICD-10-CM | POA: Diagnosis not present

## 2019-06-02 DIAGNOSIS — I129 Hypertensive chronic kidney disease with stage 1 through stage 4 chronic kidney disease, or unspecified chronic kidney disease: Secondary | ICD-10-CM | POA: Diagnosis not present

## 2019-06-02 DIAGNOSIS — N179 Acute kidney failure, unspecified: Secondary | ICD-10-CM | POA: Diagnosis not present

## 2019-06-02 DIAGNOSIS — N2581 Secondary hyperparathyroidism of renal origin: Secondary | ICD-10-CM | POA: Diagnosis not present

## 2019-06-02 DIAGNOSIS — D509 Iron deficiency anemia, unspecified: Secondary | ICD-10-CM | POA: Diagnosis not present

## 2019-06-02 DIAGNOSIS — E1122 Type 2 diabetes mellitus with diabetic chronic kidney disease: Secondary | ICD-10-CM | POA: Diagnosis not present

## 2019-06-04 ENCOUNTER — Ambulatory Visit: Payer: Medicare HMO | Attending: Internal Medicine

## 2019-06-04 DIAGNOSIS — Z23 Encounter for immunization: Secondary | ICD-10-CM

## 2019-06-04 NOTE — Progress Notes (Signed)
   Covid-19 Vaccination Clinic  Name:  Juan Goodwin    MRN: [id number] DOB: Sep 27, 1935  06/04/2019  Juan Goodwin was observed post Covid-19 immunization for 15 minutes without incident. He was provided with Vaccine Information Sheet and instruction to access the V-Safe system.   Juan Goodwin was instructed to call 911 with any severe reactions post vaccine: Marland Kitchen Difficulty breathing  . Swelling of face and throat  . A fast heartbeat  . A bad rash all over body  . Dizziness and weakness   Immunizations Administered    Name Date Dose VIS Date Route   Pfizer COVID-19 Vaccine 06/04/2019  1:34 PM 0.3 mL 02/21/2019 Intramuscular   Manufacturer: [employer], [employer]   Lot: [id number]   NDC: [address]-2

## 2019-07-14 DIAGNOSIS — R0602 Shortness of breath: Secondary | ICD-10-CM | POA: Diagnosis not present

## 2019-07-14 DIAGNOSIS — R0789 Other chest pain: Secondary | ICD-10-CM | POA: Diagnosis not present

## 2019-07-14 DIAGNOSIS — E1122 Type 2 diabetes mellitus with diabetic chronic kidney disease: Secondary | ICD-10-CM | POA: Diagnosis not present

## 2019-07-15 ENCOUNTER — Telehealth: Payer: Self-pay

## 2019-07-15 NOTE — Telephone Encounter (Signed)
ekg on file rj

## 2019-07-30 ENCOUNTER — Ambulatory Visit: Payer: Medicare HMO | Admitting: Cardiology

## 2019-07-30 ENCOUNTER — Encounter: Payer: Self-pay | Admitting: Cardiology

## 2019-07-30 ENCOUNTER — Other Ambulatory Visit: Payer: Self-pay

## 2019-07-30 VITALS — BP 172/110 | HR 99 | Ht 74.0 in | Wt 184.2 lb

## 2019-07-30 DIAGNOSIS — R0600 Dyspnea, unspecified: Secondary | ICD-10-CM | POA: Diagnosis not present

## 2019-07-30 DIAGNOSIS — R079 Chest pain, unspecified: Secondary | ICD-10-CM

## 2019-07-30 DIAGNOSIS — R0609 Other forms of dyspnea: Secondary | ICD-10-CM

## 2019-07-30 DIAGNOSIS — I10 Essential (primary) hypertension: Secondary | ICD-10-CM | POA: Diagnosis not present

## 2019-07-30 MED ORDER — CARVEDILOL 6.25 MG PO TABS: 6.2500 mg | ORAL_TABLET | Freq: Two times a day (BID) | ORAL | 3 refills | Status: DC

## 2019-07-30 NOTE — Progress Notes (Signed)
Cardiology Consult  Note    Date:  07/30/2019   ID:  Juan Goodwin, DOB 1935/11/21, MRN [id number]  PCP:  Daisy Floro, MD  Cardiologist:  Armanda Magic, MD   Chief Complaint  Patient presents with  . New Patient (Initial Visit)    Chest pain and SOB    History of Present Illness:  Juan Goodwin is a 84 y.o. male who is being seen today for the evaluation of chest pain and SOB at the request of Daisy Floro, MD.  Thi si s an 84yo AAM with a hx of CVA, UGIB, CKD stage 3, HTN, orthostatic hypotension, subdural hematoma in 2019 who is referred for evaluation of chest pain and SOB. He has no hx of cardiac dz in the past but is a former smoker and quit 15 yrs ago.  His mother had a hx of CHF.  He does not know what his father died of and he is an only child.  He recently has started having problems with exertional chest tightness.  This pretty much comes on each time he exerts himself.  It is midsternal with no radiation and no associated nausea or palpitations.  He occasionally will have some diaphoresis with it.  He has had some mild DOE as well.  He denies any PND, orthopnea, LE edema, dizziness, palpitations or syncope.      Past Medical History:  Diagnosis Date  . Acute blood loss anemia 07/10/2014  . Acute kidney injury superimposed on chronic kidney disease (HCC) 04/10/2017  . Acute pyloric channel ulcer 07/13/2014  . Acute upper GI bleed 07/13/2014  . ARF (acute renal failure) (HCC) 07/13/2014  . Cerebrovascular disease 10/08/2018  . Chronic kidney disease (CKD), stage III (moderate) 03/30/2017  . Dehydration 04/10/2017  . Gastrointestinal hemorrhage with melena   . GI bleed 07/11/2014  . Global aphasia 03/27/2017  . Helicobacter pylori gastritis 07/13/2014  . High cholesterol   . Hypernatremia 07/13/2014  . Hypertension   . Hypokalemia 07/13/2014  . Hypotension 07/13/2014  . Melena 07/11/2014  . Normocytic normochromic anemia 04/10/2017  . Orthostatic hypotension 03/27/2017  .  REM sleep behavior disorder 10/08/2018  . Renal insufficiency   . Subdural hematoma (HCC) 03/26/2017  . Thrombocytopenia (HCC) 07/13/2014  . Weakness 04/10/2017    Past Surgical History:  Procedure Laterality Date  . BRAIN SURGERY    . CRANIOTOMY Left 03/26/2017   Procedure: CRANIOTOMY HEMATOMA EVACUATION SUBDURAL;  Surgeon: Donalee Citrin, MD;  Location: [facility] OR;  Service: Neurosurgery;  Laterality: Left;  . ESOPHAGOGASTRODUODENOSCOPY N/A 07/11/2014   Procedure: ESOPHAGOGASTRODUODENOSCOPY (EGD);  Surgeon: Charlott Rakes, MD;  Location: [facility];  Service: Endoscopy;  Laterality: N/A;    Current Medications: Current Meds  Medication Sig  . amLODipine (NORVASC) 5 MG tablet Take 1 tablet (5 mg total) by mouth daily.  . Multiple Vitamins-Minerals (MULTIVITAMIN ADULTS 50+ PO) Take 1 tablet by mouth daily.  . naproxen sodium (ALEVE) 220 MG tablet Take 220 mg by mouth daily as needed (pain).  . Omega-3 Fatty Acids (FISH OIL PO) Take 1 capsule by mouth daily.  . paricalcitol (ZEMPLAR) 1 MCG capsule Take 1 mcg by mouth daily.  . rosuvastatin (CRESTOR) 10 MG tablet Take 10 mg by mouth daily.  . [DISCONTINUED] carvedilol (COREG) 3.125 MG tablet Take 1 tablet (3.125 mg total) by mouth 2 (two) times daily with a meal.    Allergies:   Patient has no known allergies.   Social History   Socioeconomic History  .  Marital status: Widowed    Spouse name: Not on file  . Number of children: 3  . Years of education: 40  . Highest education level: Not on file  Occupational History  . Not on file  Tobacco Use  . Smoking status: Former Smoker    Packs/day: 1.00    Years: 35.00    Pack years: 35.00  . Smokeless tobacco: Never Used  Substance and Sexual Activity  . Alcohol use: No  . Drug use: Not Currently  . Sexual activity: Never  Other Topics Concern  . Not on file  Social History Narrative   Right handed    Occasional cup of coffee    Lives at home alone   Wife is deceased along with 2  children 1 child is living     Social Determinants of Corporate investment banker Strain:   . Difficulty of Paying Living Expenses:   Food Insecurity:   . Worried About Programme researcher, broadcasting/film/video in the Last Year:   . Barista in the Last Year:   Transportation Needs:   . Freight forwarder (Medical):   Marland Kitchen Lack of Transportation (Non-Medical):   Physical Activity:   . Days of Exercise per Week:   . Minutes of Exercise per Session:   Stress:   . Feeling of Stress :   Social Connections:   . Frequency of Communication with Friends and Family:   . Frequency of Social Gatherings with Friends and Family:   . Attends Religious Services:   . Active Member of Clubs or Organizations:   . Attends Banker Meetings:   Marland Kitchen Marital Status:      Family History:  The patient's family history includes Heart failure in his mother.   ROS:   Please see the history of present illness.    ROS All other systems reviewed and are negative.  No flowsheet data found.     PHYSICAL EXAM:   VS:  BP (!) 172/110   Pulse 99   Ht 6\' 2"  (1.88 m)   Wt 184 lb 3.2 oz (83.6 kg)   SpO2 97%   BMI 23.65 kg/m    GEN: Well nourished, well developed, in no acute distress  HEENT: normal  Neck: no JVD, carotid bruits, or masses Cardiac: RRR; no murmurs, rubs, or gallops,no edema.  Intact distal pulses bilaterally.  Respiratory:  clear to auscultation bilaterally, normal work of breathing GI: soft, nontender, nondistended, + BS MS: no deformity or atrophy  Skin: warm and dry, no rash Neuro:  Alert and Oriented x 3, Strength and sensation are intact Psych: euthymic mood, full affect  Wt Readings from Last 3 Encounters:  07/30/19 184 lb 3.2 oz (83.6 kg)  10/08/18 187 lb (84.8 kg)  06/08/18 192 lb (87.1 kg)      Studies/Labs Reviewed:   EKG:  EKG is ordered today.  The ekg ordered today demonstrates NSR at 99bpm with no ST changes  Recent Labs: No results found for requested labs  within last 8760 hours.   Lipid Panel No results found for: CHOL, TRIG, HDL, CHOLHDL, VLDL, LDLCALC, LDLDIRECT  Additional studies/ records that were reviewed today include:  Office notes from PCP    ASSESSMENT:    1. Chest pain of uncertain etiology   2. DOE (dyspnea on exertion)   3. Essential hypertension      PLAN:  In order of problems listed above:  1. Chest pain -his symptoms are worrisome  for possible exertional angina -EKG is nonischemic -BP is poorly controlled and may be contributing to his CP with resultant demand ischemiaI  -will get a Lexiscan nuclear stress test to assess for ischemia  2.  SOB -this is mild and may be due to deconditionig -check 2D echo to assess LVF  3.  HTN -BP poorly controlled but has not taken his carvedilol which is very low dose -increase Carvedilol to 6.25mg  BID and followup with PharmD in HTN clinic in 1 week    Medication Adjustments/Labs and Tests Ordered: Current medicines are reviewed at length with the patient today.  Concerns regarding medicines are outlined above.  Medication changes, Labs and Tests ordered today are listed in the Patient Instructions below.  There are no Patient Instructions on file for this visit.   Signed, Fransico Him, MD  07/30/2019 11:45 AM    [address], [place], [place]  [address] Phone: [phone]; Fax: [phone]

## 2019-07-30 NOTE — Patient Instructions (Addendum)
Medication Instructions:  Your physician has recommended you make the following change in your medication:  1) INCREASE carvedilol to 6.25mg  twice per day  *If you need a refill on your cardiac medications before your next appointment, please call your pharmacy*  Testing/Procedures: Your physician has requested that you have an echocardiogram. Echocardiography is a painless test that uses sound waves to create images of your heart. It provides your doctor with information about the size and shape of your heart and how well your heart's chambers and valves are working. This procedure takes approximately one hour. There are no restrictions for this procedure.  Your physician has requested that you have a lexiscan myoview. For further information please visit [email or web address] Please follow instruction sheet, as given.  Follow-Up: At [facility], you and your health needs are our priority.  As part of our continuing mission to provide you with exceptional heart care, we have created designated Provider Care Teams.  These Care Teams include your primary Cardiologist (physician) and Advanced Practice Providers (APPs -  Physician Assistants and Nurse Practitioners) who all work together to provide you with the care you need, when you need it.  We recommend signing up for the patient portal called "MyChart".  Sign up information is provided on this After Visit Summary.  MyChart is used to connect with patients for Virtual Visits (Telemedicine).  Patients are able to view lab/test results, encounter notes, upcoming appointments, etc.  Non-urgent messages can be sent to your provider as well.   To learn more about what you can do with MyChart, go to [email or web address].    Follow up with Dr. Mayford Knife as needed based on results of testing  Follow up with PharmD in the Hypertension clinic in one week.

## 2019-07-30 NOTE — Addendum Note (Signed)
Addended by: Theresia Majors on: 07/30/2019 11:53 AM   Modules accepted: Orders

## 2019-08-08 ENCOUNTER — Ambulatory Visit (INDEPENDENT_AMBULATORY_CARE_PROVIDER_SITE_OTHER): Payer: Medicare HMO | Admitting: Pharmacist

## 2019-08-08 ENCOUNTER — Other Ambulatory Visit: Payer: Self-pay

## 2019-08-08 VITALS — BP 110/70 | HR 83

## 2019-08-08 DIAGNOSIS — E782 Mixed hyperlipidemia: Secondary | ICD-10-CM

## 2019-08-08 DIAGNOSIS — I10 Essential (primary) hypertension: Secondary | ICD-10-CM

## 2019-08-08 DIAGNOSIS — E785 Hyperlipidemia, unspecified: Secondary | ICD-10-CM | POA: Insufficient documentation

## 2019-08-08 NOTE — Progress Notes (Signed)
Patient ID: Juan Goodwin                 DOB: June 16, 1935                      MRN: [id number]     HPI: Juan Goodwin is a 84 y.o. male referred by Dr. Radford Pax to HTN clinic. PMH is significant for CVA, upper GI bleed, CKD stage 3, HTN, orthostatic hypotension, subdural hematoma in 2019 who was referred to cardiology earlier this month for evaluation of exertional chest pain and SOB. Pt was seen in clinic on 5/19 and BP was elevated at 172/110. His carvedilol was increased to 6.25mg  BID. He has been scheduled for nuclear stress test and echo.  Pt presents today in good spirits. Reports feeling a little dizzy and woozy on higher dose of carvedilol 6.25mg  BID. States he took this dose previously and felt the same. Occasional headaches as well. He brings in a med list that shows him taking 10mg  instead of 5mg  of amlodipine which our med list states, this has been updated. He is not sure if he's taking rosuvastatin (on our med list but not his). Removed naproxen from his med list as pt states he does not use this. States multiple MDs have tried adjusting his BP medications including PCP, nephrologist, and Dr Radford Pax. Has not checked his BP at home since increasing his carvedilol dose, but prior to this dose change, reports home BP readings ranged 140/80s.  BP low in clinic - 98/70 in right arm, ~108-112/70 in left arm.  Current HTN meds: amlodipine 10mg  daily, carvedilol 6.25mg  BID  BP goal: <130/43mmHg  Family History: Heart failure in his mother.  Social History: Former smoker 1 PPD for 35 years, quit 15 years ago. Denies alcohol and illicit drug use.  Diet: Likes chicken and vegetables. Eats out frequently. Goes to [employer] - steak and gravy, vegetables. Likes strawberry shortcake. Tries to avoid adding too much salt to his food. Drinks tea - thinks it's caffeinated  Exercise: Not much  Home BP readings: has not checked since dose increase of carvedilol. Prevoiusly ran 140/80-90 at  home  Labs: 01/13/19: TC 266, LDL 156, TG 166, HDL 37 12/09/18: SCr 1.53 3/28: SCr 1.61, Na 136, K 3.9  Wt Readings from Last 3 Encounters:  07/30/19 184 lb 3.2 oz (83.6 kg)  10/08/18 187 lb (84.8 kg)  06/08/18 192 lb (87.1 kg)   BP Readings from Last 3 Encounters:  07/30/19 (!) 172/110  10/08/18 (!) 184/116  06/08/18 (!) 158/101   Pulse Readings from Last 3 Encounters:  07/30/19 99  10/08/18 (!) 103  06/08/18 95    Renal function: CrCl cannot be calculated (Patient's most recent lab result is older than the maximum 21 days allowed.).  Past Medical History:  Diagnosis Date  . Acute blood loss anemia 07/10/2014  . Acute kidney injury superimposed on chronic kidney disease ([place]) 04/10/2017  . Acute pyloric channel ulcer 07/13/2014  . Acute upper GI bleed 07/13/2014  . ARF (acute renal failure) ([place]) 07/13/2014  . Cerebrovascular disease 10/08/2018  . Chronic kidney disease (CKD), stage III (moderate) 03/30/2017  . Dehydration 04/10/2017  . Gastrointestinal hemorrhage with melena   . GI bleed 07/11/2014  . Global aphasia 03/27/2017  . Helicobacter pylori gastritis 07/13/2014  . High cholesterol   . Hypernatremia 07/13/2014  . Hypertension   . Hypokalemia 07/13/2014  . Hypotension 07/13/2014  . Melena 07/11/2014  . Normocytic normochromic anemia  04/10/2017  . Orthostatic hypotension 03/27/2017  . REM sleep behavior disorder 10/08/2018  . Renal insufficiency   . Subdural hematoma (HCC) 03/26/2017  . Thrombocytopenia (HCC) 07/13/2014  . Weakness 04/10/2017    Current Outpatient Medications on File Prior to Visit  Medication Sig Dispense Refill  . amLODipine (NORVASC) 5 MG tablet Take 1 tablet (5 mg total) by mouth daily. 30 tablet 0  . carvedilol (COREG) 6.25 MG tablet Take 1 tablet (6.25 mg total) by mouth 2 (two) times daily. 180 tablet 3  . Multiple Vitamins-Minerals (MULTIVITAMIN ADULTS 50+ PO) Take 1 tablet by mouth daily.    . naproxen sodium (ALEVE) 220 MG tablet Take 220 mg by mouth  daily as needed (pain).    . Omega-3 Fatty Acids (FISH OIL PO) Take 1 capsule by mouth daily.    . paricalcitol (ZEMPLAR) 1 MCG capsule Take 1 mcg by mouth daily.  5  . rosuvastatin (CRESTOR) 10 MG tablet Take 10 mg by mouth daily.     No current facility-administered medications on file prior to visit.    No Known Allergies   Assessment/Plan:  1. Hypertension - BP surprisingly low today 90s-110/70s, compared to last 4 clinic BP readings which ranged 158-188/101-110. Pt states he didn't take his BP meds before his last appt with Dr Mayford Knife but did take his meds this AM. Will decrease carvedilol back to 3.125mg  BID due to low BP today and pt reports of feeling woozy and dizzy on higher dose. Will continue amlodipine 10mg  daily. Encouraged pt to limit daily sodium intake to 000mg  and to switch to decaf tea. Encouraged pt to start monitoring and recording BP and to bring readings to next visit. Scheduled f/u 6/10 since pt will already be here for stress test and echo that day.  2. Hyperlipidemia - Med list shows pt taking rosuvastatin 10mg  daily, although pt is not sure that he's actually taking this. He will check his meds when he gets home. Most recent LDL 156 in Nov 2020 above goal < 70 due to history of CVA. Ideally prefer to increase to high intensity rosuvastatin (does have CKD with SCr ~1.5). Will wait for pt to confirm what he's taking at home first and can increase dosage at next visit.  Shaliah Wann E. Keriann Rankin, PharmD, BCACP, CPP [facility] [address] [place], [address] [place] Phone: [phone]; Fax: [phone] 08/08/2019 2:03 PM

## 2019-08-08 NOTE — Patient Instructions (Addendum)
Decrease your carvedilol back to 3.125mg  twice daily. You can cut your 6.25mg  tablets in half and take 1/2 tablet in the morning and 1/2 tablet in the evening until you run out  Continue taking your other medications  See if you have rosuvastatin (Crestor) at home for your cholesterol.  Use Tylenol (acetaminophen) if needed for pain. Avoid using NSAIDs like Advil (ibuprofen) or Aleve (naproxen)  Try to limit your salt to < 2,000mg  daily  Look for decaf tea  Monitor and record your blood pressure at home. Bring your readings to your next visit

## 2019-08-20 ENCOUNTER — Telehealth (HOSPITAL_COMMUNITY): Payer: Self-pay

## 2019-08-20 NOTE — Telephone Encounter (Signed)
Detailed instructions left on the patient's machine. Asked to call back with any questions. S.Zakiyyah Savannah EMTP 

## 2019-08-21 ENCOUNTER — Ambulatory Visit (HOSPITAL_COMMUNITY): Payer: Medicare HMO | Attending: Cardiovascular Disease

## 2019-08-21 ENCOUNTER — Ambulatory Visit (HOSPITAL_BASED_OUTPATIENT_CLINIC_OR_DEPARTMENT_OTHER): Payer: Medicare HMO

## 2019-08-21 ENCOUNTER — Other Ambulatory Visit: Payer: Self-pay

## 2019-08-21 ENCOUNTER — Ambulatory Visit (INDEPENDENT_AMBULATORY_CARE_PROVIDER_SITE_OTHER): Payer: Medicare HMO | Admitting: Pharmacist

## 2019-08-21 ENCOUNTER — Ambulatory Visit: Payer: Medicare HMO

## 2019-08-21 VITALS — BP 140/90 | HR 69

## 2019-08-21 DIAGNOSIS — R079 Chest pain, unspecified: Secondary | ICD-10-CM | POA: Insufficient documentation

## 2019-08-21 DIAGNOSIS — R0600 Dyspnea, unspecified: Secondary | ICD-10-CM | POA: Insufficient documentation

## 2019-08-21 DIAGNOSIS — R0609 Other forms of dyspnea: Secondary | ICD-10-CM

## 2019-08-21 DIAGNOSIS — I10 Essential (primary) hypertension: Secondary | ICD-10-CM

## 2019-08-21 LAB — ECHOCARDIOGRAM COMPLETE
Height: 74 in
Weight: 2944 oz

## 2019-08-21 LAB — MYOCARDIAL PERFUSION IMAGING
LV dias vol: 77 mL (ref 62–150)
LV sys vol: 39 mL
Peak HR: 87 {beats}/min
Rest HR: 67 {beats}/min
SDS: 1
SRS: 0
SSS: 1
TID: 0.93

## 2019-08-21 MED ORDER — ROSUVASTATIN CALCIUM 20 MG PO TABS
20.0000 mg | ORAL_TABLET | Freq: Every day | ORAL | 3 refills | Status: DC
Start: 2019-08-21 — End: 2020-04-27

## 2019-08-21 MED ORDER — TECHNETIUM TC 99M TETROFOSMIN IV KIT
32.0000 | PACK | Freq: Once | INTRAVENOUS | Status: AC | PRN
  Administered 2019-08-21: 32 via INTRAVENOUS
  Filled 2019-08-21: qty 32

## 2019-08-21 MED ORDER — TECHNETIUM TC 99M TETROFOSMIN IV KIT
10.9000 | PACK | Freq: Once | INTRAVENOUS | Status: AC | PRN
  Administered 2019-08-21: 10.9 via INTRAVENOUS
  Filled 2019-08-21: qty 11

## 2019-08-21 MED ORDER — REGADENOSON 0.4 MG/5ML IV SOLN
0.4000 mg | Freq: Once | INTRAVENOUS | Status: AC
  Administered 2019-08-21: 0.4 mg via INTRAVENOUS

## 2019-08-21 NOTE — Progress Notes (Signed)
Patient ID: QUINT CHESTNUT                 DOB: Nov 20, 1935                      MRN: [id number]     HPI: Juan Goodwin is a 84 y.o. male referred by Dr. Mayford Knife to HTN clinic. PMH is significant for CVA, upper GI bleed, CKD stage 3, HTN, orthostatic hypotension, subdural hematoma in 2019 who was referred to cardiology earlier this month for evaluation of exertional chest pain and SOB. Pt was seen in clinic on 5/19 and BP was elevated at 172/110. His carvedilol was increased to 6.25mg  BID. He has been scheduled for nuclear stress test and echo.Getting both of these tests done today.  Patient was seen in HTN clinic on 08/08/19. His blood pressure was 110/70. He reported feeling woozy. Carvedilol was decreased back to 3.125mg  BID. He reports that he did not take his BP meds prior to appointment with Dr. Mayford Knife. He was asked to bring his home blood pressure readings to next appointment. He was unsure if he was taking rosuvastatin and he was asked to look at medications at home and let us know at next visit. Last scr 1.98 crcl 34.39ml/min. Patient has been seen in the ER several times for syncope or near syncope.   Patient presents today for follow up. He denies dizziness, lightheadedness, headache, blurred vision, SOB or swelling. He states he feels much better on the lower dose of carvedilol. He hands me a medication list that has olmesartan on it. I called his pharmacy and they had no record of olmesartan. He is still unsure if he is taking rosuvastatin, not on his med list, but was on ours. I called his pharmacy and they state he hasn't filled rosuvastatin since Mar 30 2018 and amlodipine since June 09, 2018. Patient states he doesn't use any other pharmacy. Pharmacy states they only thing they have filled lately was carvedilol.   Current HTN meds: amlodipine 10mg  daily, carvedilol 3.125mg  BID Previously tried: carvedilol 6.25mg  BID (whoozy, headache)  BP goal: <130/9mmHg  Family History: Heart  failure in his mother.  Social History: Former smoker 1 PPD for 35 years, quit 15 years ago. Denies alcohol and illicit drug use.  Diet: Likes chicken and vegetables. Eats out frequently. Goes to [employer] - steak and gravy, vegetables. Likes strawberry shortcake. Tries to avoid adding too much salt to his food. Drinks tea - thinks it's caffeinated  Exercise: Not much  Home BP readings: highest 140/80, lowest 110/70  Labs: 01/13/19: TC 266, LDL 156, TG 166, HDL 37 06/02/19: SCr 1.98, K 3.9   Wt Readings from Last 3 Encounters:  07/30/19 184 lb 3.2 oz (83.6 kg)  10/08/18 187 lb (84.8 kg)  06/08/18 192 lb (87.1 kg)   BP Readings from Last 3 Encounters:  08/08/19 110/70  07/30/19 (!) 172/110  10/08/18 (!) 184/116   Pulse Readings from Last 3 Encounters:  08/08/19 83  07/30/19 99  10/08/18 (!) 103    Renal function: CrCl cannot be calculated (Patient's most recent lab result is older than the maximum 21 days allowed.).  Past Medical History:  Diagnosis Date  . Acute blood loss anemia 07/10/2014  . Acute kidney injury superimposed on chronic kidney disease (HCC) 04/10/2017  . Acute pyloric channel ulcer 07/13/2014  . Acute upper GI bleed 07/13/2014  . ARF (acute renal failure) (HCC) 07/13/2014  . Cerebrovascular disease 10/08/2018  .  Chronic kidney disease (CKD), stage III (moderate) 03/30/2017  . Dehydration 04/10/2017  . Gastrointestinal hemorrhage with melena   . GI bleed 07/11/2014  . Global aphasia 03/27/2017  . Helicobacter pylori gastritis 07/13/2014  . High cholesterol   . Hypernatremia 07/13/2014  . Hypertension   . Hypokalemia 07/13/2014  . Hypotension 07/13/2014  . Melena 07/11/2014  . Normocytic normochromic anemia 04/10/2017  . Orthostatic hypotension 03/27/2017  . REM sleep behavior disorder 10/08/2018  . Renal insufficiency   . Subdural hematoma ([place]) 03/26/2017  . Thrombocytopenia ([place]) 07/13/2014  . Weakness 04/10/2017    Current Outpatient Medications on File Prior to Visit    Medication Sig Dispense Refill  . amLODipine (NORVASC) 10 MG tablet Take 10 mg by mouth daily.    . carvedilol (COREG) 6.25 MG tablet Take 0.5 tablets (3.125 mg total) by mouth 2 (two) times daily. 180 tablet 3  . Multiple Vitamins-Minerals (MULTIVITAMIN ADULTS 50+ PO) Take 1 tablet by mouth daily.    . Omega-3 Fatty Acids (FISH OIL PO) Take 1 capsule by mouth daily.    . paricalcitol (ZEMPLAR) 1 MCG capsule Take 1 mcg by mouth daily.  5  . rosuvastatin (CRESTOR) 10 MG tablet Take 10 mg by mouth daily.     No current facility-administered medications on file prior to visit.    No Known Allergies   Assessment/Plan:  1. Hypertension - BP above goal of <130/80 today. Still unsure of what he is actually taking. I asked him to call me when he got home to tell me the dose of amlodipine he is taking and make sure he is taking it. Since it hasn't been filled in over a year I do not think compliance is great. Patient states he is taking his medication and that he uses a pill box. I will schedule follow when he calls, or will call him if I havent heard from him in a few days. He has had issues with hypotension and syncope in the past there I am hesitant to make a lot of changes to his blood pressure regimen.  2. Hyperlipidemia - LDL is above goal of <70. Per pharmacy patient hasn't filled rosuvastatin since jan of 2020. I will send an rx for rosuvastatin 20mg  daily. Patient educated to pick up and start taking once a day. Lipid panel in 3 months.   Ramond Dial, Pharm.D, BCPS, CPP [place]  [address] [place], [place] [address]  Phone: [phone]; Fax: [phone]  08/21/2019 9:42 AM

## 2019-08-21 NOTE — Patient Instructions (Addendum)
It was a pleasure to meet you!  Please call us when you get home and let us know what dose of amlodipine you are taking.  Please decrease the amount of tea you are drinking and do not put salt on your food.  Please start taking walks- start with 10-15 min at a time and increase as able. Goal is to go for a walk 5 days a week  Start taking rosuvastatin 20mg  daily  Please call at [phone]  Before checking your blood pressure make sure: You are seated and quite for 5 min before checking Feet are flat on the floor Siting in chair with your back supported straight up and down Arm resting on table or arm of chair at heart level Bladder is empty You have NOT had caffeine or tobacco within the last 30 min  Check your blood pressure 2-3 times about 1-2 min apart. Usually the first reading will be the highest. Record these readings.  Only check your blood pressure once a day, unless otherwise directed Record your blood pressure readings and bring them to all your appointments. If your meter stores your readings in its memory, then you may bring your blood pressure meter with you to your appointments.  You can find a list of validated (accurate) blood pressure cuffs at [phone]  Lifestyle changes can make a world of difference and are even more important than medications: Try to keep your sodium intake to 2300 mg of sodium per day Get 6-8 uninterrupted hours of sleep per night Aim for a goal of 150 min of moderate aerobic exercise (ie brisk walking, bike riding) per week

## 2019-08-25 ENCOUNTER — Telehealth: Payer: Self-pay | Admitting: Pharmacist

## 2019-08-25 NOTE — Telephone Encounter (Signed)
Called patient to follow up on if he is taking amlodipine and its strength. No answer. LVM for patient to call back

## 2019-08-26 MED ORDER — AMLODIPINE BESYLATE 5 MG PO TABS
5.0000 mg | ORAL_TABLET | Freq: Every day | ORAL | 3 refills | Status: DC
Start: 2019-08-26 — End: 2020-04-27

## 2019-08-26 NOTE — Telephone Encounter (Signed)
Spoke with patient who says he has amlodipine 5mg  tablets at home. States he only has about 5 tablets left. States he has been taking and its from [facility], although [facility] told me they haven't filled in over a year. I will send in new Rx. I have asked patient to write down his blood pressure readings and bring them along with his meter to his appointment on 6/25.

## 2019-08-26 NOTE — Addendum Note (Signed)
Addended by: Malena Peer D on: 08/26/2019 10:48 AM   Modules accepted: Orders

## 2019-09-05 ENCOUNTER — Ambulatory Visit (INDEPENDENT_AMBULATORY_CARE_PROVIDER_SITE_OTHER): Payer: Medicare HMO | Admitting: Pharmacist

## 2019-09-05 ENCOUNTER — Other Ambulatory Visit: Payer: Self-pay

## 2019-09-05 VITALS — BP 124/80 | HR 72

## 2019-09-05 DIAGNOSIS — I10 Essential (primary) hypertension: Secondary | ICD-10-CM

## 2019-09-05 NOTE — Patient Instructions (Addendum)
It was good seeing you today.  We want to keep your blood pressure less than 130/80  Continue taking your amlodipine in the morning and your carvedilol twice a day  Try to start walking 5 days a week  Give [place] a call with any questions!  Laural Golden, PharmD, BCACP, CDCES [facility] [address] [place], [place] [address] Phone: [phone]; Fax: [phone] 09/05/2019 1:48 PM

## 2019-09-05 NOTE — Progress Notes (Signed)
Patient ID: Juan Goodwin                 DOB: [birth year]                      MRN: [id number]     HPI: DRU PRIMEAU is a 84 y.o. male referred by Dr. Mayford Knife to HTN clinic. PMH is significant for CKD, upper GI bleed, HTN, and HLD.    Patient was seen in HTN clinic on 08/08/19. His blood pressure was 110/70. He reported feeling woozy. Carvedilol was decreased back to 3.125mg  BID. He reports that he did not take his BP meds prior to appointment with Dr. Mayford Knife. He was asked to bring his home blood pressure readings to next appointment. He was unsure if he was taking rosuvastatin and he was asked to look at medications at home and let us know at next visit. Last scr 1.98 crcl 34.49ml/min. Patient has been seen in the ER several times for syncope or near syncope.   At previous visit there were questions regarding which medications patient was taking.  Had olmesartan on his med list, yet pharmacy had no record of it being filled.  Pharmacy also stated they had not filled amlodipine or rosuvastatin in over a year.  Patient stated he was not using any other pharmacies.    Patient presents today in good spirits but is still not completely sure which medications he is taking.  Remarks that his BP meds have always been frequently changed.  Brought home BP log.  Date/Morning/Evening 6/19: 111/61 142/87 6/20: 149/99 161/104 6/21: 107/56 108/61 6/22: 117/63 127/68 6/23: 157/89 151/91 6/24: 160/92 162/97 6/25: 142/62  Patient lives alone and is unable to remember what meals he has eaten.  Does say that he remembers to take his medications however.  Wakes up around 10 am and then takes his meds and BP.  Reports occasional afternoon dizziness but is not sure of the cause.  Takes afternoon meds at 6pm.  Current HTN meds: amlodipine 5 mg daily, carvedilol 3.125 mg BID.  Patient brought med list which states amlodipine 10 mg.  He is not sure which one he is taking.   Previously tried: olmesartan 10 mg  BP  goal: <130/80  Social History: Former smoker, 35 year pack history  Diet: Rarely eats at home.  Eats out for most meals, usually [employer]  Exercise:  Has not started yet  Home BP readings:   Wt Readings from Last 3 Encounters:  08/21/19 184 lb (83.5 kg)  07/30/19 184 lb 3.2 oz (83.6 kg)  10/08/18 187 lb (84.8 kg)   BP Readings from Last 3 Encounters:  08/21/19 140/90  08/08/19 110/70  07/30/19 (!) 172/110   Pulse Readings from Last 3 Encounters:  08/21/19 69  08/08/19 83  07/30/19 99    Renal function: CrCl cannot be calculated (Patient's most recent lab result is older than the maximum 21 days allowed.).  Past Medical History:  Diagnosis Date  . Acute blood loss anemia 07/10/2014  . Acute kidney injury superimposed on chronic kidney disease (HCC) 04/10/2017  . Acute pyloric channel ulcer 07/13/2014  . Acute upper GI bleed 07/13/2014  . ARF (acute renal failure) (HCC) 07/13/2014  . Cerebrovascular disease 10/08/2018  . Chronic kidney disease (CKD), stage III (moderate) 03/30/2017  . Dehydration 04/10/2017  . Gastrointestinal hemorrhage with melena   . GI bleed 07/11/2014  . Global aphasia 03/27/2017  . Helicobacter pylori gastritis 07/13/2014  .  High cholesterol   . Hypernatremia 07/13/2014  . Hypertension   . Hypokalemia 07/13/2014  . Hypotension 07/13/2014  . Melena 07/11/2014  . Normocytic normochromic anemia 04/10/2017  . Orthostatic hypotension 03/27/2017  . REM sleep behavior disorder 10/08/2018  . Renal insufficiency   . Subdural hematoma ([place]) 03/26/2017  . Thrombocytopenia ([place]) 07/13/2014  . Weakness 04/10/2017    Current Outpatient Medications on File Prior to Visit  Medication Sig Dispense Refill  . amLODipine (NORVASC) 5 MG tablet Take 1 tablet (5 mg total) by mouth daily. 90 tablet 3  . carvedilol (COREG) 6.25 MG tablet Take 0.5 tablets (3.125 mg total) by mouth 2 (two) times daily. 180 tablet 3  . Multiple Vitamins-Minerals (MULTIVITAMIN ADULTS 50+ PO) Take 1 tablet by  mouth daily.    . Omega-3 Fatty Acids (FISH OIL PO) Take 1 capsule by mouth daily.    . paricalcitol (ZEMPLAR) 1 MCG capsule Take 1 mcg by mouth daily.  5  . rosuvastatin (CRESTOR) 20 MG tablet Take 1 tablet (20 mg total) by mouth daily. 90 tablet 3   No current facility-administered medications on file prior to visit.    No Known Allergies   Assessment/Plan:  1. Hypertension - Patient BP today 124/80 which is at goal of <130/80.  BP throughout the week is variable, occasionally at goal and sometimes very high.  Patient reports med compliance but is not the best historian.  Does not remember any of his meals from the week.  Suspect some BP spikes may be dietary related since patient eats out for nearly every meal.  Recommended patient try to start walking daily as previously instructed and patient says he will start.  Counseled to refrain from salting food.  Patient voiced understanding.  Recheck as needed.  Karren Cobble, PharmD, BCACP, [place] [address] [place], [place] [address] Phone: [phone]; Fax: [phone] 09/05/2019 5:07 PM

## 2019-11-19 DIAGNOSIS — N2581 Secondary hyperparathyroidism of renal origin: Secondary | ICD-10-CM | POA: Diagnosis not present

## 2019-11-19 DIAGNOSIS — D509 Iron deficiency anemia, unspecified: Secondary | ICD-10-CM | POA: Diagnosis not present

## 2019-11-19 DIAGNOSIS — I129 Hypertensive chronic kidney disease with stage 1 through stage 4 chronic kidney disease, or unspecified chronic kidney disease: Secondary | ICD-10-CM | POA: Diagnosis not present

## 2019-11-19 DIAGNOSIS — E785 Hyperlipidemia, unspecified: Secondary | ICD-10-CM | POA: Diagnosis not present

## 2019-11-19 DIAGNOSIS — N179 Acute kidney failure, unspecified: Secondary | ICD-10-CM | POA: Diagnosis not present

## 2019-11-19 DIAGNOSIS — N183 Chronic kidney disease, stage 3 unspecified: Secondary | ICD-10-CM | POA: Diagnosis not present

## 2019-11-19 DIAGNOSIS — E1122 Type 2 diabetes mellitus with diabetic chronic kidney disease: Secondary | ICD-10-CM | POA: Diagnosis not present

## 2020-02-29 DIAGNOSIS — Z20822 Contact with and (suspected) exposure to covid-19: Secondary | ICD-10-CM | POA: Diagnosis not present

## 2020-04-25 ENCOUNTER — Inpatient Hospital Stay (HOSPITAL_COMMUNITY)
Admission: EM | Admit: 2020-04-25 | Discharge: 2020-04-27 | DRG: 281 | Disposition: A | Discharge status: Home / Self Care | Payer: Medicare HMO | Attending: Internal Medicine | Admitting: Internal Medicine

## 2020-04-25 ENCOUNTER — Inpatient Hospital Stay (HOSPITAL_COMMUNITY): Payer: Medicare HMO

## 2020-04-25 ENCOUNTER — Emergency Department (HOSPITAL_COMMUNITY): Payer: Medicare HMO

## 2020-04-25 ENCOUNTER — Encounter (HOSPITAL_COMMUNITY): Payer: Self-pay | Admitting: Internal Medicine

## 2020-04-25 DIAGNOSIS — E1165 Type 2 diabetes mellitus with hyperglycemia: Secondary | ICD-10-CM | POA: Diagnosis not present

## 2020-04-25 DIAGNOSIS — I161 Hypertensive emergency: Secondary | ICD-10-CM | POA: Diagnosis present

## 2020-04-25 DIAGNOSIS — E1122 Type 2 diabetes mellitus with diabetic chronic kidney disease: Secondary | ICD-10-CM | POA: Diagnosis present

## 2020-04-25 DIAGNOSIS — I214 Non-ST elevation (NSTEMI) myocardial infarction: Secondary | ICD-10-CM | POA: Diagnosis not present

## 2020-04-25 DIAGNOSIS — Z8673 Personal history of transient ischemic attack (TIA), and cerebral infarction without residual deficits: Secondary | ICD-10-CM | POA: Diagnosis not present

## 2020-04-25 DIAGNOSIS — I208 Other forms of angina pectoris: Secondary | ICD-10-CM | POA: Diagnosis not present

## 2020-04-25 DIAGNOSIS — I2510 Atherosclerotic heart disease of native coronary artery without angina pectoris: Secondary | ICD-10-CM | POA: Diagnosis not present

## 2020-04-25 DIAGNOSIS — R079 Chest pain, unspecified: Secondary | ICD-10-CM

## 2020-04-25 DIAGNOSIS — R0789 Other chest pain: Secondary | ICD-10-CM | POA: Diagnosis not present

## 2020-04-25 DIAGNOSIS — E785 Hyperlipidemia, unspecified: Secondary | ICD-10-CM | POA: Diagnosis not present

## 2020-04-25 DIAGNOSIS — I129 Hypertensive chronic kidney disease with stage 1 through stage 4 chronic kidney disease, or unspecified chronic kidney disease: Secondary | ICD-10-CM | POA: Diagnosis present

## 2020-04-25 DIAGNOSIS — H9190 Unspecified hearing loss, unspecified ear: Secondary | ICD-10-CM | POA: Diagnosis present

## 2020-04-25 DIAGNOSIS — Z87891 Personal history of nicotine dependence: Secondary | ICD-10-CM | POA: Diagnosis not present

## 2020-04-25 DIAGNOSIS — E876 Hypokalemia: Secondary | ICD-10-CM | POA: Diagnosis not present

## 2020-04-25 DIAGNOSIS — I10 Essential (primary) hypertension: Secondary | ICD-10-CM

## 2020-04-25 DIAGNOSIS — Z8249 Family history of ischemic heart disease and other diseases of the circulatory system: Secondary | ICD-10-CM

## 2020-04-25 DIAGNOSIS — N1832 Chronic kidney disease, stage 3b: Secondary | ICD-10-CM | POA: Diagnosis not present

## 2020-04-25 DIAGNOSIS — I25119 Atherosclerotic heart disease of native coronary artery with unspecified angina pectoris: Secondary | ICD-10-CM | POA: Diagnosis present

## 2020-04-25 DIAGNOSIS — I213 ST elevation (STEMI) myocardial infarction of unspecified site: Secondary | ICD-10-CM | POA: Diagnosis not present

## 2020-04-25 DIAGNOSIS — I160 Hypertensive urgency: Secondary | ICD-10-CM

## 2020-04-25 DIAGNOSIS — E7800 Pure hypercholesterolemia, unspecified: Secondary | ICD-10-CM | POA: Diagnosis present

## 2020-04-25 DIAGNOSIS — Z79899 Other long term (current) drug therapy: Secondary | ICD-10-CM | POA: Diagnosis not present

## 2020-04-25 DIAGNOSIS — R7989 Other specified abnormal findings of blood chemistry: Secondary | ICD-10-CM

## 2020-04-25 DIAGNOSIS — Z20822 Contact with and (suspected) exposure to covid-19: Secondary | ICD-10-CM | POA: Diagnosis present

## 2020-04-25 DIAGNOSIS — R5381 Other malaise: Secondary | ICD-10-CM | POA: Diagnosis present

## 2020-04-25 DIAGNOSIS — Z743 Need for continuous supervision: Secondary | ICD-10-CM | POA: Diagnosis not present

## 2020-04-25 DIAGNOSIS — D649 Anemia, unspecified: Secondary | ICD-10-CM | POA: Diagnosis not present

## 2020-04-25 DIAGNOSIS — R778 Other specified abnormalities of plasma proteins: Secondary | ICD-10-CM

## 2020-04-25 DIAGNOSIS — I259 Chronic ischemic heart disease, unspecified: Secondary | ICD-10-CM | POA: Diagnosis not present

## 2020-04-25 LAB — TROPONIN I (HIGH SENSITIVITY)
Troponin I (High Sensitivity): 18 ng/L — ABNORMAL HIGH (ref <18)
Troponin I (High Sensitivity): 72 ng/L — ABNORMAL HIGH (ref <18)
Troponin I (High Sensitivity): 853 ng/L (ref <18)

## 2020-04-25 LAB — BASIC METABOLIC PANEL
Anion gap: 10 (ref 5–15)
BUN: 14 mg/dL (ref 8–23)
CO2: 20 mmol/L — ABNORMAL LOW (ref 22–32)
Calcium: 8.1 mg/dL — ABNORMAL LOW (ref 8.9–10.3)
Chloride: 109 mmol/L (ref 98–111)
Creatinine, Ser: 1.69 mg/dL — ABNORMAL HIGH (ref 0.61–1.24)
GFR, Estimated: 40 mL/min — ABNORMAL LOW (ref >60)
Glucose, Bld: 141 mg/dL — ABNORMAL HIGH (ref 70–99)
Potassium: 3.3 mmol/L — ABNORMAL LOW (ref 3.5–5.1)
Sodium: 139 mmol/L (ref 135–145)

## 2020-04-25 LAB — CBC WITH DIFFERENTIAL/PLATELET
Abs Immature Granulocytes: 0.01 10*3/uL (ref 0.00–0.07)
Basophils Absolute: 0 10*3/uL (ref 0.0–0.1)
Basophils Relative: 1 %
Eosinophils Absolute: 0.1 10*3/uL (ref 0.0–0.5)
Eosinophils Relative: 3 %
HCT: 29 % — ABNORMAL LOW (ref 39.0–52.0)
Hemoglobin: 9.1 g/dL — ABNORMAL LOW (ref 13.0–17.0)
Immature Granulocytes: 0 %
Lymphocytes Relative: 34 %
Lymphs Abs: 1.4 10*3/uL (ref 0.7–4.0)
MCH: 25.3 pg — ABNORMAL LOW (ref 26.0–34.0)
MCHC: 31.4 g/dL (ref 30.0–36.0)
MCV: 80.8 fL (ref 80.0–100.0)
Monocytes Absolute: 0.3 10*3/uL (ref 0.1–1.0)
Monocytes Relative: 7 %
Neutro Abs: 2.2 10*3/uL (ref 1.7–7.7)
Neutrophils Relative %: 55 %
Platelets: 127 10*3/uL — ABNORMAL LOW (ref 150–400)
RBC: 3.59 MIL/uL — ABNORMAL LOW (ref 4.22–5.81)
RDW: 14.7 % (ref 11.5–15.5)
WBC: 4.1 10*3/uL (ref 4.0–10.5)
nRBC: 0 % (ref 0.0–0.2)

## 2020-04-25 LAB — SARS CORONAVIRUS 2 (TAT 6-24 HRS): SARS Coronavirus 2: NEGATIVE

## 2020-04-25 LAB — PROTIME-INR
INR: 1.2 (ref 0.8–1.2)
Prothrombin Time: 14.5 seconds (ref 11.4–15.2)

## 2020-04-25 LAB — HEMOGLOBIN A1C
Hgb A1c MFr Bld: 6.6 % — ABNORMAL HIGH (ref 4.8–5.6)
Mean Plasma Glucose: 142.72 mg/dL

## 2020-04-25 LAB — HEPARIN LEVEL (UNFRACTIONATED): Heparin Unfractionated: 0.58 IU/mL (ref 0.30–0.70)

## 2020-04-25 LAB — POC OCCULT BLOOD, ED: Fecal Occult Bld: NEGATIVE

## 2020-04-25 LAB — HEMOGLOBIN AND HEMATOCRIT, BLOOD
HCT: 37.3 % — ABNORMAL LOW (ref 39.0–52.0)
Hemoglobin: 12.1 g/dL — ABNORMAL LOW (ref 13.0–17.0)

## 2020-04-25 LAB — MAGNESIUM: Magnesium: 1.7 mg/dL (ref 1.7–2.4)

## 2020-04-25 MED ORDER — AMLODIPINE BESYLATE 5 MG PO TABS
5.0000 mg | ORAL_TABLET | Freq: Once | ORAL | Status: AC
  Administered 2020-04-25: 5 mg via ORAL
  Filled 2020-04-25: qty 1

## 2020-04-25 MED ORDER — CARVEDILOL 3.125 MG PO TABS: 6.2500 mg | ORAL_TABLET | Freq: Once | ORAL | Status: DC

## 2020-04-25 MED ORDER — HEPARIN (PORCINE) 25000 UT/250ML-% IV SOLN
1000.0000 [IU]/h | INTRAVENOUS | Status: DC
  Administered 2020-04-25 – 2020-04-26 (×2): 1000 [IU]/h via INTRAVENOUS
  Filled 2020-04-25 (×2): qty 250

## 2020-04-25 MED ORDER — CARVEDILOL 6.25 MG PO TABS
6.2500 mg | ORAL_TABLET | Freq: Two times a day (BID) | ORAL | Status: DC
  Administered 2020-04-25 – 2020-04-27 (×4): 6.25 mg via ORAL
  Filled 2020-04-25 (×4): qty 1

## 2020-04-25 MED ORDER — HYDRALAZINE HCL 20 MG/ML IJ SOLN
10.0000 mg | Freq: Four times a day (QID) | INTRAMUSCULAR | Status: DC | PRN
  Administered 2020-04-27: 10 mg via INTRAVENOUS
  Filled 2020-04-25: qty 1

## 2020-04-25 MED ORDER — HEPARIN BOLUS VIA INFUSION
4000.0000 [IU] | Freq: Once | INTRAVENOUS | Status: AC
  Administered 2020-04-25: 4000 [IU] via INTRAVENOUS
  Filled 2020-04-25: qty 4000

## 2020-04-25 MED ORDER — CARVEDILOL 3.125 MG PO TABS: 3.1250 mg | ORAL_TABLET | Freq: Two times a day (BID) | ORAL | Status: DC

## 2020-04-25 MED ORDER — ROSUVASTATIN CALCIUM 20 MG PO TABS
20.0000 mg | ORAL_TABLET | Freq: Every day | ORAL | Status: DC
  Administered 2020-04-25 – 2020-04-27 (×3): 20 mg via ORAL
  Filled 2020-04-25: qty 1
  Filled 2020-04-25: qty 4
  Filled 2020-04-25: qty 1

## 2020-04-25 MED ORDER — NITROGLYCERIN IN D5W 200-5 MCG/ML-% IV SOLN
0.0000 ug/min | INTRAVENOUS | Status: DC
  Administered 2020-04-25: 10 ug/min via INTRAVENOUS
  Administered 2020-04-25: 15 ug/min via INTRAVENOUS
  Administered 2020-04-25: 25 ug/min via INTRAVENOUS
  Administered 2020-04-25: 5 ug/min via INTRAVENOUS
  Administered 2020-04-25: 20 ug/min via INTRAVENOUS
  Filled 2020-04-25: qty 250

## 2020-04-25 MED ORDER — POTASSIUM CHLORIDE CRYS ER 20 MEQ PO TBCR
40.0000 meq | EXTENDED_RELEASE_TABLET | Freq: Once | ORAL | Status: AC
  Administered 2020-04-25: 40 meq via ORAL
  Filled 2020-04-25: qty 2

## 2020-04-25 MED ORDER — PARICALCITOL 1 MCG PO CAPS
1.0000 ug | ORAL_CAPSULE | Freq: Every day | ORAL | Status: DC
  Administered 2020-04-25 – 2020-04-27 (×3): 1 ug via ORAL
  Filled 2020-04-25 (×3): qty 1

## 2020-04-25 MED ORDER — ONDANSETRON HCL 4 MG/2ML IJ SOLN: 4.0000 mg | Freq: Four times a day (QID) | INTRAMUSCULAR | Status: DC | PRN

## 2020-04-25 MED ORDER — HYDRALAZINE HCL 20 MG/ML IJ SOLN
5.0000 mg | Freq: Four times a day (QID) | INTRAMUSCULAR | Status: DC | PRN
  Administered 2020-04-25: 5 mg via INTRAVENOUS
  Filled 2020-04-25: qty 1

## 2020-04-25 MED ORDER — AMLODIPINE BESYLATE 10 MG PO TABS
10.0000 mg | ORAL_TABLET | Freq: Every day | ORAL | Status: DC
  Administered 2020-04-26 – 2020-04-27 (×2): 10 mg via ORAL
  Filled 2020-04-25 (×2): qty 1

## 2020-04-25 MED ORDER — PANTOPRAZOLE SODIUM 40 MG PO TBEC
40.0000 mg | DELAYED_RELEASE_TABLET | Freq: Two times a day (BID) | ORAL | Status: DC
  Administered 2020-04-25 – 2020-04-27 (×4): 40 mg via ORAL
  Filled 2020-04-25 (×4): qty 1

## 2020-04-25 MED ORDER — ASPIRIN EC 81 MG PO TBEC
81.0000 mg | DELAYED_RELEASE_TABLET | Freq: Every day | ORAL | Status: DC
  Administered 2020-04-26 – 2020-04-27 (×2): 81 mg via ORAL
  Filled 2020-04-25 (×2): qty 1

## 2020-04-25 MED ORDER — CARVEDILOL 3.125 MG PO TABS
3.1250 mg | ORAL_TABLET | Freq: Once | ORAL | Status: AC
  Administered 2020-04-25: 3.125 mg via ORAL
  Filled 2020-04-25: qty 1

## 2020-04-25 MED ORDER — NITROGLYCERIN 2 % TD OINT: 0.5000 [in_us] | TOPICAL_OINTMENT | Freq: Four times a day (QID) | TRANSDERMAL | Status: DC

## 2020-04-25 MED ORDER — ACETAMINOPHEN 325 MG PO TABS: 650.0000 mg | ORAL_TABLET | ORAL | Status: DC | PRN

## 2020-04-25 NOTE — ED Triage Notes (Signed)
BIB EMS for CP started as discomfort @ 0900 today. Took a shower and pain got worse and turned into buring and pressure in center of chest. Initial BP by EMS was 230/120 manual.

## 2020-04-25 NOTE — ED Notes (Signed)
Patient transported to X-ray via stretcher 

## 2020-04-25 NOTE — ED Provider Notes (Signed)
[facility] EMERGENCY DEPARTMENT Provider Note   CSN: [id number] Arrival date & time: 04/25/20  1051     History Chief Complaint  Patient presents with  . Chest Pain    Juan Goodwin is a 85 y.o. male.  Presents ER with concern for chest pain.  Patient states that he was at home when he had an episode of chest pain, describes it as tightness, discomfort, up to 7 or 8 out of 10 in severity.  Nonradiating.  Not associated with exertion.  Resolved spontaneously, total episode lasted around an hour.  Prior to EMS arrival it started to ease off.  After receiving the nitro and aspirin by EMS, this completely resolved.  Additional history obtained from chart review, June 2021 had echo and stress which were negative.        Past Medical History:  Diagnosis Date  . Acute blood loss anemia 07/10/2014  . Acute kidney injury superimposed on chronic kidney disease (HCC) 04/10/2017  . Acute pyloric channel ulcer 07/13/2014  . Acute upper GI bleed 07/13/2014  . ARF (acute renal failure) (HCC) 07/13/2014  . Cerebrovascular disease 10/08/2018  . Chronic kidney disease (CKD), stage III (moderate) 03/30/2017  . Dehydration 04/10/2017  . Gastrointestinal hemorrhage with melena   . GI bleed 07/11/2014  . Global aphasia 03/27/2017  . Helicobacter pylori gastritis 07/13/2014  . High cholesterol   . Hypernatremia 07/13/2014  . Hypertension   . Hypokalemia 07/13/2014  . Hypotension 07/13/2014  . Melena 07/11/2014  . Normocytic normochromic anemia 04/10/2017  . Orthostatic hypotension 03/27/2017  . REM sleep behavior disorder 10/08/2018  . Renal insufficiency   . Subdural hematoma (HCC) 03/26/2017  . Thrombocytopenia (HCC) 07/13/2014  . Weakness 04/10/2017    Patient Active Problem List   Diagnosis Date Noted  . Hyperlipidemia 08/08/2019  . Cerebrovascular disease 10/08/2018  . REM sleep behavior disorder 10/08/2018  . Weakness 04/10/2017  . Dehydration 04/10/2017  . Acute kidney injury  superimposed on chronic kidney disease (HCC) 04/10/2017  . Normocytic normochromic anemia 04/10/2017  . Chronic kidney disease (CKD), stage III (moderate) (HCC) 03/30/2017  . Hypertension 03/29/2017  . Orthostatic hypotension 03/27/2017  . Global aphasia 03/27/2017  . Subdural hematoma (HCC) 03/26/2017  . Acute pyloric channel ulcer 07/13/2014  . Hypokalemia 07/13/2014  . Acute upper GI bleed 07/13/2014  . ARF (acute renal failure) (HCC) 07/13/2014  . Hypernatremia 07/13/2014  . Hypotension 07/13/2014  . Thrombocytopenia (HCC) 07/13/2014  . Helicobacter pylori gastritis 07/13/2014  . Gastrointestinal hemorrhage with melena   . Melena 07/11/2014  . GI bleed 07/11/2014  . Acute blood loss anemia 07/10/2014    Past Surgical History:  Procedure Laterality Date  . BRAIN SURGERY    . CRANIOTOMY Left 03/26/2017   Procedure: CRANIOTOMY HEMATOMA EVACUATION SUBDURAL;  Surgeon: Donalee Citrin, MD;  Location: [facility] OR;  Service: Neurosurgery;  Laterality: Left;  . ESOPHAGOGASTRODUODENOSCOPY N/A 07/11/2014   Procedure: ESOPHAGOGASTRODUODENOSCOPY (EGD);  Surgeon: Charlott Rakes, MD;  Location: [facility];  Service: Endoscopy;  Laterality: N/A;       Family History  Problem Relation Age of Onset  . Heart failure Mother     Social History   Tobacco Use  . Smoking status: Former Smoker    Packs/day: 1.00    Years: 35.00    Pack years: 35.00  . Smokeless tobacco: Never Used  Substance Use Topics  . Alcohol use: No  . Drug use: Not Currently    Home Medications Prior to Admission medications  Medication Sig Start Date End Date Taking? Authorizing Provider  amLODipine (NORVASC) 5 MG tablet Take 1 tablet (5 mg total) by mouth daily. 08/26/19   Quintella Reicherturner, Traci R, MD  carvedilol (COREG) 6.25 MG tablet Take 0.5 tablets (3.125 mg total) by mouth 2 (two) times daily. 08/08/19   Quintella Reicherturner, Traci R, MD  Multiple Vitamins-Minerals (MULTIVITAMIN ADULTS 50+ PO) Take 1 tablet by mouth daily.    [provider]  Omega-3 Fatty Acids (FISH OIL PO) Take 1 capsule by mouth daily.    [provider]  paricalcitol (ZEMPLAR) 1 MCG capsule Take 1 mcg by mouth daily. 05/26/14   [provider]  rosuvastatin (CRESTOR) 20 MG tablet Take 1 tablet (20 mg total) by mouth daily. 08/21/19   Quintella Reicherturner, Traci R, MD    Allergies    Patient has no known allergies.  Review of Systems   Review of Systems  Constitutional: Negative for chills and fever.  HENT: Negative for ear pain and sore throat.   Eyes: Negative for pain and visual disturbance.  Respiratory: Negative for cough and shortness of breath.   Cardiovascular: Positive for chest pain. Negative for palpitations.  Gastrointestinal: Negative for abdominal pain and vomiting.  Genitourinary: Negative for dysuria and hematuria.  Musculoskeletal: Negative for arthralgias and back pain.  Skin: Negative for color change and rash.  Neurological: Negative for seizures and syncope.  All other systems reviewed and are negative.   Physical Exam Updated Vital Signs BP (!) 193/118   Pulse 78   Temp 97.8 F (36.6 C) (Oral)   Resp (!) 21   Ht 6\' 2"  (1.88 m)   Wt 83.9 kg   SpO2 92%   BMI 23.75 kg/m   Physical Exam Vitals and nursing note reviewed.  Constitutional:      Appearance: He is well-developed and well-nourished.  HENT:     Head: Normocephalic and atraumatic.  Eyes:     Conjunctiva/sclera: Conjunctivae normal.  Cardiovascular:     Rate and Rhythm: Normal rate and regular rhythm.     Heart sounds: No murmur heard.   Pulmonary:     Effort: Pulmonary effort is normal. No respiratory distress.     Breath sounds: Normal breath sounds.  Chest:     Comments: Stool brown, tech chaperone Abdominal:     Palpations: Abdomen is soft.     Tenderness: There is no abdominal tenderness.  Musculoskeletal:        General: No edema.     Cervical back: Neck supple.  Skin:    General: Skin is warm and dry.     Capillary  Refill: Capillary refill takes less than 2 seconds.  Neurological:     General: No focal deficit present.     Mental Status: He is alert.  Psychiatric:        Mood and Affect: Mood and affect normal.     ED Results / Procedures / Treatments   Labs (all labs ordered are listed, but only abnormal results are displayed) Labs Reviewed  CBC WITH DIFFERENTIAL/PLATELET - Abnormal; Notable for the following components:      Result Value   RBC 3.59 (*)    Hemoglobin 9.1 (*)    HCT 29.0 (*)    MCH 25.3 (*)    Platelets 127 (*)    All other components within normal limits  BASIC METABOLIC PANEL - Abnormal; Notable for the following components:   Potassium 3.3 (*)    CO2 20 (*)  Glucose, Bld 141 (*)    Creatinine, Ser 1.69 (*)    Calcium 8.1 (*)    GFR, Estimated 40 (*)    All other components within normal limits  TROPONIN I (HIGH SENSITIVITY) - Abnormal; Notable for the following components:   Troponin I (High Sensitivity) 18 (*)    All other components within normal limits  TROPONIN I (HIGH SENSITIVITY) - Abnormal; Notable for the following components:   Troponin I (High Sensitivity) 72 (*)    All other components within normal limits  PROTIME-INR  POC OCCULT BLOOD, ED  TROPONIN I (HIGH SENSITIVITY)  TROPONIN I (HIGH SENSITIVITY)    EKG EKG Interpretation  Date/Time:  "Sunday April 25 2020 10:58:12 EST Ventricular Rate:  71 PR Interval:    QRS Duration: 108 QT Interval:  428 QTC Calculation: 466 R Axis:   -56 Text Interpretation: Sinus rhythm Left anterior fascicular block Abnormal R-wave progression, late transition Probable left ventricular hypertrophy Confirmed by Halle Davlin ([id number]) on 04/25/2020 11:01:43 AM   Radiology DG Chest 2 View  Result Date: 04/25/2020 CLINICAL DATA:  Chest pain. EXAM: CHEST - 2 VIEW COMPARISON:  Chest x-ray dated April 10, 2017. FINDINGS: The heart size and mediastinal contours are within normal limits. Both lungs are clear. The  visualized skeletal structures are unremarkable. IMPRESSION: No active cardiopulmonary disease. Electronically Signed   By: William T Derry M.D.   On: 04/25/2020 11:46    Procedures .Critical Care Performed by: Arzell Mcgeehan S, MD Authorized by: Bulmaro Feagans S, MD   Critical care provider statement:    Critical care time (minutes):  42   Critical care was necessary to treat or prevent imminent or life-threatening deterioration of the following conditions:  Cardiac failure   Critical care was time spent personally by me on the following activities:  Discussions with consultants, evaluation of patient's response to treatment, examination of patient, ordering and performing treatments and interventions, ordering and review of laboratory studies, ordering and review of radiographic studies, pulse oximetry, re-evaluation of patient's condition, obtaining history from patient or surrogate and review of old charts     Medications Ordered in ED Medications  amLODipine (NORVASC) tablet 5 mg (has no administration in time range)  carvedilol (COREG) tablet 3.125 mg (has no administration in time range)  heparin bolus via infusion 4,000 Units (has no administration in time range)  heparin ADULT infusion 100 units/mL (25000 units/250mL) (has no administration in time range)    ED Course  I have reviewed the triage vital signs and the nursing notes.  Pertinent labs & imaging results that were available during my care of the patient were reviewed by me and considered in my medical decision making (see chart for details).    MDM Rules/Calculators/A&P                         84"  year old male presenting to ER with concern for episode of chest pain.  No prior history of CAD, initial EKG without obvious ischemic change.  No pain on arrival and no ongoing pain while in ER.  Initial troponin was 18, second troponin 72.  Noted drop in hemoglobin, unsure of chronicity, Hemoccult negative.  Discussed  case with cardiology, recommended treating as possible ACS, starting heparin and admitted to the hospitalist service.  Will consult hospitalist for admission.  Final Clinical Impression(s) / ED Diagnoses Final diagnoses:  Chest pain, unspecified type  NSTEMI (non-ST elevated myocardial infarction) (HCC)  Elevated troponin  Rx / DC Orders ED Discharge Orders    None       Milagros Loll, MD 04/25/20 1443

## 2020-04-25 NOTE — ED Notes (Signed)
Date and time results received: 04/25/20 4:21 PM  Test: Troponin Critical Value: 853  Name of Provider Notified: Chipper Herb  Orders Received? Or Actions Taken?: Orders Received - See Orders for details and Actions Taken: heparin drip infusing. Cards MD at bedside and also aware.

## 2020-04-25 NOTE — H&P (Signed)
History and Physical    AYDRIAN Goodwin AVW:[id number] DOB: [birth year] DOA: 04/25/2020  PCP: Daisy Floro, MD (Confirm with patient/family/NH records and if not entered, this has to be entered at [facility] point of entry) Patient coming from: Home  I have personally briefly reviewed patient's old medical records in [facility]  Chief Complaint: Chest pain  HPI: Juan Goodwin is a 85 y.o. male with medical history significant of HTN, CKD stage IIIB, HLD, remote history of SDH and remote history of GI bleed 2nd to NSAIDS in 2016, presented with new onset of chest pain.  Patient has been in his usual health status, until morning, he woke up with sudden onset of burning-like chest discomfort and cardiac getting worse, centrally located, nonradiating, associated with feeling nausea.  After about an hour, chest pain subsided without any intervention.  EMS arrived and found patient blood pressure significant elevated systolic in the 230s. Patient reported he missed this morning's BP meds due to chest pain. He further received some compliant with blood pressure medication, he is able to recall the names and what time of the day he takes them. He lives by himself.  Patient had two episodes of dizziness last year and underwent stress test which showed low risk of CAD.  Patient was admitted at least 4+ weeks, he has been having increasing exertional dyspnea and feeling frequent fatigue.  But denied any cough, no orthopnea.  Denied any abdominal pain, NAD no dullness to any black tarry stool blood in the stool.  ED Course: Blood pressures remain elevated, EKG showed chronic ST-T changes. Trop 18>72.  Checks x-ray no acute infiltrates. Hb 9.1 compared to baseline >12.   Review of Systems: As per HPI otherwise 14 point review of systems negative.    Past Medical History:  Diagnosis Date  . Acute blood loss anemia 07/10/2014  . Acute kidney injury superimposed on chronic kidney disease (HCC) 04/10/2017   . Acute pyloric channel ulcer 07/13/2014  . Acute upper GI bleed 07/13/2014  . ARF (acute renal failure) (HCC) 07/13/2014  . Cerebrovascular disease 10/08/2018  . Chronic kidney disease (CKD), stage III (moderate) 03/30/2017  . Dehydration 04/10/2017  . Gastrointestinal hemorrhage with melena   . GI bleed 07/11/2014  . Global aphasia 03/27/2017  . Helicobacter pylori gastritis 07/13/2014  . High cholesterol   . Hypernatremia 07/13/2014  . Hypertension   . Hypokalemia 07/13/2014  . Hypotension 07/13/2014  . Melena 07/11/2014  . Normocytic normochromic anemia 04/10/2017  . Orthostatic hypotension 03/27/2017  . REM sleep behavior disorder 10/08/2018  . Renal insufficiency   . Subdural hematoma (HCC) 03/26/2017  . Thrombocytopenia (HCC) 07/13/2014  . Weakness 04/10/2017    Past Surgical History:  Procedure Laterality Date  . BRAIN SURGERY    . CRANIOTOMY Left 03/26/2017   Procedure: CRANIOTOMY HEMATOMA EVACUATION SUBDURAL;  Surgeon: Donalee Citrin, MD;  Location: [facility] OR;  Service: Neurosurgery;  Laterality: Left;  . ESOPHAGOGASTRODUODENOSCOPY N/A 07/11/2014   Procedure: ESOPHAGOGASTRODUODENOSCOPY (EGD);  Surgeon: Charlott Rakes, MD;  Location: [facility];  Service: Endoscopy;  Laterality: N/A;     reports that he has quit smoking. He has a 35.00 pack-year smoking history. He has never used smokeless tobacco. He reports previous drug use. He reports that he does not drink alcohol.  No Known Allergies  Family History  Problem Relation Age of Onset  . Heart failure Mother      Prior to Admission medications   Medication Sig Start Date End Date Taking?  Authorizing Provider  amLODipine (NORVASC) 5 MG tablet Take 1 tablet (5 mg total) by mouth daily. 08/26/19   Quintella Reichert, MD  carvedilol (COREG) 6.25 MG tablet Take 0.5 tablets (3.125 mg total) by mouth 2 (two) times daily. 08/08/19   Quintella Reichert, MD  Multiple Vitamins-Minerals (MULTIVITAMIN ADULTS 50+ PO) Take 1 tablet by mouth daily.    [provider]  Omega-3 Fatty Acids (FISH OIL PO) Take 1 capsule by mouth daily.    [provider]  paricalcitol (ZEMPLAR) 1 MCG capsule Take 1 mcg by mouth daily. 05/26/14   [provider]  rosuvastatin (CRESTOR) 20 MG tablet Take 1 tablet (20 mg total) by mouth daily. 08/21/19   Quintella Reichert, MD    Physical Exam: Vitals:   04/25/20 1415 04/25/20 1430 04/25/20 1500 04/25/20 1530  BP:  (!) 193/118 (!) 207/112 (!) 199/122  Pulse: 70 78 65 79  Resp: 15 (!) 21 20 (!) 26  Temp:      TempSrc:      SpO2: 100% 92% 100% 100%  Weight:      Height:        Constitutional: NAD, calm, comfortable Vitals:   04/25/20 1415 04/25/20 1430 04/25/20 1500 04/25/20 1530  BP:  (!) 193/118 (!) 207/112 (!) 199/122  Pulse: 70 78 65 79  Resp: 15 (!) 21 20 (!) 26  Temp:      TempSrc:      SpO2: 100% 92% 100% 100%  Weight:      Height:       Eyes: PERRL, lids and conjunctivae normal ENMT: Mucous membranes are moist. Posterior pharynx clear of any exudate or lesions.Normal dentition.  Neck: normal, supple, no masses, no thyromegaly Respiratory: clear to auscultation bilaterally, no wheezing, no crackles. Normal respiratory effort. No accessory muscle use.  Cardiovascular: Regular rate and rhythm, no murmurs / rubs / gallops. No extremity edema. 2+ pedal pulses. No carotid bruits.  Abdomen: no tenderness, no masses palpated. No hepatosplenomegaly. Bowel sounds positive.  Musculoskeletal: no clubbing / cyanosis. No joint deformity upper and lower extremities. Good ROM, no contractures. Normal muscle tone.  Skin: no rashes, lesions, ulcers. No induration Neurologic: CN 2-12 grossly intact. Sensation intact, DTR normal. Strength 5/5 in all 4.  Psychiatric: Normal judgment and insight. Alert and oriented x 3. Normal mood.    Labs on Admission: I have personally reviewed following labs and imaging studies  CBC: Recent Labs  Lab 04/25/20 1119  WBC 4.1  NEUTROABS 2.2  HGB 9.1*  HCT  29.0*  MCV 80.8  PLT 127*   Basic Metabolic Panel: Recent Labs  Lab 04/25/20 1119  NA 139  K 3.3*  CL 109  CO2 20*  GLUCOSE 141*  BUN 14  CREATININE 1.69*  CALCIUM 8.1*   GFR: Estimated Creatinine Clearance: 39.2 mL/min (A) (by C-G formula based on SCr of 1.69 mg/dL (H)). Liver Function Tests: No results for input(s): AST, ALT, ALKPHOS, BILITOT, PROT, ALBUMIN in the last 168 hours. No results for input(s): LIPASE, AMYLASE in the last 168 hours. No results for input(s): AMMONIA in the last 168 hours. Coagulation Profile: Recent Labs  Lab 04/25/20 1219  INR 1.2   Cardiac Enzymes: No results for input(s): CKTOTAL, CKMB, CKMBINDEX, TROPONINI in the last 168 hours. BNP (last 3 results) No results for input(s): PROBNP in the last 8760 hours. HbA1C: No results for input(s): HGBA1C in the last 72 hours. CBG: No results for input(s): GLUCAP in the last 168  hours. Lipid Profile: No results for input(s): CHOL, HDL, LDLCALC, TRIG, CHOLHDL, LDLDIRECT in the last 72 hours. Thyroid Function Tests: No results for input(s): TSH, T4TOTAL, FREET4, T3FREE, THYROIDAB in the last 72 hours. Anemia Panel: No results for input(s): VITAMINB12, FOLATE, FERRITIN, TIBC, IRON, RETICCTPCT in the last 72 hours. Urine analysis:    Component Value Date/Time   COLORURINE YELLOW 04/10/2017 1741   APPEARANCEUR CLEAR 04/10/2017 1741   LABSPEC 1.011 04/10/2017 1741   PHURINE 5.0 04/10/2017 1741   GLUCOSEU NEGATIVE 04/10/2017 1741   HGBUR NEGATIVE 04/10/2017 1741   BILIRUBINUR NEGATIVE 04/10/2017 1741   KETONESUR NEGATIVE 04/10/2017 1741   PROTEINUR NEGATIVE 04/10/2017 1741   UROBILINOGEN 0.2 07/11/2014 0827   NITRITE NEGATIVE 04/10/2017 1741   LEUKOCYTESUR NEGATIVE 04/10/2017 1741    Radiological Exams on Admission: DG Chest 2 View  Result Date: 04/25/2020 CLINICAL DATA:  Chest pain. EXAM: CHEST - 2 VIEW COMPARISON:  Chest x-ray dated April 10, 2017. FINDINGS: The heart size and mediastinal  contours are within normal limits. Both lungs are clear. The visualized skeletal structures are unremarkable. IMPRESSION: No active cardiopulmonary disease. Electronically Signed   By: Obie Dredge M.D.   On: 04/25/2020 11:46    EKG: Independently reviewed.  Chronic ST-T changes on 3 aVF and V5 and V6  Assessment/Plan Active Problems:   HTN (hypertension), malignant   Angina at rest [facility])  (please populate well all problems here in Problem List. (For example, if patient is on BP meds at home and you resume or decide to hold them, it is a problem that needs to be her. Same for CAD, COPD, HLD and so on)  Angina, likely from uncontrolled hypertension/hypertension emergency -Increase metoprolol to 10 mg daily-continue Coreg 6.25 twice daily -As needed hydralazine -Cardiology on board, and the starting heparin drip.  -He did have a normal stress test last year. -Echocardiogram -repeat Trop in AM  Anemia, chronic? -Denied any abdominal pain or bleeding or dark-colored stool.  But significant history of NSAID induced pyloric ulcer bleeding in 2016. -Recheck H&H tonight -Guaiac test negative in ED. -Send FOBT -Start PPI twice daily for now.  CKD stage III -Creatinine level stable, euvolemic -Stringent control BP.  Elevated glucose -No formal diagnosis of diabetes, check A1c   DVT prophylaxis: On heparin drip  code Status: Full Code Family Communication: Daughter over the phone Disposition Plan: Expect 1 to 2 days hospital stay to treat angina. Consults called: Cardiology Admission status: PCU obs   Emeline General MD [facility] Pager [phone]  04/25/2020, 3:39 PM

## 2020-04-25 NOTE — ED Notes (Signed)
To [phone]

## 2020-04-25 NOTE — Progress Notes (Signed)
Pt arrived to 4E from ED.  CHG completed.  Vital signs done.  A&Ox4.   Wieght 77.2 kg  Height   Patient belongs clothes, cell phone, hearing aids in room.    2 x wallets with cash and credit cards sent to security.  Copy placed in chart.

## 2020-04-25 NOTE — Progress Notes (Signed)
ANTICOAGULATION CONSULT NOTE - Initial Consult  Pharmacy Consult for heparin Indication: chest pain/ACS  No Known Allergies  Patient Measurements: Height: 6\' 2"  (188 cm) Weight: 83.9 kg (185 lb) IBW/kg (Calculated) : 82.2 Heparin Dosing Weight: 83.9 kg  Vital Signs: Temp: 97.8 F (36.6 C) (02/13 1101) Temp Source: Oral (02/13 1101) BP: 193/118 (02/13 1430) Pulse Rate: 78 (02/13 1430)  Labs: Recent Labs    04/25/20 1119 04/25/20 1219  HGB 9.1*  --   HCT 29.0*  --   PLT 127*  --   LABPROT  --  14.5  INR  --  1.2  CREATININE 1.69*  --   TROPONINIHS 18* 72*    Estimated Creatinine Clearance: 39.2 mL/min (A) (by C-G formula based on SCr of 1.69 mg/dL (H)).   Medical History: Past Medical History:  Diagnosis Date  . Acute blood loss anemia 07/10/2014  . Acute kidney injury superimposed on chronic kidney disease (HCC) 04/10/2017  . Acute pyloric channel ulcer 07/13/2014  . Acute upper GI bleed 07/13/2014  . ARF (acute renal failure) (HCC) 07/13/2014  . Cerebrovascular disease 10/08/2018  . Chronic kidney disease (CKD), stage III (moderate) 03/30/2017  . Dehydration 04/10/2017  . Gastrointestinal hemorrhage with melena   . GI bleed 07/11/2014  . Global aphasia 03/27/2017  . Helicobacter pylori gastritis 07/13/2014  . High cholesterol   . Hypernatremia 07/13/2014  . Hypertension   . Hypokalemia 07/13/2014  . Hypotension 07/13/2014  . Melena 07/11/2014  . Normocytic normochromic anemia 04/10/2017  . Orthostatic hypotension 03/27/2017  . REM sleep behavior disorder 10/08/2018  . Renal insufficiency   . Subdural hematoma (HCC) 03/26/2017  . Thrombocytopenia (HCC) 07/13/2014  . Weakness 04/10/2017    Medications:  Scheduled:  . amLODipine  5 mg Oral Once  . carvedilol  3.125 mg Oral Once  . heparin  4,000 Units Intravenous Once   Infusions:  . heparin      Assessment: 84 yom coming in with CP - no AC PTA.   Hgb 9.1, plt 127. Trop 72. No s/sx of bleeding or infusion issues.    Goal of Therapy:  Heparin level 0.3-0.7 units/ml Monitor platelets by anticoagulation protocol: Yes   Plan:  Give 4000 units bolus x 1 Start heparin infusion at 1000 units/hr Check anti-Xa level in 8 hours and daily while on heparin Continue to monitor H&H and platelets  04/12/2017, PharmD, BCCCP Clinical Pharmacist  Phone: [phone] 04/25/2020 2:47 PM  Please check AMION for all [facility] Pharmacy phone numbers After 10:00 PM, call Main Pharmacy [phone]

## 2020-04-25 NOTE — ED Notes (Signed)
Patient given [place] sandwich bag and ice water. Tolerating well

## 2020-04-25 NOTE — Consult Note (Signed)
Cardiology Consultation:   Patient ID: WENDEL HOMEYER MRN: [id number]; DOB: [birth year]  Admit date: 04/25/2020 Date of Consult: 04/25/2020  PCP:  Daisy Floro, MD   [facility]  Cardiologist:  Armanda Magic, MD  }   Patient Profile:   Juan Goodwin is a 85 y.o. male with a hx of CRI stage III,HL, and HTN who is being seen today for the evaluation of chest pain with elevated troponin at the request of Dr Chipper Herb.  History of Present Illness:   Juan Goodwin reports being in good health until this am, when he developed mild to moderate chest discomfort while getting ready for church.  He felt that his discomfort was worsening and therefore presented to [facility] for further evaluation.  He has been found to have hypertensive urgency.  He received slNTG with improvement in BP and subsequently his chest pain.  He remains chest pain free, though his BP has again elevated.  He has a h/o chest pain for which he was evaluated by Dr Mayford Knife and had a myoview performed.  This was low risk.  Typically, he is active without chest pain.  He has noticed worsening SOB for the past few weeks.  Presently, he is comfortable and without compliant.    Past Medical History:  Diagnosis Date  . Acute pyloric channel ulcer 07/13/2014  . Acute upper GI bleed 07/13/2014  . Cerebrovascular disease 10/08/2018  . Chronic kidney disease (CKD), stage III (moderate) (HCC) 03/30/2017  . Gastrointestinal hemorrhage with melena 06/2014  . Global aphasia 03/27/2017  . Helicobacter pylori gastritis 07/13/2014  . High cholesterol   . Hypernatremia 07/13/2014  . Hypertension   . Hypokalemia 07/13/2014  . Normocytic normochromic anemia 04/10/2017  . REM sleep behavior disorder 10/08/2018  . Subdural hematoma (HCC) 03/26/2017  . Thrombocytopenia (HCC) 07/13/2014    Past Surgical History:  Procedure Laterality Date  . BRAIN SURGERY    . CRANIOTOMY Left 03/26/2017   Procedure: CRANIOTOMY HEMATOMA  EVACUATION SUBDURAL;  Surgeon: Donalee Citrin, MD;  Location: [facility] OR;  Service: Neurosurgery;  Laterality: Left;  . ESOPHAGOGASTRODUODENOSCOPY N/A 07/11/2014   Procedure: ESOPHAGOGASTRODUODENOSCOPY (EGD);  Surgeon: Charlott Rakes, MD;  Location: [facility];  Service: Endoscopy;  Laterality: N/A;     Home Medications:  Prior to Admission medications   Medication Sig Start Date End Date Taking? Authorizing Provider  amLODipine (NORVASC) 5 MG tablet Take 1 tablet (5 mg total) by mouth daily. 08/26/19  Yes Turner, Cornelious Bryant, MD  carvedilol (COREG) 6.25 MG tablet Take 6.25 mg by mouth 2 (two) times daily. 08/08/19  Yes Turner, Cornelious Bryant, MD  Multiple Vitamins-Minerals (MULTIVITAMIN ADULTS 50+ PO) Take 1 tablet by mouth daily.   Yes [provider]  Omega-3 Fatty Acids (FISH OIL PO) Take 1 capsule by mouth daily.   Yes [provider]  paricalcitol (ZEMPLAR) 1 MCG capsule Take 1 mcg by mouth daily. 05/26/14  Yes [provider]  rosuvastatin (CRESTOR) 20 MG tablet Take 1 tablet (20 mg total) by mouth daily. Patient not taking: Reported on 04/25/2020 08/21/19   Quintella Reichert, MD    Inpatient Medications: Scheduled Meds: . [START ON 04/26/2020] amLODipine  10 mg Oral Daily  . aspirin EC  81 mg Oral Daily  . carvedilol  3.125 mg Oral BID  . nitroGLYCERIN  0.5 inch Topical Q6H  . pantoprazole  40 mg Oral BID AC  . paricalcitol  1 mcg Oral Daily  . rosuvastatin  20  mg Oral Daily   Continuous Infusions: . heparin 1,000 Units/hr (04/25/20 1505)   PRN Meds: acetaminophen, hydrALAZINE, ondansetron (ZOFRAN) IV  Allergies:    Allergies  Allergen Reactions  . Doxazosin Mesylate Other (See Comments)    Social History:   Social History   Socioeconomic History  . Marital status: Widowed    Spouse name: Not on file  . Number of children: 3  . Years of education: 73  . Highest education level: Not on file  Occupational History  . Not on file  Tobacco Use  . Smoking  status: Former Smoker    Packs/day: 1.00    Years: 35.00    Pack years: 35.00  . Smokeless tobacco: Never Used  Substance and Sexual Activity  . Alcohol use: No  . Drug use: Not Currently  . Sexual activity: Never  Other Topics Concern  . Not on file  Social History Narrative   Right handed    Occasional cup of coffee    Lives at home alone   Wife is deceased along with 2 children 1 child is living     Social Determinants of Corporate investment banker Strain: Not on file  Food Insecurity: Not on file  Transportation Needs: Not on file  Physical Activity: Not on file  Stress: Not on file  Social Connections: Not on file  Intimate Partner Violence: Not on file    Family History:    Family History  Problem Relation Age of Onset  . Heart failure Mother      ROS:  Please see the history of present illness.   All other ROS reviewed and negative.     Physical Exam/Data:   Vitals:   04/25/20 1430 04/25/20 1500 04/25/20 1530 04/25/20 1600  BP: (!) 193/118 (!) 207/112 (!) 199/122 (!) 200/119  Pulse: 78 65 79 77  Resp: (!) 21 20 (!) 26 19  Temp:      TempSrc:      SpO2: 92% 100% 100% 100%  Weight:      Height:       No intake or output data in the 24 hours ending 04/25/20 1632 Last 3 Weights 04/25/2020 08/21/2019 07/30/2019  Weight (lbs) 185 lb 184 lb 184 lb 3.2 oz  Weight (kg) 83.915 kg 83.462 kg 83.553 kg     Body mass index is 23.75 kg/m.  General:  Well nourished, well developed, in no acute distress HEENT: normal Neck: no JVD Cardiac:  RRR Lungs:  Normal WOB Abd: soft  Ext: no edema Musculoskeletal:  No deformities, BUE and BLE strength normal and equal Skin: warm and dry  Neuro:  CNs 2-12 intact, no focal abnormalities noted Psych:  Normal affect   EKG:  The EKG was personally reviewed and demonstrates:  Sinus, no acute changes Telemetry:  Telemetry was personally reviewed and demonstrates:  sinus  Relevant CV Studies: Prior myoview was low risk  08/21/19  Laboratory Data:  High Sensitivity Troponin:   Recent Labs  Lab 04/25/20 1119 04/25/20 1219 04/25/20 1500  TROPONINIHS 18* 72* 853*     Chemistry Recent Labs  Lab 04/25/20 1119  NA 139  K 3.3*  CL 109  CO2 20*  GLUCOSE 141*  BUN 14  CREATININE 1.69*  CALCIUM 8.1*  GFRNONAA 40*  ANIONGAP 10    No results for input(s): PROT, ALBUMIN, AST, ALT, ALKPHOS, BILITOT in the last 168 hours. Hematology Recent Labs  Lab 04/25/20 1119  WBC 4.1  RBC 3.59*  HGB  9.1*  HCT 29.0*  MCV 80.8  MCH 25.3*  MCHC 31.4  RDW 14.7  PLT 127*   Radiology/Studies:  DG Chest 2 View  Result Date: 04/25/2020 CLINICAL DATA:  Chest pain. EXAM: CHEST - 2 VIEW COMPARISON:  Chest x-ray dated April 10, 2017. FINDINGS: The heart size and mediastinal contours are within normal limits. Both lungs are clear. The visualized skeletal structures are unremarkable. IMPRESSION: No active cardiopulmonary disease. Electronically Signed   By: Obie Dredge M.D.   On: 04/25/2020 11:46    Assessment and Plan:   1. Hypertensive urgency with cardiorenal dysfunction The patient presents with chest pain in the setting of markedly elevated BP.  He did receive improvement in CP with sl NTG and improved BP.   Our initial approach will be to carefully lower his BP.  Once his BP has been managed, we will reassess his chest pain.  His HS troponin is elevated in the setting of renal failure and markedly elevated BP.  I am not convinced that he has had an acute thrombotic event but cannot at this time exclude NSTEMI.  Give renal failure, we will need to be cautious in considerations of cath. He has been started on ASA and heparin drip I will also start a nitroglycerine drip We will titrate his home medicines in addition Echo is pending to evaluate for WMA. Cardiology to reassess in the AM.  We will make NPO after midnight.  2. Stage IIIb renal failure As above  3. Elevated troponin As above  4. HL He is  on crestor.  Medical therapy has previously been limited by patient nonadherance (per 08/21/19 pharmacy note)   For questions or updates, please contact [facility] Please consult [email or web address] for contact info under    Signed, Hillis Range, MD  04/25/2020 4:32 PM

## 2020-04-25 NOTE — Progress Notes (Signed)
BP still significantly elevated after Coreg x2, Amlodipine x2 and IV hydralazine x1  Order nitropaste and re-evaluate in 2 hours.

## 2020-04-25 NOTE — Progress Notes (Signed)
Checked on patient and currently CP free and asx. On IV NG 25 mcg currently, sBP 140s. Please page overnight if recurrent CP.

## 2020-04-26 ENCOUNTER — Encounter (HOSPITAL_COMMUNITY): Payer: Self-pay | Admitting: Interventional Cardiology

## 2020-04-26 ENCOUNTER — Inpatient Hospital Stay (HOSPITAL_COMMUNITY): Payer: Medicare HMO

## 2020-04-26 ENCOUNTER — Other Ambulatory Visit (HOSPITAL_COMMUNITY): Payer: Medicare HMO

## 2020-04-26 ENCOUNTER — Inpatient Hospital Stay (HOSPITAL_COMMUNITY)
Admission: EM | Disposition: A | Discharge status: Home / Self Care | Payer: Self-pay | Source: Home / Self Care | Attending: Internal Medicine

## 2020-04-26 DIAGNOSIS — I214 Non-ST elevation (NSTEMI) myocardial infarction: Secondary | ICD-10-CM | POA: Diagnosis not present

## 2020-04-26 DIAGNOSIS — I259 Chronic ischemic heart disease, unspecified: Secondary | ICD-10-CM

## 2020-04-26 DIAGNOSIS — D649 Anemia, unspecified: Secondary | ICD-10-CM | POA: Diagnosis not present

## 2020-04-26 DIAGNOSIS — R079 Chest pain, unspecified: Secondary | ICD-10-CM | POA: Diagnosis not present

## 2020-04-26 DIAGNOSIS — I2510 Atherosclerotic heart disease of native coronary artery without angina pectoris: Secondary | ICD-10-CM | POA: Diagnosis not present

## 2020-04-26 DIAGNOSIS — E785 Hyperlipidemia, unspecified: Secondary | ICD-10-CM

## 2020-04-26 DIAGNOSIS — E876 Hypokalemia: Secondary | ICD-10-CM

## 2020-04-26 DIAGNOSIS — R778 Other specified abnormalities of plasma proteins: Secondary | ICD-10-CM

## 2020-04-26 DIAGNOSIS — I160 Hypertensive urgency: Secondary | ICD-10-CM | POA: Diagnosis not present

## 2020-04-26 HISTORY — PX: LEFT HEART CATH AND CORONARY ANGIOGRAPHY: CATH118249

## 2020-04-26 LAB — CBC
HCT: 35.4 % — ABNORMAL LOW (ref 39.0–52.0)
Hemoglobin: 11.5 g/dL — ABNORMAL LOW (ref 13.0–17.0)
MCH: 25.4 pg — ABNORMAL LOW (ref 26.0–34.0)
MCHC: 32.5 g/dL (ref 30.0–36.0)
MCV: 78.1 fL — ABNORMAL LOW (ref 80.0–100.0)
Platelets: 174 10*3/uL (ref 150–400)
RBC: 4.53 MIL/uL (ref 4.22–5.81)
RDW: 14.6 % (ref 11.5–15.5)
WBC: 5.3 10*3/uL (ref 4.0–10.5)
nRBC: 0 % (ref 0.0–0.2)

## 2020-04-26 LAB — IRON AND TIBC
Iron: 44 ug/dL — ABNORMAL LOW (ref 45–182)
Saturation Ratios: 17 % — ABNORMAL LOW (ref 17.9–39.5)
TIBC: 258 ug/dL (ref 250–450)
UIBC: 214 ug/dL

## 2020-04-26 LAB — POCT ACTIVATED CLOTTING TIME: Activated Clotting Time: 166 seconds

## 2020-04-26 LAB — BASIC METABOLIC PANEL
Anion gap: 10 (ref 5–15)
BUN: 15 mg/dL (ref 8–23)
CO2: 24 mmol/L (ref 22–32)
Calcium: 9.4 mg/dL (ref 8.9–10.3)
Chloride: 104 mmol/L (ref 98–111)
Creatinine, Ser: 1.61 mg/dL — ABNORMAL HIGH (ref 0.61–1.24)
GFR, Estimated: 42 mL/min — ABNORMAL LOW (ref >60)
Glucose, Bld: 114 mg/dL — ABNORMAL HIGH (ref 70–99)
Potassium: 4.7 mmol/L (ref 3.5–5.1)
Sodium: 138 mmol/L (ref 135–145)

## 2020-04-26 LAB — ECHOCARDIOGRAM COMPLETE
Area-P 1/2: 2.26 cm2
Calc EF: 68.3 %
Height: 74 in
S' Lateral: 2.6 cm
Single Plane A2C EF: 67.6 %
Single Plane A4C EF: 65 %
Weight: 2733.7 oz

## 2020-04-26 LAB — HEPARIN LEVEL (UNFRACTIONATED): Heparin Unfractionated: 0.54 IU/mL (ref 0.30–0.70)

## 2020-04-26 LAB — LIPID PANEL
Cholesterol: 142 mg/dL (ref 0–200)
HDL: 40 mg/dL — ABNORMAL LOW (ref >40)
LDL Cholesterol: 91 mg/dL (ref 0–99)
Total CHOL/HDL Ratio: 3.6 RATIO
Triglycerides: 53 mg/dL (ref <150)
VLDL: 11 mg/dL (ref 0–40)

## 2020-04-26 LAB — TROPONIN I (HIGH SENSITIVITY): Troponin I (High Sensitivity): 5085 ng/L (ref <18)

## 2020-04-26 LAB — FERRITIN: Ferritin: 42 ng/mL (ref 24–336)

## 2020-04-26 SURGERY — LEFT HEART CATH AND CORONARY ANGIOGRAPHY
Anesthesia: LOCAL

## 2020-04-26 MED ORDER — SODIUM CHLORIDE 0.9 % WEIGHT BASED INFUSION
3.0000 mL/kg/h | INTRAVENOUS | Status: DC
  Administered 2020-04-26: 3 mL/kg/h via INTRAVENOUS

## 2020-04-26 MED ORDER — VERAPAMIL HCL 2.5 MG/ML IV SOLN
INTRAVENOUS | Status: AC
  Filled 2020-04-26: qty 2

## 2020-04-26 MED ORDER — SODIUM CHLORIDE 0.9% FLUSH: 3.0000 mL | INTRAVENOUS | Status: DC | PRN

## 2020-04-26 MED ORDER — PERFLUTREN LIPID MICROSPHERE
1.0000 mL | INTRAVENOUS | Status: AC | PRN
  Administered 2020-04-26: 2 mL via INTRAVENOUS
  Filled 2020-04-26: qty 10

## 2020-04-26 MED ORDER — LABETALOL HCL 5 MG/ML IV SOLN: 10.0000 mg | INTRAVENOUS | Status: AC | PRN

## 2020-04-26 MED ORDER — NITROGLYCERIN 1 MG/10 ML FOR IR/CATH LAB
INTRA_ARTERIAL | Status: AC
  Filled 2020-04-26: qty 10

## 2020-04-26 MED ORDER — LIDOCAINE HCL (PF) 1 % IJ SOLN
INTRAMUSCULAR | Status: DC | PRN
  Administered 2020-04-26: 2 mL via SUBCUTANEOUS
  Administered 2020-04-26: 15 mL via SUBCUTANEOUS
  Administered 2020-04-26: 2 mL via SUBCUTANEOUS

## 2020-04-26 MED ORDER — MIDAZOLAM HCL 2 MG/2ML IJ SOLN
INTRAMUSCULAR | Status: DC | PRN
  Administered 2020-04-26: 1 mg via INTRAVENOUS

## 2020-04-26 MED ORDER — IOHEXOL 350 MG/ML SOLN
INTRAVENOUS | Status: DC | PRN
  Administered 2020-04-26: 40 mL via INTRA_ARTERIAL

## 2020-04-26 MED ORDER — SODIUM CHLORIDE 0.9 % IV SOLN: 250.0000 mL | INTRAVENOUS | Status: DC | PRN

## 2020-04-26 MED ORDER — LIDOCAINE HCL (PF) 1 % IJ SOLN
INTRAMUSCULAR | Status: AC
  Filled 2020-04-26: qty 30

## 2020-04-26 MED ORDER — ONDANSETRON HCL 4 MG/2ML IJ SOLN: 4.0000 mg | Freq: Four times a day (QID) | INTRAMUSCULAR | Status: DC | PRN

## 2020-04-26 MED ORDER — MAGNESIUM SULFATE 2 GM/50ML IV SOLN
2.0000 g | Freq: Once | INTRAVENOUS | Status: AC
  Administered 2020-04-26: 2 g via INTRAVENOUS
  Filled 2020-04-26: qty 50

## 2020-04-26 MED ORDER — SODIUM CHLORIDE 0.9% FLUSH
3.0000 mL | Freq: Two times a day (BID) | INTRAVENOUS | Status: DC
  Administered 2020-04-27: 3 mL via INTRAVENOUS

## 2020-04-26 MED ORDER — HEPARIN SODIUM (PORCINE) 1000 UNIT/ML IJ SOLN
INTRAMUSCULAR | Status: AC
  Filled 2020-04-26: qty 1

## 2020-04-26 MED ORDER — HYDRALAZINE HCL 20 MG/ML IJ SOLN: 10.0000 mg | INTRAMUSCULAR | Status: AC | PRN

## 2020-04-26 MED ORDER — FENTANYL CITRATE (PF) 100 MCG/2ML IJ SOLN
INTRAMUSCULAR | Status: AC
  Filled 2020-04-26: qty 2

## 2020-04-26 MED ORDER — HEPARIN (PORCINE) IN NACL 1000-0.9 UT/500ML-% IV SOLN
INTRAVENOUS | Status: AC
  Filled 2020-04-26: qty 1500

## 2020-04-26 MED ORDER — FENTANYL CITRATE (PF) 100 MCG/2ML IJ SOLN
INTRAMUSCULAR | Status: DC | PRN
  Administered 2020-04-26: 25 ug via INTRAVENOUS

## 2020-04-26 MED ORDER — HEPARIN (PORCINE) IN NACL 1000-0.9 UT/500ML-% IV SOLN
INTRAVENOUS | Status: DC | PRN
  Administered 2020-04-26 (×2): 500 mL

## 2020-04-26 MED ORDER — SODIUM CHLORIDE 0.9 % WEIGHT BASED INFUSION: 1.0000 mL/kg/h | INTRAVENOUS | Status: DC

## 2020-04-26 MED ORDER — ASPIRIN 81 MG PO CHEW: 81.0000 mg | CHEWABLE_TABLET | ORAL | Status: AC

## 2020-04-26 MED ORDER — MIDAZOLAM HCL 2 MG/2ML IJ SOLN
INTRAMUSCULAR | Status: AC
  Filled 2020-04-26: qty 2

## 2020-04-26 MED ORDER — ACETAMINOPHEN 325 MG PO TABS: 650.0000 mg | ORAL_TABLET | ORAL | Status: DC | PRN

## 2020-04-26 MED ORDER — SODIUM CHLORIDE 0.9 % IV SOLN
510.0000 mg | Freq: Once | INTRAVENOUS | Status: AC
  Administered 2020-04-26: 510 mg via INTRAVENOUS
  Filled 2020-04-26: qty 17

## 2020-04-26 MED ORDER — SODIUM CHLORIDE 0.9 % IV SOLN: INTRAVENOUS | Status: AC

## 2020-04-26 MED ORDER — SODIUM CHLORIDE 0.9% FLUSH: 3.0000 mL | Freq: Two times a day (BID) | INTRAVENOUS | Status: DC

## 2020-04-26 SURGICAL SUPPLY — 16 items
BAG SNAP BAND KOVER 36X36 (MISCELLANEOUS) ×2 IMPLANT
CATH 5FR JL3.5 JR4 ANG PIG MP (CATHETERS) ×2 IMPLANT
CATH INFINITI 5FR JL4 (CATHETERS) ×2 IMPLANT
COVER DOME SNAP 22 D (MISCELLANEOUS) ×2 IMPLANT
GLIDESHEATH SLEND SS 6F .021 (SHEATH) ×2 IMPLANT
GUIDEWIRE INQWIRE 1.5J.035X260 (WIRE) ×1 IMPLANT
INQWIRE 1.5J .035X260CM (WIRE) ×2
KIT HEART LEFT (KITS) ×2 IMPLANT
PACK CARDIAC CATHETERIZATION (CUSTOM PROCEDURE TRAY) ×2 IMPLANT
SHEATH PINNACLE 5F 10CM (SHEATH) ×2 IMPLANT
SHEATH PROBE COVER 6X72 (BAG) ×2 IMPLANT
SYR MEDRAD MARK 7 150ML (SYRINGE) ×2 IMPLANT
TRANSDUCER W/STOPCOCK (MISCELLANEOUS) ×2 IMPLANT
TUBING CIL FLEX 10 FLL-RA (TUBING) ×2 IMPLANT
WIRE EMERALD 3MM-J .035X150CM (WIRE) ×2 IMPLANT
WIRE HI TORQ VERSACORE-J 145CM (WIRE) ×2 IMPLANT

## 2020-04-26 NOTE — Progress Notes (Signed)
Site area: rt groin fa sheath Site Prior to Removal:  Level 0 Pressure Applied For: 20 minutes Manual:   yes Patient Status During Pull:  stable Post Pull Site:  Level 0 Post Pull Instructions Given:  yes Post Pull Pulses Present: rt pt palpable Dressing Applied:  Gauze and tegaderm Bedrest begins @ 1450 Comments:

## 2020-04-26 NOTE — Progress Notes (Signed)
ANTICOAGULATION CONSULT NOTE - Follow Up Consult  Pharmacy Consult for heparin Indication: chest pain/ACS  Allergies  Allergen Reactions  . Doxazosin Mesylate Other (See Comments)    Patient Measurements: Height: 6\' 2"  (188 cm) Weight: 77.5 kg (170 lb 13.7 oz) IBW/kg (Calculated) : 82.2 Heparin Dosing Weight: 83.9 kg  Vital Signs: Temp: 97.9 F (36.6 C) (02/14 0432) Temp Source: Oral (02/14 0432) BP: 120/83 (02/14 0432) Pulse Rate: 79 (02/14 0432)  Labs: Recent Labs    04/25/20 1119 04/25/20 1219 04/25/20 1500 04/25/20 2244 04/26/20 0058 04/26/20 0447  HGB 9.1*  --   --  12.1* 11.5*  --   HCT 29.0*  --   --  37.3* 35.4*  --   PLT 127*  --   --   --  174  --   LABPROT  --  14.5  --   --   --   --   INR  --  1.2  --   --   --   --   HEPARINUNFRC  --   --   --  0.58 0.54  --   CREATININE 1.69*  --   --   --   --   --   TROPONINIHS 18* 72* 853*  --   --  5,085*    Estimated Creatinine Clearance: 36.9 mL/min (A) (by C-G formula based on SCr of 1.69 mg/dL (H)).   Medical History: Past Medical History:  Diagnosis Date  . Acute pyloric channel ulcer 07/13/2014  . Acute upper GI bleed 07/13/2014  . Cerebrovascular disease 10/08/2018  . Chronic kidney disease (CKD), stage III (moderate) (HCC) 03/30/2017  . Gastrointestinal hemorrhage with melena 06/2014  . Global aphasia 03/27/2017  . Helicobacter pylori gastritis 07/13/2014  . High cholesterol   . Hypernatremia 07/13/2014  . Hypertension   . Hypokalemia 07/13/2014  . Normocytic normochromic anemia 04/10/2017  . REM sleep behavior disorder 10/08/2018  . Subdural hematoma (HCC) 03/26/2017  . Thrombocytopenia (HCC) 07/13/2014    Medications:  Scheduled:  . amLODipine  10 mg Oral Daily  . aspirin EC  81 mg Oral Daily  . carvedilol  6.25 mg Oral BID  . pantoprazole  40 mg Oral BID AC  . paricalcitol  1 mcg Oral Daily  . rosuvastatin  20 mg Oral Daily   Infusions:  . ferumoxytol    . heparin 1,000 Units/hr (04/25/20 1505)   . magnesium sulfate bolus IVPB    . nitroGLYCERIN 25 mcg/min (04/25/20 1719)    Assessment: 84 yom with  CP - no AC PTA.   Hgb 9.1, plt 127. Trop 72. No s/sx of bleeding  Heparin drip 1000 uts/hr Heparin level 0.54 at goal   Goal of Therapy:  Heparin level 0.3-0.7 units/ml Monitor platelets by anticoagulation protocol: Yes   Plan:  Heparin drip 1000 uts/hr Daily HL, CBC Monitor s/s bleeding     04/27/20 Pharm.D. CPP, BCPS Clinical Pharmacist [phone] 04/26/2020 8:59 AM    Please check AMION for all [facility] Pharmacy phone numbers After 10:00 PM, call Main Pharmacy [phone]

## 2020-04-26 NOTE — Progress Notes (Addendum)
Progress Note  Patient Name: Juan Goodwin Date of Encounter: 04/26/2020  [facility] Cardiologist: Armanda Magic, MD   Subjective   No chest pain, no SOB, feels better,  Hard of hearing, hearing aid in place.   Inpatient Medications    Scheduled Meds: . amLODipine  10 mg Oral Daily  . aspirin EC  81 mg Oral Daily  . carvedilol  6.25 mg Oral BID  . pantoprazole  40 mg Oral BID AC  . paricalcitol  1 mcg Oral Daily  . rosuvastatin  20 mg Oral Daily   Continuous Infusions: . heparin 1,000 Units/hr (04/25/20 1505)  . nitroGLYCERIN 25 mcg/min (04/25/20 1719)   PRN Meds: acetaminophen, hydrALAZINE, ondansetron (ZOFRAN) IV   Vital Signs    Vitals:   04/25/20 1800 04/25/20 1924 04/25/20 2242 04/26/20 0432  BP: (!) 149/93 (!) 154/97 (!) 154/94 120/83  Pulse: 90 79 86 79  Resp: 17 16 (!) 21 15  Temp:  98 F (36.7 C) 98.4 F (36.9 C) 97.9 F (36.6 C)  TempSrc:  Oral Oral Oral  SpO2: 100% 100% 100% 99%  Weight:      Height:        Intake/Output Summary (Last 24 hours) at 04/26/2020 0757 Last data filed at 04/25/2020 2200 Gross per 24 hour  Intake --  Output 400 ml  Net -400 ml   Last 3 Weights 04/25/2020 04/25/2020 08/21/2019  Weight (lbs) 170 lb 13.7 oz 185 lb 184 lb  Weight (kg) 77.5 kg 83.915 kg 83.462 kg      Telemetry    SR - Personally Reviewed  ECG    SR LAFB, mild ST depression lat and inf leads - Personally Reviewed  Physical Exam   GEN: No acute distress.   Neck: No JVD Cardiac: RRR, no murmurs, rubs, or gallops.  Respiratory: Clear to auscultation bilaterally. GI: Soft, nontender, non-distended  MS: No edema; No deformity. Neuro:  Nonfocal  Psych: Normal affect   Labs    High Sensitivity Troponin:   Recent Labs  Lab 04/25/20 1119 04/25/20 1219 04/25/20 1500 04/26/20 0447  TROPONINIHS 18* 72* 853* 5,085*      Chemistry Recent Labs  Lab 04/25/20 1119  NA 139  K 3.3*  CL 109  CO2 20*  GLUCOSE 141*  BUN 14  CREATININE 1.69*   CALCIUM 8.1*  GFRNONAA 40*  ANIONGAP 10     Hematology Recent Labs  Lab 04/25/20 1119 04/25/20 2244 04/26/20 0058  WBC 4.1  --  5.3  RBC 3.59*  --  4.53  HGB 9.1* 12.1* 11.5*  HCT 29.0* 37.3* 35.4*  MCV 80.8  --  78.1*  MCH 25.3*  --  25.4*  MCHC 31.4  --  32.5  RDW 14.7  --  14.6  PLT 127*  --  174    BNPNo results for input(s): BNP, PROBNP in the last 168 hours.   DDimer No results for input(s): DDIMER in the last 168 hours.   Radiology    DG Chest 2 View  Result Date: 04/25/2020 CLINICAL DATA:  Chest pain. EXAM: CHEST - 2 VIEW COMPARISON:  Chest x-ray dated April 10, 2017. FINDINGS: The heart size and mediastinal contours are within normal limits. Both lungs are clear. The visualized skeletal structures are unremarkable. IMPRESSION: No active cardiopulmonary disease. Electronically Signed   By: Obie Dredge M.D.   On: 04/25/2020 11:46    Cardiac Studies   Echo pending prior echo 08/2019 was normal.  Patient Profile  85 y.o. male with a hx of CRI stage III,HL, and HTN now admitted with chest pain and hypertensive urgency, neg nuc 08/2019.   Assessment & Plan    1. NSTEMI/Chest pain with elevated troponin, now 5,085 up from 18 on admit.  --on ASA and IV heparin, and NTG drip --NPO after MN most likely will need cardiac cath today --repeat EKG  --The patient understands that risks included but are not limited to stroke (1 in 1000), death (1 in 1000), kidney failure [usually temporary] (1 in 500), bleeding (1 in 200), allergic reaction [possibly serious] (1 in 200).   2.  Hypertensive urgency with cardiorenal dysfunction.  On NTG drip  --BP 120/83 on NTG, amlodipine now 10 up from 5 mg of home dose, coreg 6.25 BID same as home dose.   3.  HLD on crestor 20 will recheck on last eval LDL 156 may have been prior to crestor.  4. Anemia on admit with hgb 9.1 but follow up hgb 12.1 --Iron 44 TIBC 258, iron sat 17 UIBC 214 ferritin 42.  --FOB negative    5.   Hypokalemia replaced, BMP pending  Mg+ 1.7  6.  A1C 6.6.         For questions or updates, please contact [facility] Please consult [email or web address] for contact info under        Signed, Nada Boozer, NP  04/26/2020, 7:57 AM    Patient seen and examined with Nada Boozer NP.  Agree as above, with the following exceptions and changes as noted below. Feels well overall without current chest pain or SOB. Undergoing echo. Gen: NAD, CV: RRR, no murmurs, Lungs: clear, Abd: soft, Extrem: Warm, well perfused, no edema, Neuro/Psych: alert and oriented x 3, normal mood and affect. All available labs, radiology testing, previous records reviewed. Grossly preserved wall motion and LVEF on limited bedside review of echo images with sonographer imaging in realtime. Await formal echo read. For cath today given elevated troponin. If no obstructive CAD, would presume related to HTN. Consent for cath as above. Continue current meds.  Parke Poisson, MD 04/26/20 9:44 AM

## 2020-04-26 NOTE — Progress Notes (Signed)
Nitro gtt d/c'd per order

## 2020-04-26 NOTE — Interval H&P Note (Signed)
Cath Lab Visit (complete for each Cath Lab visit)  Clinical Evaluation Leading to the Procedure:   ACS: Yes.    Non-ACS:    Anginal Classification: CCS IV  Anti-ischemic medical therapy: Minimal Therapy (1 class of medications)  Non-Invasive Test Results: No non-invasive testing performed  Prior CABG: No previous CABG      History and Physical Interval Note:  04/26/2020 1:35 PM  Juan Goodwin  has presented today for surgery, with the diagnosis of nstemi.  The various methods of treatment have been discussed with the patient and family. After consideration of risks, benefits and other options for treatment, the patient has consented to  Procedure(s): LEFT HEART CATH AND CORONARY ANGIOGRAPHY (N/A) as a surgical intervention.  The patient's history has been reviewed, patient examined, no change in status, stable for surgery.  I have reviewed the patient's chart and labs.  Questions were answered to the patient's satisfaction.     Lance Muss

## 2020-04-26 NOTE — H&P (View-Only) (Signed)
 Progress Note  Patient Name: Juan Goodwin Date of Encounter: 04/26/2020  [facility] Cardiologist: Traci Turner, MD   Subjective   No chest pain, no SOB, feels better,  Hard of hearing, hearing aid in place.   Inpatient Medications    Scheduled Meds: . amLODipine  10 mg Oral Daily  . aspirin EC  81 mg Oral Daily  . carvedilol  6.25 mg Oral BID  . pantoprazole  40 mg Oral BID AC  . paricalcitol  1 mcg Oral Daily  . rosuvastatin  20 mg Oral Daily   Continuous Infusions: . heparin 1,000 Units/hr (04/25/20 1505)  . nitroGLYCERIN 25 mcg/min (04/25/20 1719)   PRN Meds: acetaminophen, hydrALAZINE, ondansetron (ZOFRAN) IV   Vital Signs    Vitals:   04/25/20 1800 04/25/20 1924 04/25/20 2242 04/26/20 0432  BP: (!) 149/93 (!) 154/97 (!) 154/94 120/83  Pulse: 90 79 86 79  Resp: 17 16 (!) 21 15  Temp:  98 F (36.7 C) 98.4 F (36.9 C) 97.9 F (36.6 C)  TempSrc:  Oral Oral Oral  SpO2: 100% 100% 100% 99%  Weight:      Height:        Intake/Output Summary (Last 24 hours) at 04/26/2020 0757 Last data filed at 04/25/2020 2200 Gross per 24 hour  Intake --  Output 400 ml  Net -400 ml   Last 3 Weights 04/25/2020 04/25/2020 08/21/2019  Weight (lbs) 170 lb 13.7 oz 185 lb 184 lb  Weight (kg) 77.5 kg 83.915 kg 83.462 kg      Telemetry    SR - Personally Reviewed  ECG    SR LAFB, mild ST depression lat and inf leads - Personally Reviewed  Physical Exam   GEN: No acute distress.   Neck: No JVD Cardiac: RRR, no murmurs, rubs, or gallops.  Respiratory: Clear to auscultation bilaterally. GI: Soft, nontender, non-distended  MS: No edema; No deformity. Neuro:  Nonfocal  Psych: Normal affect   Labs    High Sensitivity Troponin:   Recent Labs  Lab 04/25/20 1119 04/25/20 1219 04/25/20 1500 04/26/20 0447  TROPONINIHS 18* 72* 853* 5,085*      Chemistry Recent Labs  Lab 04/25/20 1119  NA 139  K 3.3*  CL 109  CO2 20*  GLUCOSE 141*  BUN 14  CREATININE 1.69*   CALCIUM 8.1*  GFRNONAA 40*  ANIONGAP 10     Hematology Recent Labs  Lab 04/25/20 1119 04/25/20 2244 04/26/20 0058  WBC 4.1  --  5.3  RBC 3.59*  --  4.53  HGB 9.1* 12.1* 11.5*  HCT 29.0* 37.3* 35.4*  MCV 80.8  --  78.1*  MCH 25.3*  --  25.4*  MCHC 31.4  --  32.5  RDW 14.7  --  14.6  PLT 127*  --  174    BNPNo results for input(s): BNP, PROBNP in the last 168 hours.   DDimer No results for input(s): DDIMER in the last 168 hours.   Radiology    DG Chest 2 View  Result Date: 04/25/2020 CLINICAL DATA:  Chest pain. EXAM: CHEST - 2 VIEW COMPARISON:  Chest x-ray dated April 10, 2017. FINDINGS: The heart size and mediastinal contours are within normal limits. Both lungs are clear. The visualized skeletal structures are unremarkable. IMPRESSION: No active cardiopulmonary disease. Electronically Signed   By: William T Derry M.D.   On: 04/25/2020 11:46    Cardiac Studies   Echo pending prior echo 08/2019 was normal.  Patient Profile       85 y.o. male with a hx of CRI stage III,HL, and HTN now admitted with chest pain and hypertensive urgency, neg nuc 08/2019.   Assessment & Plan    1. NSTEMI/Chest pain with elevated troponin, now 5,085 up from 18 on admit.  --on ASA and IV heparin, and NTG drip --NPO after MN most likely will need cardiac cath today --repeat EKG  --The patient understands that risks included but are not limited to stroke (1 in 1000), death (1 in 1000), kidney failure [usually temporary] (1 in 500), bleeding (1 in 200), allergic reaction [possibly serious] (1 in 200).   2.  Hypertensive urgency with cardiorenal dysfunction.  On NTG drip  --BP 120/83 on NTG, amlodipine now 10 up from 5 mg of home dose, coreg 6.25 BID same as home dose.   3.  HLD on crestor 20 will recheck on last eval LDL 156 may have been prior to crestor.  4. Anemia on admit with hgb 9.1 but follow up hgb 12.1 --Iron 44 TIBC 258, iron sat 17 UIBC 214 ferritin 42.  --FOB negative    5.   Hypokalemia replaced, BMP pending  Mg+ 1.7  6.  A1C 6.6.         For questions or updates, please contact [facility] Please consult [email or web address] for contact info under        Signed, Nada Boozer, NP  04/26/2020, 7:57 AM    Patient seen and examined with Nada Boozer NP.  Agree as above, with the following exceptions and changes as noted below. Feels well overall without current chest pain or SOB. Undergoing echo. Gen: NAD, CV: RRR, no murmurs, Lungs: clear, Abd: soft, Extrem: Warm, well perfused, no edema, Neuro/Psych: alert and oriented x 3, normal mood and affect. All available labs, radiology testing, previous records reviewed. Grossly preserved wall motion and LVEF on limited bedside review of echo images with sonographer imaging in realtime. Await formal echo read. For cath today given elevated troponin. If no obstructive CAD, would presume related to HTN. Consent for cath as above. Continue current meds.  Parke Poisson, MD 04/26/20 9:44 AM

## 2020-04-26 NOTE — Progress Notes (Signed)
Removed bilateral hearing aids and necklace. Pt had case for hearing aids and I placed white case inside bedroom slipper.  Placed cross necklace in specimen cup inside other bedroom slipper.  Slippers in white pt belongings bag. Pt aware. Pt resting with call bell within reach.  Will continue to monitor. Wiegand Hoff, RN

## 2020-04-26 NOTE — Progress Notes (Signed)
ANTICOAGULATION CONSULT NOTE  Pharmacy Consult for heparin Indication: chest pain/ACS  Allergies  Allergen Reactions  . Doxazosin Mesylate Other (See Comments)    Patient Measurements: Height: 6\' 2"  (188 cm) Weight: 77.5 kg (170 lb 13.7 oz) IBW/kg (Calculated) : 82.2 Heparin Dosing Weight: 83.9 kg  Vital Signs: Temp: 98.4 F (36.9 C) (02/13 2242) Temp Source: Oral (02/13 2242) BP: 154/94 (02/13 2242) Pulse Rate: 86 (02/13 2242)  Labs: Recent Labs    04/25/20 1119 04/25/20 1219 04/25/20 1500 04/25/20 2244  HGB 9.1*  --   --  12.1*  HCT 29.0*  --   --  37.3*  PLT 127*  --   --   --   LABPROT  --  14.5  --   --   INR  --  1.2  --   --   HEPARINUNFRC  --   --   --  0.58  CREATININE 1.69*  --   --   --   TROPONINIHS 18* 72* 853*  --     Estimated Creatinine Clearance: 36.9 mL/min (A) (by C-G formula based on SCr of 1.69 mg/dL (H)).  Assessment: 85 y.o. male with chest pain for heparin   Goal of Therapy:  Heparin level 0.3-0.7 units/ml Monitor platelets by anticoagulation protocol: Yes   Plan:  Continue Heparin at current rate Follow-up am labs.  97, PharmD, BCPS

## 2020-04-26 NOTE — Progress Notes (Signed)
  Echocardiogram 2D Echocardiogram with definity has been performed.  Leta Jungling M 04/26/2020, 10:02 AM

## 2020-04-26 NOTE — Progress Notes (Signed)
PROGRESS NOTE    Juan Goodwin  GEX:[id number] DOB: [birth year] DOA: 04/25/2020 PCP: Daisy Floro, MD   Chief Complain: Chest pain  Brief Narrative:  Patient is 85 year old male with history of hypertension, CKD stage IIIb, hyperlipidemia, remote history of SDH and GI bleed secondary to NSAIDs in 2016 who presented with chest pain from home.  On presentation he was severely hypertensive.  He was also complaining of exertional dyspnea, fatigue.  On presentation, EKG showed ST-T changes.  Troponins elevated.  Chest x-ray did not show pneumonia.  Cardiology consulted.  Started on heparin drip for suspicion of NSTEMI.Plna for cardiac cath today.  Assessment & Plan:   Active Problems:   HTN (hypertension), malignant   Chest pain   NSTEMI (non-ST elevated myocardial infarction) (HCC)   NSTEMI: Complaining of chest pain presentation.  Currently chest pain resolved.  Continue nitroglycerin for chest pain as needed.  On heparin drip.  Cardiology following.  Continue beta-blockers.  Echocardiogram showed ejection fraction of 65 to 70%, grade 1 diastolic dysfunction.  Troponin has hiked up.  EKG showed ST-T  on presentation Plan for cardiac cath today.  Hypertensive urgency: Started on nitroglycerin drip because of persistent severe hypertension.  Currently blood pressure stable.  Continue current medications.  Will consider stopping nitroglycerin drip  Normocytic anemia: Baseline hemoglobin around 12.  Currently hemoglobin is stable.  FOBT negative.  Iron studies showed mild iron deficiency, s/p a dose of IV infusion.  He has history of GI bleed secondary to NSAIDs in 2016.  CKD stage IIIb: Currently kidney function stable at baseline  Hyperglycemia: Hemoglobin A1c of 6.6.  Will put on Metformin on discharge.  Continue sliding scale insulin.  Monitor blood sugars  Hyperlipidemia: On Crestor  Debility/deconditioning: Patient lives alone.  He is hard on hearing.  Will request for physical  therapy evaluation tomorrow         DVT prophylaxis:Heparin Code Status: Full Family Communication: daughter on phone on 04/26/20 Status is: Inpatient  Remains inpatient appropriate because:Inpatient level of care appropriate due to severity of illness   Dispo: The patient is from: Home              Anticipated d/c is to: Home              Anticipated d/c date is: 1 day              Patient currently is not medically stable to d/c.   Difficult to place patient No    Consultants: Cardiology  Procedures:None  Antimicrobials:  Anti-infectives (From admission, onward)   None      Subjective: Patient seen and examined the bedside this morning.  Hemodynamically stable.  Denies any chest pain.  Objective: Vitals:   04/25/20 1800 04/25/20 1924 04/25/20 2242 04/26/20 0432  BP: (!) 149/93 (!) 154/97 (!) 154/94 120/83  Pulse: 90 79 86 79  Resp: 17 16 (!) 21 15  Temp:  98 F (36.7 C) 98.4 F (36.9 C) 97.9 F (36.6 C)  TempSrc:  Oral Oral Oral  SpO2: 100% 100% 100% 99%  Weight:      Height:        Intake/Output Summary (Last 24 hours) at 04/26/2020 0755 Last data filed at 04/25/2020 2200 Gross per 24 hour  Intake -  Output 400 ml  Net -400 ml   Filed Weights   04/25/20 1400 04/25/20 1728  Weight: 83.9 kg 77.5 kg    Examination:  General exam: Pleasant elderly male, comfortable  HEENT: Hearing aids Respiratory system: Bilateral equal air entry, normal vesicular breath sounds, no wheezes or crackles  Cardiovascular system: S1 & S2 heard, RRR. No JVD, murmurs, rubs, gallops or clicks. No pedal edema. Gastrointestinal system: Abdomen is nondistended, soft and nontender. No organomegaly or masses felt. Normal bowel sounds heard. Central nervous system: Alert and oriented. No focal neurological deficits. Extremities: No edema, no clubbing ,no cyanosis Skin: No rashes, lesions or ulcers,no icterus ,no pallor   Data Reviewed: I have personally reviewed following  labs and imaging studies  CBC: Recent Labs  Lab 04/25/20 1119 04/25/20 2244 04/26/20 0058  WBC 4.1  --  5.3  NEUTROABS 2.2  --   --   HGB 9.1* 12.1* 11.5*  HCT 29.0* 37.3* 35.4*  MCV 80.8  --  78.1*  PLT 127*  --  174   Basic Metabolic Panel: Recent Labs  Lab 04/25/20 1119 04/25/20 2244  NA 139  --   K 3.3*  --   CL 109  --   CO2 20*  --   GLUCOSE 141*  --   BUN 14  --   CREATININE 1.69*  --   CALCIUM 8.1*  --   MG  --  1.7   GFR: Estimated Creatinine Clearance: 36.9 mL/min (A) (by C-G formula based on SCr of 1.69 mg/dL (H)). Liver Function Tests: No results for input(s): AST, ALT, ALKPHOS, BILITOT, PROT, ALBUMIN in the last 168 hours. No results for input(s): LIPASE, AMYLASE in the last 168 hours. No results for input(s): AMMONIA in the last 168 hours. Coagulation Profile: Recent Labs  Lab 04/25/20 1219  INR 1.2   Cardiac Enzymes: No results for input(s): CKTOTAL, CKMB, CKMBINDEX, TROPONINI in the last 168 hours. BNP (last 3 results) No results for input(s): PROBNP in the last 8760 hours. HbA1C: Recent Labs    04/25/20 2244  HGBA1C 6.6*   CBG: No results for input(s): GLUCAP in the last 168 hours. Lipid Profile: No results for input(s): CHOL, HDL, LDLCALC, TRIG, CHOLHDL, LDLDIRECT in the last 72 hours. Thyroid Function Tests: No results for input(s): TSH, T4TOTAL, FREET4, T3FREE, THYROIDAB in the last 72 hours. Anemia Panel: Recent Labs    04/25/20 2244  FERRITIN 42  TIBC 258  IRON 44*   Sepsis Labs: No results for input(s): PROCALCITON, LATICACIDVEN in the last 168 hours.  Recent Results (from the past 240 hour(s))  SARS CORONAVIRUS 2 (TAT 6-24 HRS) Nasopharyngeal Nasopharyngeal Swab     Status: None   Collection Time: 04/25/20  3:37 PM   Specimen: Nasopharyngeal Swab  Result Value Ref Range Status   SARS Coronavirus 2 NEGATIVE NEGATIVE Final    Comment: (NOTE) SARS-CoV-2 target nucleic acids are NOT DETECTED.  The SARS-CoV-2 RNA is  generally detectable in upper and lower respiratory specimens during the acute phase of infection. Negative results do not preclude SARS-CoV-2 infection, do not rule out co-infections with other pathogens, and should not be used as the sole basis for treatment or other patient management decisions. Negative results must be combined with clinical observations, patient history, and epidemiological information. The expected result is Negative.  Fact Sheet for Patients: HairSlick.no  Fact Sheet for Healthcare Providers: quierodirigir.com  This test is not yet approved or cleared by the [place] FDA and  has been authorized for detection and/or diagnosis of SARS-CoV-2 by FDA under an Emergency Use Authorization (EUA). This EUA will remain  in effect (meaning this test can be used) for the duration of the COVID-19 declaration  under Se ction 564(b)(1) of the Act, 21 U.S.C. section 360bbb-3(b)(1), unless the authorization is terminated or revoked sooner.  Performed at [facility], [address] [place], [place] [address]          Radiology Studies: DG Chest 2 View  Result Date: 04/25/2020 CLINICAL DATA:  Chest pain. EXAM: CHEST - 2 VIEW COMPARISON:  Chest x-ray dated April 10, 2017. FINDINGS: The heart size and mediastinal contours are within normal limits. Both lungs are clear. The visualized skeletal structures are unremarkable. IMPRESSION: No active cardiopulmonary disease. Electronically Signed   By: Obie Dredge M.D.   On: 04/25/2020 11:46        Scheduled Meds: . amLODipine  10 mg Oral Daily  . aspirin EC  81 mg Oral Daily  . carvedilol  6.25 mg Oral BID  . pantoprazole  40 mg Oral BID AC  . paricalcitol  1 mcg Oral Daily  . rosuvastatin  20 mg Oral Daily   Continuous Infusions: . heparin 1,000 Units/hr (04/25/20 1505)  . nitroGLYCERIN 25 mcg/min (04/25/20 1719)     LOS: 1 day    Time  spent:25 mins. More than 50% of that time was spent in counseling and/or coordination of care.      Burnadette Pop, MD [facility] P2/14/2022, 7:55 AM

## 2020-04-26 NOTE — Progress Notes (Addendum)
Pt transferred from Cath lab. Right groin assessed and is a level 0. Pt educated on bed rest til 1850. VSS. Will continue to monitor    Everlean Cherry, RN

## 2020-04-27 DIAGNOSIS — E785 Hyperlipidemia, unspecified: Secondary | ICD-10-CM | POA: Diagnosis not present

## 2020-04-27 DIAGNOSIS — I259 Chronic ischemic heart disease, unspecified: Secondary | ICD-10-CM | POA: Diagnosis not present

## 2020-04-27 DIAGNOSIS — I160 Hypertensive urgency: Secondary | ICD-10-CM | POA: Diagnosis not present

## 2020-04-27 DIAGNOSIS — D649 Anemia, unspecified: Secondary | ICD-10-CM | POA: Diagnosis not present

## 2020-04-27 DIAGNOSIS — I214 Non-ST elevation (NSTEMI) myocardial infarction: Secondary | ICD-10-CM | POA: Diagnosis not present

## 2020-04-27 LAB — CBC
HCT: 33.6 % — ABNORMAL LOW (ref 39.0–52.0)
Hemoglobin: 11.5 g/dL — ABNORMAL LOW (ref 13.0–17.0)
MCH: 26.1 pg (ref 26.0–34.0)
MCHC: 34.2 g/dL (ref 30.0–36.0)
MCV: 76.2 fL — ABNORMAL LOW (ref 80.0–100.0)
Platelets: 164 10*3/uL (ref 150–400)
RBC: 4.41 MIL/uL (ref 4.22–5.81)
RDW: 14.7 % (ref 11.5–15.5)
WBC: 5.2 10*3/uL (ref 4.0–10.5)
nRBC: 0 % (ref 0.0–0.2)

## 2020-04-27 LAB — BASIC METABOLIC PANEL
Anion gap: 8 (ref 5–15)
BUN: 16 mg/dL (ref 8–23)
CO2: 22 mmol/L (ref 22–32)
Calcium: 9.1 mg/dL (ref 8.9–10.3)
Chloride: 105 mmol/L (ref 98–111)
Creatinine, Ser: 1.52 mg/dL — ABNORMAL HIGH (ref 0.61–1.24)
GFR, Estimated: 45 mL/min — ABNORMAL LOW (ref >60)
Glucose, Bld: 110 mg/dL — ABNORMAL HIGH (ref 70–99)
Potassium: 3.8 mmol/L (ref 3.5–5.1)
Sodium: 135 mmol/L (ref 135–145)

## 2020-04-27 MED ORDER — AMLODIPINE BESYLATE 10 MG PO TABS: 10.0000 mg | ORAL_TABLET | Freq: Every day | ORAL | 1 refills | Status: DC

## 2020-04-27 MED ORDER — ASPIRIN 81 MG PO TBEC: 81.0000 mg | DELAYED_RELEASE_TABLET | Freq: Every day | ORAL | 1 refills | Status: DC

## 2020-04-27 MED ORDER — CARVEDILOL 6.25 MG PO TABS: 6.2500 mg | ORAL_TABLET | Freq: Two times a day (BID) | ORAL | 1 refills | Status: DC

## 2020-04-27 MED ORDER — AMLODIPINE BESYLATE 5 MG PO TABS: 10.0000 mg | ORAL_TABLET | Freq: Every day | ORAL | 3 refills | Status: DC

## 2020-04-27 MED ORDER — ROSUVASTATIN CALCIUM 40 MG PO TABS: 40.0000 mg | ORAL_TABLET | Freq: Every day | ORAL | 1 refills | Status: DC

## 2020-04-27 MED ORDER — ROSUVASTATIN CALCIUM 20 MG PO TABS: 40.0000 mg | ORAL_TABLET | Freq: Every day | ORAL | Status: DC

## 2020-04-27 MED FILL — Verapamil HCl IV Soln 2.5 MG/ML: INTRAVENOUS | Qty: 2 | Status: AC

## 2020-04-27 MED FILL — Heparin Sodium (Porcine) Inj 1000 Unit/ML: INTRAMUSCULAR | Qty: 10 | Status: CN

## 2020-04-27 MED FILL — Heparin Sodium (Porcine) Inj 1000 Unit/ML: INTRAMUSCULAR | Qty: 10 | Status: AC

## 2020-04-27 NOTE — Evaluation (Signed)
Physical Therapy Evaluation Patient Details Name: Juan Goodwin MRN: [id number] DOB: [birth year] Today's Date: 04/27/2020   History of Present Illness  85 y.o. male with a hx of CRI stage III,HL, and HTN now admitted 04/25/20 with chest pain and hypertensive urgency NSTEMI/Chest pain with elevated troponin. s/p L heart cath and angiography 04/26/20  Clinical Impression  Patient evaluated by Physical Therapy with no further acute PT needs identified. All education has been completed and the patient has no further questions. Pt is supervision or better with mobility and has no follow-up Physical Therapy or equipment needs. PT is signing off. Thank you for this referral.     Follow Up Recommendations No PT follow up;Supervision - Intermittent    Equipment Recommendations  None recommended by PT    Recommendations for Other Services       Precautions / Restrictions Precautions Precautions: None Restrictions Weight Bearing Restrictions: No      Mobility  Bed Mobility Overal bed mobility: Independent             General bed mobility comments: flattened bed    Transfers Overall transfer level: Modified independent               General transfer comment: good power up and self steadying at [facility]  Ambulation/Gait Ambulation/Gait assistance: Supervision Gait Distance (Feet): 450 Feet Assistive device: None Gait Pattern/deviations: Step-through pattern;WFL(Within Functional Limits) Gait velocity: slowed Gait velocity interpretation: <1.8 ft/sec, indicate of risk for recurrent falls General Gait Details: supervision for safety, slow, steady gait, good navigation around obstacles in hallway        Balance Overall balance assessment: Mild deficits observed, not formally tested                                           Pertinent Vitals/Pain Pain Assessment: No/denies pain    Home Living Family/patient expects to be discharged to:: Private  residence Living Arrangements: Alone Available Help at Discharge: Family;Available PRN/intermittently Type of Home: House Home Access: Level entry     Home Layout: One level Home Equipment: None Additional Comments: does not match prior home set up in system    Prior Function Level of Independence: Independent               Hand Dominance   Dominant Hand: Right    Extremity/Trunk Assessment   Upper Extremity Assessment Upper Extremity Assessment: Defer to OT evaluation    Lower Extremity Assessment Lower Extremity Assessment: Overall WFL for tasks assessed       Communication   Communication: HOH  Cognition Arousal/Alertness: Awake/alert Behavior During Therapy: Flat affect Overall Cognitive Status: Within Functional Limits for tasks assessed                                        General Comments General comments (skin integrity, edema, etc.): VSS on RA        Assessment/Plan    PT Assessment Patent does not need any further PT services         PT Goals (Current goals can be found in the Care Plan section)  Acute Rehab PT Goals Patient Stated Goal: go home PT Goal Formulation: With patient Time For Goal Achievement: 05/11/20     AM-PAC PT "6 Clicks" Mobility  Outcome Measure  Help needed turning from your back to your side while in a flat bed without using bedrails?: None Help needed moving from lying on your back to sitting on the side of a flat bed without using bedrails?: None Help needed moving to and from a bed to a chair (including a wheelchair)?: None Help needed standing up from a chair using your arms (e.g., wheelchair or bedside chair)?: None Help needed to walk in hospital room?: None Help needed climbing 3-5 steps with a railing? : A Little 6 Click Score: 23    End of Session   Activity Tolerance: Patient tolerated treatment well Patient left: in bed;with call bell/phone within reach;with bed alarm set Nurse  Communication: Mobility status PT Visit Diagnosis: Muscle weakness (generalized) (M62.81)    Time: 3606-7703 PT Time Calculation (min) (ACUTE ONLY): 18 min   Charges:   PT Evaluation $PT Eval Moderate Complexity: 1 Mod          Jazmyne Beauchesne B. Beverely Risen PT, DPT Acute Rehabilitation Services Pager [phone] Office [phone]   Elon Alas [facility] 04/27/2020, 9:53 AM

## 2020-04-27 NOTE — Plan of Care (Signed)
  Problem: Clinical Measurements: Goal: Will remain free from infection Outcome: Progressing Goal: Diagnostic test results will improve Outcome: Progressing Goal: Respiratory complications will improve Outcome: Progressing Goal: Cardiovascular complication will be avoided Outcome: Progressing   

## 2020-04-27 NOTE — Progress Notes (Addendum)
Progress Note  Patient Name: Juan MantleJimmy L Goodwin Date of Encounter: 04/27/2020  [facility] Cardiologist: Armanda Magicraci Turner, MD   Subjective   Girlfriend is present, she and pt say meds skipped at times  Inpatient Medications    Scheduled Meds: . amLODipine  10 mg Oral Daily  . aspirin EC  81 mg Oral Daily  . carvedilol  6.25 mg Oral BID  . pantoprazole  40 mg Oral BID AC  . paricalcitol  1 mcg Oral Daily  . rosuvastatin  20 mg Oral Daily  . sodium chloride flush  3 mL Intravenous Q12H   Continuous Infusions: . sodium chloride     PRN Meds: sodium chloride, acetaminophen, hydrALAZINE, ondansetron (ZOFRAN) IV, sodium chloride flush   Vital Signs    Vitals:   04/27/20 0015 04/27/20 0454 04/27/20 0828 04/27/20 1155  BP: (!) 160/101 126/83 131/86 119/78  Pulse: 72 87 79 79  Resp: 18 19 20 18   Temp:  98.5 F (36.9 C) 97.9 F (36.6 C) 98.2 F (36.8 C)  TempSrc:  Oral Oral Oral  SpO2: 98% 99% 100% 100%  Weight:      Height:        Intake/Output Summary (Last 24 hours) at 04/27/2020 1228 Last data filed at 04/27/2020 [id number] Gross per 24 hour  Intake 70.58 ml  Output 1300 ml  Net -1229.42 ml   Last 3 Weights 04/25/2020 04/25/2020 08/21/2019  Weight (lbs) 170 lb 13.7 oz 185 lb 184 lb  Weight (kg) 77.5 kg 83.915 kg 83.462 kg      Telemetry    SR, - Personally Reviewed  ECG    None today - Personally Reviewed  Physical Exam   General: Well developed, well nourished, male in no acute distress Head: Eyes PERRLA, Head normocephalic and atraumatic Lungs: clear bilaterally to auscultation. Heart: HRRR S1 S2, without rub or gallop. No murmur. 4/4 extremity pulses are 2+ & equal. No JVD. Abdomen: Bowel sounds are present, abdomen soft and non-tender without masses or  hernias noted. Msk: Normal strength and tone for age. Extremities: No clubbing, cyanosis or edema.    Skin:  No rashes or lesions noted. Neuro: Alert and oriented X 3. Psych:  Good affect, responds  appropriately   Labs    High Sensitivity Troponin:   Recent Labs  Lab 04/25/20 1119 04/25/20 1219 04/25/20 1500 04/26/20 0447  TROPONINIHS 18* 72* 853* 5,085*      Chemistry Recent Labs  Lab 04/25/20 1119 04/26/20 0802 04/27/20 0043  NA 139 138 135  K 3.3* 4.7 3.8  CL 109 104 105  CO2 20* 24 22  GLUCOSE 141* 114* 110*  BUN 14 15 16   CREATININE 1.69* 1.61* 1.52*  CALCIUM 8.1* 9.4 9.1  GFRNONAA 40* 42* 45*  ANIONGAP 10 10 8      Hematology Recent Labs  Lab 04/25/20 1119 04/25/20 2244 04/26/20 0058 04/27/20 0043  WBC 4.1  --  5.3 5.2  RBC 3.59*  --  4.53 4.41  HGB 9.1* 12.1* 11.5* 11.5*  HCT 29.0* 37.3* 35.4* 33.6*  MCV 80.8  --  78.1* 76.2*  MCH 25.3*  --  25.4* 26.1  MCHC 31.4  --  32.5 34.2  RDW 14.7  --  14.6 14.7  PLT 127*  --  174 164   Lab Results  Component Value Date   CHOL 142 04/26/2020   HDL 40 (L) 04/26/2020   LDLCALC 91 04/26/2020   TRIG 53 04/26/2020   CHOLHDL 3.6 04/26/2020  BNPNo results for input(s): BNP, PROBNP in the last 168 hours.   DDimer No results for input(s): DDIMER in the last 168 hours.   Radiology    CARDIAC CATHETERIZATION  Addendum Date: 04/26/2020    RPDA lesion is 75% stenosed.  1st Mrg lesion is 75% stenosed.  2nd Mrg lesion is 99% stenosed.  These three lesions are in small branch vessels.  1st Diag lesion is 50% stenosed.  LV end diastolic pressure is normal.  There is no aortic valve stenosis.  Diffuse small vessel disease in the distal PDA and small OM branches.  Main vessels are patent. I suspect that he had demand ischemia from HTN in the setting of small vessel disease.   Given that he has been pain free for some time, we opted for medical therapy. Continue medical therapy and BP control.   Addendum Date: 04/26/2020    RPDA lesion is 75% stenosed.  1st Mrg lesion is 75% stenosed.  2nd Mrg lesion is 99% stenosed.  1st Diag lesion is 50% stenosed.  LV end diastolic pressure is normal.  There is no  aortic valve stenosis.  These three lesions are in small branch vessels.  Diffuse small vessel disease in the distal PDA and small OM branches.  Main vessels are patent. I suspect that he had demand ischemia from HTN in the setting of small vessel disease.   Given that he has been pain free for some time, we opted for medical therapy. Continue medical therapy and BP control.   Result Date: 04/26/2020  RPDA lesion is 75% stenosed.  1st Mrg lesion is 75% stenosed.  2nd Mrg lesion is 99% stenosed.  1st Diag lesion is 50% stenosed.  LV end diastolic pressure is normal.  There is no aortic valve stenosis.  Diffuse small vessel disease in the distal PDA and small OM branches.  Main vessels are patent. I suspect that he had demand ischemia from HTN in the setting of small vessel disease. Continue medical therapy and BP control.   ECHOCARDIOGRAM COMPLETE  Result Date: 04/26/2020    ECHOCARDIOGRAM REPORT   Patient Name:   Juan Goodwin Date of Exam: 04/26/2020 Medical Rec #:  [id number]      Height:       74.0 in Accession #:    [id number]     Weight:       170.9 lb Date of Birth:  1935-12-27      BSA:          2.033 m Patient Age:    85 years       BP:           142/92 mmHg Patient Gender: M              HR:           79 bpm. Exam Location:  Inpatient Procedure: 2D Echo, Intracardiac Opacification Agent, Color Doppler and Limited            Color Doppler Indications:    Chest Pain R07.9  History:        Patient has prior history of Echocardiogram examinations, most                 recent 08/21/2019. CAD and NSTEMI; Risk Factors:Hypertension and                 Dyslipidemia.  Sonographer:    Leta Jungling RDCS Referring Phys: [id number] Emeline General  Sonographer Comments: Global longitudinal strain was  attempted. IMPRESSIONS  1. Left ventricular ejection fraction, by estimation, is 65 to 70%. The left ventricle has normal function. The left ventricle has no regional wall motion abnormalities. There is mild left  ventricular hypertrophy. Left ventricular diastolic parameters are consistent with Grade I diastolic dysfunction (impaired relaxation).  2. Right ventricular systolic function is mildly reduced. The right ventricular size is normal. Tricuspid regurgitation signal is inadequate for assessing PA pressure.  3. The mitral valve is normal in structure. No evidence of mitral valve regurgitation. No evidence of mitral stenosis.  4. The aortic valve is grossly normal. There is mild calcification of the aortic valve. Aortic valve regurgitation is not visualized. No aortic stenosis is present.  5. Aortic dilatation noted. There is mild dilatation of the aortic root, measuring 42 mm. FINDINGS  Left Ventricle: Left ventricular ejection fraction, by estimation, is 65 to 70%. The left ventricle has normal function. The left ventricle has no regional wall motion abnormalities. Global longitudinal strain performed but not reported based on interpreter judgement due to suboptimal tracking. The left ventricular internal cavity size was normal in size. There is mild left ventricular hypertrophy. Left ventricular diastolic parameters are consistent with Grade I diastolic dysfunction (impaired relaxation). Right Ventricle: The right ventricular size is normal. No increase in right ventricular wall thickness. Right ventricular systolic function is mildly reduced. Tricuspid regurgitation signal is inadequate for assessing PA pressure. Left Atrium: Left atrial size was normal in size. Right Atrium: Right atrial size was normal in size. Pericardium: There is no evidence of pericardial effusion. Mitral Valve: The mitral valve is normal in structure. No evidence of mitral valve regurgitation. No evidence of mitral valve stenosis. Tricuspid Valve: The tricuspid valve is normal in structure. Tricuspid valve regurgitation is not demonstrated. No evidence of tricuspid stenosis. Aortic Valve: The aortic valve is grossly normal. There is mild  calcification of the aortic valve. Aortic valve regurgitation is not visualized. No aortic stenosis is present. Pulmonic Valve: The pulmonic valve was not well visualized. Pulmonic valve regurgitation is not visualized. No evidence of pulmonic stenosis. Aorta: Aortic dilatation noted and the ascending aorta was not well visualized. There is mild dilatation of the aortic root, measuring 42 mm. Venous: The inferior vena cava was not well visualized. IAS/Shunts: The interatrial septum was not well visualized.  LEFT VENTRICLE PLAX 2D LVIDd:         3.50 cm     Diastology LVIDs:         2.60 cm     LV e' medial:    3.26 cm/s LV PW:         1.20 cm     LV E/e' medial:  15.1 LV IVS:        1.30 cm     LV e' lateral:   6.85 cm/s LVOT diam:     2.30 cm     LV E/e' lateral: 7.2 LV SV:         62 LV SV Index:   31 LVOT Area:     4.15 cm  LV Volumes (MOD) LV vol d, MOD A2C: 39.2 ml LV vol d, MOD A4C: 50.3 ml LV vol s, MOD A2C: 12.7 ml LV vol s, MOD A4C: 17.6 ml LV SV MOD A2C:     26.5 ml LV SV MOD A4C:     50.3 ml LV SV MOD BP:      32.2 ml RIGHT VENTRICLE RV S prime:     10.30 cm/s TAPSE (M-mode): 1.7  cm LEFT ATRIUM             Index       RIGHT ATRIUM           Index LA diam:        2.70 cm 1.33 cm/m  RA Area:     10.90 cm LA Vol (A2C):   34.2 ml 16.82 ml/m RA Volume:   23.90 ml  11.76 ml/m LA Vol (A4C):   38.0 ml 18.69 ml/m LA Biplane Vol: 39.4 ml 19.38 ml/m  AORTIC VALVE LVOT Vmax:   86.20 cm/s LVOT Vmean:  60.900 cm/s LVOT VTI:    0.150 m  AORTA Ao Root diam: 4.20 cm MITRAL VALVE MV Area (PHT): 2.26 cm    SHUNTS MV Decel Time: 336 msec    Systemic VTI:  0.15 m MV E velocity: 49.30 cm/s  Systemic Diam: 2.30 cm MV A velocity: 89.10 cm/s MV E/A ratio:  0.55 Weston Brass MD Electronically signed by Weston Brass MD Signature Date/Time: 04/26/2020/10:25:18 AM    Final     Cardiac Studies   Echo and cath results above  Patient Profile     85 y.o. male with a hx of CRI stage III,HL, and HTN now admitted with  chest pain and hypertensive urgency, neg nuc 08/2019.   Assessment & Plan    1. NSTEMI - s/p cath w/ diffuse small vessel dz, med rx - trop elevation felt 2nd demand ischemia from HTN - However, still has CAD, needs ASA, BB, high-dose statin - EF normal by echo  2.  Hypertensive urgency with cardiorenal dysfunction. - peak BP 207/112 - BP control good on current rx - amlodipine increased, otherwise on home doses of med - encourage compliance and follow  3.  HLD  - pta on Crestor 20 mg qd -  LDL 91, still not at goal - increase Crestor to 40 mg qd  4. Anemia on admit with hgb 9.1 but follow up hgb 12.1 - iron ok, no source bleeding - per IM   5.  Hypokalemia  - supp and ok but trending down - not on diuretic - needs recheck at f/u visit  6.  A1C 6.6.   - per IM, f/u with PCP      For questions or updates, please contact [facility] Please consult [email or web address] for contact info under        Signed, Theodore Demark, PA-C  04/27/2020, 12:28 PM    Patient seen and examined with Theodore Demark PA-C.  Agree as above, with the following exceptions and changes as noted below. Patient is asymptomatic after cath. Blood pressure better controlled today. Admits to medication inconsistencies and he will do better with this. His girlfriend is at the bedside and will help. Gen: NAD, CV: RRR, no murmurs, Lungs: clear, Abd: soft, Extrem: Warm, well perfused, no edema, Neuro/Psych: alert and oriented x 3, normal mood and affect. All available labs, radiology testing, previous records reviewed. Patient is extremely hard of hearing but I was able to confirm with him that he understands what medications he will take and the doses will be written out clearly in his AVS for dismissal. We will arrange follow-up with hypertension clinic who he follows with in our office. All recommendations have been reviewed thoroughly with internal medicine who agrees and plans to discharge the patient  today.  Parke Poisson, MD 04/27/20 1:53 PM

## 2020-04-27 NOTE — Progress Notes (Signed)
Discharge instructions provided to patient. Medication changes and follow-up appointments reviewed. All questions answered. IVs removed. Patient to be escorted home by his significant other.   Allegra Grana RN

## 2020-04-27 NOTE — Evaluation (Signed)
Occupational Therapy Evaluation Patient Details Name: Juan Goodwin MRN: [id number] DOB: 16-Sep-1935 Today's Date: 04/27/2020    History of Present Illness 85 y.o. male with a hx of CRI stage III,HL, and HTN now admitted 04/25/20 with chest pain and hypertensive urgency NSTEMI/Chest pain with elevated troponin. s/p L heart cath and angiography 04/26/20   Clinical Impression   PTA, pt was living alone and was independent. Pt currently performing ADLs and functional mobility at Supervision-Min guard A level. Pt presenting with slight deficits in balance and strength. VSS on RA. Pt performing functional mobiliiy in hallway to simulated home distance with Supervision-Min Guard A. Pt would benefit from further acute OT to facilitate safe dc. Recommend dc to home once medically stable per physician.     Follow Up Recommendations  No OT follow up;Supervision - Intermittent    Equipment Recommendations  Tub/shower seat    Recommendations for Other Services PT consult     Precautions / Restrictions Precautions Precautions: None Restrictions Weight Bearing Restrictions: No      Mobility Bed Mobility Overal bed mobility: Independent             General bed mobility comments: flattened bed    Transfers Overall transfer level: Needs assistance   Transfers: Sit to/from Stand Sit to Stand: Supervision         General transfer comment: good power up and self steadying at [facility]    Balance Overall balance assessment: Mild deficits observed, not formally tested                                         ADL either performed or assessed with clinical judgement   ADL Overall ADL's : Needs assistance/impaired Eating/Feeding: Set up;Sitting   Grooming: Min guard;Standing   Upper Body Bathing: Supervision/ safety;Sitting   Lower Body Bathing: Min guard;Sit to/from stand   Upper Body Dressing : Supervision/safety;Sitting   Lower Body Dressing: Min guard;Sit  to/from stand   Toilet Transfer: Ambulation;Supervision/safety (simulated in room)           Functional mobility during ADLs: Min guard General ADL Comments: Pt presenting with balance and strength deficits but presenting possibly close to baseline.     Vision         Perception     Praxis      Pertinent Vitals/Pain Pain Assessment: No/denies pain     Hand Dominance Right   Extremity/Trunk Assessment Upper Extremity Assessment Upper Extremity Assessment: Overall WFL for tasks assessed   Lower Extremity Assessment Lower Extremity Assessment: Defer to PT evaluation   Cervical / Trunk Assessment Cervical / Trunk Assessment: Normal   Communication Communication Communication: HOH   Cognition Arousal/Alertness: Awake/alert Behavior During Therapy: Flat affect Overall Cognitive Status: Within Functional Limits for tasks assessed                                     General Comments  VSS on RA    Exercises     Shoulder Instructions      Home Living Family/patient expects to be discharged to:: Private residence Living Arrangements: Alone Available Help at Discharge: Family;Available PRN/intermittently Type of Home: House Home Access: Level entry     Home Layout: One level     Bathroom Shower/Tub: Chief Strategy Officer: Standard  Home Equipment: None   Additional Comments: Reports he has a daughter who lives in [place]      Prior Functioning/Environment Level of Independence: Independent        Comments: Reports he was performing ADLs, IADLs, and driving.        OT Problem List: Decreased strength;Decreased activity tolerance;Impaired balance (sitting and/or standing);Decreased knowledge of precautions      OT Treatment/Interventions: Self-care/ADL training;Therapeutic exercise;Energy conservation;DME and/or AE instruction;Therapeutic activities;Patient/family education    OT Goals(Current goals can be found  in the care plan section) Acute Rehab OT Goals Patient Stated Goal: go home OT Goal Formulation: With patient Time For Goal Achievement: 05/11/20 Potential to Achieve Goals: Good  OT Frequency: Min 2X/week   Barriers to D/C:            Co-evaluation              AM-PAC OT "6 Clicks" Daily Activity     Outcome Measure Help from another person eating meals?: None Help from another person taking care of personal grooming?: A Little Help from another person toileting, which includes using toliet, bedpan, or urinal?: A Little Help from another person bathing (including washing, rinsing, drying)?: A Little Help from another person to put on and taking off regular upper body clothing?: A Little Help from another person to put on and taking off regular lower body clothing?: A Little 6 Click Score: 19   End of Session Equipment Utilized During Treatment: Gait belt Nurse Communication: Mobility status  Activity Tolerance: Patient tolerated treatment well Patient left: in bed;with call bell/phone within reach  OT Visit Diagnosis: Unsteadiness on feet (R26.81);Other abnormalities of gait and mobility (R26.89);Muscle weakness (generalized) (M62.81)                Time: 0932-3557 OT Time Calculation (min): 15 min Charges:  OT General Charges $OT Visit: 1 Visit OT Evaluation $OT Eval Low Complexity: 1 Low  [name] MSOT, OTR/L Acute Rehab Pager: [phone] Office: [phone]  [name] 04/27/2020, 12:59 PM

## 2020-04-27 NOTE — Discharge Summary (Signed)
Physician Discharge Summary  DOAK MAH ZOX:[id number] DOB: 1935/05/24 DOA: 04/25/2020  PCP: Daisy Floro, MD  Admit date: 04/25/2020 Discharge date: 04/27/2020  Admitted From: Home Disposition:  Home  Discharge Condition:Stable CODE STATUS:FULL Diet recommendation: Heart Healthy   Brief/Interim Summary:  Patient is 85 year old male with history of hypertension, CKD stage IIIb, hyperlipidemia, remote history of SDH and GI bleed secondary to NSAIDs in 2016 who presented with chest pain from home.  On presentation he was severely hypertensive.  He was also complaining of exertional dyspnea, fatigue.  On presentation, EKG showed ST-T changes.  Troponins elevated.  Chest x-ray did not show pneumonia.  Cardiology consulted.  Started on heparin drip for suspicion of NSTEMI.Underwent cardiac cath on 04/26/2020 with finding of diffuse small vessel disease in the distal PDA and a small OM branches, recommended medical management.  Cardiology cleared him for discharge today.  Following problems were addressed during his hospitalization:  NSTEMI: Complaining of chest pain presentation.  EKG showed ST-T  on presentation.  Currently chest pain resolved.  He was started on heparin drip.  Cardiology were following.    Echocardiogram showed ejection fraction of 65 to 70%, grade 1 diastolic dysfunction. Underwent cardiac cath on 04/26/2020 with finding of diffuse small vessel disease in the distal PDA and a small OM branches, recommended medical management.Started on aspirin  Hypertensive urgency: Started on nitroglycerin drip because of persistent severe hypertension.  Currently blood pressure stable.  Continue current medications.    Normocytic anemia: Baseline hemoglobin around 12.  Currently hemoglobin is stable.  FOBT negative.  Iron studies showed mild iron deficiency, s/p a dose of IV infusion.  He has history of GI bleed secondary to NSAIDs in 2016.  CKD stage IIIb: Currently kidney function  stable at baseline  Hyperglycemia: Hemoglobin A1c of 6.6.    We will hold on starting on any antidiabetic medications in this elderly male.  I will recommend to follow-up with his PCP for close monitoring, further discussion//treatment of his mild diabetes.  Hyperlipidemia: On Crestor  at home,increased to 40 mg on DC  Debility/deconditioning: Patient lives alone.  He is hard on hearing.  PT /OT evaluation done, no follow-up recommended.   Discharge Diagnoses:  Active Problems:   HTN (hypertension), malignant   Chest pain   NSTEMI (non-ST elevated myocardial infarction) (HCC)   Elevated troponin    Discharge Instructions  Discharge Instructions    Diet - low sodium heart healthy   Complete by: As directed    Discharge instructions   Complete by: As directed    1)Please take prescribed medications as instructed 2)Follow up with your PCP in a week 3)You will be called by cardiology for the follow-up appointment 4)Continue your cholesterol medication. You have been diagnosed to have mild diabetes,we will recommend to follow up with your PCP for further management/discussion about treatment plan/monitoring.  Monitor ur blood sugars at home   Increase activity slowly   Complete by: As directed      Allergies as of 04/27/2020      Reactions   Doxazosin Mesylate Other (See Comments)      Medication List    TAKE these medications   amLODipine 10 MG tablet Commonly known as: NORVASC Take 1 tablet (10 mg total) by mouth daily. Start taking on: April 28, 2020 What changed:   medication strength  how much to take   aspirin 81 MG EC tablet Take 1 tablet (81 mg total) by mouth daily. Swallow whole. Start taking  on: April 28, 2020   carvedilol 6.25 MG tablet Commonly known as: COREG Take 1 tablet (6.25 mg total) by mouth 2 (two) times daily.   FISH OIL PO Take 1 capsule by mouth daily.   MULTIVITAMIN ADULTS 50+ PO Take 1 tablet by mouth daily.    paricalcitol 1 MCG capsule Commonly known as: ZEMPLAR Take 1 mcg by mouth daily.   rosuvastatin 40 MG tablet Commonly known as: CRESTOR Take 1 tablet (40 mg total) by mouth daily. Start taking on: April 28, 2020 What changed:   medication strength  how much to take       Follow-up Information    Daisy Floro, MD. Schedule an appointment as soon as possible for a visit in 1 week(s).   Specialty: Family Medicine Contact information: [address] [place] [address] [phone]              Allergies  Allergen Reactions  . Doxazosin Mesylate Other (See Comments)    Consultations:  Cardiology   Procedures/Studies: DG Chest 2 View  Result Date: 04/25/2020 CLINICAL DATA:  Chest pain. EXAM: CHEST - 2 VIEW COMPARISON:  Chest x-ray dated April 10, 2017. FINDINGS: The heart size and mediastinal contours are within normal limits. Both lungs are clear. The visualized skeletal structures are unremarkable. IMPRESSION: No active cardiopulmonary disease. Electronically Signed   By: Obie Dredge M.D.   On: 04/25/2020 11:46   CARDIAC CATHETERIZATION  Addendum Date: 04/26/2020    RPDA lesion is 75% stenosed.  1st Mrg lesion is 75% stenosed.  2nd Mrg lesion is 99% stenosed.  These three lesions are in small branch vessels.  1st Diag lesion is 50% stenosed.  LV end diastolic pressure is normal.  There is no aortic valve stenosis.  Diffuse small vessel disease in the distal PDA and small OM branches.  Main vessels are patent. I suspect that he had demand ischemia from HTN in the setting of small vessel disease.   Given that he has been pain free for some time, we opted for medical therapy. Continue medical therapy and BP control.   Addendum Date: 04/26/2020    RPDA lesion is 75% stenosed.  1st Mrg lesion is 75% stenosed.  2nd Mrg lesion is 99% stenosed.  1st Diag lesion is 50% stenosed.  LV end diastolic pressure is normal.  There is no aortic valve  stenosis.  These three lesions are in small branch vessels.  Diffuse small vessel disease in the distal PDA and small OM branches.  Main vessels are patent. I suspect that he had demand ischemia from HTN in the setting of small vessel disease.   Given that he has been pain free for some time, we opted for medical therapy. Continue medical therapy and BP control.   Result Date: 04/26/2020  RPDA lesion is 75% stenosed.  1st Mrg lesion is 75% stenosed.  2nd Mrg lesion is 99% stenosed.  1st Diag lesion is 50% stenosed.  LV end diastolic pressure is normal.  There is no aortic valve stenosis.  Diffuse small vessel disease in the distal PDA and small OM branches.  Main vessels are patent. I suspect that he had demand ischemia from HTN in the setting of small vessel disease. Continue medical therapy and BP control.   ECHOCARDIOGRAM COMPLETE  Result Date: 04/26/2020    ECHOCARDIOGRAM REPORT   Patient Name:   Juan Goodwin Date of Exam: 04/26/2020 Medical Rec #:  [id number]      Height:  74.0 in Accession #:    [id number]     Weight:       170.9 lb Date of Birth:  Jun 18, 1935      BSA:          2.033 m Patient Age:    84 years       BP:           142/92 mmHg Patient Gender: M              HR:           79 bpm. Exam Location:  Inpatient Procedure: 2D Echo, Intracardiac Opacification Agent, Color Doppler and Limited            Color Doppler Indications:    Chest Pain R07.9  History:        Patient has prior history of Echocardiogram examinations, most                 recent 08/21/2019. CAD and NSTEMI; Risk Factors:Hypertension and                 Dyslipidemia.  Sonographer:    Leta Junglingiffany Cooper RDCS Referring Phys: [id number] Emeline GeneralPING T ZHANG  Sonographer Comments: Global longitudinal strain was attempted. IMPRESSIONS  1. Left ventricular ejection fraction, by estimation, is 65 to 70%. The left ventricle has normal function. The left ventricle has no regional wall motion abnormalities. There is mild left ventricular  hypertrophy. Left ventricular diastolic parameters are consistent with Grade I diastolic dysfunction (impaired relaxation).  2. Right ventricular systolic function is mildly reduced. The right ventricular size is normal. Tricuspid regurgitation signal is inadequate for assessing PA pressure.  3. The mitral valve is normal in structure. No evidence of mitral valve regurgitation. No evidence of mitral stenosis.  4. The aortic valve is grossly normal. There is mild calcification of the aortic valve. Aortic valve regurgitation is not visualized. No aortic stenosis is present.  5. Aortic dilatation noted. There is mild dilatation of the aortic root, measuring 42 mm. FINDINGS  Left Ventricle: Left ventricular ejection fraction, by estimation, is 65 to 70%. The left ventricle has normal function. The left ventricle has no regional wall motion abnormalities. Global longitudinal strain performed but not reported based on interpreter judgement due to suboptimal tracking. The left ventricular internal cavity size was normal in size. There is mild left ventricular hypertrophy. Left ventricular diastolic parameters are consistent with Grade I diastolic dysfunction (impaired relaxation). Right Ventricle: The right ventricular size is normal. No increase in right ventricular wall thickness. Right ventricular systolic function is mildly reduced. Tricuspid regurgitation signal is inadequate for assessing PA pressure. Left Atrium: Left atrial size was normal in size. Right Atrium: Right atrial size was normal in size. Pericardium: There is no evidence of pericardial effusion. Mitral Valve: The mitral valve is normal in structure. No evidence of mitral valve regurgitation. No evidence of mitral valve stenosis. Tricuspid Valve: The tricuspid valve is normal in structure. Tricuspid valve regurgitation is not demonstrated. No evidence of tricuspid stenosis. Aortic Valve: The aortic valve is grossly normal. There is mild calcification of  the aortic valve. Aortic valve regurgitation is not visualized. No aortic stenosis is present. Pulmonic Valve: The pulmonic valve was not well visualized. Pulmonic valve regurgitation is not visualized. No evidence of pulmonic stenosis. Aorta: Aortic dilatation noted and the ascending aorta was not well visualized. There is mild dilatation of the aortic root, measuring 42 mm. Venous: The inferior vena cava was not well visualized. IAS/Shunts: The  interatrial septum was not well visualized.  LEFT VENTRICLE PLAX 2D LVIDd:         3.50 cm     Diastology LVIDs:         2.60 cm     LV e' medial:    3.26 cm/s LV PW:         1.20 cm     LV E/e' medial:  15.1 LV IVS:        1.30 cm     LV e' lateral:   6.85 cm/s LVOT diam:     2.30 cm     LV E/e' lateral: 7.2 LV SV:         62 LV SV Index:   31 LVOT Area:     4.15 cm  LV Volumes (MOD) LV vol d, MOD A2C: 39.2 ml LV vol d, MOD A4C: 50.3 ml LV vol s, MOD A2C: 12.7 ml LV vol s, MOD A4C: 17.6 ml LV SV MOD A2C:     26.5 ml LV SV MOD A4C:     50.3 ml LV SV MOD BP:      32.2 ml RIGHT VENTRICLE RV S prime:     10.30 cm/s TAPSE (M-mode): 1.7 cm LEFT ATRIUM             Index       RIGHT ATRIUM           Index LA diam:        2.70 cm 1.33 cm/m  RA Area:     10.90 cm LA Vol (A2C):   34.2 ml 16.82 ml/m RA Volume:   23.90 ml  11.76 ml/m LA Vol (A4C):   38.0 ml 18.69 ml/m LA Biplane Vol: 39.4 ml 19.38 ml/m  AORTIC VALVE LVOT Vmax:   86.20 cm/s LVOT Vmean:  60.900 cm/s LVOT VTI:    0.150 m  AORTA Ao Root diam: 4.20 cm MITRAL VALVE MV Area (PHT): 2.26 cm    SHUNTS MV Decel Time: 336 msec    Systemic VTI:  0.15 m MV E velocity: 49.30 cm/s  Systemic Diam: 2.30 cm MV A velocity: 89.10 cm/s MV E/A ratio:  0.55 Weston Brass MD Electronically signed by Weston Brass MD Signature Date/Time: 04/26/2020/10:25:18 AM    Final       Subjective: Patient seen and examined at the bedside this mrng. Medically stable for DC  Discharge Exam: Vitals:   04/27/20 0828 04/27/20 1155  BP:  131/86 119/78  Pulse: 79 79  Resp: 20 18  Temp: 97.9 F (36.6 C) 98.2 F (36.8 C)  SpO2: 100% 100%   Vitals:   04/27/20 0015 04/27/20 0454 04/27/20 0828 04/27/20 1155  BP: (!) 160/101 126/83 131/86 119/78  Pulse: 72 87 79 79  Resp: Temp:  98.5 F (36.9 C) 97.9 F (36.6 C) 98.2 F (36.8 C)  TempSrc:  Oral Oral Oral  SpO2: 98% 99% 100% 100%  Weight:      Height:        General: Pt is alert, awake, not in acute distress Cardiovascular: RRR, S1/S2 +, no rubs, no gallops Respiratory: CTA bilaterally, no wheezing, no rhonchi Abdominal: Soft, NT, ND, bowel sounds + Extremities: no edema, no cyanosis    The results of significant diagnostics from this hospitalization (including imaging, microbiology, ancillary and laboratory) are listed below for reference.     Microbiology: Recent Results (from the past 240 hour(s))  SARS CORONAVIRUS 2 (TAT 6-24 HRS) Nasopharyngeal  Nasopharyngeal Swab     Status: None   Collection Time: 04/25/20  3:37 PM   Specimen: Nasopharyngeal Swab  Result Value Ref Range Status   SARS Coronavirus 2 NEGATIVE NEGATIVE Final    Comment: (NOTE) SARS-CoV-2 target nucleic acids are NOT DETECTED.  The SARS-CoV-2 RNA is generally detectable in upper and lower respiratory specimens during the acute phase of infection. Negative results do not preclude SARS-CoV-2 infection, do not rule out co-infections with other pathogens, and should not be used as the sole basis for treatment or other patient management decisions. Negative results must be combined with clinical observations, patient history, and epidemiological information. The expected result is Negative.  Fact Sheet for Patients: HairSlick.no  Fact Sheet for Healthcare Providers: quierodirigir.com  This test is not yet approved or cleared by the [place] FDA and  has been authorized for detection and/or diagnosis of SARS-CoV-2  by FDA under an Emergency Use Authorization (EUA). This EUA will remain  in effect (meaning this test can be used) for the duration of the COVID-19 declaration under Se ction 564(b)(1) of the Act, 21 U.S.C. section 360bbb-3(b)(1), unless the authorization is terminated or revoked sooner.  Performed at [facility], [address], [place], [place] [address]      Labs: BNP (last 3 results) No results for input(s): BNP in the last 8760 hours. Basic Metabolic Panel: Recent Labs  Lab 04/25/20 1119 04/25/20 2244 04/26/20 0802 04/27/20 0043  NA 139  --  138 135  K 3.3*  --  4.7 3.8  CL 109  --  104 105  CO2 20*  --  24 22  GLUCOSE 141*  --  114* 110*  BUN 14  --  15 16  CREATININE 1.69*  --  1.61* 1.52*  CALCIUM 8.1*  --  9.4 9.1  MG  --  1.7  --   --    Liver Function Tests: No results for input(s): AST, ALT, ALKPHOS, BILITOT, PROT, ALBUMIN in the last 168 hours. No results for input(s): LIPASE, AMYLASE in the last 168 hours. No results for input(s): AMMONIA in the last 168 hours. CBC: Recent Labs  Lab 04/25/20 1119 04/25/20 2244 04/26/20 0058 04/27/20 0043  WBC 4.1  --  5.3 5.2  NEUTROABS 2.2  --   --   --   HGB 9.1* 12.1* 11.5* 11.5*  HCT 29.0* 37.3* 35.4* 33.6*  MCV 80.8  --  78.1* 76.2*  PLT 127*  --  174 164   Cardiac Enzymes: No results for input(s): CKTOTAL, CKMB, CKMBINDEX, TROPONINI in the last 168 hours. BNP: Invalid input(s): POCBNP CBG: No results for input(s): GLUCAP in the last 168 hours. D-Dimer No results for input(s): DDIMER in the last 72 hours. Hgb A1c Recent Labs    04/25/20 2244  HGBA1C 6.6*   Lipid Profile Recent Labs    04/26/20 0802  CHOL 142  HDL 40*  LDLCALC 91  TRIG 53  CHOLHDL 3.6   Thyroid function studies No results for input(s): TSH, T4TOTAL, T3FREE, THYROIDAB in the last 72 hours.  Invalid input(s): FREET3 Anemia work up Recent Labs    04/25/20 2244  FERRITIN 42  TIBC 258  IRON 44*   Urinalysis     Component Value Date/Time   COLORURINE YELLOW 04/10/2017 1741   APPEARANCEUR CLEAR 04/10/2017 1741   LABSPEC 1.011 04/10/2017 1741   PHURINE 5.0 04/10/2017 1741   GLUCOSEU NEGATIVE 04/10/2017 1741   HGBUR NEGATIVE 04/10/2017 1741   BILIRUBINUR NEGATIVE  04/10/2017 1741   KETONESUR NEGATIVE 04/10/2017 1741   PROTEINUR NEGATIVE 04/10/2017 1741   UROBILINOGEN 0.2 07/11/2014 0827   NITRITE NEGATIVE 04/10/2017 1741   LEUKOCYTESUR NEGATIVE 04/10/2017 1741   Sepsis Labs Invalid input(s): PROCALCITONIN,  WBC,  LACTICIDVEN Microbiology Recent Results (from the past 240 hour(s))  SARS CORONAVIRUS 2 (TAT 6-24 HRS) Nasopharyngeal Nasopharyngeal Swab     Status: None   Collection Time: 04/25/20  3:37 PM   Specimen: Nasopharyngeal Swab  Result Value Ref Range Status   SARS Coronavirus 2 NEGATIVE NEGATIVE Final    Comment: (NOTE) SARS-CoV-2 target nucleic acids are NOT DETECTED.  The SARS-CoV-2 RNA is generally detectable in upper and lower respiratory specimens during the acute phase of infection. Negative results do not preclude SARS-CoV-2 infection, do not rule out co-infections with other pathogens, and should not be used as the sole basis for treatment or other patient management decisions. Negative results must be combined with clinical observations, patient history, and epidemiological information. The expected result is Negative.  Fact Sheet for Patients: HairSlick.no  Fact Sheet for Healthcare Providers: quierodirigir.com  This test is not yet approved or cleared by the [place] FDA and  has been authorized for detection and/or diagnosis of SARS-CoV-2 by FDA under an Emergency Use Authorization (EUA). This EUA will remain  in effect (meaning this test can be used) for the duration of the COVID-19 declaration under Se ction 564(b)(1) of the Act, 21 U.S.C. section 360bbb-3(b)(1), unless the authorization is terminated  or revoked sooner.  Performed at [facility], [address] [place], [place] [address]     Please note: You were cared for by a hospitalist during your hospital stay. Once you are discharged, your primary care physician will handle any further medical issues. Please note that NO REFILLS for any discharge medications will be authorized once you are discharged, as it is imperative that you return to your primary care physician (or establish a relationship with a primary care physician if you do not have one) for your post hospital discharge needs so that they can reassess your need for medications and monitor your lab values.    Time coordinating discharge: 40 minutes  SIGNED:   Burnadette Pop, MD  [facility] 04/27/2020, 1:53 PM Pager [phone]  If 7PM-7AM, please contact night-coverage [email or web address] Password [email or web address]

## 2020-05-03 DIAGNOSIS — E785 Hyperlipidemia, unspecified: Secondary | ICD-10-CM | POA: Diagnosis not present

## 2020-05-03 DIAGNOSIS — Z8719 Personal history of other diseases of the digestive system: Secondary | ICD-10-CM | POA: Diagnosis not present

## 2020-05-03 DIAGNOSIS — Z79899 Other long term (current) drug therapy: Secondary | ICD-10-CM | POA: Diagnosis not present

## 2020-05-03 DIAGNOSIS — I2510 Atherosclerotic heart disease of native coronary artery without angina pectoris: Secondary | ICD-10-CM | POA: Diagnosis not present

## 2020-05-03 DIAGNOSIS — E1122 Type 2 diabetes mellitus with diabetic chronic kidney disease: Secondary | ICD-10-CM | POA: Diagnosis not present

## 2020-05-03 DIAGNOSIS — Z09 Encounter for follow-up examination after completed treatment for conditions other than malignant neoplasm: Secondary | ICD-10-CM | POA: Diagnosis not present

## 2020-05-03 DIAGNOSIS — R079 Chest pain, unspecified: Secondary | ICD-10-CM | POA: Diagnosis not present

## 2020-05-03 DIAGNOSIS — D649 Anemia, unspecified: Secondary | ICD-10-CM | POA: Diagnosis not present

## 2020-05-03 DIAGNOSIS — I214 Non-ST elevation (NSTEMI) myocardial infarction: Secondary | ICD-10-CM | POA: Diagnosis not present

## 2020-05-04 ENCOUNTER — Other Ambulatory Visit: Payer: Self-pay | Admitting: Cardiology

## 2020-05-10 ENCOUNTER — Ambulatory Visit (INDEPENDENT_AMBULATORY_CARE_PROVIDER_SITE_OTHER): Payer: Medicare HMO | Admitting: Pharmacist

## 2020-05-10 ENCOUNTER — Other Ambulatory Visit: Payer: Self-pay

## 2020-05-10 VITALS — BP 108/76 | HR 61

## 2020-05-10 DIAGNOSIS — I10 Essential (primary) hypertension: Secondary | ICD-10-CM

## 2020-05-10 DIAGNOSIS — E782 Mixed hyperlipidemia: Secondary | ICD-10-CM | POA: Diagnosis not present

## 2020-05-10 MED ORDER — EZETIMIBE 10 MG PO TABS: 10.0000 mg | ORAL_TABLET | Freq: Every day | ORAL | 3 refills | Status: DC

## 2020-05-10 MED ORDER — AMLODIPINE BESYLATE 10 MG PO TABS: 10.0000 mg | ORAL_TABLET | Freq: Every day | ORAL | 3 refills | Status: DC

## 2020-05-10 MED ORDER — ASPIRIN 81 MG PO TBEC: 81.0000 mg | DELAYED_RELEASE_TABLET | Freq: Every day | ORAL | 3 refills | Status: DC

## 2020-05-10 MED ORDER — CARVEDILOL 3.125 MG PO TABS: 3.1250 mg | ORAL_TABLET | Freq: Two times a day (BID) | ORAL | 11 refills | Status: DC

## 2020-05-10 MED ORDER — ROSUVASTATIN CALCIUM 40 MG PO TABS: 40.0000 mg | ORAL_TABLET | Freq: Every day | ORAL | 3 refills | Status: AC

## 2020-05-10 NOTE — Patient Instructions (Addendum)
Blood pressure: Your blood pressure goal is < 130/16mmHg  Decrease your carvedilol from 6.25mg  to 3.125mg  twice daily. You can use up your current dose by taking 1/2 tablet twice daily. When you run out, pick up your new prescription and start taking 1 tablet of the 3.125mg  dose twice daily  Continue taking your amlodipine 10mg  daily for your blood pressure. I'll call you in a few weeks to see how you're doing.  Cholesterol: Stop taking fish oil. Start taking ezetimibe (Zetia) 10mg  - 1 tablet once daily. THis will lower your LDL (bad) cholesterol by an extra 20%. Your LDL is currently 91 and your goal is < 70. Continue taking the rosuvastatin 40mg  daily as this lowers your LDL by > 50% as well.  Recheck fasting cholesterol labs on Monday, May 16th any time after 7:30am.

## 2020-05-10 NOTE — Progress Notes (Signed)
Patient ID: Juan Goodwin                 DOB: 12-22-1935                      MRN: [id number]     HPI: STEPHANIE MCGLONE is a 85 y.o. male previously referred by Dr Mayford Knife and followed in HTN clinic in 2021. PMH is significant for CVA, upper GI bleed secondary to NSAIDs in 2016, CKD stage 3, HTN, orthostatic hypotension, subdural hematoma in 2019, exertional chest pain and SOB. At previous visits last year, pt was poor historian regarding medications and his reported medications did not match meds that his pharmacy was filling. He had been restarted on both rosuvastatin and amlodipine at that time, BP had been controlled. Pt was admitted on 04/25/20 with chest pain, exertional dyspnea, fatigue, and severe hypertension. Troponins were elevated and EKG showed ST-T changes concerning for NSTEMI. He underwent cath 04/26/20 which showed diffuse small vessel diease in the distal PDA and small OM branches with recommendation for medication management. Echo showed LVEF 65-70% with grade 1 diastolic dysfunction. His rosuvastatin was increased to 40mg  daily at discharge and he was started on aspirin 81mg  daily. He was also diagnosed with DM with A1c of 6.6%, management deferred to PCP once discharged. His amlodipine 10mg  was refilled, however pharmacy was previously filling 5mg  dose per medication dispense report.   Pt presents today in good spirits with his girlfriend. Reports taking medications as instructed. Saw PCP on 2/21, BP was 128/80, he was given new rx for prn NTG, he has not needed any yet. Denies falls and LEE. Does have some occasional dizziness, not worse than it was before hospitalization. His girlfriend reports that he has been sleeping a lot during the day. Has not been exercising because of his constant fatigue. Home BP was 120/74 recently, SBP 107 last night. Has seen more systolics around 110. Reports tolerating higher dose of rosuvastatin well.  Current HTN meds: amlodipine 10mg  daily, carvedilol  6.25mg  BID Previously tried: carvedilol 6.25mg  BID - dizzy/woozy feeling BP goal: <130/54mmHg  Family History: Heart failure in his mother.  Social History: Former smoker 1 PPD for 35 years, quit 15 years ago. Denies alcohol and illicit drug use.  Diet: Eats 2 meals a day. Likes eggs, bacon and toast. Trying to eat more greens, avoiding fried food. Hasn't had as much of an appetite lately. Changed to decaf tea.  Exercise: Not much, sleeps a lot during the day.  Home BP readings: has not checked since dose increase of carvedilol. Prevoiusly ran 140/80-90 at home  Labs: 04/27/20: SCr 1.52 Na 135, K 3.8, glucose 110, Hgb 11.5 04/26/20: TC 142, TG 53, HDL 40, LDL 91  Wt Readings from Last 3 Encounters:  04/25/20 170 lb 13.7 oz (77.5 kg)  08/21/19 184 lb (83.5 kg)  07/30/19 184 lb 3.2 oz (83.6 kg)   BP Readings from Last 3 Encounters:  04/27/20 119/78  09/05/19 124/80  08/21/19 140/90   Pulse Readings from Last 3 Encounters:  04/27/20 79  09/05/19 72  08/21/19 69    Renal function: CrCl cannot be calculated (Unknown ideal weight.).  Past Medical History:  Diagnosis Date  . Acute pyloric channel ulcer 07/13/2014  . Acute upper GI bleed 07/13/2014  . Cerebrovascular disease 10/08/2018  . Chronic kidney disease (CKD), stage III (moderate) (HCC) 03/30/2017  . Gastrointestinal hemorrhage with melena 06/2014  . Global aphasia 03/27/2017  .  Helicobacter pylori gastritis 07/13/2014  . High cholesterol   . Hypernatremia 07/13/2014  . Hypertension   . Hypokalemia 07/13/2014  . Normocytic normochromic anemia 04/10/2017  . REM sleep behavior disorder 10/08/2018  . Subdural hematoma (HCC) 03/26/2017  . Thrombocytopenia (HCC) 07/13/2014    Current Outpatient Medications on File Prior to Visit  Medication Sig Dispense Refill  . amLODipine (NORVASC) 10 MG tablet Take 1 tablet (10 mg total) by mouth daily. 30 tablet 1  . aspirin EC 81 MG EC tablet Take 1 tablet (81 mg total) by mouth daily.  Swallow whole. 30 tablet 1  . carvedilol (COREG) 6.25 MG tablet TAKE 1 TABLET(6.25 MG) BY MOUTH TWICE DAILY 180 tablet 3  . Multiple Vitamins-Minerals (MULTIVITAMIN ADULTS 50+ PO) Take 1 tablet by mouth daily.    . Omega-3 Fatty Acids (FISH OIL PO) Take 1 capsule by mouth daily.    . paricalcitol (ZEMPLAR) 1 MCG capsule Take 1 mcg by mouth daily.  5  . rosuvastatin (CRESTOR) 40 MG tablet Take 1 tablet (40 mg total) by mouth daily. 30 tablet 1   No current facility-administered medications on file prior to visit.    Allergies  Allergen Reactions  . Doxazosin Mesylate Other (See Comments)     Assessment/Plan:  1. Hypertension - BP at goal <130/39mmHg in clinic, on the low side actually. He reports daytime somnolence and occasional dizziness, so I will decrease his carvedilol dose to 3.125mg  BID which pt has historically tolerated better than higher doses. Will continue amlodipine 10mg  daily. Pt encouraged to monitor his BP. Will call in a few weeks to see how home BP readings are looking and if his symptoms have improved.  2. Hyperlipidemia - LDL 92 at recent hospitalization. Rosuvastatin was increased from 20mg  to 40mg  daily which will lower LDL about 6% and not bring pt to goal < 70. Will also start ezetimibe 10mg  daily. Scheduled f/u labs in 3 months to assess efficacy.  Eriona Kinchen E. Lynisha Osuch, PharmD, BCACP, CPP [facility] [address], [place],  Phone: [phone]; Fax: [phone] 05/10/2020 7:30 AM

## 2020-05-24 DIAGNOSIS — I252 Old myocardial infarction: Secondary | ICD-10-CM | POA: Diagnosis not present

## 2020-05-24 DIAGNOSIS — N183 Chronic kidney disease, stage 3 unspecified: Secondary | ICD-10-CM | POA: Diagnosis not present

## 2020-05-24 DIAGNOSIS — D509 Iron deficiency anemia, unspecified: Secondary | ICD-10-CM | POA: Diagnosis not present

## 2020-05-24 DIAGNOSIS — E1122 Type 2 diabetes mellitus with diabetic chronic kidney disease: Secondary | ICD-10-CM | POA: Diagnosis not present

## 2020-05-24 DIAGNOSIS — N2581 Secondary hyperparathyroidism of renal origin: Secondary | ICD-10-CM | POA: Diagnosis not present

## 2020-05-24 DIAGNOSIS — I129 Hypertensive chronic kidney disease with stage 1 through stage 4 chronic kidney disease, or unspecified chronic kidney disease: Secondary | ICD-10-CM | POA: Diagnosis not present

## 2020-05-24 DIAGNOSIS — N179 Acute kidney failure, unspecified: Secondary | ICD-10-CM | POA: Diagnosis not present

## 2020-07-26 ENCOUNTER — Other Ambulatory Visit: Payer: Self-pay

## 2020-07-26 ENCOUNTER — Other Ambulatory Visit: Payer: Medicare HMO | Admitting: *Deleted

## 2020-07-26 ENCOUNTER — Other Ambulatory Visit: Payer: Self-pay | Admitting: Cardiology

## 2020-07-26 DIAGNOSIS — E782 Mixed hyperlipidemia: Secondary | ICD-10-CM

## 2020-07-26 LAB — LIPID PANEL
Chol/HDL Ratio: 3.5 ratio (ref 0.0–5.0)
Cholesterol, Total: 120 mg/dL (ref 100–199)
HDL: 34 mg/dL — ABNORMAL LOW (ref >39)
LDL Chol Calc (NIH): 70 mg/dL (ref 0–99)
Triglycerides: 81 mg/dL (ref 0–149)
VLDL Cholesterol Cal: 16 mg/dL (ref 5–40)

## 2020-07-26 LAB — ALT: ALT: 9 IU/L (ref 0–44)

## 2020-08-02 ENCOUNTER — Other Ambulatory Visit: Payer: Self-pay | Admitting: Cardiology

## 2020-10-11 ENCOUNTER — Other Ambulatory Visit: Payer: Self-pay

## 2020-10-11 ENCOUNTER — Emergency Department (HOSPITAL_COMMUNITY): Payer: Medicare HMO

## 2020-10-11 ENCOUNTER — Observation Stay (HOSPITAL_COMMUNITY)
Admission: EM | Admit: 2020-10-11 | Discharge: 2020-10-12 | Disposition: A | Discharge status: Home / Self Care | Payer: Medicare HMO | Attending: Cardiology | Admitting: Cardiology

## 2020-10-11 DIAGNOSIS — I208 Other forms of angina pectoris: Secondary | ICD-10-CM | POA: Diagnosis present

## 2020-10-11 DIAGNOSIS — I2510 Atherosclerotic heart disease of native coronary artery without angina pectoris: Secondary | ICD-10-CM

## 2020-10-11 DIAGNOSIS — E785 Hyperlipidemia, unspecified: Secondary | ICD-10-CM | POA: Diagnosis not present

## 2020-10-11 DIAGNOSIS — Z87891 Personal history of nicotine dependence: Secondary | ICD-10-CM | POA: Diagnosis not present

## 2020-10-11 DIAGNOSIS — I10 Essential (primary) hypertension: Secondary | ICD-10-CM | POA: Diagnosis not present

## 2020-10-11 DIAGNOSIS — Z7982 Long term (current) use of aspirin: Secondary | ICD-10-CM | POA: Insufficient documentation

## 2020-10-11 DIAGNOSIS — Z79899 Other long term (current) drug therapy: Secondary | ICD-10-CM | POA: Diagnosis not present

## 2020-10-11 DIAGNOSIS — I200 Unstable angina: Secondary | ICD-10-CM

## 2020-10-11 DIAGNOSIS — I129 Hypertensive chronic kidney disease with stage 1 through stage 4 chronic kidney disease, or unspecified chronic kidney disease: Secondary | ICD-10-CM | POA: Insufficient documentation

## 2020-10-11 DIAGNOSIS — I25110 Atherosclerotic heart disease of native coronary artery with unstable angina pectoris: Principal | ICD-10-CM | POA: Insufficient documentation

## 2020-10-11 DIAGNOSIS — I2089 Other forms of angina pectoris: Secondary | ICD-10-CM | POA: Diagnosis present

## 2020-10-11 DIAGNOSIS — N183 Chronic kidney disease, stage 3 unspecified: Secondary | ICD-10-CM | POA: Diagnosis present

## 2020-10-11 DIAGNOSIS — Z20822 Contact with and (suspected) exposure to covid-19: Secondary | ICD-10-CM | POA: Diagnosis not present

## 2020-10-11 DIAGNOSIS — R079 Chest pain, unspecified: Secondary | ICD-10-CM | POA: Diagnosis not present

## 2020-10-11 LAB — BASIC METABOLIC PANEL
Anion gap: 8 (ref 5–15)
BUN: 19 mg/dL (ref 8–23)
CO2: 27 mmol/L (ref 22–32)
Calcium: 9.6 mg/dL (ref 8.9–10.3)
Chloride: 103 mmol/L (ref 98–111)
Creatinine, Ser: 1.57 mg/dL — ABNORMAL HIGH (ref 0.61–1.24)
GFR, Estimated: 43 mL/min — ABNORMAL LOW (ref >60)
Glucose, Bld: 148 mg/dL — ABNORMAL HIGH (ref 70–99)
Potassium: 3.9 mmol/L (ref 3.5–5.1)
Sodium: 138 mmol/L (ref 135–145)

## 2020-10-11 LAB — CBC WITH DIFFERENTIAL/PLATELET
Abs Immature Granulocytes: 0.01 10*3/uL (ref 0.00–0.07)
Basophils Absolute: 0 10*3/uL (ref 0.0–0.1)
Basophils Relative: 1 %
Eosinophils Absolute: 0.1 10*3/uL (ref 0.0–0.5)
Eosinophils Relative: 2 %
HCT: 38.5 % — ABNORMAL LOW (ref 39.0–52.0)
Hemoglobin: 12.6 g/dL — ABNORMAL LOW (ref 13.0–17.0)
Immature Granulocytes: 0 %
Lymphocytes Relative: 32 %
Lymphs Abs: 1.6 10*3/uL (ref 0.7–4.0)
MCH: 26.4 pg (ref 26.0–34.0)
MCHC: 32.7 g/dL (ref 30.0–36.0)
MCV: 80.7 fL (ref 80.0–100.0)
Monocytes Absolute: 0.4 10*3/uL (ref 0.1–1.0)
Monocytes Relative: 7 %
Neutro Abs: 3 10*3/uL (ref 1.7–7.7)
Neutrophils Relative %: 58 %
Platelets: 197 10*3/uL (ref 150–400)
RBC: 4.77 MIL/uL (ref 4.22–5.81)
RDW: 15 % (ref 11.5–15.5)
WBC: 5.2 10*3/uL (ref 4.0–10.5)
nRBC: 0 % (ref 0.0–0.2)

## 2020-10-11 LAB — TROPONIN I (HIGH SENSITIVITY)
Troponin I (High Sensitivity): 8 ng/L (ref <18)
Troponin I (High Sensitivity): 9 ng/L (ref <18)

## 2020-10-11 MED ORDER — ACETAMINOPHEN 325 MG PO TABS: 650.0000 mg | ORAL_TABLET | ORAL | Status: DC | PRN

## 2020-10-11 MED ORDER — CARVEDILOL 3.125 MG PO TABS: 3.1250 mg | ORAL_TABLET | Freq: Two times a day (BID) | ORAL | Status: DC

## 2020-10-11 MED ORDER — AMLODIPINE BESYLATE 5 MG PO TABS: 10.0000 mg | ORAL_TABLET | Freq: Every day | ORAL | Status: DC

## 2020-10-11 MED ORDER — ASPIRIN EC 81 MG PO TBEC: 81.0000 mg | DELAYED_RELEASE_TABLET | Freq: Every day | ORAL | Status: DC

## 2020-10-11 MED ORDER — AMLODIPINE BESYLATE 5 MG PO TABS
10.0000 mg | ORAL_TABLET | Freq: Every day | ORAL | Status: DC
  Administered 2020-10-11: 10 mg via ORAL
  Filled 2020-10-11: qty 2

## 2020-10-11 MED ORDER — PARICALCITOL 1 MCG PO CAPS
1.0000 ug | ORAL_CAPSULE | Freq: Every day | ORAL | Status: DC
  Filled 2020-10-11: qty 1

## 2020-10-11 MED ORDER — ISOSORBIDE MONONITRATE ER 30 MG PO TB24
15.0000 mg | ORAL_TABLET | Freq: Every day | ORAL | Status: DC
  Administered 2020-10-11: 15 mg via ORAL
  Filled 2020-10-11: qty 1

## 2020-10-11 MED ORDER — NITROGLYCERIN 0.4 MG SL SUBL: 0.4000 mg | SUBLINGUAL_TABLET | SUBLINGUAL | Status: DC | PRN

## 2020-10-11 MED ORDER — EZETIMIBE 10 MG PO TABS
10.0000 mg | ORAL_TABLET | Freq: Every day | ORAL | Status: DC
  Filled 2020-10-11: qty 1

## 2020-10-11 MED ORDER — CARVEDILOL 3.125 MG PO TABS
3.1250 mg | ORAL_TABLET | Freq: Two times a day (BID) | ORAL | Status: DC
  Administered 2020-10-11: 3.125 mg via ORAL
  Filled 2020-10-11: qty 1

## 2020-10-11 MED ORDER — ROSUVASTATIN CALCIUM 20 MG PO TABS: 40.0000 mg | ORAL_TABLET | Freq: Every day | ORAL | Status: DC

## 2020-10-11 MED ORDER — ASPIRIN 300 MG RE SUPP: 300.0000 mg | RECTAL | Status: AC

## 2020-10-11 MED ORDER — HYDRALAZINE HCL 10 MG PO TABS: 10.0000 mg | ORAL_TABLET | Freq: Three times a day (TID) | ORAL | Status: DC | PRN

## 2020-10-11 MED ORDER — ASPIRIN 81 MG PO TBEC: 81.0000 mg | DELAYED_RELEASE_TABLET | Freq: Every day | ORAL | Status: DC

## 2020-10-11 MED ORDER — HEPARIN SODIUM (PORCINE) 5000 UNIT/ML IJ SOLN
5000.0000 [IU] | Freq: Three times a day (TID) | INTRAMUSCULAR | Status: DC
  Administered 2020-10-11 – 2020-10-12 (×3): 5000 [IU] via SUBCUTANEOUS
  Filled 2020-10-11 (×3): qty 1

## 2020-10-11 MED ORDER — ASPIRIN 81 MG PO CHEW
324.0000 mg | CHEWABLE_TABLET | ORAL | Status: AC
  Administered 2020-10-11: 324 mg via ORAL
  Filled 2020-10-11: qty 4

## 2020-10-11 NOTE — ED Triage Notes (Signed)
Pt arrives from doctor's office for evaluation of CP and ECG. Pt reports having CP that started yesterday, took one nitro SL with improvement. Today went to see doctor, was symptom free at that time and sent for cardiac workup.

## 2020-10-11 NOTE — ED Provider Notes (Signed)
Emergency Medicine Provider Triage Evaluation Note  Juan Goodwin , a 85 y.o. male  was evaluated in triage.  Pt complains of left-sided chest pain that woke him from his sleep yesterday and was resolved complete with nitro.  Pain was nonexertional and did radiate to the left arm.  No palpitations or shortness of breath. Seen in cardiology office this morning with concern for unstable angina sent to ED for further evaluation.  Review of Systems  Positive: Chest pain yesterday, asymptomatic at this time. Negative: Nausea, vomiting, shortness of breath and palpitations  Physical Exam  BP (!) 168/122 (BP Location: Right Arm)   Pulse 94   Temp 98.7 F (37.1 C) (Oral)   Resp 18   SpO2 100%  Gen:   Awake, no distress   Resp:  Normal effort  MSK:   Moves extremities without difficulty  Other:  RRR no M/R/D.  Lungs CTA B.  Medical Decision Making  Medically screening exam initiated at 3:30 PM.  Appropriate orders placed.  LUISDAVID HAMBLIN was informed that the remainder of the evaluation will be completed by another provider, this initial triage assessment does not replace that evaluation, and the importance of remaining in the ED until their evaluation is complete.  This chart was dictated using voice recognition software, Dragon. Despite the best efforts of this provider to proofread and correct errors, errors may still occur which can change documentation meaning.    Sherrilee Gilles 10/11/20 1531    Ernie Avena, MD 10/11/20 2033

## 2020-10-11 NOTE — H&P (Signed)
Cardiology Admission History and Physical:   Patient ID: Juan MantleJimmy L Dortch MRN: [id number]; DOB: 23-Sep-1935   Admission date: 10/11/2020  PCP:  Daisy Florooss, Charles Alan, MD   [facility] Providers Cardiologist:  Armanda Magicraci Turner, MD        Chief Complaint:  Chest Pain  Patient Profile:   Juan Goodwin is a 85 y.o. male with multivessel CAD c/b NSTEMI (04/2020), HTN, HLD, CKD 3, remote SDH 2/2, remote GI bleed 2/2 NSAID use (2016), and ability who is being seen 10/11/2020 for the evaluation of chest pain.   History of Present Illness:   Juan MantleJimmy L Schwarzkopf is a 85 y.o. male with multivessel CAD c/b NSTEMI (04/2020), HTN, HLD, CKD 3, remote SDH 2/2, remote GI bleed 2/2 NSAID use (2016), and ability who is being seen 10/11/2020 for the evaluation of chest pain.  Following history was obtained from the patient and his daughter.  The patient is not the best historian.  The patient reports that on Saturday upon waking up in the morning developed left-sided chest pain.  He said that this pain was severe 8/10 and occurred at rest that is " similar to the pain he had when he had a heart attack in February."  It was associated with bilateral shoulder pain as well.  Nothing makes the pain worse.  He took a sublingual nitroglycerin which completely resolved the pain.  Since Saturday, he has had intermittent episodes of chest pain every 10 hours, each time the pain being relieved by a nitroglycerin tablet.  This morning however, the chest pain started to become more frequent occurring every 2 hours requiring 3 sublingual nitroglycerin this morning.  Given his chest pain the patient visited his PCP Dr. Tenny Crawoss who instructed the patient to come to the ED for evaluation.  The patient denies having any fever, chills, diaphoresis, SOB, nausea, vomiting, abdominal pain, weakness, numbness, swelling, PND, orthopnea, syncope, or palpitations.  Of note, the patient [facility] for an NSTEMI in the  setting of elevated BPs.  He reports that his chest pain today is similar to the chest pain that he had back in February; however, he currently does not have SOB.  In February he underwent LHC which showed diffuse small vessel disease in the distal PDA and small OM branches -none of which were amenable to PCI.  Medical management was therefore pursued.  Since this time, the patient has not had any chest pain and has not needed to use any nitroglycerin until now.  In the ED the patient's VS were afebrile, HR 94, BP 168/122 (did not take his BP medications today but normally SBP less than 130 per daughter's report), RR 18 and satting 100% on room air.  Labs notable for troponin 8 at baseline and 9 at 3 hours, and serum creatinine 1.57.  CXR was negative for pulmonary infiltrates or edema.  EKG was negative for ST segment changes or T wave inversions.  In the ED he was loaded with ASA and cardiology was consulted for evaluation of chest pain.   Past Medical History:  Diagnosis Date   Acute pyloric channel ulcer 07/13/2014   Acute upper GI bleed 07/13/2014   Cerebrovascular disease 10/08/2018   Chronic kidney disease (CKD), stage III (moderate) (HCC) 03/30/2017   Gastrointestinal hemorrhage with melena 06/2014   Global aphasia 03/27/2017   Helicobacter pylori gastritis 07/13/2014   High cholesterol    Hypernatremia 07/13/2014   Hypertension    Hypokalemia  07/13/2014   Normocytic normochromic anemia 04/10/2017   REM sleep behavior disorder 10/08/2018   Subdural hematoma (HCC) 03/26/2017   Thrombocytopenia (HCC) 07/13/2014    Past Surgical History:  Procedure Laterality Date   BRAIN SURGERY     CRANIOTOMY Left 03/26/2017   Procedure: CRANIOTOMY HEMATOMA EVACUATION SUBDURAL;  Surgeon: Donalee Citrin, MD;  Location[facility];  Service: Neurosurgery;  Laterality: Left;   ESOPHAGOGASTRODUODENOSCOPY N/A 07/11/2014   Procedure: ESOPHAGOGASTRODUODENOSCOPY (EGD);  Surgeon: Charlott Rakes, MD;  Location: [facility];   Service: Endoscopy;  Laterality: N/A;   LEFT HEART CATH AND CORONARY ANGIOGRAPHY N/A 04/26/2020   Procedure: LEFT HEART CATH AND CORONARY ANGIOGRAPHY;  Surgeon: Corky Crafts, MD;  Location: [facility] INVASIVE CV LAB;  Service: Cardiovascular;  Laterality: N/A;     Medications Prior to Admission: Prior to Admission medications   Medication Sig Start Date End Date Taking? Authorizing Provider  amLODipine (NORVASC) 10 MG tablet Take 1 tablet (10 mg total) by mouth daily. 05/10/20  Yes Quintella Reichert, MD  aspirin 81 MG EC tablet Take 1 tablet (81 mg total) by mouth daily. Swallow whole. 05/10/20  Yes Quintella Reichert, MD  carvedilol (COREG) 3.125 MG tablet Take 1 tablet (3.125 mg total) by mouth 2 (two) times daily with a meal. 05/10/20   Turner, Cornelious Bryant, MD  ezetimibe (ZETIA) 10 MG tablet Take 1 tablet (10 mg total) by mouth daily. 05/10/20   Quintella Reichert, MD  Multiple Vitamins-Minerals (MULTIVITAMIN ADULTS 50+ PO) Take 1 tablet by mouth daily.    [provider]  nitroGLYCERIN (NITROSTAT) 0.4 MG SL tablet Place 0.4 mg under the tongue every 5 (five) minutes as needed for chest pain.    [provider]  paricalcitol (ZEMPLAR) 1 MCG capsule Take 1 mcg by mouth daily. 05/26/14   [provider]  rosuvastatin (CRESTOR) 40 MG tablet Take 1 tablet (40 mg total) by mouth daily. 05/10/20   Quintella Reichert, MD     Allergies:    Allergies  Allergen Reactions   Doxazosin Mesylate Other (See Comments)    Social History:   Social History   Socioeconomic History   Marital status: Widowed    Spouse name: Not on file   Number of children: 3   Years of education: 14   Highest education level: Not on file  Occupational History   Not on file  Tobacco Use   Smoking status: Former    Packs/day: 1.00    Years: 35.00    Pack years: 35.00    Types: Cigarettes   Smokeless tobacco: Never  Substance and Sexual Activity   Alcohol use: No   Drug use: Not Currently   Sexual  activity: Never  Other Topics Concern   Not on file  Social History Narrative   Right handed    Occasional cup of coffee    Lives at home alone   Wife is deceased along with 2 children 1 child is living     Social Determinants of Corporate investment banker Strain: Not on file  Food Insecurity: Not on file  Transportation Needs: Not on file  Physical Activity: Not on file  Stress: Not on file  Social Connections: Not on file  Intimate Partner Violence: Not on file    Family History:   The patient's family history includes Heart failure in his mother.    ROS:  Please see the history of present illness.  All other ROS reviewed and negative.  Physical Exam/Data:   Vitals:   10/11/20 2041 10/11/20 2100 10/11/20 2130 10/11/20 2200  BP: (!) 187/115 (!) 174/105 (!) 176/110 (!) 193/120  Pulse: 73 75 79 77  Resp: (!) 21 19 (!) 21 19  Temp:      TempSrc:      SpO2: 98% 99% 100% 100%  Weight:      Height:       No intake or output data in the 24 hours ending 10/11/20 2309 Last 3 Weights 10/11/2020 04/25/2020 04/25/2020  Weight (lbs) 167 lb 170 lb 13.7 oz 185 lb  Weight (kg) 75.751 kg 77.5 kg 83.915 kg     Body mass index is 22.03 kg/m.  General:  Well nourished, well developed, in no acute distress, very pleasant HEENT: Grossly normal Neck: no JVD Cardiac:  normal S1, S2; RRR; no murmur, rubs or gallops Lungs:  clear to auscultation bilaterally, no wheezing, rhonchi or rales  Abd: soft, nontender, no hepatomegaly  Ext: no peripheral edema Musculoskeletal:  No gross deformities Skin: warm and dry  Neuro: Very hard of hearing, otherwise grossly normal Psych:  Normal affect    EKG:  The ECG that was done today was personally reviewed and demonstrates NSR and LAFB without acute ischemic changes.    Relevant CV Studies:  TTE 04/26/20:  IMPRESSIONS     1. Left ventricular ejection fraction, by estimation, is 65 to 70%. The  left ventricle has normal function. The  left ventricle has no regional  wall motion abnormalities. There is mild left ventricular hypertrophy.  Left ventricular diastolic parameters  are consistent with Grade I diastolic dysfunction (impaired relaxation).   2. Right ventricular systolic function is mildly reduced. The right  ventricular size is normal. Tricuspid regurgitation signal is inadequate  for assessing PA pressure.   3. The mitral valve is normal in structure. No evidence of mitral valve  regurgitation. No evidence of mitral stenosis.   4. The aortic valve is grossly normal. There is mild calcification of the  aortic valve. Aortic valve regurgitation is not visualized. No aortic  stenosis is present.   5. Aortic dilatation noted. There is mild dilatation of the aortic root,  measuring 42 mm.   LHC 04/26/20:  RPDA lesion is 75% stenosed. 1st Mrg lesion is 75% stenosed. 2nd Mrg lesion is 99% stenosed. These three lesions are in small branch vessels. 1st Diag lesion is 50% stenosed. LV end diastolic pressure is normal. There is no aortic valve stenosis.   Diffuse small vessel disease in the distal PDA and small OM branches.  Main vessels are patent. I suspect that he had demand ischemia from HTN in the setting of small vessel disease.   Given that he has been pain free for some time, we opted for medical therapy.   Laboratory Data:  High Sensitivity Troponin:   Recent Labs  Lab 10/11/20 1637 10/11/20 1910  TROPONINIHS 8 9      Chemistry Recent Labs  Lab 10/11/20 1637  NA 138  K 3.9  CL 103  CO2 27  GLUCOSE 148*  BUN 19  CREATININE 1.57*  CALCIUM 9.6  GFRNONAA 43*  ANIONGAP 8    No results for input(s): PROT, ALBUMIN, AST, ALT, ALKPHOS, BILITOT in the last 168 hours. Hematology Recent Labs  Lab 10/11/20 1637  WBC 5.2  RBC 4.77  HGB 12.6*  HCT 38.5*  MCV 80.7  MCH 26.4  MCHC 32.7  RDW 15.0  PLT 197   BNPNo results for input(s):  BNP, PROBNP in the last 168 hours.  DDimer No results for  input(s): DDIMER in the last 168 hours.   Radiology/Studies:  DG Chest 2 View  Result Date: 10/11/2020 CLINICAL DATA:  Chest pain. EXAM: CHEST - 2 VIEW COMPARISON:  04/25/2020 FINDINGS: The heart is normal in size. Stable mediastinal contours. Similar aortic tortuosity. The lungs are clear. Pulmonary vasculature is normal. No consolidation, pleural effusion, or pneumothorax. No acute osseous abnormalities are seen. IMPRESSION: No acute chest findings. Electronically Signed   By: Narda Rutherford M.D.   On: 10/11/2020 18:26     Assessment and Plan:   Juan Goodwin is a 85 y.o. male with multivessel CAD c/b NSTEMI (04/2020), HTN, HLD, CKD 3, remote SDH 2/2, remote GI bleed 2/2 NSAID use (2016), and ability who is being seen 10/11/2020 for the evaluation of chest pain.   #Cardiac Chest Pain #Small Multivessel CAD #Prior NSTEMI #HLD :: Patient presents with 2 days of intermittent chest pain that has become more frequent over the last 2 days.  I have no doubt that his chest pain is cardiac in etiology given his classic description.  He is not currently having ACS however given his absence of chest pain currently, flat troponins over 3 hours, and EKG that is devoid of ischemic changes.  The question here is with this patient who is having cardiac chest pain benefit from another left heart cath after he just had one less than 6 months ago?  I think potentially he could require another LHC; however, given the fact that there was no clear intervene of the lesion less than 6 months ago, makes it reasonably unlikely that he will developed a new lesion that is amenable to stenting since that time.  Plus he has CKD 3 (albeit not the worst that I seen) that makes contrast administration not trivial.  I think a reasonable course of action is to perform a stress test on this patient.  If the stress test is floridly positive and shows ischemic changes in a coronary distribution, then pursuing a repeat catheterization  will be reasonable.  If it does not show significant ischemia then it may be reasonable to just treat him with antianginals for his small vessel CAD.  Will admit to cardiology and discussed this plan further with the day team. -s/p 324 ASA -Order NM SPECT stress test to rule out notable reversible ischemia -Can consider LHC depending on results of stress test -Continue ASA 81 mg daily -Continue rosuvastatin 40 mg daily -Continue Zetia 10 mg daily -Increase home Coreg 6.25 mg twice daily -Start Imdur 15 mg daily as an antianginal -Keep n.p.o. at midnight -Maintain telemetry -Check lipid panel -Check TSH  #HTN :: BP very elevated today in the setting of not taking his BP medications today.  Per the patient's daughter his BPs are well controlled at home, but I am a bit skeptical.  We will make changes to the blood pressure medications as detailed above for now and consider uptitrating beta-blockade even further. -Increase Coreg as above -Continue home amlodipine 10 mg daily -Start Imdur as above -Start hydralazine 10 mg every 8 hour prn for SBP greater than 180   Risk Assessment/Risk Scores:    HEAR Score (for undifferentiated chest pain):  5       Severity of Illness: The appropriate patient status for this patient is OBSERVATION. Observation status is judged to be reasonable and necessary in order to provide the required intensity of service to ensure the  patient's safety. The patient's presenting symptoms, physical exam findings, and initial radiographic and laboratory data in the context of their medical condition is felt to place them at decreased risk for further clinical deterioration. Furthermore, it is anticipated that the patient will be medically stable for discharge from the hospital within 2 midnights of admission. The following factors support the patient status of observation.   " The patient's presenting symptoms include chest pain that has resolved thus far. " The  physical exam findings include unremarkable. " The initial radiographic and laboratory data are WNL.   For questions or updates, please contact [facility] Please consult [email or web address] for contact info under     Signed, Karl Ito, MD  10/11/2020 11:09 PM

## 2020-10-11 NOTE — ED Provider Notes (Signed)
[facility] EM[facility]ENCY [facility] Provider Note   CSN: [id number] Arrival date & time: 10/11/20  1515     History Chief Complaint  Patient presents with   Chest Pain    Katherine MantleJimmy L Goodwin is a 85 y.o. male.  85 yo M with a chief complaint of chest pain.  This feels just like when he had a heart attack back in February.  Patient states that it started couple days ago.  Has occurred every time he is try to get up and move around.  He has been taking nitroglycerin fairly frequently and seems to resolve his pain.  Not having any pain currently.  He saw his cardiologist in the office today and she was concerned that this was typical angina and suggested he come to the ED for evaluation.  The history is provided by the patient and a relative.  Chest Pain Pain location:  Substernal area Pain quality: pressure   Pain radiates to:  Does not radiate Pain severity:  Moderate Onset quality:  Gradual Duration:  2 days Timing:  Intermittent Progression:  Waxing and waning Chronicity:  New Relieved by:  Nitroglycerin and rest Worsened by:  Exertion Ineffective treatments:  None tried Associated symptoms: shortness of breath   Associated symptoms: no abdominal pain, no fever, no headache, no palpitations and no vomiting       Past Medical History:  Diagnosis Date   Acute pyloric channel ulcer 07/13/2014   Acute upper GI bleed 07/13/2014   Cerebrovascular disease 10/08/2018   Chronic kidney disease (CKD), stage III (moderate) (HCC) 03/30/2017   Gastrointestinal hemorrhage with melena 06/2014   Global aphasia 03/27/2017   Helicobacter pylori gastritis 07/13/2014   High cholesterol    Hypernatremia 07/13/2014   Hypertension    Hypokalemia 07/13/2014   Normocytic normochromic anemia 04/10/2017   REM sleep behavior disorder 10/08/2018   Subdural hematoma (HCC) 03/26/2017   Thrombocytopenia (HCC) 07/13/2014    Patient Active Problem List   Diagnosis Date Noted   Angina at rest [facility](HCC)  10/11/2020   Elevated troponin    Chest pain 04/25/2020   NSTEMI (non-ST elevated myocardial infarction) (HCC) 04/25/2020   Hyperlipidemia 08/08/2019   Cerebrovascular disease 10/08/2018   REM sleep behavior disorder 10/08/2018   Weakness 04/10/2017   Dehydration 04/10/2017   Acute kidney injury superimposed on chronic kidney disease (HCC) 04/10/2017   Normocytic normochromic anemia 04/10/2017   Chronic kidney disease (CKD), stage III (moderate) (HCC) 03/30/2017   HTN (hypertension), malignant 03/29/2017   Orthostatic hypotension 03/27/2017   Global aphasia 03/27/2017   Subdural hematoma (HCC) 03/26/2017   Acute pyloric channel ulcer 07/13/2014   Hypokalemia 07/13/2014   Acute upper GI bleed 07/13/2014   ARF (acute renal failure) (HCC) 07/13/2014   Hypernatremia 07/13/2014   Hypotension 07/13/2014   Thrombocytopenia (HCC) 07/13/2014   Helicobacter pylori gastritis 07/13/2014   Gastrointestinal hemorrhage with melena    Melena 07/11/2014   GI bleed 07/11/2014   Acute blood loss anemia 07/10/2014    Past Surgical History:  Procedure Laterality Date   BRAIN SURGERY     CRANIOTOMY Left 03/26/2017   Procedure: CRANIOTOMY HEMATOMA EVACUATION SUBDURAL;  Surgeon: Donalee Citrinram, Gary, MD;  Location[facility] OR;  Service: Neurosurgery;  Laterality: Left;   ESOPHAGOGASTRODUODENOSCOPY N/A 07/11/2014   Procedure: ESOPHAGOGASTRODUODENOSCOPY (EGD);  Surgeon: Charlott RakesVincent Schooler, MD;  Location: [facility];  Service: Endoscopy;  Laterality: N/A;   LEFT HEART CATH AND CORONARY ANGIOGRAPHY N/A 04/26/2020   Procedure: LEFT HEART CATH AND CORONARY ANGIOGRAPHY;  Surgeon: Corky Crafts, MD;  Location: [facility] INVASIVE CV LAB;  Service: Cardiovascular;  Laterality: N/A;       Family History  Problem Relation Age of Onset   Heart failure Mother     Social History   Tobacco Use   Smoking status: Former    Packs/day: 1.00    Years: 35.00    Pack years: 35.00    Types: Cigarettes   Smokeless tobacco:  Never  Substance Use Topics   Alcohol use: No   Drug use: Not Currently    Home Medications Prior to Admission medications   Medication Sig Start Date End Date Taking? Authorizing Provider  amLODipine (NORVASC) 10 MG tablet Take 1 tablet (10 mg total) by mouth daily. 05/10/20  Yes Quintella Reichert, MD  aspirin 81 MG EC tablet Take 1 tablet (81 mg total) by mouth daily. Swallow whole. 05/10/20  Yes Quintella Reichert, MD  carvedilol (COREG) 3.125 MG tablet Take 1 tablet (3.125 mg total) by mouth 2 (two) times daily with a meal. 05/10/20   Turner, Cornelious Bryant, MD  ezetimibe (ZETIA) 10 MG tablet Take 1 tablet (10 mg total) by mouth daily. 05/10/20   Quintella Reichert, MD  Multiple Vitamins-Minerals (MULTIVITAMIN ADULTS 50+ PO) Take 1 tablet by mouth daily.    [provider]  nitroGLYCERIN (NITROSTAT) 0.4 MG SL tablet Place 0.4 mg under the tongue every 5 (five) minutes as needed for chest pain.    [provider]  paricalcitol (ZEMPLAR) 1 MCG capsule Take 1 mcg by mouth daily. 05/26/14   [provider]  rosuvastatin (CRESTOR) 40 MG tablet Take 1 tablet (40 mg total) by mouth daily. 05/10/20   Quintella Reichert, MD    Allergies    Doxazosin mesylate  Review of Systems   Review of Systems  Constitutional:  Negative for chills and fever.  HENT:  Negative for congestion and facial swelling.   Eyes:  Negative for discharge and visual disturbance.  Respiratory:  Positive for shortness of breath.   Cardiovascular:  Positive for chest pain. Negative for palpitations.  Gastrointestinal:  Negative for abdominal pain, diarrhea and vomiting.  Musculoskeletal:  Negative for arthralgias and myalgias.  Skin:  Negative for color change and rash.  Neurological:  Negative for tremors, syncope and headaches.  Psychiatric/Behavioral:  Negative for confusion and dysphoric mood.    Physical Exam Updated Vital Signs BP (!) 193/120   Pulse 77   Temp 98.7 F (37.1 C) (Oral)   Resp 19   Ht 6'  1" (1.854 m)   Wt 75.8 kg   SpO2 100%   BMI 22.03 kg/m   Physical Exam Vitals and nursing note reviewed.  Constitutional:      Appearance: He is well-developed.  HENT:     Head: Normocephalic and atraumatic.  Eyes:     Pupils: Pupils are equal, round, and reactive to light.  Neck:     Vascular: No JVD.  Cardiovascular:     Rate and Rhythm: Normal rate and regular rhythm.     Heart sounds: No murmur heard.   No friction rub. No gallop.  Pulmonary:     Effort: No respiratory distress.     Breath sounds: No wheezing.  Abdominal:     General: There is no distension.     Tenderness: There is no abdominal tenderness. There is no guarding or rebound.  Musculoskeletal:        General: Normal range of motion.  Cervical back: Normal range of motion and neck supple.  Skin:    Coloration: Skin is not pale.     Findings: No rash.  Neurological:     Mental Status: He is alert and oriented to person, place, and time.  Psychiatric:        Behavior: Behavior normal.    ED Results / Procedures / Treatments   Labs (all labs ordered are listed, but only abnormal results are displayed) Labs Reviewed  BASIC METABOLIC PANEL - Abnormal; Notable for the following components:      Result Value   Glucose, Bld 148 (*)    Creatinine, Ser 1.57 (*)    GFR, Estimated 43 (*)    All other components within normal limits  CBC WITH DIFFERENTIAL/PLATELET - Abnormal; Notable for the following components:   Hemoglobin 12.6 (*)    HCT 38.5 (*)    All other components within normal limits  TROPONIN I (HIGH SENSITIVITY)  TROPONIN I (HIGH SENSITIVITY)    EKG EKG Interpretation  Date/Time:  Monday October 11 2020 15:26:07 EDT Ventricular Rate:  96 PR Interval:  170 QRS Duration: 96 QT Interval:  362 QTC Calculation: 457 R Axis:   -57 Text Interpretation: Normal sinus rhythm Left anterior fascicular block Minimal voltage criteria for LVH, may be normal variant ( Cornell product ) Septal  infarct , age undetermined Abnormal ECG No significant change since last tracing Confirmed by Melene Plan [id number]) on 10/11/2020 8:02:07 PM  Radiology DG Chest 2 View  Result Date: 10/11/2020 CLINICAL DATA:  Chest pain. EXAM: CHEST - 2 VIEW COMPARISON:  04/25/2020 FINDINGS: The heart is normal in size. Stable mediastinal contours. Similar aortic tortuosity. The lungs are clear. Pulmonary vasculature is normal. No consolidation, pleural effusion, or pneumothorax. No acute osseous abnormalities are seen. IMPRESSION: No acute chest findings. Electronically Signed   By: Narda Rutherford M.D.   On: 10/11/2020 18:26    Procedures Procedures   Medications Ordered in ED Medications  aspirin chewable tablet 324 mg (has no administration in time range)    Or  aspirin suppository 300 mg (has no administration in time range)  aspirin EC tablet 81 mg (has no administration in time range)  nitroGLYCERIN (NITROSTAT) SL tablet 0.4 mg (has no administration in time range)  acetaminophen (TYLENOL) tablet 650 mg (has no administration in time range)  heparin injection 5,000 Units (has no administration in time range)  ezetimibe (ZETIA) tablet 10 mg (has no administration in time range)  rosuvastatin (CRESTOR) tablet 40 mg (has no administration in time range)  paricalcitol (ZEMPLAR) capsule 1 mcg (has no administration in time range)  amLODipine (NORVASC) tablet 10 mg (has no administration in time range)  carvedilol (COREG) tablet 3.125 mg (has no administration in time range)    ED Course  I have reviewed the triage vital signs and the nursing notes.  Pertinent labs & imaging results that were available during my care of the patient were reviewed by me and considered in my medical decision making (see chart for details).    MDM Rules/Calculators/A&P                           85 yo M with a chief complaint of chest pain.  Feels just like when he had a heart attack back a couple months ago.  Occurs on  exertion and resolves with rest and nitroglycerin.  Saw his cardiologist in the office was concerned about his history  and sent him here for evaluation.  2 troponins are negative EKG without concerning finding.  I discussed the case with the cardiology fellow, Dr. Lendell Caprice, who will come and evaluate the patient at bedside.  Will admit the patient.  CRITICAL CARE Performed by: Rae Roam   Total critical care time: 35s minutes  Critical care time was exclusive of separately billable procedures and treating other patients.  Critical care was necessary to treat or prevent imminent or life-threatening deterioration.  Critical care was time spent personally by me on the following activities: development of treatment plan with patient and/or surrogate as well as nursing, discussions with consultants, evaluation of patient's response to treatment, examination of patient, obtaining history from patient or surrogate, ordering and performing treatments and interventions, ordering and review of laboratory studies, ordering and review of radiographic studies, pulse oximetry and re-evaluation of patient's condition.   Final Clinical Impression(s) / ED Diagnoses Final diagnoses:  Unstable angina [facility])    Rx / DC Orders ED Discharge Orders     None        Melene Plan, DO 10/11/20 2253

## 2020-10-12 ENCOUNTER — Observation Stay (HOSPITAL_COMMUNITY): Payer: Medicare HMO

## 2020-10-12 DIAGNOSIS — I208 Other forms of angina pectoris: Secondary | ICD-10-CM

## 2020-10-12 DIAGNOSIS — I2510 Atherosclerotic heart disease of native coronary artery without angina pectoris: Secondary | ICD-10-CM

## 2020-10-12 DIAGNOSIS — R079 Chest pain, unspecified: Secondary | ICD-10-CM | POA: Diagnosis not present

## 2020-10-12 LAB — NM MYOCAR MULTI W/SPECT W/WALL MOTION / EF
Estimated workload: 1 METS
Exercise duration (min): 0 min
Exercise duration (sec): 0 s
MPHR: 137 {beats}/min
Peak HR: 87 {beats}/min
Percent HR: 63 %
Rest HR: 74 {beats}/min

## 2020-10-12 LAB — PROTIME-INR
INR: 1.1 (ref 0.8–1.2)
Prothrombin Time: 14.6 seconds (ref 11.4–15.2)

## 2020-10-12 LAB — LIPID PANEL
Cholesterol: 114 mg/dL (ref 0–200)
HDL: 32 mg/dL — ABNORMAL LOW (ref >40)
LDL Cholesterol: 67 mg/dL (ref 0–99)
Total CHOL/HDL Ratio: 3.6 RATIO
Triglycerides: 77 mg/dL (ref <150)
VLDL: 15 mg/dL (ref 0–40)

## 2020-10-12 LAB — BASIC METABOLIC PANEL
Anion gap: 9 (ref 5–15)
BUN: 18 mg/dL (ref 8–23)
CO2: 26 mmol/L (ref 22–32)
Calcium: 9.6 mg/dL (ref 8.9–10.3)
Chloride: 102 mmol/L (ref 98–111)
Creatinine, Ser: 1.42 mg/dL — ABNORMAL HIGH (ref 0.61–1.24)
GFR, Estimated: 49 mL/min — ABNORMAL LOW (ref >60)
Glucose, Bld: 108 mg/dL — ABNORMAL HIGH (ref 70–99)
Potassium: 4.3 mmol/L (ref 3.5–5.1)
Sodium: 137 mmol/L (ref 135–145)

## 2020-10-12 LAB — CBC
HCT: 34.1 % — ABNORMAL LOW (ref 39.0–52.0)
Hemoglobin: 11.1 g/dL — ABNORMAL LOW (ref 13.0–17.0)
MCH: 26.6 pg (ref 26.0–34.0)
MCHC: 32.6 g/dL (ref 30.0–36.0)
MCV: 81.6 fL (ref 80.0–100.0)
Platelets: 174 10*3/uL (ref 150–400)
RBC: 4.18 MIL/uL — ABNORMAL LOW (ref 4.22–5.81)
RDW: 15.3 % (ref 11.5–15.5)
WBC: 4.6 10*3/uL (ref 4.0–10.5)
nRBC: 0 % (ref 0.0–0.2)

## 2020-10-12 LAB — SARS CORONAVIRUS 2 (TAT 6-24 HRS): SARS Coronavirus 2: NEGATIVE

## 2020-10-12 LAB — TSH: TSH: 2.668 u[IU]/mL (ref 0.350–4.500)

## 2020-10-12 LAB — APTT: aPTT: 34 seconds (ref 24–36)

## 2020-10-12 MED ORDER — REGADENOSON 0.4 MG/5ML IV SOLN
0.4000 mg | Freq: Once | INTRAVENOUS | Status: AC
  Administered 2020-10-12: 0.4 mg via INTRAVENOUS
  Filled 2020-10-12: qty 5

## 2020-10-12 MED ORDER — CARVEDILOL 3.125 MG PO TABS: 6.2500 mg | ORAL_TABLET | Freq: Two times a day (BID) | ORAL | Status: DC

## 2020-10-12 MED ORDER — REGADENOSON 0.4 MG/5ML IV SOLN
INTRAVENOUS | Status: AC
  Filled 2020-10-12: qty 5

## 2020-10-12 MED ORDER — ISOSORBIDE MONONITRATE ER 30 MG PO TB24: 15.0000 mg | ORAL_TABLET | Freq: Every day | ORAL | 1 refills | Status: DC

## 2020-10-12 MED ORDER — NITROGLYCERIN 0.4 MG SL SUBL: 0.4000 mg | SUBLINGUAL_TABLET | SUBLINGUAL | 1 refills | Status: DC | PRN

## 2020-10-12 MED ORDER — TECHNETIUM TC 99M TETROFOSMIN IV KIT
10.3000 | PACK | Freq: Once | INTRAVENOUS | Status: AC | PRN
  Administered 2020-10-12: 10.3 via INTRAVENOUS

## 2020-10-12 MED ORDER — CARVEDILOL 6.25 MG PO TABS: 6.2500 mg | ORAL_TABLET | Freq: Two times a day (BID) | ORAL | 1 refills | Status: DC

## 2020-10-12 NOTE — ED Notes (Signed)
Patient denies pain and is resting comfortably.  

## 2020-10-12 NOTE — Discharge Summary (Addendum)
Discharge Summary    Patient ID: Juan Goodwin MRN: [id number]; DOB: 12/01/1935  Admit date: 10/11/2020 Discharge date: 10/12/2020  PCP:  Daisy Floro, MD   [facility] Providers Cardiologist:  Armanda Magic, MD   {   Discharge Diagnoses    Principal Problem:   Angina at rest [facility] Active Problems:   HTN (hypertension), malignant   Chronic kidney disease (CKD), stage III (moderate) (HCC)   Hyperlipidemia   CAD (coronary artery disease)    Diagnostic Studies/Procedures    NM stress Myoview 10/12/20:  Official report is pending, per Dr. Elease Hashimoto, no significant ischemia found  _____________   History of Present Illness     Per admission H&P:  Juan Goodwin is a 85 y.o. male with multivessel CAD c/b NSTEMI (04/2020), HTN, HLD, CKD 3, remote SDH 2/2, remote GI bleed 2/2 NSAID use (2016), who was on seen 10/11/2020 by cardiology for the evaluation of chest pain.   Following history was obtained from the patient and his daughter.  The patient is not a good historian.   The patient reported that on Saturday 10/10/20 upon waking up in the morning developed left-sided chest pain.  He said that this pain was severe 8/10 and occurred at rest that is " similar to the pain he had when he had a heart attack in February."  It was associated with bilateral shoulder pain as well.  Nothing made the pain worse.  He took a sublingual nitroglycerin which completely resolved the pain.  Since Saturday 10/09/20, he has had intermittent episodes of chest pain every 10 hours, each time the pain being relieved by a nitroglycerin tablet.  10/12/20 morning however, the chest pain started to become more frequent occurring every 2 hours requiring 3 sublingual nitroglycerin this morning.  Given his chest pain the patient visited his PCP Dr. Tenny Craw who instructed the patient to come to the ED for evaluation.  The patient denied having any fever, chills, diaphoresis, SOB, nausea, vomiting, abdominal pain,  weakness, numbness, swelling, PND, orthopnea, syncope, or palpitations.   Of note, the patient was admitted to [facility] in February of this year for an NSTEMI in the setting of elevated BPs.  He reports that his chest pain today is similar to the chest pain that he had back in February; however, he did not have SOB this time.  In February he underwent LHC which showed diffuse small vessel disease in the distal PDA and small OM branches -none of which were amenable to PCI.  Medical management was therefore pursued.  Since that  time, the patient has not had any chest pain and has not needed to use any nitroglycerin until now.   In the ED the patient's VS were afebrile, HR 94, BP 168/122 (did not take his BP medications today but normally SBP less than 130 per daughter's report), RR 18 and satting 100% on room air.  Labs notable for troponin 8 at baseline and 9 at 3 hours, and serum creatinine 1.57.  CXR was negative for pulmonary infiltrates or edema.  EKG was negative for ST segment changes or T wave inversions.  In the ED he was loaded with ASA and cardiology was consulted for evaluation of chest pain.    Hospital Course     Consultants: N/A   Chest pain, typical  CAD with small vessel disease - presented with 2 days of intermittent chest pain that has become more frequent over the last 2 days - Hs trop negative x2  -  EKG without acute ischemic changes - 04/26/2020 LHC showed diffuse small vessel disease in the distal PDA and small OM branches.  Main vessels are patent.  - LDL 67 at goal - TSH WNL - NM stress Myoview today showed no significant ischemia per Dr. Elease Hashimoto - Medical therapy: continue ASA 81mg , Crestor 40mg  , Zetia 10mg , Increased Coreg 6.125mg  BID due to HTN and possible angina, started Imur 15mg  for possible angina - Follow up with cardiology on 10/25/20 has been arranged   Hypertensive urgency  - BP elevated up to 205/123 at arrival yesterday  - no apparent acute end organ dysfunction   - chest pain may be due to uncontrolled HTN - Coreg increased to 6.125mg  BID and Imdur 15mg  started, amlodipine 10mg  continued, BP improved today  - advised patient monitor BP daily at home, follow up at the office for further adjustment if needed   HLD - LDL 67 at goal, continue crestor 40mg  , Zetia 10mg    CKD III - renal index near baseline     Did the patient have an acute coronary syndrome (MI, NSTEMI, STEMI, etc) this admission?:  No                               Did the patient have a percutaneous coronary intervention (stent / angioplasty)?:  No.       _____________  Discharge Vitals Blood pressure (!) 152/95, pulse 71, temperature 98.7 F (37.1 C), temperature source Oral, resp. rate 17, height 6\' 1"  (1.854 m), weight 75.8 kg, SpO2 98 %.  Filed Weights   10/11/20 1533  Weight: 75.8 kg    Labs & Radiologic Studies    CBC Recent Labs    10/11/20 1637 10/12/20 0457  WBC 5.2 4.6  NEUTROABS 3.0  --   HGB 12.6* 11.1*  HCT 38.5* 34.1*  MCV 80.7 81.6  PLT 197 174   Basic Metabolic Panel Recent Labs    10/27/20 1637 10/12/20 0457  NA 138 137  K 3.9 4.3  CL 103 102  CO2 27 26  GLUCOSE 148* 108*  BUN 19 18  CREATININE 1.57* 1.42*  CALCIUM 9.6 9.6   Liver Function Tests No results for input(s): AST, ALT, ALKPHOS, BILITOT, PROT, ALBUMIN in the last 72 hours. No results for input(s): LIPASE, AMYLASE in the last 72 hours. High Sensitivity Troponin:   Recent Labs  Lab 10/11/20 1637 10/11/20 1910  TROPONINIHS 8 9    BNP Invalid input(s): POCBNP D-Dimer No results for input(s): DDIMER in the last 72 hours. Hemoglobin A1C No results for input(s): HGBA1C in the last 72 hours. Fasting Lipid Panel Recent Labs    10/12/20 0457  CHOL 114  HDL 32*  LDLCALC 67  TRIG 77  CHOLHDL 3.6   Thyroid Function Tests Recent Labs    10/12/20 0457  TSH 2.668   _____________  DG Chest 2 View  Result Date: 10/11/2020 CLINICAL DATA:  Chest pain. EXAM: CHEST -  2 VIEW COMPARISON:  04/25/2020 FINDINGS: The heart is normal in size. Stable mediastinal contours. Similar aortic tortuosity. The lungs are clear. Pulmonary vasculature is normal. No consolidation, pleural effusion, or pneumothorax. No acute osseous abnormalities are seen. IMPRESSION: No acute chest findings. Electronically Signed   By: 12/12/20 M.D.   On: 10/11/2020 18:26   Disposition   Stress test result reviewed with the patient and family at bedside. Patient seen by MD earlier and OK for discharge  if stress negative. All questions answered at bedside to satisfaction.  Pt is being discharged home today in good condition. Advised patient to follow up with cardiology on 10/25/20 as scheduled.   Follow-up Plans & Appointments     Follow-up Information     Quintella Reichert, MD Follow up on 10/25/2020.   Specialty: Cardiology Why: please arrive 15 min early to 2:30PM for your post hospital cardiology follow up appointment Contact information: [address] [place] [address] [phone]               Follow up arranged on 10/25/20 with cardiology  Discharge Instructions     Diet - low sodium heart healthy   Complete by: As directed    Discharge instructions   Complete by: As directed    Please take your medication as prescribed, new medication include Imdur 15mg  daily, Coreg was increased to 6.125mg  BID, please pick up at your pharmacy, check your blood pressure daily at home and bring log to PCP, and follow up with cardiology on 10/25/20 as scheduled   Increase activity slowly   Complete by: As directed        Discharge Medications   Allergies as of 10/12/2020       Reactions   Doxazosin Mesylate Other (See Comments)        Medication List     TAKE these medications    amLODipine 10 MG tablet Commonly known as: NORVASC Take 1 tablet (10 mg total) by mouth daily.   aspirin 81 MG EC tablet Take 1 tablet (81 mg total) by mouth daily. Swallow  whole.   carvedilol 6.25 MG tablet Commonly known as: COREG Take 1 tablet (6.25 mg total) by mouth 2 (two) times daily with a meal. What changed:  medication strength how much to take Another medication with the same name was removed. Continue taking this medication, and follow the directions you see here.   ezetimibe 10 MG tablet Commonly known as: ZETIA Take 1 tablet (10 mg total) by mouth daily.   isosorbide mononitrate 30 MG 24 hr tablet Commonly known as: IMDUR Take 0.5 tablets (15 mg total) by mouth daily.   MULTIVITAMIN ADULTS 50+ PO Take 1 tablet by mouth daily.   nitroGLYCERIN 0.4 MG SL tablet Commonly known as: NITROSTAT Place 0.4 mg under the tongue every 5 (five) minutes as needed for chest pain.   paricalcitol 1 MCG capsule Commonly known as: ZEMPLAR Take 1 mcg by mouth daily.   rosuvastatin 40 MG tablet Commonly known as: CRESTOR Take 1 tablet (40 mg total) by mouth daily.           Outstanding Labs/Studies   N/A  Duration of Discharge Encounter   Greater than 30 minutes including physician time.    Signed, 12/12/2020, NP 10/12/2020, 4:29 PM

## 2020-10-12 NOTE — Progress Notes (Signed)
   [name] presented for a nuclear stress test today.  No immediate complications.  Stress imaging is pending at this time.  Preliminary EKG findings may be listed in the chart, but the stress test result will not be finalized until perfusion imaging is complete.  One day study, [facility] to read.  Beatriz Stallion, PA-C 10/12/2020, 12:39 PM

## 2020-10-12 NOTE — Progress Notes (Signed)
Progress Note  Patient Name: Juan Goodwin Date of Encounter: 10/12/2020  Primary Cardiologist:   Armanda Magic, MD   Subjective   No further chest pain  Inpatient Medications    Scheduled Meds:  amLODipine  10 mg Oral Daily   aspirin EC  81 mg Oral Daily   carvedilol  6.25 mg Oral BID WC   ezetimibe  10 mg Oral Daily   heparin  5,000 Units Subcutaneous Q8H   isosorbide mononitrate  15 mg Oral Daily   paricalcitol  1 mcg Oral Daily   rosuvastatin  40 mg Oral Daily   Continuous Infusions:  PRN Meds: acetaminophen, hydrALAZINE, nitroGLYCERIN   Vital Signs    Vitals:   10/12/20 0500 10/12/20 0530 10/12/20 0600 10/12/20 0630  BP: 121/86 (!) 126/97 (!) 143/93 124/84  Pulse: 81 72 72 73  Resp:  16    Temp:      TempSrc:      SpO2: 99% 99% 96% 98%  Weight:      Height:       No intake or output data in the 24 hours ending 10/12/20 0755 Filed Weights   10/11/20 1533  Weight: 75.8 kg    Telemetry    NSR - Personally Reviewed  ECG    NA - Personally Reviewed  Physical Exam   GEN: No acute distress.   Neck: No  JVD Cardiac: RRR, no murmurs, rubs, or gallops.  Respiratory: Clear to auscultation bilaterally. GI: Soft, nontender, non-distended  MS: No edema; No deformity. Neuro:  Nonfocal  Psych: Normal affect   Labs    Chemistry Recent Labs  Lab 10/11/20 1637 10/12/20 0457  NA 138 137  K 3.9 4.3  CL 103 102  CO2 27 26  GLUCOSE 148* 108*  BUN 19 18  CREATININE 1.57* 1.42*  CALCIUM 9.6 9.6  GFRNONAA 43* 49*  ANIONGAP 8 9     Hematology Recent Labs  Lab 10/11/20 1637 10/12/20 0457  WBC 5.2 4.6  RBC 4.77 4.18*  HGB 12.6* 11.1*  HCT 38.5* 34.1*  MCV 80.7 81.6  MCH 26.4 26.6  MCHC 32.7 32.6  RDW 15.0 15.3  PLT 197 174    Cardiac EnzymesNo results for input(s): TROPONINI in the last 168 hours. No results for input(s): TROPIPOC in the last 168 hours.   BNPNo results for input(s): BNP, PROBNP in the last 168 hours.   DDimer No  results for input(s): DDIMER in the last 168 hours.   Radiology    DG Chest 2 View  Result Date: 10/11/2020 CLINICAL DATA:  Chest pain. EXAM: CHEST - 2 VIEW COMPARISON:  04/25/2020 FINDINGS: The heart is normal in size. Stable mediastinal contours. Similar aortic tortuosity. The lungs are clear. Pulmonary vasculature is normal. No consolidation, pleural effusion, or pneumothorax. No acute osseous abnormalities are seen. IMPRESSION: No acute chest findings. Electronically Signed   By: Narda Rutherford M.D.   On: 10/11/2020 18:26    Cardiac Studies   CATH 04/26/20  Diagnostic Dominance: Right     Patient Profile     85 y.o. male with multivessel CAD c/b NSTEMI (04/2020), HTN, HLD, CKD 3, remote SDH 2/2, remote GI bleed 2/2 NSAID use (2016), and ability who is being seen 10/11/2020 for the evaluation of chest pain.    Assessment & Plan    CHEST PAIN:    He has known CAD and I did review his cath films from earlier this year.  He had small vessel disease.  Plan Lexiscan Myoview this morning and if not high risk then medical management.     HTN:     Coreg increased this admission.  On PRN hydralazine and Imdur is started.  BP is controlled this morning.     CKD IIIA:  Follow.  Creat is stable.   For questions or updates, please contact [facility] Please consult [email or web address] for contact info under Cardiology/STEMI.   Signed, Rollene Rotunda, MD  10/12/2020, 7:55 AM

## 2020-10-12 NOTE — ED Notes (Signed)
Pt transferred to NM 

## 2020-10-13 ENCOUNTER — Telehealth: Payer: Self-pay | Admitting: Cardiology

## 2020-10-13 NOTE — Telephone Encounter (Signed)
Spoke with the patient who gave me verbal permission to speak with [name]. She states that the patient has been having chest pain on and off for several days. He went to the ER on Monday night. They did blood work, EKG, and stress test which all came back normal. She states that the patient got home from the ER yesterday evening. This morning he had another episode of chest pain. No other associated symptoms. He is currently not having any chest pain. Currently his blood pressure is 132/73. The patient was started on Imdur in the ER. The daughter is going to pick up the medication now. Patient is scheduled for a follow up appointment with Dr. Mayford Knife on 8/15. Reassured patient's daughter of normal results from ER as well as advised on return to ER precautions. She verbalized understanding.

## 2020-10-13 NOTE — Telephone Encounter (Signed)
Pt c/o of Chest Pain: STAT if CP now or developed within 24 hours  1. Are you having CP right now?  Not currently, per patient's daughter  2. Are you experiencing any other symptoms (ex. SOB, nausea, vomiting, sweating)?  No   3. How long have you been experiencing CP?  About 1.5 weeks, per patient's daughter  4. Is your CP continuous or coming and going?  Coming and going   5. Have you taken Nitroglycerin?  Took 3 on 10/11/20, per patient's daughter ?

## 2020-10-25 ENCOUNTER — Ambulatory Visit: Payer: Medicare HMO | Admitting: Cardiology

## 2020-10-25 ENCOUNTER — Encounter: Payer: Self-pay | Admitting: Cardiology

## 2020-10-25 ENCOUNTER — Other Ambulatory Visit: Payer: Self-pay

## 2020-10-25 VITALS — BP 142/74 | HR 85 | Ht 73.5 in | Wt 162.0 lb

## 2020-10-25 DIAGNOSIS — E7800 Pure hypercholesterolemia, unspecified: Secondary | ICD-10-CM

## 2020-10-25 DIAGNOSIS — I77810 Thoracic aortic ectasia: Secondary | ICD-10-CM | POA: Diagnosis not present

## 2020-10-25 DIAGNOSIS — I2583 Coronary atherosclerosis due to lipid rich plaque: Secondary | ICD-10-CM | POA: Diagnosis not present

## 2020-10-25 DIAGNOSIS — I2510 Atherosclerotic heart disease of native coronary artery without angina pectoris: Secondary | ICD-10-CM

## 2020-10-25 DIAGNOSIS — I10 Essential (primary) hypertension: Secondary | ICD-10-CM

## 2020-10-25 DIAGNOSIS — R0600 Dyspnea, unspecified: Secondary | ICD-10-CM | POA: Diagnosis not present

## 2020-10-25 DIAGNOSIS — R0609 Other forms of dyspnea: Secondary | ICD-10-CM

## 2020-10-25 MED ORDER — ISOSORBIDE MONONITRATE ER 30 MG PO TB24: 30.0000 mg | ORAL_TABLET | Freq: Every day | ORAL | 3 refills | Status: DC

## 2020-10-25 NOTE — Patient Instructions (Addendum)
Medication Instructions:  Your physician has recommended you make the following change in your medication: 1) INCREASE Imdur (isosorbide mononitrate) to 30 mg daily   *If you need a refill on your cardiac medications before your next appointment, please call your pharmacy*   Testing/Procedures: Your physician has requested that you have an echocardiogram in February 2023. Echocardiography is a painless test that uses sound waves to create images of your heart. It provides your doctor with information about the size and shape of your heart and how well your heart's chambers and valves are working. This procedure takes approximately one hour. There are no restrictions for this procedure.  Your physician has requested that you wear a blood pressure monitor   Follow-Up: At [facility], you and your health needs are our priority.  As part of our continuing mission to provide you with exceptional heart care, we have created designated Provider Care Teams.  These Care Teams include your primary Cardiologist (physician) and Advanced Practice Providers (APPs -  Physician Assistants and Nurse Practitioners) who all work together to provide you with the care you need, when you need it.  Your next appointment:   4 week(s)  The format for your next appointment:   In Person  Provider:   Ronie Spies, PA-C

## 2020-10-25 NOTE — Progress Notes (Signed)
Cardiology  Note    Date:  10/25/2020   ID:  Juan Goodwin, DOB 10-19-1935, MRN [id number]  PCP:  Daisy Floro, MD  Cardiologist:  Armanda Magic, MD   Chief Complaint  Patient presents with   Follow-up    CAD, HTN, HLD, CP and dilated aortic root     History of Present Illness:  Juan Goodwin is a 85 y.o. male with a hx of CVA, UGIB, CKD stage 3, HTN, orthostatic hypotension, subdural hematoma in 2019 who is referred a year ago for evaluation of chest pain and SOB. He had no hx of cardiac dz but is a former smoker and quit 15 yrs ago.  His mother had a hx of CHF.  Nuclear stress test 08/2019 showed no ischemia.  He then presented with NSTEMI in 04/2020 in setting of hypertensive urgency and cath at that time showed diffuse small vessel disease in the dPDA 75% and small OM1 75% and OM2 99% branches and 50% pD1 which were not amenable to PCI and medical management was recommended.    He then presented to the ER on 10/12/2020 after awakening with acute left sided CP 8/10 on 7/31 similar to prior MI with radiation to his shoulders and took SL NTG which resolved his pain.  He continued to have intermittent CP relieved with SL NTG but the CP became more frequent and went to the ER.  BP was elevated at 1205/175mmHg but had not taken his BP meds that am.  hsTrop was 8 and 9.  WKG showed no ST changes.  LDL was at goal at 67 and nuclear stress test showed no signficant ischemia and he was continued on medical therapy for angina with an increase in carvedilol to 6.25mg  BID and Imdur 15mg  daily was added.  He is now here for further evaluation.    He is here today for followup.  Since his discharge from the hospital he has continued to have chest pain that is midsternal and occurs at rest.  He says that it is sharp and feels like he has a lot of gas that he cannot get out.  His daughter says that he eats and then goes and lays down and is VERY sedentary.  Any little thing makes him tired. He denies  any SOB, DOE, PND, orthopnea, LE edema, dizziness, palpitations or syncope. He is compliant with his meds and is tolerating meds with no SE.    Past Medical History:  Diagnosis Date   Acute pyloric channel ulcer 07/13/2014   Acute upper GI bleed 07/13/2014   Cerebrovascular disease 10/08/2018   Chronic kidney disease (CKD), stage III (moderate) (HCC) 03/30/2017   Gastrointestinal hemorrhage with melena 06/2014   Global aphasia 03/27/2017   Helicobacter pylori gastritis 07/13/2014   High cholesterol    Hypernatremia 07/13/2014   Hypertension    Hypokalemia 07/13/2014   Normocytic normochromic anemia 04/10/2017   REM sleep behavior disorder 10/08/2018   Subdural hematoma (HCC) 03/26/2017   Thrombocytopenia (HCC) 07/13/2014    Past Surgical History:  Procedure Laterality Date   BRAIN SURGERY     CRANIOTOMY Left 03/26/2017   Procedure: CRANIOTOMY HEMATOMA EVACUATION SUBDURAL;  Surgeon: 03/28/2017, MD;  Location: [facility] OR;  Service: Neurosurgery;  Laterality: Left;   ESOPHAGOGASTRODUODENOSCOPY N/A 07/11/2014   Procedure: ESOPHAGOGASTRODUODENOSCOPY (EGD);  Surgeon: 07/13/2014, MD;  Location[facility];  Service: Endoscopy;  Laterality: N/A;   LEFT HEART CATH AND CORONARY ANGIOGRAPHY N/A 04/26/2020   Procedure: LEFT HEART  CATH AND CORONARY ANGIOGRAPHY;  Surgeon: Corky Crafts, MD;  Location: [facility] INVASIVE CV LAB;  Service: Cardiovascular;  Laterality: N/A;    Current Medications: Current Meds  Medication Sig   amLODipine (NORVASC) 10 MG tablet Take 1 tablet (10 mg total) by mouth daily.   aspirin 81 MG EC tablet Take 1 tablet (81 mg total) by mouth daily. Swallow whole.   carvedilol (COREG) 6.25 MG tablet Take 1 tablet (6.25 mg total) by mouth 2 (two) times daily with a meal.   isosorbide mononitrate (IMDUR) 30 MG 24 hr tablet Take 0.5 tablets (15 mg total) by mouth daily.   Multiple Vitamins-Minerals (MULTIVITAMIN ADULTS 50+ PO) Take 1 tablet by mouth daily.   nitroGLYCERIN (NITROSTAT) 0.4 MG  SL tablet Place 0.4 mg under the tongue every 5 (five) minutes as needed for chest pain.   nitroGLYCERIN (NITROSTAT) 0.4 MG SL tablet Place 1 tablet (0.4 mg total) under the tongue every 5 (five) minutes as needed for chest pain.   paricalcitol (ZEMPLAR) 1 MCG capsule Take 1 mcg by mouth daily.   rosuvastatin (CRESTOR) 40 MG tablet Take 1 tablet (40 mg total) by mouth daily.    Allergies:   Doxazosin mesylate   Social History   Socioeconomic History   Marital status: Widowed    Spouse name: Not on file   Number of children: 3   Years of education: 14   Highest education level: Not on file  Occupational History   Not on file  Tobacco Use   Smoking status: Former    Packs/day: 1.00    Years: 35.00    Pack years: 35.00    Types: Cigarettes   Smokeless tobacco: Never  Substance and Sexual Activity   Alcohol use: No   Drug use: Not Currently   Sexual activity: Never  Other Topics Concern   Not on file  Social History Narrative   Right handed    Occasional cup of coffee    Lives at home alone   Wife is deceased along with 2 children 1 child is living     Social Determinants of Corporate investment banker Strain: Not on file  Food Insecurity: Not on file  Transportation Needs: Not on file  Physical Activity: Not on file  Stress: Not on file  Social Connections: Not on file     Family History:  The patient's family history includes Heart failure in his mother.   ROS:   Please see the history of present illness.    ROS All other systems reviewed and are negative.  No flowsheet data found.  PHYSICAL EXAM:   VS:  BP (!) 142/74   Pulse 85   Ht 6' 1.5" (1.867 m)   Wt 162 lb (73.5 kg)   SpO2 98%   BMI 21.08 kg/m    GEN: Well nourished, well developed in no acute distress HEENT: Normal NECK: No JVD; No carotid bruits LYMPHATICS: No lymphadenopathy CARDIAC:RRR, no murmurs, rubs, gallops RESPIRATORY:  Clear to auscultation without rales, wheezing or rhonchi   ABDOMEN: Soft, non-tender, non-distended MUSCULOSKELETAL:  No edema; No deformity  SKIN: Warm and dry NEUROLOGIC:  Alert and oriented x 3 PSYCHIATRIC:  Normal affect   Wt Readings from Last 3 Encounters:  10/25/20 162 lb (73.5 kg)  10/11/20 167 lb (75.8 kg)  04/25/20 170 lb 13.7 oz (77.5 kg)      Studies/Labs Reviewed:   EKG:  EKG is not ordered today.  The ekg ordered today demonstrates NSR  at 99bpm with no ST changes  Recent Labs: 04/25/2020: Magnesium 1.7 07/26/2020: ALT 9 10/12/2020: BUN 18; Creatinine, Ser 1.42; Hemoglobin 11.1; Platelets 174; Potassium 4.3; Sodium 137; TSH 2.668   Lipid Panel    Component Value Date/Time   CHOL 114 10/12/2020 0457   CHOL 120 07/26/2020 0902   TRIG 77 10/12/2020 0457   HDL 32 (L) 10/12/2020 0457   HDL 34 (L) 07/26/2020 0902   CHOLHDL 3.6 10/12/2020 0457   VLDL 15 10/12/2020 0457   LDLCALC 67 10/12/2020 0457   LDLCALC 70 07/26/2020 0902    Additional studies/ records that were reviewed today include:  Hospital notes, nuclear stress test and cardiac cath from 04/2020    ASSESSMENT:    1. Coronary artery disease due to lipid rich plaque   2. DOE (dyspnea on exertion)   3. HTN (hypertension), malignant   4. Pure hypercholesterolemia   5. Dilated aortic root (HCC)      PLAN:  In order of problems listed above:  ASCAD/Chest pain -s/p NSTEMI in setting of hypertensive urgency in Feb 2022>>cath with diffuse small vessel disease in the distal PDA and small OM branches -none of which were amenable to PCI. -readmitted with several days of crescendo angina in the setting of hypertensive urgency with neg hsTrop x 2 -nuclear stress test with no ischemia 10/2020 -EKG is nonischemic -Suspect that poorly controlled HTN is a lot of his problem with recurrent angina -Imdur was added and carvedilol was increased a few weeks ago -he is still having a chest pain but only occurs at rest and is very atypical>>he thinks it is gas -Continue  prescription drug management with ASA 81mg  daily, Carvedilol 6.25mg  BID, high dose statin and amlodipine -increase Imdur to 30mg  daily -need aggressive treatment of his HTN  -Add Protonix 40mg  daily in case this is GERD -encouraged him not to lay down after eating  2.  SOB -likely related to poorly controlled HTN -2D echo with normal LVF, mild LVH and G1DD -SOB has resolved  3.  HTN -BP improved but SBP borderline -continue prescription drug management with amlodipine 10mg  daily, Carvedilol 6.25mg  BID and increase Imdur to 30mg  daily with PRN refills -48 hour BP monitor  4.  HLD -LDL goal < 70 -LDL was 67 in Aug 2022 -continue Crestor 40mg  daily  5.  Dilated aortic root -noted on 2D echo at 28mm -repeat echo in Feb 2022 for followup>>avoid CTA due to CKD    Medication Adjustments/Labs and Tests Ordered: Current medicines are reviewed at length with the patient today.  Concerns regarding medicines are outlined above.  Medication changes, Labs and Tests ordered today are listed in the Patient Instructions below.  There are no Patient Instructions on file for this visit.   Signed, , MD  10/25/2020 2:48 PM    [facility] [address] [place], [place], Armanda Magic  10/27/2020 Phone: [phone]; Fax: [phone]

## 2020-10-25 NOTE — Addendum Note (Signed)
Addended by: Theresia Majors on: 10/25/2020 03:14 PM   Modules accepted: Orders

## 2020-11-08 ENCOUNTER — Telehealth: Payer: Self-pay | Admitting: Cardiology

## 2020-11-08 NOTE — Telephone Encounter (Signed)
Pt's daughter Hansel Starling is calling in regards to her father wearing a BP Monitor, pt states someone called the pt but pt erased the answering machine and he can not remember who called.  Please advise daughter further

## 2020-11-25 ENCOUNTER — Other Ambulatory Visit: Payer: Self-pay | Admitting: Cardiology

## 2020-11-25 DIAGNOSIS — I10 Essential (primary) hypertension: Secondary | ICD-10-CM

## 2020-11-25 DIAGNOSIS — R0600 Dyspnea, unspecified: Secondary | ICD-10-CM

## 2020-11-25 DIAGNOSIS — R030 Elevated blood-pressure reading, without diagnosis of hypertension: Secondary | ICD-10-CM

## 2020-11-25 DIAGNOSIS — R0609 Other forms of dyspnea: Secondary | ICD-10-CM

## 2020-11-25 NOTE — Telephone Encounter (Signed)
Patient scheduled to have 24 hour ambulatory blood pressure monitor applied Thursday, 12/09/2020, at 9:00 AM after his 8:15 AM appointment with Ronie Spies.

## 2020-11-29 DIAGNOSIS — Z23 Encounter for immunization: Secondary | ICD-10-CM | POA: Diagnosis not present

## 2020-11-29 DIAGNOSIS — I129 Hypertensive chronic kidney disease with stage 1 through stage 4 chronic kidney disease, or unspecified chronic kidney disease: Secondary | ICD-10-CM | POA: Diagnosis not present

## 2020-11-29 DIAGNOSIS — N2581 Secondary hyperparathyroidism of renal origin: Secondary | ICD-10-CM | POA: Diagnosis not present

## 2020-11-29 DIAGNOSIS — D631 Anemia in chronic kidney disease: Secondary | ICD-10-CM | POA: Diagnosis not present

## 2020-11-29 DIAGNOSIS — N1831 Chronic kidney disease, stage 3a: Secondary | ICD-10-CM | POA: Diagnosis not present

## 2020-12-07 ENCOUNTER — Encounter: Payer: Self-pay | Admitting: Physician Assistant

## 2020-12-07 NOTE — Progress Notes (Signed)
Cardiology Office Note    Date:  12/09/2020   ID:  AKHILESH SASSONE, DOB [birth year], MRN [id number]  PCP:  Daisy Floro, MD  Cardiologist:  Armanda Magic, MD  Electrophysiologist:  None   Chief Complaint: f/u CAD, HTN  History of Present Illness:   Juan Goodwin is a 85 y.o. male with history of CAD with NSTEMI 04/2020, CVA, UGIB, CKD stage 3a, HTN with orthostatic hypotension, HLD, subdural hematoma in 2019, prior GIB, former tobacco use, dilated aortic root who presents for follow-up.  He has previously followed for chest pain and SOB. He had a prior stress test in 2021 without ischemia. He had an NSTEMI 04/2020 in setting of hypertension with cath showing diffuse small vessel disease and stenosis not amenable to PCI (see cath below). 2D echo 04/2020 showed EF 65-70%, mild LVH, grade 1 DD, mildly reduced RV function, mild dilation of aortic root 42mm. He was readmitted 10/2020 with chest pain in the setting of elevated BP (had not taken BP meds that time). Nuclear stress test reported to show no significant ischemia and he was continued on medical therapy. At f/u OV with Dr. Mayford Knife he was still having intermittent chest discomfort that was reminiscent of gas pain as well as SOB. His Imdur was increased. Note indicated plan to add Protonix but do not see this was prescribed. 48 hour blood pressure monitor was also recommended, pending.  He is seen back for follow-up reporting he feels good. He has not had any further CP or SOB. No further gas pains. Not taking anything for reflux at this time. He offers no complaints today. Denies any sx of GIB. BP is normal.   Labwork independently reviewed: 10/2020 K 4.3, Cr 1.42, Hgb 11.1, TSH wnl, LDL 67 07/2020 ALT 9 04/2020 Mg 1.7 2019 albumin 3.1   Past Medical History:  Diagnosis Date   Acute pyloric channel ulcer 07/13/2014   Acute upper GI bleed 07/13/2014   CAD (coronary artery disease)    Cerebrovascular disease 10/08/2018   Chronic  kidney disease, stage 3a (HCC) 03/30/2017   Dilated aortic root (HCC)    Gastrointestinal hemorrhage with melena 06/2014   Global aphasia 03/27/2017   Helicobacter pylori gastritis 07/13/2014   High cholesterol    Hypernatremia 07/13/2014   Hypertension    Hypokalemia 07/13/2014   Normocytic normochromic anemia 04/10/2017   REM sleep behavior disorder 10/08/2018   Subdural hematoma (HCC) 03/26/2017   Thrombocytopenia (HCC) 07/13/2014    Past Surgical History:  Procedure Laterality Date   BRAIN SURGERY     CRANIOTOMY Left 03/26/2017   Procedure: CRANIOTOMY HEMATOMA EVACUATION SUBDURAL;  Surgeon: Donalee Citrin, MD;  Location: [facility] OR;  Service: Neurosurgery;  Laterality: Left;   ESOPHAGOGASTRODUODENOSCOPY N/A 07/11/2014   Procedure: ESOPHAGOGASTRODUODENOSCOPY (EGD);  Surgeon: Charlott Rakes, MD;  Location: [facility];  Service: Endoscopy;  Laterality: N/A;   LEFT HEART CATH AND CORONARY ANGIOGRAPHY N/A 04/26/2020   Procedure: LEFT HEART CATH AND CORONARY ANGIOGRAPHY;  Surgeon: Corky Crafts, MD;  Location: [facility] INVASIVE CV LAB;  Service: Cardiovascular;  Laterality: N/A;    Current Medications: Current Meds  Medication Sig   amLODipine (NORVASC) 10 MG tablet Take 1 tablet (10 mg total) by mouth daily.   aspirin 81 MG EC tablet Take 1 tablet (81 mg total) by mouth daily. Swallow whole.   carvedilol (COREG) 6.25 MG tablet Take 1 tablet (6.25 mg total) by mouth 2 (two) times daily with a meal.   famotidine (PEPCID) 20  MG tablet Take 20 mg by mouth daily.   isosorbide mononitrate (IMDUR) 30 MG 24 hr tablet Take 1 tablet (30 mg total) by mouth daily.   Multiple Vitamins-Minerals (MULTIVITAMIN ADULTS 50+ PO) Take 1 tablet by mouth daily.   nitroGLYCERIN (NITROSTAT) 0.4 MG SL tablet Place 1 tablet (0.4 mg total) under the tongue every 5 (five) minutes as needed for chest pain.   paricalcitol (ZEMPLAR) 1 MCG capsule Take 1 mcg by mouth daily.   rosuvastatin (CRESTOR) 40 MG tablet Take 1  tablet (40 mg total) by mouth daily.     Allergies:   Doxazosin mesylate   Social History   Socioeconomic History   Marital status: Widowed    Spouse name: Not on file   Number of children: 3   Years of education: 14   Highest education level: Not on file  Occupational History   Not on file  Tobacco Use   Smoking status: Former    Packs/day: 1.00    Years: 35.00    Pack years: 35.00    Types: Cigarettes   Smokeless tobacco: Never  Substance and Sexual Activity   Alcohol use: No   Drug use: Not Currently   Sexual activity: Never  Other Topics Concern   Not on file  Social History Narrative   Right handed    Occasional cup of coffee    Lives at home alone   Wife is deceased along with 2 children 1 child is living     Social Determinants of Corporate investment banker Strain: Not on file  Food Insecurity: Not on file  Transportation Needs: Not on file  Physical Activity: Not on file  Stress: Not on file  Social Connections: Not on file     Family History:  The patient's family history includes Heart failure in his mother.  ROS:   Please see the history of present illness.  All other systems are reviewed and otherwise negative.    EKGs/Labs/Other Studies Reviewed:    Studies reviewed are outlined and summarized above. Reports included below if pertinent.  Cardiac Cath 04/2020 RPDA lesion is 75% stenosed. 1st Mrg lesion is 75% stenosed. 2nd Mrg lesion is 99% stenosed. These three lesions are in small branch vessels. 1st Diag lesion is 50% stenosed. LV end diastolic pressure is normal. There is no aortic valve stenosis.   Diffuse small vessel disease in the distal PDA and small OM branches.  Main vessels are patent. I suspect that he had demand ischemia from HTN in the setting of small vessel disease.   Given that he has been pain free for some time, we opted for medical therapy.    Continue medical therapy and BP control.   2D echo 04/2020  1. Left  ventricular ejection fraction, by estimation, is 65 to 70%. The  left ventricle has normal function. The left ventricle has no regional  wall motion abnormalities. There is mild left ventricular hypertrophy.  Left ventricular diastolic parameters  are consistent with Grade I diastolic dysfunction (impaired relaxation).   2. Right ventricular systolic function is mildly reduced. The right  ventricular size is normal. Tricuspid regurgitation signal is inadequate  for assessing PA pressure.   3. The mitral valve is normal in structure. No evidence of mitral valve  regurgitation. No evidence of mitral stenosis.   4. The aortic valve is grossly normal. There is mild calcification of the  aortic valve. Aortic valve regurgitation is not visualized. No aortic  stenosis is present.  5. Aortic dilatation noted. There is mild dilatation of the aortic root,  measuring 42 mm.   NST 10/2020 Report not finalized but per report "NM stress Myoview today showed no significant ischemia per Dr. Elease Hashimoto"    EKG:  EKG is not ordered today  Recent Labs: 04/25/2020: Magnesium 1.7 07/26/2020: ALT 9 10/12/2020: BUN 18; Creatinine, Ser 1.42; Hemoglobin 11.1; Platelets 174; Potassium 4.3; Sodium 137; TSH 2.668  Recent Lipid Panel    Component Value Date/Time   CHOL 114 10/12/2020 0457   CHOL 120 07/26/2020 0902   TRIG 77 10/12/2020 0457   HDL 32 (L) 10/12/2020 0457   HDL 34 (L) 07/26/2020 0902   CHOLHDL 3.6 10/12/2020 0457   VLDL 15 10/12/2020 0457   LDLCALC 67 10/12/2020 0457   LDLCALC 70 07/26/2020 0902    PHYSICAL EXAM:    VS:  BP 122/80   Pulse 75   Ht 6' 1.5" (1.867 m)   Wt 164 lb 6.4 oz (74.6 kg)   SpO2 97%   BMI 21.40 kg/m   BMI: Body mass index is 21.4 kg/m.  GEN: Well nourished, well developed male in no acute distress HEENT: normocephalic, atraumatic Neck: no JVD, carotid bruits, or masses Cardiac: RRR; no murmurs, rubs, or gallops, no edema  Respiratory:  clear to auscultation  bilaterally, normal work of breathing GI: soft, nontender, nondistended, + BS MS: no deformity or atrophy Skin: warm and dry, no rash Neuro:  Alert and Oriented x 3, Strength and sensation are intact, follows commands Psych: euthymic mood, full affect  Wt Readings from Last 3 Encounters:  12/09/20 164 lb 6.4 oz (74.6 kg)  10/25/20 162 lb (73.5 kg)  10/11/20 167 lb (75.8 kg)     ASSESSMENT & PLAN:   1. CAD - chest pain resolved. Continue medical therapy with ASA, carvedilol, amlodipine, rosuvastatin, isosorbide. Discussed notifying us for recurrent sx. He is not sure if he is taking ezetimibe or not. He will look when he gets home and call us.  2. Essential HTN - BP is controlled this AM. Dr. Mayford Knife has suggested an ambulatory BP monitor. I think this is a good idea to assess for any episodic spikes/variation. These are only available for 24 hours, not 48 hours. We will proceed with order as planned.  3. Hyperlipidemia goal LDL <70 - lipids reviewed from 10/2020 and LDL at goal. Continue statin. As above, he will let us know if he is taking ezetimibe or not.  4. Dilated aortic root - Dr. Mayford Knife has suggested echocardiogram in 04/2021 and this has been ordered. Continue BP control.  5. CKD stage IIIa - renal function was near baseline when last checked 10/2020. Continue strict blood pressure control.  Disposition: F/u with Dr. Mayford Knife in 6 months.   Medication Adjustments/Labs and Tests Ordered: Current medicines are reviewed at length with the patient today.  Concerns regarding medicines are outlined above. Medication changes, Labs and Tests ordered today are summarized above and listed in the Patient Instructions accessible in Encounters.   Signed, Laurann Montana, PA-C  12/09/2020 8:05 AM    [facility] [address] [place], [place], [place]  [address] Phone: [phone]; Fax: [phone]

## 2020-12-09 ENCOUNTER — Telehealth: Payer: Self-pay | Admitting: Cardiology

## 2020-12-09 ENCOUNTER — Ambulatory Visit (INDEPENDENT_AMBULATORY_CARE_PROVIDER_SITE_OTHER): Payer: Medicare HMO

## 2020-12-09 ENCOUNTER — Encounter (INDEPENDENT_AMBULATORY_CARE_PROVIDER_SITE_OTHER): Payer: Self-pay

## 2020-12-09 ENCOUNTER — Encounter: Payer: Self-pay | Admitting: Physician Assistant

## 2020-12-09 ENCOUNTER — Other Ambulatory Visit: Payer: Self-pay

## 2020-12-09 ENCOUNTER — Ambulatory Visit: Payer: Medicare HMO | Admitting: Physician Assistant

## 2020-12-09 VITALS — BP 122/80 | HR 75 | Ht 73.5 in | Wt 164.4 lb

## 2020-12-09 DIAGNOSIS — N1831 Chronic kidney disease, stage 3a: Secondary | ICD-10-CM | POA: Diagnosis not present

## 2020-12-09 DIAGNOSIS — R030 Elevated blood-pressure reading, without diagnosis of hypertension: Secondary | ICD-10-CM | POA: Diagnosis not present

## 2020-12-09 DIAGNOSIS — I10 Essential (primary) hypertension: Secondary | ICD-10-CM | POA: Diagnosis not present

## 2020-12-09 DIAGNOSIS — E785 Hyperlipidemia, unspecified: Secondary | ICD-10-CM

## 2020-12-09 DIAGNOSIS — I77810 Thoracic aortic ectasia: Secondary | ICD-10-CM | POA: Diagnosis not present

## 2020-12-09 DIAGNOSIS — R0600 Dyspnea, unspecified: Secondary | ICD-10-CM

## 2020-12-09 DIAGNOSIS — I2510 Atherosclerotic heart disease of native coronary artery without angina pectoris: Secondary | ICD-10-CM | POA: Diagnosis not present

## 2020-12-09 DIAGNOSIS — R0609 Other forms of dyspnea: Secondary | ICD-10-CM

## 2020-12-09 NOTE — Telephone Encounter (Signed)
Spoke with the patient's daughter who states that the patient was not on zetia when labs were drawn in August. She states that she moved back in town in May and he was not taking it at that time. She is unsure why he stopped taking it or when. Advised that it is okay for him to remain off of zetia.

## 2020-12-09 NOTE — Telephone Encounter (Signed)
Pt had daughter call back.  He is no longer taking Zetia or Famotidine.

## 2020-12-09 NOTE — Telephone Encounter (Signed)
Routing call to Ronie Spies, PA, CMA, and Dr. Norris Cross primary RN due to office visit today 9/29.

## 2020-12-09 NOTE — Patient Instructions (Signed)
Medication Instructions:  ?Your physician recommends that you continue on your current medications as directed. Please refer to the Current Medication list given to you today. ? ?*If you need a refill on your cardiac medications before your next appointment, please call your pharmacy* ? ? ?Lab Work: ?None ordered ? ?If you have labs (blood work) drawn today and your tests are completely normal, you will receive your results only by: ?MyChart Message (if you have MyChart) OR ?A paper copy in the mail ?If you have any lab test that is abnormal or we need to change your treatment, we will call you to review the results. ? ? ?Testing/Procedures: ?None ordered ? ? ?Follow-Up: ?At [facility], you and your health needs are our priority.  As part of our continuing mission to provide you with exceptional heart care, we have created designated Provider Care Teams.  These Care Teams include your primary Cardiologist (physician) and Advanced Practice Providers (APPs -  Physician Assistants and Nurse Practitioners) who all work together to provide you with the care you need, when you need it. ? ?We recommend signing up for the patient portal called "MyChart".  Sign up information is provided on this After Visit Summary.  MyChart is used to connect with patients for Virtual Visits (Telemedicine).  Patients are able to view lab/test results, encounter notes, upcoming appointments, etc.  Non-urgent messages can be sent to your provider as well.   ?To learn more about what you can do with MyChart, go to [email or web address].   ? ?Your next appointment:   ?6 month(s) ? ?The format for your next appointment:   ?In Person ? ?Provider:   ?Traci Turner, MD   ? ? ?Other Instructions ? ?

## 2020-12-09 NOTE — Telephone Encounter (Signed)
Paitent's daughter called back to say the took medicaiton the patient is no longer taking is carvedilol (COREG) 6.25 MG tablet and famotidine (PEPCID) 20 MG tablet

## 2020-12-09 NOTE — Telephone Encounter (Signed)
Thanks. Can we find out why they stopped it, and if they know if he was taking it when labs were checked 10/12/20? (His LDL was well controlled at that time so we just need to hold course with what he was doing in August.)  - If he was off ezetimibe in 10/2020 when labs were checked, OK to continue off this medicine (but continue rosuvastatin) and continue usual follow-up.  - However, if they think he was taking ezetimibe when labs were checked in 10/2020 then stopped taking it for unclear reasons, would plan to resume at 10mg  daily as long as it wasn't stopped for an adverse reaction. Thx!

## 2021-04-27 ENCOUNTER — Ambulatory Visit (HOSPITAL_COMMUNITY): Payer: Medicare HMO | Attending: Internal Medicine

## 2021-04-27 ENCOUNTER — Other Ambulatory Visit: Payer: Self-pay

## 2021-04-27 ENCOUNTER — Encounter (INDEPENDENT_AMBULATORY_CARE_PROVIDER_SITE_OTHER): Payer: Self-pay

## 2021-04-27 DIAGNOSIS — I2510 Atherosclerotic heart disease of native coronary artery without angina pectoris: Secondary | ICD-10-CM | POA: Insufficient documentation

## 2021-04-27 DIAGNOSIS — I77810 Thoracic aortic ectasia: Secondary | ICD-10-CM | POA: Diagnosis not present

## 2021-04-27 DIAGNOSIS — I2583 Coronary atherosclerosis due to lipid rich plaque: Secondary | ICD-10-CM | POA: Insufficient documentation

## 2021-04-27 DIAGNOSIS — E7800 Pure hypercholesterolemia, unspecified: Secondary | ICD-10-CM | POA: Insufficient documentation

## 2021-04-27 DIAGNOSIS — R0609 Other forms of dyspnea: Secondary | ICD-10-CM | POA: Diagnosis not present

## 2021-04-27 LAB — ECHOCARDIOGRAM COMPLETE
Area-P 1/2: 2.75 cm2
S' Lateral: 2.1 cm

## 2021-04-28 ENCOUNTER — Encounter: Payer: Self-pay | Admitting: Cardiology

## 2021-05-03 DIAGNOSIS — E1122 Type 2 diabetes mellitus with diabetic chronic kidney disease: Secondary | ICD-10-CM | POA: Diagnosis not present

## 2021-05-03 DIAGNOSIS — R413 Other amnesia: Secondary | ICD-10-CM | POA: Diagnosis not present

## 2021-06-03 ENCOUNTER — Other Ambulatory Visit: Payer: Self-pay

## 2021-06-03 ENCOUNTER — Ambulatory Visit: Payer: Medicare HMO | Admitting: Cardiology

## 2021-06-03 ENCOUNTER — Encounter: Payer: Self-pay | Admitting: Cardiology

## 2021-06-03 VITALS — BP 106/62 | HR 84 | Ht 74.0 in | Wt 158.0 lb

## 2021-06-03 DIAGNOSIS — I2510 Atherosclerotic heart disease of native coronary artery without angina pectoris: Secondary | ICD-10-CM | POA: Diagnosis not present

## 2021-06-03 DIAGNOSIS — R0609 Other forms of dyspnea: Secondary | ICD-10-CM | POA: Diagnosis not present

## 2021-06-03 DIAGNOSIS — E7800 Pure hypercholesterolemia, unspecified: Secondary | ICD-10-CM | POA: Diagnosis not present

## 2021-06-03 DIAGNOSIS — I10 Essential (primary) hypertension: Secondary | ICD-10-CM | POA: Diagnosis not present

## 2021-06-03 NOTE — Patient Instructions (Signed)

## 2021-06-03 NOTE — Progress Notes (Signed)
? ?Cardiology  Note   ? ?Date:  06/03/2021  ? ?ID:  Juan Goodwin, DOB 07/08/1935, MRN [id number] ? ?PCP:  Lawerance Cruel, MD  ?Cardiologist:  Fransico Him, MD  ? ?Chief Complaint  ?Patient presents with  ? Coronary Artery Disease  ? Hypertension  ? Hyperlipidemia  ? ? ? ?History of Present Illness:  ?Juan Goodwin is a 86 y.o. male with a hx of CVA, UGIB, CKD stage 3, HTN, orthostatic hypotension, subdural hematoma in 2019 who is referred a year ago for evaluation of chest pain and SOB. He had no hx of cardiac dz but is a former smoker and quit 15 yrs ago.  His mother had a hx of CHF.  Nuclear stress test 08/2019 showed no ischemia.  He then presented with NSTEMI in 04/2020 in setting of hypertensive urgency and cath at that time showed diffuse small vessel disease in the dPDA 75% and small OM1 75% and OM2 99% branches and 50% pD1 which were not amenable to PCI and medical management was recommended.   ? ?He then presented to the ER on 10/12/2020 after awakening with acute left sided CP 8/10 on 7/31 similar to prior MI with radiation to his shoulders and took SL NTG which resolved his pain.  He continued to have intermittent CP relieved with SL NTG but the CP became more frequent and went to the ER.  BP was elevated at 1205/163mmHg but had not taken his BP meds that am.  hsTrop was 8 and 9.  WKG showed no ST changes.  LDL was at goal at 15 and nuclear stress test showed no signficant ischemia and he was continued on medical therapy for angina with an increase in carvedilol to 6.25mg  BID and Imdur 15mg  daily was added.   ? ?eh is here today for followup and is doing well.  He denies any chest pain or pressure, SOB, DOE, PND, orthopnea, LE edema, dizziness, palpitations or syncope. He is compliant with his meds and is tolerating meds with no SE.    ?Past Medical History:  ?Diagnosis Date  ? Acute pyloric channel ulcer 07/13/2014  ? Acute upper GI bleed 07/13/2014  ? CAD (coronary artery disease)   ? Cerebrovascular  disease 10/08/2018  ? Chronic kidney disease, stage 3a ([place]) 03/30/2017  ? Dilated aortic root ([place])   ? 38 mm by echo with 04/2021  ? Gastrointestinal hemorrhage with melena 06/2014  ? Global aphasia 03/27/2017  ? Helicobacter pylori gastritis 07/13/2014  ? High cholesterol   ? Hypernatremia 07/13/2014  ? Hypertension   ? Hypokalemia 07/13/2014  ? Normocytic normochromic anemia 04/10/2017  ? REM sleep behavior disorder 10/08/2018  ? Subdural hematoma 03/26/2017  ? Thrombocytopenia ([place]) 07/13/2014  ? ? ?Past Surgical History:  ?Procedure Laterality Date  ? BRAIN SURGERY    ? CRANIOTOMY Left 03/26/2017  ? Procedure: CRANIOTOMY HEMATOMA EVACUATION SUBDURAL;  Surgeon: Kary Kos, MD;  Location: [place];  Service: Neurosurgery;  Laterality: Left;  ? ESOPHAGOGASTRODUODENOSCOPY N/A 07/11/2014  ? Procedure: ESOPHAGOGASTRODUODENOSCOPY (EGD);  Surgeon: Wilford Corner, MD;  Location: [facility];  Service: Endoscopy;  Laterality: N/A;  ? LEFT HEART CATH AND CORONARY ANGIOGRAPHY N/A 04/26/2020  ? Procedure: LEFT HEART CATH AND CORONARY ANGIOGRAPHY;  Surgeon: Jettie Booze, MD;  Location: [place] [facility];  Service: Cardiovascular;  Laterality: N/A;  ? ? ?Current Medications: ?Current Meds  ?Medication Sig  ? amLODipine (NORVASC) 10 MG tablet Take 1 tablet (10 mg total) by mouth daily.  ?  aspirin 81 MG EC tablet Take 1 tablet (81 mg total) by mouth daily. Swallow whole.  ? carvedilol (COREG) 6.25 MG tablet Take 1 tablet (6.25 mg total) by mouth 2 (two) times daily with a meal.  ? ezetimibe (ZETIA) 10 MG tablet Take 1 tablet (10 mg total) by mouth daily.  ? famotidine (PEPCID) 20 MG tablet Take 20 mg by mouth daily.  ? isosorbide mononitrate (IMDUR) 30 MG 24 hr tablet Take 1 tablet (30 mg total) by mouth daily.  ? Multiple Vitamins-Minerals (MULTIVITAMIN ADULTS 50+ PO) Take 1 tablet by mouth daily.  ? nitroGLYCERIN (NITROSTAT) 0.4 MG SL tablet Place 1 tablet (0.4 mg total) under the tongue every 5 (five) minutes as needed  for chest pain.  ? paricalcitol (ZEMPLAR) 1 MCG capsule Take 1 mcg by mouth daily.  ? rosuvastatin (CRESTOR) 40 MG tablet Take 1 tablet (40 mg total) by mouth daily.  ? ? ?Allergies:   Doxazosin mesylate  ? ?Social History  ? ?Socioeconomic History  ? Marital status: Widowed  ?  Spouse name: Not on file  ? Number of children: 3  ? Years of education: 36  ? Highest education level: Not on file  ?Occupational History  ? Not on file  ?Tobacco Use  ? Smoking status: Former  ?  Packs/day: 1.00  ?  Years: 35.00  ?  Pack years: 35.00  ?  Types: Cigarettes  ? Smokeless tobacco: Never  ?Substance and Sexual Activity  ? Alcohol use: No  ? Drug use: Not Currently  ? Sexual activity: Never  ?Other Topics Concern  ? Not on file  ?Social History Narrative  ? Right handed   ? Occasional cup of coffee   ? Lives at home alone  ? Wife is deceased along with 2 children 1 child is living    ? ?Social Determinants of Health  ? ?Financial Resource Strain: Not on file  ?Food Insecurity: Not on file  ?Transportation Needs: Not on file  ?Physical Activity: Not on file  ?Stress: Not on file  ?Social Connections: Not on file  ?  ? ?Family History:  The patient's family history includes Heart failure in his mother.  ? ?ROS:   ?Please see the history of present illness.    ?ROS All other systems reviewed and are negative. ? ?   ? View : No data to display.  ?  ?  ?  ? ? ?PHYSICAL EXAM:   ?VS:  BP 106/62   Pulse 84   Ht 6\' 2"  (1.88 m)   Wt 158 lb (71.7 kg)   SpO2 97%   BMI 20.29 kg/m?    ?GEN: Well nourished, well developed in no acute distress ?HEENT: Normal ?NECK: No JVD; No carotid bruits ?LYMPHATICS: No lymphadenopathy ?CARDIAC:RRR, no murmurs, rubs, gallops ?RESPIRATORY:  Clear to auscultation without rales, wheezing or rhonchi  ?ABDOMEN: Soft, non-tender, non-distended ?MUSCULOSKELETAL:  No edema; No deformity  ?SKIN: Warm and dry ?NEUROLOGIC:  Alert and oriented x 3 ?PSYCHIATRIC:  Normal affect   ?Wt Readings from Last 3 Encounters:   ?06/03/21 158 lb (71.7 kg)  ?12/09/20 164 lb 6.4 oz (74.6 kg)  ?10/25/20 162 lb (73.5 kg)  ?  ? ? ?Studies/Labs Reviewed:  ? ?EKG:  EKG is not ordered today.   ?Recent Labs: ?07/26/2020: ALT 9 ?10/12/2020: BUN 18; Creatinine, Ser 1.42; Hemoglobin 11.1; Platelets 174; Potassium 4.3; Sodium 137; TSH 2.668  ? ?Lipid Panel ?   ?Component Value Date/Time  ? CHOL 114 10/12/2020  [id number]  ? CHOL 120 07/26/2020 0902  ? TRIG 77 10/12/2020 0457  ? HDL 32 (L) 10/12/2020 0457  ? HDL 34 (L) 07/26/2020 0902  ? CHOLHDL 3.6 10/12/2020 0457  ? VLDL 15 10/12/2020 0457  ? [place] 67 10/12/2020 0457  ? [place] 70 07/26/2020 0902  ? ? ?Additional studies/ records that were reviewed today include:  ?Hospital notes, nuclear stress test and cardiac cath from 04/2020 ? ? ? ?ASSESSMENT:   ? ?1. Coronary artery disease involving native coronary artery of native heart without angina pectoris   ?2. DOE (dyspnea on exertion)   ?3. HTN (hypertension), malignant   ?4. Pure hypercholesterolemia   ? ? ? ?PLAN:  ?In order of problems listed above: ? ?ASCAD/Chest pain ?-s/p NSTEMI in setting of hypertensive urgency in Feb 2022>>cath with diffuse small vessel disease in the distal PDA and small OM branches -none of which were amenable to PCI. ?-nuclear stress test with no ischemia 10/2020 ?-He has not had any further anginal symptoms ?-Continue prescription drug management with aspirin 81 mg daily, carvedilol 6.25 mg twice daily, high-dose statin, Imdur 30 mg daily and amlodipine 10 mg daily with as needed refills ? ?2.  SOB ?-likely related to poorly controlled HTN ?-2D echo with normal LVF, mild LVH and G1DD ?-SOB has resolved ? ?3.  HTN ?-BP controlled on exam ?-Continue prescription drug management with amlodipine 10 mg daily, carvedilol 6.25 mg twice daily and Imdur 30 mg daily with as needed refills ? ?4.  HLD ?-LDL goal < 70 ?-I have personally reviewed and interpreted outside labs performed by patient's PCP which showed LDL 67, HDL 32, triglycerides 77  on 10/12/2020 ?-Continue prescription drug management with Crestor 40 mg daily  and Zetia 10mg  daily with as needed refills ? ?5.  Dilated aortic root ?-Stable on 2D echo 04/27/2021 at 38 mm ? ? ? ?Medicati

## 2021-07-18 ENCOUNTER — Other Ambulatory Visit: Payer: Self-pay | Admitting: Cardiology

## 2021-07-19 ENCOUNTER — Telehealth: Payer: Self-pay | Admitting: Cardiology

## 2021-07-19 DIAGNOSIS — N183 Chronic kidney disease, stage 3 unspecified: Secondary | ICD-10-CM | POA: Diagnosis not present

## 2021-07-19 DIAGNOSIS — I10 Essential (primary) hypertension: Secondary | ICD-10-CM | POA: Diagnosis not present

## 2021-07-19 DIAGNOSIS — I2510 Atherosclerotic heart disease of native coronary artery without angina pectoris: Secondary | ICD-10-CM | POA: Diagnosis not present

## 2021-07-19 DIAGNOSIS — E785 Hyperlipidemia, unspecified: Secondary | ICD-10-CM | POA: Diagnosis not present

## 2021-07-19 DIAGNOSIS — E1122 Type 2 diabetes mellitus with diabetic chronic kidney disease: Secondary | ICD-10-CM | POA: Diagnosis not present

## 2021-07-19 MED ORDER — EZETIMIBE 10 MG PO TABS: 10.0000 mg | ORAL_TABLET | Freq: Every day | ORAL | 3 refills | Status: DC

## 2021-07-19 NOTE — Telephone Encounter (Signed)
*  STAT* If patient is at the pharmacy, call can be transferred to refill team.   1. Which medications need to be refilled? (please list name of each medication and dose if known)   ezetimibe (ZETIA) 10 MG tablet    2. Which pharmacy/location (including street and city if local pharmacy) is medication to be sent to?[id number] - , [place] - [address] AT [address]  3. Do they need a 30 day or 90 day supply? 90  

## 2021-07-19 NOTE — Telephone Encounter (Signed)
Pt's medication was sent to pt's pharmacy as requested. Confirmation received.  °

## 2021-08-09 IMAGING — CR DG CHEST 2V
2 series · 2 of 2 positions shown · non-contrast
Comparison: 04/25/2020

CLINICAL DATA: Chest pain.

EXAM:
CHEST - 2 VIEW

[chest lat]
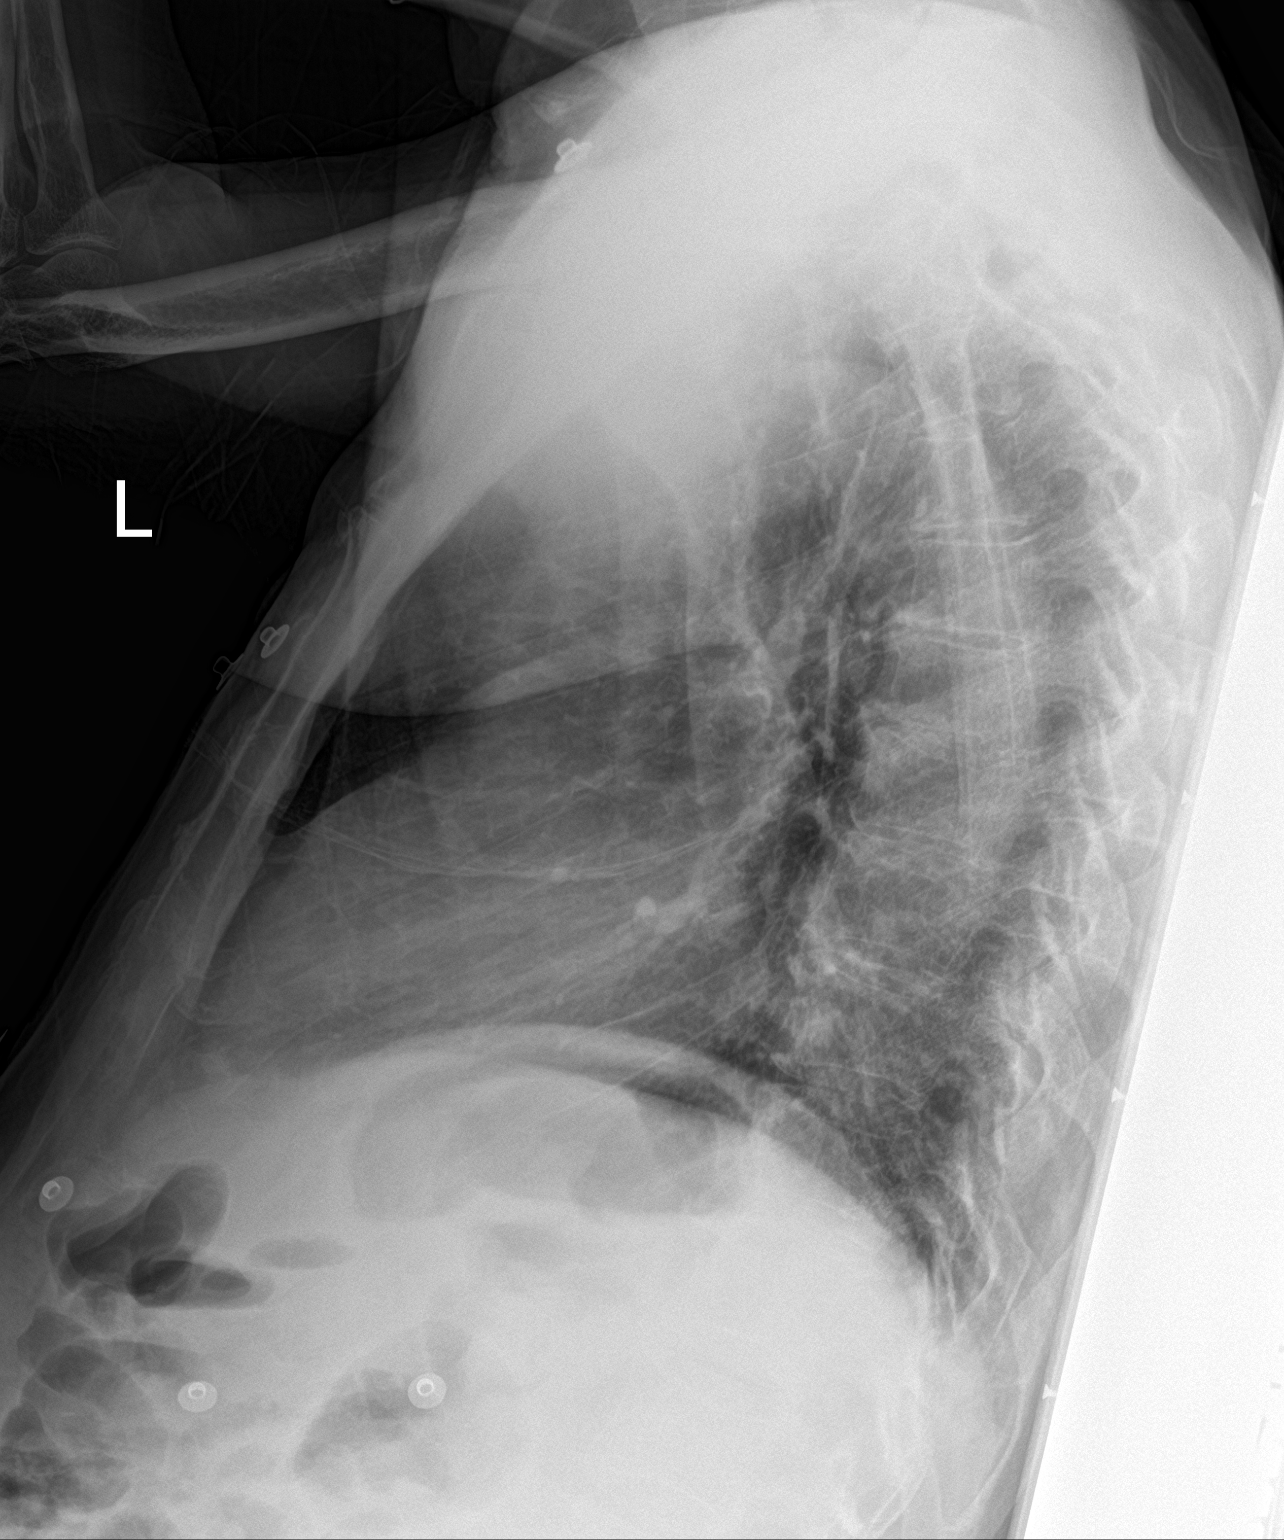

[chest ap]
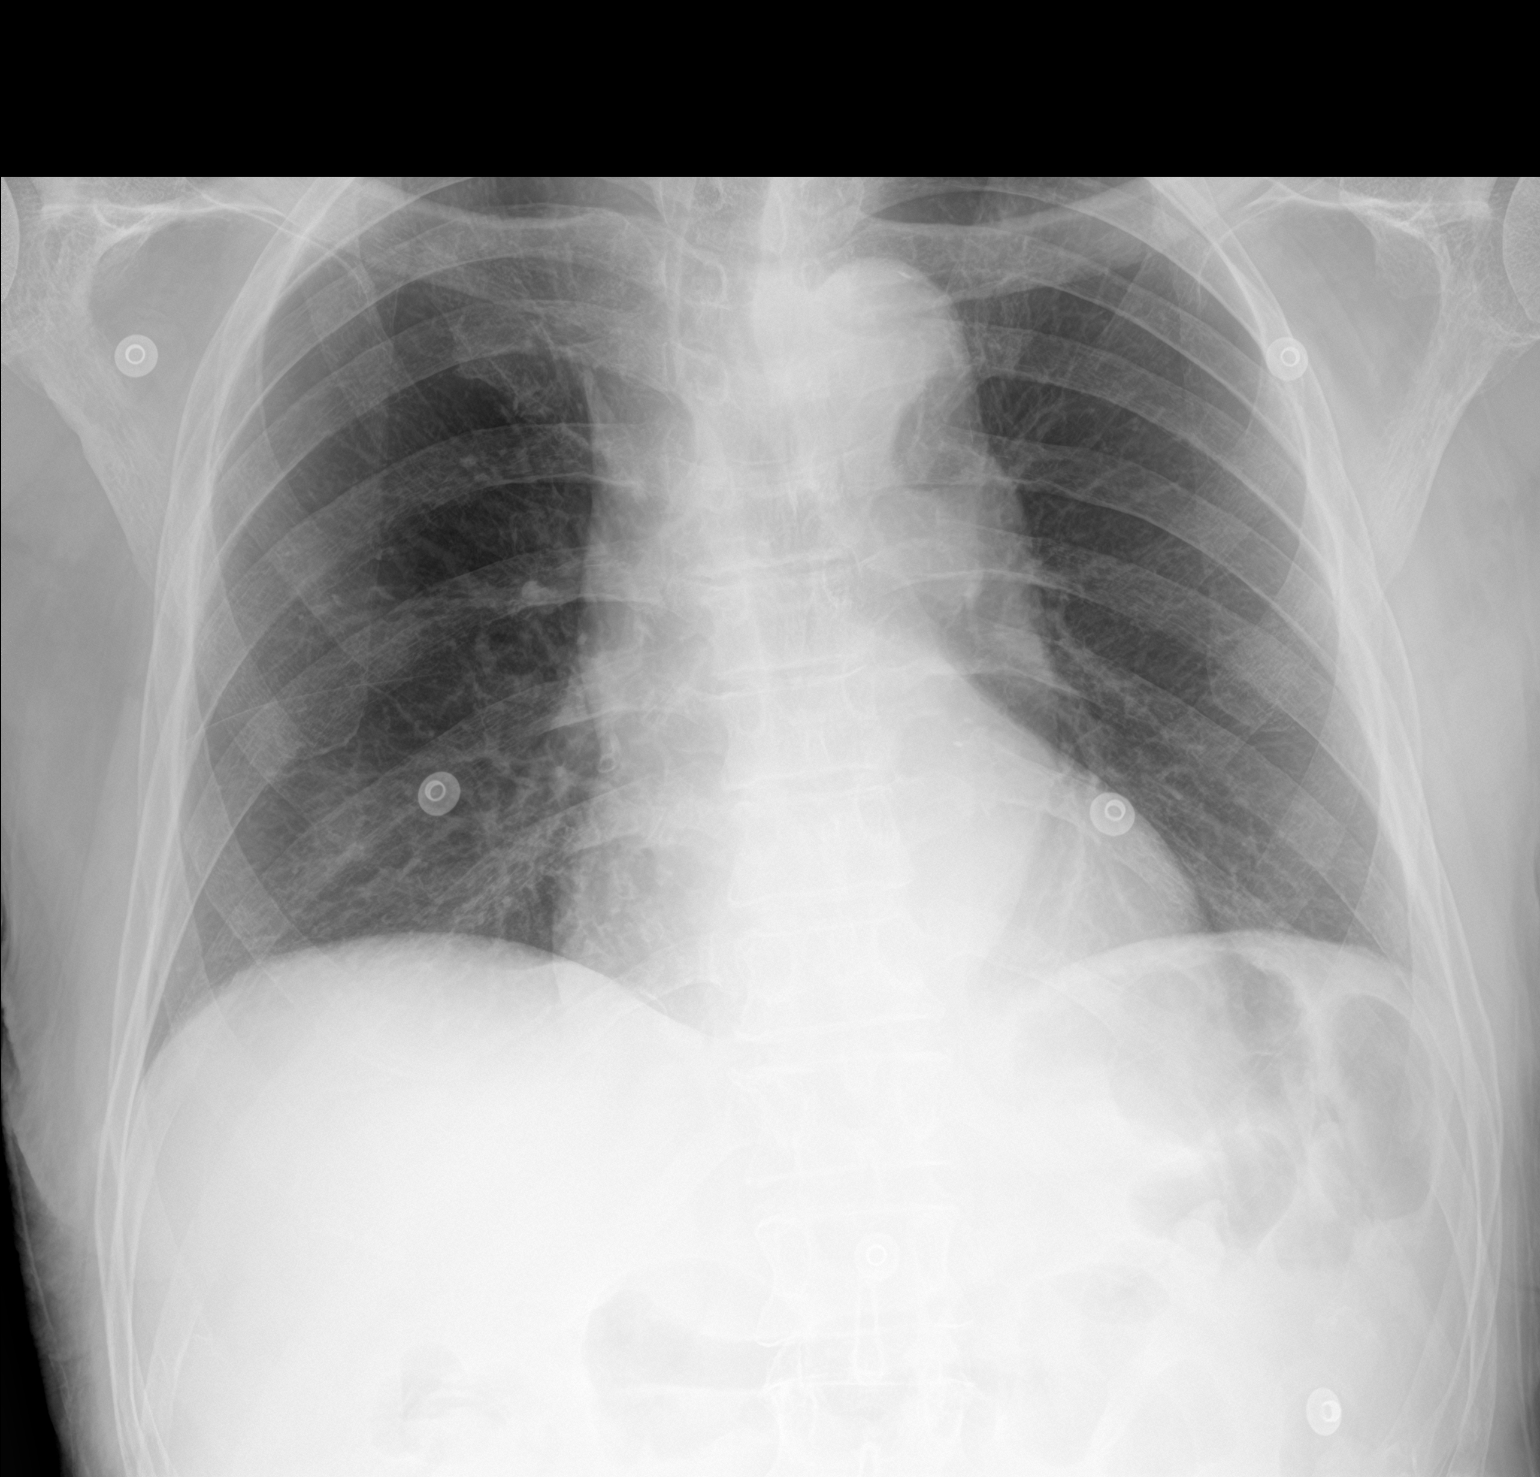

[2 of 2 positions shown; findings below may reference images not displayed]

FINDINGS: The heart is normal in size. Stable mediastinal contours. Similar
aortic tortuosity. The lungs are clear. Pulmonary vasculature is
normal. No consolidation, pleural effusion, or pneumothorax. No
acute osseous abnormalities are seen.
IMPRESSION: No acute chest findings.

## 2021-08-12 ENCOUNTER — Other Ambulatory Visit: Payer: Self-pay | Admitting: Cardiology

## 2021-08-15 ENCOUNTER — Other Ambulatory Visit: Payer: Self-pay

## 2021-08-15 MED ORDER — ASPIRIN 81 MG PO TBEC: 81.0000 mg | DELAYED_RELEASE_TABLET | Freq: Every day | ORAL | 3 refills | Status: DC

## 2021-08-23 DIAGNOSIS — H6123 Impacted cerumen, bilateral: Secondary | ICD-10-CM | POA: Diagnosis not present

## 2021-09-01 ENCOUNTER — Telehealth: Payer: Self-pay

## 2021-09-01 ENCOUNTER — Other Ambulatory Visit: Payer: Self-pay | Admitting: Cardiology

## 2021-09-01 MED ORDER — ASPIRIN 81 MG PO TBEC: 81.0000 mg | DELAYED_RELEASE_TABLET | Freq: Every day | ORAL | 3 refills | Status: DC

## 2021-09-01 NOTE — Telephone Encounter (Signed)
The patient's daughter Merita Norton left a message that the patient needed a refill on paricalcitol and aspririn.  Is it ok to fill the paricalcitol? It has never been filled by Dr. Mayford Knife before.

## 2021-09-14 ENCOUNTER — Other Ambulatory Visit: Payer: Self-pay | Admitting: Cardiology

## 2021-11-01 DIAGNOSIS — Z0000 Encounter for general adult medical examination without abnormal findings: Secondary | ICD-10-CM | POA: Diagnosis not present

## 2021-11-01 DIAGNOSIS — E785 Hyperlipidemia, unspecified: Secondary | ICD-10-CM | POA: Diagnosis not present

## 2021-11-01 DIAGNOSIS — E1122 Type 2 diabetes mellitus with diabetic chronic kidney disease: Secondary | ICD-10-CM | POA: Diagnosis not present

## 2021-11-01 DIAGNOSIS — Z23 Encounter for immunization: Secondary | ICD-10-CM | POA: Diagnosis not present

## 2021-11-03 ENCOUNTER — Other Ambulatory Visit: Payer: Self-pay | Admitting: Cardiology

## 2021-11-07 ENCOUNTER — Ambulatory Visit: Payer: Self-pay | Admitting: Licensed Clinical Social Worker

## 2021-11-07 DIAGNOSIS — N183 Chronic kidney disease, stage 3 unspecified: Secondary | ICD-10-CM | POA: Diagnosis not present

## 2021-11-07 DIAGNOSIS — Z0000 Encounter for general adult medical examination without abnormal findings: Secondary | ICD-10-CM | POA: Diagnosis not present

## 2021-11-07 DIAGNOSIS — I2510 Atherosclerotic heart disease of native coronary artery without angina pectoris: Secondary | ICD-10-CM | POA: Diagnosis not present

## 2021-11-07 DIAGNOSIS — E1122 Type 2 diabetes mellitus with diabetic chronic kidney disease: Secondary | ICD-10-CM | POA: Diagnosis not present

## 2021-11-07 DIAGNOSIS — Z8719 Personal history of other diseases of the digestive system: Secondary | ICD-10-CM | POA: Diagnosis not present

## 2021-11-07 DIAGNOSIS — E785 Hyperlipidemia, unspecified: Secondary | ICD-10-CM | POA: Diagnosis not present

## 2021-11-07 DIAGNOSIS — I10 Essential (primary) hypertension: Secondary | ICD-10-CM | POA: Diagnosis not present

## 2021-11-07 DIAGNOSIS — D509 Iron deficiency anemia, unspecified: Secondary | ICD-10-CM | POA: Diagnosis not present

## 2021-11-07 NOTE — Patient Instructions (Signed)
     It was a pleasure speaking with you today. per your request your phone appointment is scheduled 11/08/21   Sammuel Hines, [facility] Care Coordination  [facility] [phone]

## 2021-11-07 NOTE — Patient Outreach (Signed)
  Care Coordination   11/07/2021 Name: Juan Goodwin MRN: [id number] DOB: 03-14-1935   Care Coordination Outreach Attempts:  Contact was made with the patient today to offer care coordination services as a benefit of their health plan. The patient requested a return call on a later date.   Follow Up Plan:  Additional outreach attempts will be made to offer the patient care coordination information and services.   Encounter Outcome:  Pt. Scheduled  11/08/2021  Care Coordination Interventions Activated:  No   Care Coordination Interventions:  No, not indicated    Sammuel Hines, [facility] Care Coordination  [facility] [phone]

## 2021-11-08 ENCOUNTER — Ambulatory Visit: Payer: Self-pay | Admitting: Licensed Clinical Social Worker

## 2021-11-08 NOTE — Patient Outreach (Signed)
  Care Coordination  Initial Visit Note   11/08/2021 Name: Juan Goodwin MRN: [id number] DOB: April 07, 1935  Juan Goodwin is a 86 y.o. year old male who sees Daisy Floro, MD for primary care. I spoke with  [name] by phone today.  What matters to the patients health and wellness today?    Patient reports no concerns or needs from Care Coordination team with health and wellness related to physical or mental heath. .   Recommendation: Patient may benefit from, and is in agreement to Follow up on AWV and discuss options of exercise programs with insurance provider.    Goals Addressed             This Visit's Progress    COMPLETED: Care Coordination Activities: No Follow up Required       Care Coordination Interventions: Provided education to patient re: Care Coordination Services. declined Advised patient to discuss Medicare AWV with provider's office Assessed social determinant of health barriers Discussed exercise program and Silver sneakers with insurance plan No other needs identified         SDOH assessments and interventions completed:  Yes  SDOH Interventions Today    Flowsheet Row Most Recent Value  SDOH Interventions   Food Insecurity Interventions Intervention Not Indicated  Housing Interventions Intervention Not Indicated  Physical Activity Interventions [employer]  Stress Interventions Intervention Not Indicated  Transportation Interventions Intervention Not Indicated       Care Coordination Interventions Activated:  Yes  Care Coordination Interventions:  Yes, provided   Follow up plan: No further intervention required.   Encounter Outcome:  Pt. Visit Completed   Sammuel Hines, [facility] Care Coordination  [facility] [phone]

## 2021-11-08 NOTE — Patient Instructions (Signed)
Visit Information  Thank you for taking time to visit with me today. Please don't hesitate to contact me if I can be of assistance to you.   Following are the goals we discussed today:   Goals Addressed             This Visit's Progress    COMPLETED: Care Coordination Activities: No Follow up Required       Care Coordination Interventions: Provided education to patient re: Care Coordination Services. declined Advised patient to discuss Medicare AWV with provider's office Assessed social determinant of health barriers Discussed exercise program and Silver sneakers with insurance plan No other needs identified          Please call the care guide team at [phone] if you need to cancel or reschedule your appointment.   The patient verbalized understanding of instructions, educational materials, and care plan provided today and DECLINED offer to receive copy of patient instructions, educational materials, and care plan.   No further follow up required: By Care Coordination at this time  Juan Goodwin, [facility] Care Coordination  [facility] [phone]

## 2021-12-01 ENCOUNTER — Ambulatory Visit (INDEPENDENT_AMBULATORY_CARE_PROVIDER_SITE_OTHER): Payer: Medicare HMO | Admitting: Podiatry

## 2021-12-01 DIAGNOSIS — M79675 Pain in left toe(s): Secondary | ICD-10-CM

## 2021-12-01 DIAGNOSIS — M79674 Pain in right toe(s): Secondary | ICD-10-CM | POA: Diagnosis not present

## 2021-12-01 DIAGNOSIS — B351 Tinea unguium: Secondary | ICD-10-CM | POA: Diagnosis not present

## 2021-12-01 NOTE — Progress Notes (Signed)
  Subjective:  Patient ID: Juan Goodwin, male    DOB: 03/01/1936,  MRN: [id number]  Chief Complaint  Patient presents with   Nail Problem    Nail trim    86 y.o. male returns for the above complaint.  Patient presents with thickened elongated dystrophic toenails x10 mild pain on palpation and hurts with ambulation hurts with pressure.  He has not seen anyone else prior to seeing me.  Denies any other acute issues he would like to have them debrided down  Objective:  There were no vitals filed for this visit. Podiatric Exam: Vascular: dorsalis pedis and posterior tibial pulses are palpable bilateral. Capillary return is immediate. Temperature gradient is WNL. Skin turgor WNL  Sensorium: Normal Semmes Weinstein monofilament test. Normal tactile sensation bilaterally. Nail Exam: Pt has thick disfigured discolored nails with subungual debris noted bilateral entire nail hallux through fifth toenails.  Pain on palpation to the nails. Ulcer Exam: There is no evidence of ulcer or pre-ulcerative changes or infection. Orthopedic Exam: Muscle tone and strength are WNL. No limitations in general ROM. No crepitus or effusions noted.  Skin: No Porokeratosis. No infection or ulcers    Assessment & Plan:   1. Pain due to onychomycosis of toenails of both feet     Patient was evaluated and treated and all questions answered.  Onychomycosis with pain  -Nails palliatively debrided as below. -Educated on self-care  Procedure: Nail Debridement Rationale: pain  Type of Debridement: manual, sharp debridement. Instrumentation: Nail nipper, rotary burr. Number of Nails: 10  Procedures and Treatment: Consent by patient was obtained for treatment procedures. The patient understood the discussion of treatment and procedures well. All questions were answered thoroughly reviewed. Debridement of mycotic and hypertrophic toenails, 1 through 5 bilateral and clearing of subungual debris. No ulceration, no  infection noted.  Return Visit-Office Procedure: Patient instructed to return to the office for a follow up visit 3 months for continued evaluation and treatment.  Boneta Lucks, DPM    Return in about 3 months (around 03/02/2022).

## 2021-12-29 DIAGNOSIS — I129 Hypertensive chronic kidney disease with stage 1 through stage 4 chronic kidney disease, or unspecified chronic kidney disease: Secondary | ICD-10-CM | POA: Diagnosis not present

## 2021-12-29 DIAGNOSIS — N189 Chronic kidney disease, unspecified: Secondary | ICD-10-CM | POA: Diagnosis not present

## 2021-12-29 DIAGNOSIS — D631 Anemia in chronic kidney disease: Secondary | ICD-10-CM | POA: Diagnosis not present

## 2021-12-29 DIAGNOSIS — N1831 Chronic kidney disease, stage 3a: Secondary | ICD-10-CM | POA: Diagnosis not present

## 2021-12-29 DIAGNOSIS — N2581 Secondary hyperparathyroidism of renal origin: Secondary | ICD-10-CM | POA: Diagnosis not present

## 2022-04-11 DIAGNOSIS — R531 Weakness: Secondary | ICD-10-CM | POA: Diagnosis not present

## 2022-04-11 DIAGNOSIS — E1122 Type 2 diabetes mellitus with diabetic chronic kidney disease: Secondary | ICD-10-CM | POA: Diagnosis not present

## 2022-04-11 DIAGNOSIS — Z6822 Body mass index (BMI) 22.0-22.9, adult: Secondary | ICD-10-CM | POA: Diagnosis not present

## 2022-04-11 DIAGNOSIS — N2581 Secondary hyperparathyroidism of renal origin: Secondary | ICD-10-CM | POA: Diagnosis not present

## 2022-04-11 DIAGNOSIS — I10 Essential (primary) hypertension: Secondary | ICD-10-CM | POA: Diagnosis not present

## 2022-04-11 DIAGNOSIS — N183 Chronic kidney disease, stage 3 unspecified: Secondary | ICD-10-CM | POA: Diagnosis not present

## 2022-04-13 ENCOUNTER — Ambulatory Visit: Payer: Medicare HMO | Admitting: Podiatry

## 2022-05-17 ENCOUNTER — Other Ambulatory Visit: Payer: Self-pay | Admitting: Cardiology

## 2022-06-21 ENCOUNTER — Ambulatory Visit (INDEPENDENT_AMBULATORY_CARE_PROVIDER_SITE_OTHER): Payer: Medicare HMO | Admitting: Podiatry

## 2022-06-21 ENCOUNTER — Encounter: Payer: Self-pay | Admitting: Podiatry

## 2022-06-21 DIAGNOSIS — B351 Tinea unguium: Secondary | ICD-10-CM

## 2022-06-21 DIAGNOSIS — D696 Thrombocytopenia, unspecified: Secondary | ICD-10-CM

## 2022-06-21 DIAGNOSIS — M79674 Pain in right toe(s): Secondary | ICD-10-CM

## 2022-06-21 DIAGNOSIS — M79675 Pain in left toe(s): Secondary | ICD-10-CM

## 2022-06-21 NOTE — Progress Notes (Signed)
This patient returns to my office for at risk foot care.  This patient requires this care by a professional since this patient will be at risk due to having CKD and thrombocytopenia.  This patient is unable to cut nails himself since the patient cannot reach his nails.These nails are painful walking and wearing shoes. He presents to the office with his daughter.This patient presents for at risk foot care today.  General Appearance  Alert, conversant and in no acute stress.  Vascular  Dorsalis pedis and posterior tibial  pulses are palpable  bilaterally.  Capillary return is within normal limits  bilaterally. Temperature is within normal limits  bilaterally.  Neurologic  Senn-Weinstein monofilament wire test within normal limits  bilaterally. Muscle power within normal limits bilaterally.  Nails Thick disfigured discolored nails with subungual debris  from hallux to fifth toes bilaterally. No evidence of bacterial infection or drainage bilaterally.  Orthopedic  No limitations of motion  feet .  No crepitus or effusions noted.  No bony pathology or digital deformities noted.  Skin  normotropic skin with no porokeratosis noted bilaterally.  No signs of infections or ulcers noted.     Onychomycosis  Pain in right toes  Pain in left toes  Consent was obtained for treatment procedures.   Mechanical debridement of nails 1-5  bilaterally performed with a nail nipper.  Filed with dremel without incident.    Return office visit     3 months                 Told patient to return for periodic foot care and evaluation due to potential at risk complications.   Helane Gunther DPM

## 2022-07-03 DIAGNOSIS — H6123 Impacted cerumen, bilateral: Secondary | ICD-10-CM | POA: Diagnosis not present

## 2022-07-07 ENCOUNTER — Other Ambulatory Visit: Payer: Self-pay | Admitting: Cardiology

## 2022-07-13 ENCOUNTER — Telehealth: Payer: Self-pay | Admitting: Cardiology

## 2022-07-13 NOTE — Telephone Encounter (Signed)
Called patient and daughter Hansel Starling [facility]) back regarding concerns that patient is having lightheadedness early in the morning. Hansel Starling clarifies that patient is complaining of dizziness/lightheadedness when he gets up in the middle of the night to urinate and this seems to have gotten worse over the last month. Hansel Starling is concerned that upon talking to her dad recently she feels he may have had near-syncope. Hansel Starling states she is the caregiver for her mom and dad and she is worried about her dad falling. Patient denies any SOB, chest pain, or leg swelling. He endorses taking all meds as ordered including coreg, amlodipine and imdur. Hansel Starling states that he denies any symptoms during the day but his activity level is "very low". Scheduled appt w/ DOD on Monday 07/17/22. Education provided on when to call 911 ie if patient were to pass out, hit head, not respond. Dtr verbalizes understanding.  Pt w/ appt w/ Dr. Mayford Knife in June.

## 2022-07-13 NOTE — Telephone Encounter (Signed)
STAT if patient feels like he/she is going to faint   Are you dizzy now?  No, only occurs when patient gets up early in the morning to use the bathroom  Do you feel faint or have you passed out?  Has passed out recently, feels fine right now   Do you have any other symptoms?  No   Have you checked your HR and BP (record if available)?  No readings available

## 2022-07-17 ENCOUNTER — Ambulatory Visit: Payer: Medicare HMO | Attending: Cardiology | Admitting: Cardiology

## 2022-07-17 ENCOUNTER — Encounter: Payer: Self-pay | Admitting: Cardiology

## 2022-07-17 VITALS — BP 122/76 | HR 63 | Ht 74.0 in | Wt 163.0 lb

## 2022-07-17 DIAGNOSIS — I2510 Atherosclerotic heart disease of native coronary artery without angina pectoris: Secondary | ICD-10-CM | POA: Diagnosis not present

## 2022-07-17 DIAGNOSIS — I951 Orthostatic hypotension: Secondary | ICD-10-CM

## 2022-07-17 DIAGNOSIS — R55 Syncope and collapse: Secondary | ICD-10-CM

## 2022-07-17 NOTE — Patient Instructions (Signed)
Medication Instructions:  Please discontinue your Amlodipine. Continue all other medications as listed.  *If you need a refill on your cardiac medications before your next appointment, please call your pharmacy*  Follow-Up: At [facility], you and your health needs are our priority.  As part of our continuing mission to provide you with exceptional heart care, we have created designated Provider Care Teams.  These Care Teams include your primary Cardiologist (physician) and Advanced Practice Providers (APPs -  Physician Assistants and Nurse Practitioners) who all work together to provide you with the care you need, when you need it.  We recommend signing up for the patient portal called "MyChart".  Sign up information is provided on this After Visit Summary.  MyChart is used to connect with patients for Virtual Visits (Telemedicine).  Patients are able to view lab/test results, encounter notes, upcoming appointments, etc.  Non-urgent messages can be sent to your provider as well.   To learn more about what you can do with MyChart, go to [email or web address].    Your next appointment:   2 - 3 month(s)  Provider:   Jari Favre, PA-C, Robin Searing, NP, Jacolyn Reedy, PA-C, Eligha Bridegroom, NP, or Tereso Newcomer, PA-C

## 2022-07-17 NOTE — Progress Notes (Signed)
Cardiology Office Note:    Date:  07/17/2022   ID:  Juan Goodwin, DOB 1935/12/26, MRN [id number]  PCP:  Daisy Floro, MD   [facility] Providers Cardiologist:  Donato Schultz, MD     Referring MD: Daisy Floro, MD    History of Present Illness:    Juan Goodwin is a 87 y.o. male with coronary artery disease having a non-ST elevation myocardial infarction in 2022 in the setting of hypertensive urgency where cardiac catheterization showed small vessel disease and distal PDA and OM1 branch and OM 2 branch that were not amenable to PCI and treated medically.  He then presented again to the ER in August 2022 with acute left-sided chest pain troponins were normal.  Continued with medical therapy.  Increased carvedilol and isosorbide was added at that time.  Few days ago went bathroom at night and went down, pulled up on the bed to get back in. 2 days later happened again. Been going on for 6 months. Only happens in middle of night.  Instead you may be send still but just stopping for minute left nares think calibrate get all the things moving like it should and then stand up and then just wait a second make sure he had the bed because sometimes people even and in the melanite get woozy and they have to go out so that is why he stayed close to that they want to feel This and go over to the bathroom and then when you are done with the bathroom is no be I would suggest to be close to the wall moments where if you did feel like will have some of the bleeding now now some of this to the medicines that we have got you want we have on blood pressure medicines right which are great they help you out but may be 163  Past Medical History:  Diagnosis Date   Acute pyloric channel ulcer 07/13/2014   Acute upper GI bleed 07/13/2014   CAD (coronary artery disease)    Cerebrovascular disease 10/08/2018   Chronic kidney disease, stage 3a (HCC) 03/30/2017   Dilated aortic root (HCC)    38 mm  by echo with 04/2021   Gastrointestinal hemorrhage with melena 06/2014   Global aphasia 03/27/2017   Helicobacter pylori gastritis 07/13/2014   High cholesterol    Hypernatremia 07/13/2014   Hypertension    Hypokalemia 07/13/2014   Normocytic normochromic anemia 04/10/2017   REM sleep behavior disorder 10/08/2018   Subdural hematoma (HCC) 03/26/2017   Thrombocytopenia (HCC) 07/13/2014    Past Surgical History:  Procedure Laterality Date   BRAIN SURGERY     CRANIOTOMY Left 03/26/2017   Procedure: CRANIOTOMY HEMATOMA EVACUATION SUBDURAL;  Surgeon: Donalee Citrin, MD;  Location: [facility] OR;  Service: Neurosurgery;  Laterality: Left;   ESOPHAGOGASTRODUODENOSCOPY N/A 07/11/2014   Procedure: ESOPHAGOGASTRODUODENOSCOPY (EGD);  Surgeon: Charlott Rakes, MD;  Location: [facility];  Service: Endoscopy;  Laterality: N/A;   LEFT HEART CATH AND CORONARY ANGIOGRAPHY N/A 04/26/2020   Procedure: LEFT HEART CATH AND CORONARY ANGIOGRAPHY;  Surgeon: Corky Crafts, MD;  Location: [facility] INVASIVE CV LAB;  Service: Cardiovascular;  Laterality: N/A;    Current Medications: Current Meds  Medication Sig   aspirin EC 81 MG tablet Take 1 tablet (81 mg total) by mouth daily. Swallow whole.   carvedilol (COREG) 6.25 MG tablet Take 1 tablet (6.25 mg total) by mouth 2 (two) times daily with a meal.   ezetimibe (ZETIA) 10  MG tablet TAKE 1 TABLET(10 MG) BY MOUTH DAILY   famotidine (PEPCID) 20 MG tablet Take 20 mg by mouth daily.   isosorbide mononitrate (IMDUR) 30 MG 24 hr tablet TAKE 1 TABLET(30 MG) BY MOUTH DAILY   Multiple Vitamins-Minerals (MULTIVITAMIN ADULTS 50+ PO) Take 1 tablet by mouth daily.   nitroGLYCERIN (NITROSTAT) 0.4 MG SL tablet Place 1 tablet (0.4 mg total) under the tongue every 5 (five) minutes as needed for chest pain.   paricalcitol (ZEMPLAR) 1 MCG capsule Take 1 mcg by mouth daily.   rosuvastatin (CRESTOR) 40 MG tablet Take 1 tablet (40 mg total) by mouth daily.   [DISCONTINUED] amLODipine  (NORVASC) 10 MG tablet TAKE 1 TABLET(10 MG) BY MOUTH DAILY     Allergies:   Doxazosin mesylate   Social History   Socioeconomic History   Marital status: Widowed    Spouse name: Not on file   Number of children: 3   Years of education: 14   Highest education level: Not on file  Occupational History   Not on file  Tobacco Use   Smoking status: Former    Packs/day: 1.00    Years: 35.00    Additional pack years: 0.00    Total pack years: 35.00    Types: Cigarettes   Smokeless tobacco: Never  Substance and Sexual Activity   Alcohol use: No   Drug use: Not Currently   Sexual activity: Never  Other Topics Concern   Not on file  Social History Narrative   Right handed    Occasional cup of coffee    Lives at home alone   Wife is deceased along with 2 children 1 child is living     Social Determinants of Corporate investment banker Strain: Not on file  Food Insecurity: No Food Insecurity (11/08/2021)   Hunger Vital Sign    Worried About Running Out of Food in the Last Year: Never true    Ran Out of Food in the Last Year: Never true  Transportation Needs: No Transportation Needs (11/08/2021)   PRAPARE - Administrator, Civil Service (Medical): No    Lack of Transportation (Non-Medical): No  Physical Activity: Inactive (11/08/2021)   Exercise Vital Sign    Days of Exercise per Week: 0 days    Minutes of Exercise per Session: 0 min  Stress: No Stress Concern Present (11/08/2021)   [employer] of Occupational Health - Occupational Stress Questionnaire    Feeling of Stress : Only a little  Social Connections: Not on file     Family History: The patient's family history includes Heart failure in his mother.  ROS:   Please see the history of present illness.     All other systems reviewed and are negative.  EKGs/Labs/Other Studies Reviewed:    The following studies were reviewed today: Cardiac Studies & Procedures   CARDIAC CATHETERIZATION  CARDIAC  CATHETERIZATION 04/26/2020  Narrative  RPDA lesion is 75% stenosed.  1st Mrg lesion is 75% stenosed.  2nd Mrg lesion is 99% stenosed.  These three lesions are in small branch vessels.  1st Diag lesion is 50% stenosed.  LV end diastolic pressure is normal.  There is no aortic valve stenosis.  Diffuse small vessel disease in the distal PDA and small OM branches.  Main vessels are patent. I suspect that he had demand ischemia from HTN in the setting of small vessel disease.   Given that he has been pain free for some time, we  opted for medical therapy.  Continue medical therapy and BP control.  Findings Coronary Findings Diagnostic  Dominance: Right  Left Anterior Descending The vessel exhibits minimal luminal irregularities.  First Diagonal Branch 1st Diag lesion is 50% stenosed.  Left Circumflex The vessel exhibits minimal luminal irregularities.  First Obtuse Marginal Branch 1st Mrg lesion is 75% stenosed.  Second Obtuse Marginal Branch Vessel is small in size. 2nd Mrg lesion is 99% stenosed.  Third Obtuse Marginal Branch  Right Coronary Artery Vessel is large. The vessel exhibits minimal luminal irregularities.  Right Posterior Descending Artery RPDA lesion is 75% stenosed.  Right Posterior Atrioventricular Artery There is mild disease in the vessel.  Intervention  No interventions have been documented.   STRESS TESTS  MYOCARDIAL PERFUSION IMAGING 08/21/2019  Narrative  Nuclear stress EF: 50%.  There was no ST segment deviation noted during stress.  This is a low risk study.  The study is normal.  Low risk stress nuclear study with normal perfusion and normal left ventricular regional and global systolic function.   ECHOCARDIOGRAM  ECHOCARDIOGRAM COMPLETE 04/27/2021  Narrative ECHOCARDIOGRAM REPORT    Patient Name:   AUSTINN VANWIEREN Date of Exam: 04/27/2021 Medical Rec #:  [id number]      Height:       73.5 in Accession #:    [id number]      Weight:       164.4 lb Date of Birth:  09-Mar-1936      BSA:          1.990 m Patient Age:    85 years       BP:           93/65 mmHg Patient Gender: M              HR:           63 bpm. Exam Location:  [address]  Procedure: 2D Echo, 3D Echo, Cardiac Doppler and Color Doppler  Indications:    I25.10 CAD  History:        Patient has prior history of Echocardiogram examinations, most recent 04/26/2020. CKD Dilated aortic root, Signs/Symptoms:Dyspnea; Risk Factors:HLD.  Sonographer:    Clearence Ped RCS Referring Phys: [phone] TRACI R TURNER  IMPRESSIONS   1. Difficult acoustic windows limit study. Left ventricular ejection fraction, by estimation, is 60 to 65%. The left ventricle has normal function. The left ventricle has no regional wall motion abnormalities. There is severe left ventricular hypertrophy. Left ventricular diastolic parameters are indeterminate. 2. Right ventricular systolic function is normal. The right ventricular size is normal. There is normal pulmonary artery systolic pressure. 3. The mitral valve is normal in structure. Trivial mitral valve regurgitation. 4. The aortic valve is tricuspid. Aortic valve regurgitation is not visualized. 5. Aortic dilatation noted. There is borderline dilatation of the aortic root, measuring 38 mm. 6. The inferior vena cava is normal in size with greater than 50% respiratory variability, suggesting right atrial pressure of 3 mmHg.  Comparison(s): The left ventricular function is unchanged.  FINDINGS Left Ventricle: Difficult acoustic windows limit study. Left ventricular ejection fraction, by estimation, is 60 to 65%. The left ventricle has normal function. The left ventricle has no regional wall motion abnormalities. The left ventricular internal cavity size was normal in size. There is severe left ventricular hypertrophy. Left ventricular diastolic parameters are indeterminate.  Right Ventricle: The right ventricular size is  normal. Right vetricular wall thickness was not assessed. Right ventricular systolic function is normal. There is  normal pulmonary artery systolic pressure. The tricuspid regurgitant velocity is 1.63 m/s, and with an assumed right atrial pressure of 3 mmHg, the estimated right ventricular systolic pressure is 13.6 mmHg.  Left Atrium: Left atrial size was normal in size.  Right Atrium: Right atrial size was normal in size.  Pericardium: There is no evidence of pericardial effusion.  Mitral Valve: The mitral valve is normal in structure. Trivial mitral valve regurgitation.  Tricuspid Valve: The tricuspid valve is normal in structure. Tricuspid valve regurgitation is trivial.  Aortic Valve: The aortic valve is tricuspid. Aortic valve regurgitation is not visualized.  Pulmonic Valve: The pulmonic valve was normal in structure. Pulmonic valve regurgitation is trivial.  Aorta: Aortic dilatation noted. There is borderline dilatation of the aortic root, measuring 38 mm.  Venous: The inferior vena cava is normal in size with greater than 50% respiratory variability, suggesting right atrial pressure of 3 mmHg.  IAS/Shunts: No atrial level shunt detected by color flow Doppler.   LEFT VENTRICLE PLAX 2D LVIDd:         3.40 cm   Diastology LVIDs:         2.10 cm   LV e' medial:    5.00 cm/s LV PW:         1.40 cm   LV E/e' medial:  11.5 LV IVS:        1.90 cm   LV e' lateral:   6.42 cm/s LVOT diam:     2.20 cm   LV E/e' lateral: 9.0 LV SV:         65 LV SV Index:   32 LVOT Area:     3.80 cm  3D Volume EF: 3D EF:        54 % LV EDV:       87 ml LV ESV:       40 ml LV SV:        47 ml  RIGHT VENTRICLE RV Basal diam:  2.70 cm RV S prime:     8.81 cm/s TAPSE (M-mode): 2.2 cm RVSP:           13.6 mmHg  LEFT ATRIUM             Index        RIGHT ATRIUM           Index LA diam:        3.60 cm 1.81 cm/m   RA Pressure: 3.00 mmHg LA Vol (A2C):   40.4 ml 20.30 ml/m  RA Area:     10.20  cm LA Vol (A4C):   27.4 ml 13.77 ml/m  RA Volume:   19.40 ml  9.75 ml/m LA Biplane Vol: 34.8 ml 17.49 ml/m AORTIC VALVE LVOT Vmax:   88.60 cm/s LVOT Vmean:  60.600 cm/s LVOT VTI:    0.170 m  AORTA Ao Root diam: 3.80 cm Ao Asc diam:  3.60 cm  MITRAL VALVE               TRICUSPID VALVE MV Area (PHT):             TR Peak grad:   10.6 mmHg MV Decel Time:             TR Vmax:        163.00 cm/s MV E velocity: 57.60 cm/s  Estimated RAP:  3.00 mmHg MV A velocity: 76.00 cm/s  RVSP:           13.6 mmHg MV E/A  ratio:  0.76 SHUNTS Systemic VTI:  0.17 m Systemic Diam: 2.20 cm  Dietrich Pates MD Electronically signed by Dietrich Pates MD Signature Date/Time: 04/27/2021/8:36:07 PM    Final              EKG:  07/17/22: NSR 63 LAD, normal intervals.   Recent Labs: No results found for requested labs within last 365 days.  Recent Lipid Panel    Component Value Date/Time   CHOL 114 10/12/2020 0457   CHOL 120 07/26/2020 0902   TRIG 77 10/12/2020 0457   HDL 32 (L) 10/12/2020 0457   HDL 34 (L) 07/26/2020 0902   CHOLHDL 3.6 10/12/2020 0457   VLDL 15 10/12/2020 0457   LDLCALC 67 10/12/2020 0457   LDLCALC 70 07/26/2020 0902     Risk Assessment/Calculations:              Physical Exam:    VS:  BP 122/76 (BP Location: Left Arm, Patient Position: Sitting, Cuff Size: Normal)   Pulse 63   Ht 6\' 2"  (1.88 m)   Wt 163 lb (73.9 kg)   BMI 20.93 kg/m     Wt Readings from Last 3 Encounters:  07/17/22 163 lb (73.9 kg)  06/03/21 158 lb (71.7 kg)  12/09/20 164 lb 6.4 oz (74.6 kg)     GEN:  Well nourished, well developed in no acute distress HEENT: Normal NECK: No JVD; No carotid bruits LYMPHATICS: No lymphadenopathy CARDIAC: RRR, no murmurs, rubs, gallops RESPIRATORY:  Clear to auscultation without rales, wheezing or rhonchi  ABDOMEN: Soft, non-tender, non-distended MUSCULOSKELETAL:  No edema; No deformity  SKIN: Warm and dry NEUROLOGIC:  Alert and oriented x 3 PSYCHIATRIC:   Normal affect   ASSESSMENT:    1. Syncope and collapse   2. Coronary artery disease involving native coronary artery of native heart without angina pectoris   3. Orthostatic hypotension    PLAN:    In order of problems listed above:  Post micturition syncope/orthostatic symptoms -2 episodes last week of falling out after urinating in the middle of the night trying back to the bedroom.  We will go ahead and stop his amlodipine.  He has been taking 5 mg or half of his 10 mg since January.  His blood pressures at home can be as low as 105 at times.  His daughter helps with historical items.  She helps take care of him as well.  Lets have him come back in in 2-3 months  APP  Coronary artery disease with anginal symptoms - Had MI in the setting of hypertensive urgency in 2022.  Small vessel disease not amenable to PCI as described above.  Nuclear stress test August 2022 with no ischemia. Doing well currently.  Even though he has had hypertensive urgency's in the past, I think coming off of the 5 mg of amlodipine is beneficial for him to help prevent future syncopal episodes especially in the middle of the night.  Gave him precautions like sitting on the edge of the bed for quite some time to recalibrate etc.  Will also try to sit to urinate as well.          Medication Adjustments/Labs and Tests Ordered: Current medicines are reviewed at length with the patient today.  Concerns regarding medicines are outlined above.  Orders Placed This Encounter  Procedures   EKG 12-Lead   No orders of the defined types were placed in this encounter.   Patient Instructions  Medication Instructions:  Please discontinue  your Amlodipine. Continue all other medications as listed.  *If you need a refill on your cardiac medications before your next appointment, please call your pharmacy*  Follow-Up: At [facility], you and your health needs are our priority.  As part of our continuing  mission to provide you with exceptional heart care, we have created designated Provider Care Teams.  These Care Teams include your primary Cardiologist (physician) and Advanced Practice Providers (APPs -  Physician Assistants and Nurse Practitioners) who all work together to provide you with the care you need, when you need it.  We recommend signing up for the patient portal called "MyChart".  Sign up information is provided on this After Visit Summary.  MyChart is used to connect with patients for Virtual Visits (Telemedicine).  Patients are able to view lab/test results, encounter notes, upcoming appointments, etc.  Non-urgent messages can be sent to your provider as well.   To learn more about what you can do with MyChart, go to [email or web address].    Your next appointment:   2 - 3 month(s)  Provider:   Jari Favre, PA-C, Robin Searing, NP, Jacolyn Reedy, PA-C, Eligha Bridegroom, NP, or Tereso Newcomer, PA-C            Signed, Donato Schultz, MD  07/17/2022 4:40 PM    [facility]

## 2022-09-01 ENCOUNTER — Encounter: Payer: Self-pay | Admitting: Cardiology

## 2022-09-01 ENCOUNTER — Ambulatory Visit: Payer: Medicare HMO | Attending: Cardiology | Admitting: Cardiology

## 2022-09-01 VITALS — BP 124/70 | HR 88 | Ht 74.0 in | Wt 157.0 lb

## 2022-09-01 DIAGNOSIS — R55 Syncope and collapse: Secondary | ICD-10-CM | POA: Diagnosis not present

## 2022-09-01 DIAGNOSIS — Z79899 Other long term (current) drug therapy: Secondary | ICD-10-CM

## 2022-09-01 DIAGNOSIS — I77810 Thoracic aortic ectasia: Secondary | ICD-10-CM

## 2022-09-01 DIAGNOSIS — E7800 Pure hypercholesterolemia, unspecified: Secondary | ICD-10-CM

## 2022-09-01 DIAGNOSIS — I10 Essential (primary) hypertension: Secondary | ICD-10-CM

## 2022-09-01 DIAGNOSIS — I2510 Atherosclerotic heart disease of native coronary artery without angina pectoris: Secondary | ICD-10-CM

## 2022-09-01 DIAGNOSIS — R0609 Other forms of dyspnea: Secondary | ICD-10-CM

## 2022-09-01 NOTE — Addendum Note (Signed)
Addended by: Kerrie Buffalo on: 09/01/2022 03:07 PM   Modules accepted: Orders

## 2022-09-01 NOTE — Patient Instructions (Signed)
Medication Instructions:  Your physician recommends that you continue on your current medications as directed. Please refer to the Current Medication list given to you today.  *If you need a refill on your cardiac medications before your next appointment, please call your pharmacy*   Lab Work: Please make an appointment to complete a FASTING lipid panel and an ALT on a day when you can avoid food for 8 hrs.  If you have labs (blood work) drawn today and your tests are completely normal, you will receive your results only by: MyChart Message (if you have MyChart) OR A paper copy in the mail If you have any lab test that is abnormal or we need to change your treatment, we will call you to review the results.   Testing/Procedures: Your physician has requested that you have an echocardiogram. Echocardiography is a painless test that uses sound waves to create images of your heart. It provides your doctor with information about the size and shape of your heart and how well your heart's chambers and valves are working. This procedure takes approximately one hour. There are no restrictions for this procedure. Please do NOT wear cologne, perfume, aftershave, or lotions (deodorant is allowed). Please arrive 15 minutes prior to your appointment time.  How to Prepare for Your Cardiac PET/CT Stress Test:  1. Please do not take these medications before your test:   Medications that may interfere with the cardiac pharmacological stress agent (ex. nitrates - including erectile dysfunction medications, isosorbide mononitrate, tamulosin or beta-blockers) the day of the exam. (Erectile dysfunction medication should be held for at least 72 hrs prior to test) Theophylline containing medications for 12 hours. Dipyridamole 48 hours prior to the test. Your remaining medications may be taken with water.  2. Nothing to eat or drink, except water, 3 hours prior to arrival time.   NO caffeine/decaffeinated  products, or chocolate 12 hours prior to arrival.  3. NO perfume, cologne or lotion  4. Total time is 1 to 2 hours; you may want to bring reading material for the waiting time.  5. Please report to Radiology at the [facility] Main Entrance 30 minutes early for your test.  [address] [place], [place] [address]  Diabetic Preparation:  Hold oral medications. You may take NPH and Lantus insulin. Do not take Humalog or Humulin R (Regular Insulin) the day of your test. Check blood sugars prior to leaving the house. If able to eat breakfast prior to 3 hour fasting, you may take all medications, including your insulin, Do not worry if you miss your breakfast dose of insulin - start at your next meal.  IF YOU THINK YOU MAY BE PREGNANT, OR ARE NURSING PLEASE INFORM THE TECHNOLOGIST.  In preparation for your appointment, medication and supplies will be purchased.  Appointment availability is limited, so if you need to cancel or reschedule, please call the Radiology Department at [phone]  24 hours in advance to avoid a cancellation fee of $100.00  What to Expect After you Arrive:  Once you arrive and check in for your appointment, you will be taken to a preparation room within the Radiology Department.  A technologist or Nurse will obtain your medical history, verify that you are correctly prepped for the exam, and explain the procedure.  Afterwards,  an IV will be started in your arm and electrodes will be placed on your skin for EKG monitoring during the stress portion of the exam. Then you will be escorted  to the PET/CT scanner.  There, staff will get you positioned on the scanner and obtain a blood pressure and EKG.  During the exam, you will continue to be connected to the EKG and blood pressure machines.  A small, safe amount of a radioactive tracer will be injected in your IV to obtain a series of pictures of your heart along with an injection of a stress agent.    After  your Exam:  It is recommended that you eat a meal and drink a caffeinated beverage to counter act any effects of the stress agent.  Drink plenty of fluids for the remainder of the day and urinate frequently for the first couple of hours after the exam.  Your doctor will inform you of your test results within 7-10 business days.  For more information and frequently asked questions, please visit our website : [email or web address]  For questions about your test or how to prepare for your test, please call: Rockwell Alexandria, Cardiac Imaging Nurse Navigator  Larey Brick, Cardiac Imaging Nurse Navigator Office: [phone]    Follow-Up: At [facility], you and your health needs are our priority.  As part of our continuing mission to provide you with exceptional heart care, we have created designated Provider Care Teams.  These Care Teams include your primary Cardiologist (physician) and Advanced Practice Providers (APPs -  Physician Assistants and Nurse Practitioners) who all work together to provide you with the care you need, when you need it.  We recommend signing up for the patient portal called "MyChart".  Sign up information is provided on this After Visit Summary.  MyChart is used to connect with patients for Virtual Visits (Telemedicine).  Patients are able to view lab/test results, encounter notes, upcoming appointments, etc.  Non-urgent messages can be sent to your provider as well.   To learn more about what you can do with MyChart, go to [email or web address].    Your next appointment:   1 year(s)  Provider:   Dr. Armanda Magic, MD

## 2022-09-01 NOTE — Addendum Note (Signed)
Addended by: Luellen Pucker on: 09/01/2022 03:32 PM   Modules accepted: Orders

## 2022-09-01 NOTE — Progress Notes (Addendum)
Cardiology  Note    Date:  09/01/2022   ID:  [name], DOB Jul 14, 1935, MRN [id number]  PCP:  Daisy Floro, MD  Cardiologist:  Armanda Magic, MD   Chief Complaint  Patient presents with   Coronary Artery Disease   Hypertension   Hyperlipidemia     History of Present Illness:  Juan Goodwin is a 87 y.o. male with a hx of CVA, UGIB, CKD stage 3, HTN, orthostatic hypotension, subdural hematoma in 2019 who is referred a year ago for evaluation of chest pain and SOB. He had no hx of cardiac dz but is a former smoker and quit 15 yrs ago.  His mother had a hx of CHF.  Nuclear stress test 08/2019 showed no ischemia.  He then presented with NSTEMI in 04/2020 in setting of hypertensive urgency and cath at that time showed diffuse small vessel disease in the dPDA 75% and small OM1 75% and OM2 99% branches and 50% pD1 which were not amenable to PCI and medical management was recommended.    He then presented to the ER on 10/12/2020 after awakening with acute left sided CP 8/10 on 7/31 similar to prior MI with radiation to his shoulders and took SL NTG which resolved his pain.  He continued to have intermittent CP relieved with SL NTG but the CP became more frequent and went to the ER.  BP was elevated at 1205/143mmHg but had not taken his BP meds that am.  hsTrop was 8 and 9.  WKG showed no ST changes.  LDL was at goal at 67 and nuclear stress test showed no signficant ischemia and he was continued on medical therapy for angina with an increase in carvedilol to 6.25mg  BID and Imdur 15mg  daily was added.    Seen back in the office by Dr. Anne Fu as a work in on 07/17/2022 after a fall.  Apparently had gone to the bathroom and had a syncopal episode and tried to pull himself back up on the bed to get back in.  Then 2 days later the same thing happened again.  Apparently have been going on for about 6 months but only happens in the middle the night.  It was felt that he likely had post micturition  syncope with orthostatic symptoms because both episodes occurred after urinating in the middle of the night.  His amlodipine was stopped.  He is now referred back for follow-up.  He is here today for followup and is doing well.  He recently has developed SOB for the past 2-3 weeks. He is very sedentary and does not move around a lot so now he gets very SOB with any activity.  He also has been having chest pressure that is exertional that has been occurring for a few weeks. There is no associated nausea or diaphoresis with the CP.   He denies any PND, orthopnea, LE edema, dizziness, palpitations or syncope. He is compliant with his meds and is tolerating meds with no SE.     Past Medical History:  Diagnosis Date   Acute pyloric channel ulcer 07/13/2014   Acute upper GI bleed 07/13/2014   CAD (coronary artery disease)    Cerebrovascular disease 10/08/2018   Chronic kidney disease, stage 3a (HCC) 03/30/2017   Dilated aortic root (HCC)    38 mm by echo with 04/2021   Gastrointestinal hemorrhage with melena 06/2014   Global aphasia 03/27/2017   Helicobacter pylori gastritis 07/13/2014   High cholesterol  Hypernatremia 07/13/2014   Hypertension    Hypokalemia 07/13/2014   Normocytic normochromic anemia 04/10/2017   REM sleep behavior disorder 10/08/2018   Subdural hematoma (HCC) 03/26/2017   Thrombocytopenia (HCC) 07/13/2014    Past Surgical History:  Procedure Laterality Date   BRAIN SURGERY     CRANIOTOMY Left 03/26/2017   Procedure: CRANIOTOMY HEMATOMA EVACUATION SUBDURAL;  Surgeon: Donalee Citrin, MD;  Location: [facility] OR;  Service: Neurosurgery;  Laterality: Left;   ESOPHAGOGASTRODUODENOSCOPY N/A 07/11/2014   Procedure: ESOPHAGOGASTRODUODENOSCOPY (EGD);  Surgeon: Charlott Rakes, MD;  Location: [facility];  Service: Endoscopy;  Laterality: N/A;   LEFT HEART CATH AND CORONARY ANGIOGRAPHY N/A 04/26/2020   Procedure: LEFT HEART CATH AND CORONARY ANGIOGRAPHY;  Surgeon: Corky Crafts, MD;   Location: [facility];  Service: Cardiovascular;  Laterality: N/A;    Current Medications: Current Meds  Medication Sig   aspirin EC 81 MG tablet Take 1 tablet (81 mg total) by mouth daily. Swallow whole.   ezetimibe (ZETIA) 10 MG tablet TAKE 1 TABLET(10 MG) BY MOUTH DAILY   famotidine (PEPCID) 20 MG tablet Take 20 mg by mouth daily.   isosorbide mononitrate (IMDUR) 30 MG 24 hr tablet TAKE 1 TABLET(30 MG) BY MOUTH DAILY   Multiple Vitamins-Minerals (MULTIVITAMIN ADULTS 50+ PO) Take 1 tablet by mouth daily.   paricalcitol (ZEMPLAR) 1 MCG capsule Take 1 mcg by mouth daily.   rosuvastatin (CRESTOR) 40 MG tablet Take 1 tablet (40 mg total) by mouth daily.    Allergies:   Doxazosin mesylate   Social History   Socioeconomic History   Marital status: Widowed    Spouse name: Not on file   Number of children: 3   Years of education: 14   Highest education level: Not on file  Occupational History   Not on file  Tobacco Use   Smoking status: Former    Packs/day: 1.00    Years: 35.00    Additional pack years: 0.00    Total pack years: 35.00    Types: Cigarettes   Smokeless tobacco: Never  Substance and Sexual Activity   Alcohol use: No   Drug use: Not Currently   Sexual activity: Never  Other Topics Concern   Not on file  Social History Narrative   Right handed    Occasional cup of coffee    Lives at home alone   Wife is deceased along with 2 children 1 child is living     Social Determinants of Corporate investment banker Strain: Not on file  Food Insecurity: No Food Insecurity (11/08/2021)   Hunger Vital Sign    Worried About Running Out of Food in the Last Year: Never true    Ran Out of Food in the Last Year: Never true  Transportation Needs: No Transportation Needs (11/08/2021)   PRAPARE - Administrator, Civil Service (Medical): No    Lack of Transportation (Non-Medical): No  Physical Activity: Inactive (11/08/2021)   Exercise Vital Sign    Days of  Exercise per Week: 0 days    Minutes of Exercise per Session: 0 min  Stress: No Stress Concern Present (11/08/2021)   [employer] of Occupational Health - Occupational Stress Questionnaire    Feeling of Stress : Only a little  Social Connections: Not on file     Family History:  The patient's family history includes Heart failure in his mother.   ROS:   Please see the history of present illness.  ROS All other systems reviewed and are negative.      No data to display          PHYSICAL EXAM:   VS:  BP 124/70   Pulse 88   Ht 6\' 2"  (1.88 m)   Wt 157 lb (71.2 kg)   SpO2 98%   BMI 20.16 kg/m    GEN: Well nourished, well developed in no acute distress HEENT: Normal NECK: No JVD; No carotid bruits LYMPHATICS: No lymphadenopathy CARDIAC:RRR, no murmurs, rubs, gallops RESPIRATORY:  Clear to auscultation without rales, wheezing or rhonchi  ABDOMEN: Soft, non-tender, non-distended MUSCULOSKELETAL:  No edema; No deformity  SKIN: Warm and dry NEUROLOGIC:  Alert and oriented x 3 PSYCHIATRIC:  Normal affect   Wt Readings from Last 3 Encounters:  09/01/22 157 lb (71.2 kg)  07/17/22 163 lb (73.9 kg)  06/03/21 158 lb (71.7 kg)      Studies/Labs Reviewed:   EKG Interpretation  Date/Time:  Friday September 01 2022 15:16:00 EDT Ventricular Rate:  86 PR Interval:  164 QRS Duration: 96 QT Interval:  380 QTC Calculation: 454 R Axis:   -72 Text Interpretation: Normal sinus rhythm Left anterior fascicular block Possible Lateral infarct , age undetermined When compared with ECG of 11-Oct-2020 15:26, No significant change was found Confirmed by Armanda Magic ([id number]) on 09/01/2022 3:23:12 PM    Recent Labs: No results found for requested labs within last 365 days.   Lipid Panel    Component Value Date/Time   CHOL 114 10/12/2020 0457   CHOL 120 07/26/2020 0902   TRIG 77 10/12/2020 0457   HDL 32 (L) 10/12/2020 0457   HDL 34 (L) 07/26/2020 0902   CHOLHDL 3.6 10/12/2020 0457    VLDL 15 10/12/2020 0457   LDLCALC 67 10/12/2020 0457   LDLCALC 70 07/26/2020 0902    Additional studies/ records that were reviewed today include:  Hospital notes, nuclear stress test and cardiac cath from 04/2020    ASSESSMENT:    1. Coronary artery disease involving native coronary artery of native heart without angina pectoris   2. DOE (dyspnea on exertion)   3. HTN (hypertension), malignant   4. Pure hypercholesterolemia   5. Dilated aortic root (HCC)   6. Syncope and collapse      PLAN:  In order of problems listed above:  ASCAD/Chest pain -s/p NSTEMI in setting of hypertensive urgency in Feb 2022>>cath with diffuse small vessel disease in the distal PDA and small OM branches -none of which were amenable to PCI. -nuclear stress test with no ischemia 10/2020 -He recently for the past few weeks has been having episodes of exertional CP and SOB.   -He is very vague in his sx and he also is very sedentary so hard to get a feel if this is really angina -I will order a 2D echo to make sure LVF is normal and Stress PET CT to rule out ischemia -Informed Consent   Shared Decision Making/Informed Consen The risks [chest pain, shortness of breath, cardiac arrhythmias, dizziness, blood pressure fluctuations, myocardial infarction, stroke/transient ischemic attack, nausea, vomiting, allergic reaction, radiation exposure, metallic taste sensation and life-threatening complications (estimated to be 1 in 10,000)], benefits (risk stratification, diagnosing coronary artery disease, treatment guidance) and alternatives of a cardiac PET stress test were discussed in detail with Mr. Uzelac and he agrees to proceed.   -Continue prescription drug management with carvedilol 6.25 mg twice daily, aspirin 81 mg daily, Imdur 30 mg daily and Crestor 40 mg daily  with as needed refills  2.  SOB -thought to be related to poorly controlled HTN but BP is controlled and having worse SOB -suspect SOB related  to sedentary state and deconditioning -repeat 2D echo and get Stress test  3.  HTN -BP is adequately controlled on exam today after coming off amlodipine due to several episodes of syncope -Continue prescription drug management with carvedilol 6.25 mg twice daily, Imdur 30 mg daily with as needed refills  4.  HLD -LDL goal < 70 -I have personally reviewed and interpreted outside labs performed by patient's PCP which showed LDL 93 and HDL 35 on 11/01/2021 -I am going to repeat FLP and ALT -For now continue with drug management with Crestor 40 mg daily and Zetia 10 mg daily with as needed refills  5.  Dilated aortic root -Stable on 2D echo 04/27/2021 at 38 mm  6.  Syncope -He has had several episodes of syncope over the past 6 months all occurring in the middle the night after he is urinated and consistent with micturition syncope with orthostatic symptoms -Amlodipine was stopped a month ago and he has not had any further syncopal episodes    Medication Adjustments/Labs and Tests Ordered: Current medicines are reviewed at length with the patient today.  Concerns regarding medicines are outlined above.  Medication changes, Labs and Tests ordered today are listed in the Patient Instructions below.  There are no Patient Instructions on file for this visit.   Signed, Armanda Magic, MD  09/01/2022 3:00 PM    [facility] [address] [place], [place], [place]  [address] Phone: [phone]; Fax: [phone]

## 2022-09-01 NOTE — Addendum Note (Signed)
Addended by: Luellen Pucker on: 09/01/2022 03:23 PM   Modules accepted: Orders

## 2022-09-01 NOTE — Addendum Note (Signed)
Addended by: Quintella Reichert on: 09/01/2022 06:56 PM   Modules accepted: Orders

## 2022-09-04 ENCOUNTER — Ambulatory Visit: Payer: Medicare HMO

## 2022-09-04 NOTE — Addendum Note (Signed)
Addended by: Luellen Pucker on: 09/04/2022 08:08 AM   Modules accepted: Orders

## 2022-09-11 ENCOUNTER — Ambulatory Visit: Payer: Medicare HMO | Attending: Cardiology

## 2022-09-11 ENCOUNTER — Other Ambulatory Visit: Payer: Self-pay

## 2022-09-11 DIAGNOSIS — E7800 Pure hypercholesterolemia, unspecified: Secondary | ICD-10-CM | POA: Diagnosis not present

## 2022-09-11 DIAGNOSIS — Z79899 Other long term (current) drug therapy: Secondary | ICD-10-CM

## 2022-09-11 LAB — LIPID PANEL
Chol/HDL Ratio: 3.2 ratio (ref 0.0–5.0)
Cholesterol, Total: 122 mg/dL (ref 100–199)
HDL: 38 mg/dL — ABNORMAL LOW (ref >39)
LDL Chol Calc (NIH): 66 mg/dL (ref 0–99)
Triglycerides: 95 mg/dL (ref 0–149)
VLDL Cholesterol Cal: 18 mg/dL (ref 5–40)

## 2022-09-11 LAB — ALT: ALT: 12 IU/L (ref 0–44)

## 2022-09-11 MED ORDER — NITROGLYCERIN 0.4 MG SL SUBL: 0.4000 mg | SUBLINGUAL_TABLET | SUBLINGUAL | 11 refills | Status: AC | PRN

## 2022-09-11 NOTE — Telephone Encounter (Signed)
Pt's medication was sent to pt's pharmacy as requested. Confirmation received.  °

## 2022-09-20 ENCOUNTER — Encounter: Payer: Self-pay | Admitting: Podiatry

## 2022-09-20 ENCOUNTER — Ambulatory Visit: Payer: Medicare HMO | Admitting: Podiatry

## 2022-09-20 DIAGNOSIS — M79674 Pain in right toe(s): Secondary | ICD-10-CM | POA: Diagnosis not present

## 2022-09-20 DIAGNOSIS — M79675 Pain in left toe(s): Secondary | ICD-10-CM | POA: Diagnosis not present

## 2022-09-20 DIAGNOSIS — B351 Tinea unguium: Secondary | ICD-10-CM | POA: Diagnosis not present

## 2022-09-20 DIAGNOSIS — D696 Thrombocytopenia, unspecified: Secondary | ICD-10-CM | POA: Diagnosis not present

## 2022-09-20 NOTE — Progress Notes (Signed)
This patient returns to my office for at risk foot care.  This patient requires this care by a professional since this patient will be at risk due to having CKD and thrombocytopenia.  This patient is unable to cut nails himself since the patient cannot reach his nails.These nails are painful walking and wearing shoes. He presents to the office with his daughter.This patient presents for at risk foot care today.  General Appearance  Alert, conversant and in no acute stress.  Vascular  Dorsalis pedis and posterior tibial  pulses are palpable  bilaterally.  Capillary return is within normal limits  bilaterally. Temperature is within normal limits  bilaterally.  Neurologic  Senn-Weinstein monofilament wire test within normal limits  bilaterally. Muscle power within normal limits bilaterally.  Nails Thick disfigured discolored nails with subungual debris  from hallux to fifth toes bilaterally. No evidence of bacterial infection or drainage bilaterally.  Orthopedic  No limitations of motion  feet .  No crepitus or effusions noted.  No bony pathology or digital deformities noted.  Skin  normotropic skin with no porokeratosis noted bilaterally.  No signs of infections or ulcers noted.     Onychomycosis  Pain in right toes  Pain in left toes  Consent was obtained for treatment procedures.   Mechanical debridement of nails 1-5  bilaterally performed with a nail nipper.  Filed with dremel without incident.    Return office visit     3 months                 Told patient to return for periodic foot care and evaluation due to potential at risk complications.   Jasani Dolney DPM   

## 2022-10-02 ENCOUNTER — Ambulatory Visit (HOSPITAL_COMMUNITY): Payer: Medicare HMO | Attending: Internal Medicine

## 2022-10-02 ENCOUNTER — Encounter: Payer: Self-pay | Admitting: Cardiology

## 2022-10-02 DIAGNOSIS — R0609 Other forms of dyspnea: Secondary | ICD-10-CM | POA: Insufficient documentation

## 2022-10-02 DIAGNOSIS — I77810 Thoracic aortic ectasia: Secondary | ICD-10-CM | POA: Insufficient documentation

## 2022-10-02 LAB — ECHOCARDIOGRAM COMPLETE
Area-P 1/2: 2.39 cm2
S' Lateral: 2.8 cm

## 2022-10-23 ENCOUNTER — Telehealth: Payer: Self-pay | Admitting: Cardiology

## 2022-10-23 DIAGNOSIS — I77810 Thoracic aortic ectasia: Secondary | ICD-10-CM

## 2022-10-23 NOTE — Telephone Encounter (Signed)
Patient's daughter is calling requesting a callback to discuss pt's echo results.  Please advise.

## 2022-10-23 NOTE — Telephone Encounter (Signed)
Spoke with patient's daughter Hansel Starling (OK per DPR). Discussed echo results.  Per Dr. Mayford Knife: Echo showed normal heart function with thickened heart muscle and increased stiffness of heart muscle called diastolic dysfunction related to aging.  The ascending aorta is mildly dilated at 4.3cm.This has increased from 3.8cm last year.  Please get a Chest CTA gated to assess further   Chest CTA ordered, scheduler to call and schedule appt for CT scan.  Adrienne verbalized understanding and expressed appreciation for call.

## 2022-11-02 ENCOUNTER — Telehealth: Payer: Self-pay

## 2022-11-02 NOTE — Telephone Encounter (Signed)
Results given to dtr on 10/23/22, order placed.

## 2022-11-02 NOTE — Telephone Encounter (Signed)
-----   Message from Armanda Magic sent at 10/02/2022  2:38 PM EDT ----- Echo showed normal heart function with thickened heart muscle and increased stiffness of heart muscle called diastolic dysfunction related to aging.  The ascending aorta is mildly dilated at 4.3cm.This has increased from 3.8cm last year.  Please get a Chest CTA gated to assess further

## 2022-11-03 ENCOUNTER — Other Ambulatory Visit: Payer: Self-pay | Admitting: Cardiology

## 2022-12-21 ENCOUNTER — Ambulatory Visit: Payer: Medicare HMO | Admitting: Podiatry

## 2023-01-05 ENCOUNTER — Encounter: Payer: Self-pay | Admitting: Podiatry

## 2023-01-05 ENCOUNTER — Ambulatory Visit (INDEPENDENT_AMBULATORY_CARE_PROVIDER_SITE_OTHER): Payer: Medicare HMO | Admitting: Podiatry

## 2023-01-05 ENCOUNTER — Telehealth (HOSPITAL_COMMUNITY): Payer: Self-pay | Admitting: Emergency Medicine

## 2023-01-05 DIAGNOSIS — B351 Tinea unguium: Secondary | ICD-10-CM | POA: Diagnosis not present

## 2023-01-05 DIAGNOSIS — D696 Thrombocytopenia, unspecified: Secondary | ICD-10-CM

## 2023-01-05 DIAGNOSIS — M79675 Pain in left toe(s): Secondary | ICD-10-CM

## 2023-01-05 DIAGNOSIS — M79674 Pain in right toe(s): Secondary | ICD-10-CM

## 2023-01-05 NOTE — Progress Notes (Signed)
This patient returns to my office for at risk foot care.  This patient requires this care by a professional since this patient will be at risk due to having CKD and thrombocytopenia.  This patient is unable to cut nails himself since the patient cannot reach his nails.These nails are painful walking and wearing shoes. He presents to the office with his daughter.This patient presents for at risk foot care today.  General Appearance  Alert, conversant and in no acute stress.  Vascular  Dorsalis pedis and posterior tibial  pulses are palpable  bilaterally.  Capillary return is within normal limits  bilaterally. Temperature is within normal limits  bilaterally.  Neurologic  Senn-Weinstein monofilament wire test within normal limits  bilaterally. Muscle power within normal limits bilaterally.  Nails Thick disfigured discolored nails with subungual debris  from hallux to fifth toes bilaterally. No evidence of bacterial infection or drainage bilaterally.  Orthopedic  No limitations of motion  feet .  No crepitus or effusions noted.  No bony pathology or digital deformities noted.  Skin  normotropic skin with no porokeratosis noted bilaterally.  No signs of infections or ulcers noted.     Onychomycosis  Pain in right toes  Pain in left toes  Consent was obtained for treatment procedures.   Mechanical debridement of nails 1-5  bilaterally performed with a nail nipper.  Filed with dremel without incident.    Return office visit     3 months                 Told patient to return for periodic foot care and evaluation due to potential at risk complications.   Jasani Dolney DPM   

## 2023-01-05 NOTE — Telephone Encounter (Signed)
Attempted to call patient regarding upcoming cardiac PET appointment. Left message on voicemail with name and callback number Aidan Moten RN Navigator Cardiac Imaging [facility] [phone] Office [phone] Cell  

## 2023-01-08 NOTE — Telephone Encounter (Signed)
Attempted to call patient regarding upcoming cardiac PET appointment. Left message on voicemail with name and callback number Johney Frame RN Navigator Cardiac Imaging [facility] [phone] Office

## 2023-01-09 ENCOUNTER — Encounter: Payer: Self-pay | Admitting: Cardiology

## 2023-01-09 ENCOUNTER — Other Ambulatory Visit: Payer: Self-pay

## 2023-01-09 ENCOUNTER — Encounter (HOSPITAL_COMMUNITY)
Admission: RE | Admit: 2023-01-09 | Discharge: 2023-01-09 | Disposition: A | Discharge status: Home / Self Care | Payer: Medicare HMO | Source: Ambulatory Visit | Attending: Cardiology | Admitting: Cardiology

## 2023-01-09 ENCOUNTER — Telehealth: Payer: Self-pay

## 2023-01-09 DIAGNOSIS — R0609 Other forms of dyspnea: Secondary | ICD-10-CM | POA: Insufficient documentation

## 2023-01-09 DIAGNOSIS — I2510 Atherosclerotic heart disease of native coronary artery without angina pectoris: Secondary | ICD-10-CM | POA: Diagnosis not present

## 2023-01-09 DIAGNOSIS — I700 Atherosclerosis of aorta: Secondary | ICD-10-CM | POA: Diagnosis not present

## 2023-01-09 LAB — NM PET CT CARDIAC PERFUSION MULTI W/ABSOLUTE BLOODFLOW
LV dias vol: 95 mL (ref 62–150)
LV sys vol: 42 mL
MBFR: 2.37
Nuc Rest EF: 56 %
Nuc Stress EF: 59 %
Peak HR: 77 {beats}/min
Rest HR: 64 {beats}/min
Rest MBF: 0.97 ml/g/min
Rest Nuclear Isotope Dose: 18.5 mCi
ST Depression (mm): 0 mm
Stress MBF: 2.3 ml/g/min
Stress Nuclear Isotope Dose: 18.5 mCi

## 2023-01-09 MED ORDER — REGADENOSON 0.4 MG/5ML IV SOLN
0.4000 mg | Freq: Once | INTRAVENOUS | Status: AC
  Administered 2023-01-09: 0.4 mg via INTRAVENOUS

## 2023-01-09 MED ORDER — REGADENOSON 0.4 MG/5ML IV SOLN
INTRAVENOUS | Status: AC
  Filled 2023-01-09: qty 5

## 2023-01-09 MED ORDER — RUBIDIUM RB82 GENERATOR (RUBYFILL)
18.5000 | PACK | Freq: Once | INTRAVENOUS | Status: AC
  Administered 2023-01-09: 18.5 via INTRAVENOUS

## 2023-01-09 MED ORDER — RUBIDIUM RB82 GENERATOR (RUBYFILL)
18.4300 | PACK | Freq: Once | INTRAVENOUS | Status: AC
  Administered 2023-01-09: 18.48 via INTRAVENOUS

## 2023-01-09 NOTE — Telephone Encounter (Signed)
Call to discuss stress test results, no answer. Left detailed message per DPR adavising stress test was normal but there is presence of aortic atherosclerosis and CAD. Also asking patient to call back and discuss any chest pain he is having.

## 2023-01-09 NOTE — Telephone Encounter (Signed)
-----   Message from Armanda Magic sent at 01/09/2023  4:06 PM EDT ----- Please find out if patient has had any further CP

## 2023-01-29 ENCOUNTER — Telehealth: Payer: Self-pay

## 2023-01-29 NOTE — Telephone Encounter (Signed)
-----   Message from Armanda Magic sent at 01/09/2023  4:06 PM EDT ----- Please find out if patient has had any further CP

## 2023-01-29 NOTE — Telephone Encounter (Signed)
Called to discuss stress test results, no answer. Left detailed message per DPR asking patient to call our office to discuss results and symptoms.

## 2023-02-01 ENCOUNTER — Telehealth: Payer: Self-pay | Admitting: Cardiology

## 2023-02-01 NOTE — Telephone Encounter (Signed)
Pt's daughter calling back for the results for the pt. Please advise

## 2023-02-01 NOTE — Telephone Encounter (Signed)
Pts daughter on DPR advised the pts stress test and notes he has not been c/o any further chest pain.Marland Kitchen she will monitor and let us know if any changes.

## 2023-02-21 DIAGNOSIS — I77810 Thoracic aortic ectasia: Secondary | ICD-10-CM | POA: Diagnosis not present

## 2023-02-21 DIAGNOSIS — H6123 Impacted cerumen, bilateral: Secondary | ICD-10-CM | POA: Diagnosis not present

## 2023-03-04 ENCOUNTER — Other Ambulatory Visit: Payer: Self-pay | Admitting: Cardiology

## 2023-03-19 ENCOUNTER — Encounter: Payer: Self-pay | Admitting: Cardiology

## 2023-04-06 ENCOUNTER — Ambulatory Visit (HOSPITAL_COMMUNITY)
Admission: RE | Admit: 2023-04-06 | Discharge: 2023-04-06 | Disposition: A | Discharge status: Home / Self Care | Payer: Medicare HMO | Source: Ambulatory Visit | Attending: Cardiology | Admitting: Cardiology

## 2023-04-06 ENCOUNTER — Encounter: Payer: Self-pay | Admitting: Cardiology

## 2023-04-06 DIAGNOSIS — I2510 Atherosclerotic heart disease of native coronary artery without angina pectoris: Secondary | ICD-10-CM | POA: Diagnosis not present

## 2023-04-06 DIAGNOSIS — I700 Atherosclerosis of aorta: Secondary | ICD-10-CM | POA: Diagnosis not present

## 2023-04-06 DIAGNOSIS — I77810 Thoracic aortic ectasia: Secondary | ICD-10-CM | POA: Insufficient documentation

## 2023-04-06 DIAGNOSIS — J439 Emphysema, unspecified: Secondary | ICD-10-CM | POA: Diagnosis not present

## 2023-04-06 LAB — POCT I-STAT CREATININE: Creatinine, Ser: 1.5 mg/dL — ABNORMAL HIGH (ref 0.61–1.24)

## 2023-04-06 MED ORDER — IOHEXOL 350 MG/ML SOLN
75.0000 mL | Freq: Once | INTRAVENOUS | Status: AC | PRN
  Administered 2023-04-06: 75 mL via INTRAVENOUS

## 2023-04-09 ENCOUNTER — Ambulatory Visit: Payer: Medicare HMO | Admitting: Podiatry

## 2023-04-10 ENCOUNTER — Telehealth: Payer: Self-pay

## 2023-04-10 NOTE — Telephone Encounter (Signed)
-----   Message from Armanda Magic sent at 04/06/2023  8:41 PM EST ----- Chest CTA showed mildly dilated ascending aorta at 3.9cm.  There is evidence of emphysema of the lungs and cholesterol plaque in the aorta>>forward to PCP to review  Get 2D echo 03/2024 for dilated aorta

## 2023-04-10 NOTE — Telephone Encounter (Signed)
Call to patient to discuss CTA results, no answer. Left message per DPR asking patient to call our office to discuss results.

## 2023-04-13 ENCOUNTER — Telehealth: Payer: Self-pay | Admitting: Cardiology

## 2023-04-13 NOTE — Telephone Encounter (Signed)
Returned call to daughter. Unable to leave message. Voicemail not set up

## 2023-04-13 NOTE — Telephone Encounter (Signed)
Patient's daughter is returning RN's call for the pt's CT results.   Please advise.

## 2023-04-16 ENCOUNTER — Telehealth: Payer: Self-pay

## 2023-04-16 DIAGNOSIS — I77810 Thoracic aortic ectasia: Secondary | ICD-10-CM

## 2023-04-16 NOTE — Telephone Encounter (Signed)
Spoke with dtr Hansel Starling regarding chest CTA results which showed mildly dilated ascending aorta at 3.9cm.  Adrienne verbalizes understanding there is evidence of emphysema of the lungs and cholesterol plaque in the aorta and agrees to discuss with PCP. Advised I would forward to PCP to review.    2D echo ordered for 03/2024 for dilated aorta.

## 2023-04-16 NOTE — Telephone Encounter (Signed)
-----   Message from Armanda Magic sent at 04/06/2023  8:41 PM EST ----- Chest CTA showed mildly dilated ascending aorta at 3.9cm.  There is evidence of emphysema of the lungs and cholesterol plaque in the aorta>>forward to PCP to review  Get 2D echo 03/2024 for dilated aorta

## 2023-04-16 NOTE — Telephone Encounter (Signed)
See telephone encounter 04/16/23

## 2023-04-17 ENCOUNTER — Ambulatory Visit (INDEPENDENT_AMBULATORY_CARE_PROVIDER_SITE_OTHER): Payer: Medicare HMO | Admitting: Podiatry

## 2023-04-17 ENCOUNTER — Encounter: Payer: Self-pay | Admitting: Podiatry

## 2023-04-17 DIAGNOSIS — M79675 Pain in left toe(s): Secondary | ICD-10-CM

## 2023-04-17 DIAGNOSIS — M79674 Pain in right toe(s): Secondary | ICD-10-CM | POA: Diagnosis not present

## 2023-04-17 DIAGNOSIS — D696 Thrombocytopenia, unspecified: Secondary | ICD-10-CM

## 2023-04-17 DIAGNOSIS — B351 Tinea unguium: Secondary | ICD-10-CM

## 2023-04-17 NOTE — Progress Notes (Signed)
This patient returns to my office for at risk foot care.  This patient requires this care by a professional since this patient will be at risk due to having CKD and thrombocytopenia.  This patient is unable to cut nails himself since the patient cannot reach his nails.These nails are painful walking and wearing shoes. He presents to the office with his daughter.This patient presents for at risk foot care today.  General Appearance  Alert, conversant and in no acute stress.  Vascular  Dorsalis pedis and posterior tibial  pulses are palpable  bilaterally.  Capillary return is within normal limits  bilaterally. Temperature is within normal limits  bilaterally.  Neurologic  Senn-Weinstein monofilament wire test within normal limits  bilaterally. Muscle power within normal limits bilaterally.  Nails Thick disfigured discolored nails with subungual debris  from hallux to fifth toes bilaterally. No evidence of bacterial infection or drainage bilaterally.  Orthopedic  No limitations of motion  feet .  No crepitus or effusions noted.  No bony pathology or digital deformities noted.  Skin  normotropic skin with no porokeratosis noted bilaterally.  No signs of infections or ulcers noted.     Onychomycosis  Pain in right toes  Pain in left toes  Consent was obtained for treatment procedures.   Mechanical debridement of nails 1-5  bilaterally performed with a nail nipper.  Filed with dremel without incident.    Return office visit     3 months                 Told patient to return for periodic foot care and evaluation due to potential at risk complications.   Jasani Dolney DPM   

## 2023-06-18 DIAGNOSIS — H6123 Impacted cerumen, bilateral: Secondary | ICD-10-CM | POA: Diagnosis not present

## 2023-07-17 ENCOUNTER — Ambulatory Visit: Payer: Medicare HMO | Admitting: Podiatry

## 2023-09-25 ENCOUNTER — Ambulatory Visit (INDEPENDENT_AMBULATORY_CARE_PROVIDER_SITE_OTHER): Admitting: Podiatry

## 2023-09-25 ENCOUNTER — Encounter: Payer: Self-pay | Admitting: Podiatry

## 2023-09-25 DIAGNOSIS — M79675 Pain in left toe(s): Secondary | ICD-10-CM

## 2023-09-25 DIAGNOSIS — M79674 Pain in right toe(s): Secondary | ICD-10-CM

## 2023-09-25 DIAGNOSIS — B351 Tinea unguium: Secondary | ICD-10-CM

## 2023-09-25 DIAGNOSIS — D696 Thrombocytopenia, unspecified: Secondary | ICD-10-CM

## 2023-09-25 NOTE — Progress Notes (Signed)
This patient returns to my office for at risk foot care.  This patient requires this care by a professional since this patient will be at risk due to having CKD and thrombocytopenia.  This patient is unable to cut nails himself since the patient cannot reach his nails.These nails are painful walking and wearing shoes. He presents to the office with his daughter.This patient presents for at risk foot care today.  General Appearance  Alert, conversant and in no acute stress.  Vascular  Dorsalis pedis and posterior tibial  pulses are palpable  bilaterally.  Capillary return is within normal limits  bilaterally. Temperature is within normal limits  bilaterally.  Neurologic  Senn-Weinstein monofilament wire test within normal limits  bilaterally. Muscle power within normal limits bilaterally.  Nails Thick disfigured discolored nails with subungual debris  from hallux to fifth toes bilaterally. No evidence of bacterial infection or drainage bilaterally.  Orthopedic  No limitations of motion  feet .  No crepitus or effusions noted.  No bony pathology or digital deformities noted.  Skin  normotropic skin with no porokeratosis noted bilaterally.  No signs of infections or ulcers noted.     Onychomycosis  Pain in right toes  Pain in left toes  Consent was obtained for treatment procedures.   Mechanical debridement of nails 1-5  bilaterally performed with a nail nipper.  Filed with dremel without incident.    Return office visit     3 months                 Told patient to return for periodic foot care and evaluation due to potential at risk complications.   Jasani Dolney DPM   

## 2023-10-11 DIAGNOSIS — N183 Chronic kidney disease, stage 3 unspecified: Secondary | ICD-10-CM | POA: Diagnosis not present

## 2023-10-11 DIAGNOSIS — R350 Frequency of micturition: Secondary | ICD-10-CM | POA: Diagnosis not present

## 2023-10-11 DIAGNOSIS — R413 Other amnesia: Secondary | ICD-10-CM | POA: Diagnosis not present

## 2023-10-11 DIAGNOSIS — D509 Iron deficiency anemia, unspecified: Secondary | ICD-10-CM | POA: Diagnosis not present

## 2023-10-11 DIAGNOSIS — Z6820 Body mass index (BMI) 20.0-20.9, adult: Secondary | ICD-10-CM | POA: Diagnosis not present

## 2023-10-11 DIAGNOSIS — E785 Hyperlipidemia, unspecified: Secondary | ICD-10-CM | POA: Diagnosis not present

## 2023-10-11 DIAGNOSIS — I10 Essential (primary) hypertension: Secondary | ICD-10-CM | POA: Diagnosis not present

## 2023-10-11 DIAGNOSIS — R627 Adult failure to thrive: Secondary | ICD-10-CM | POA: Diagnosis not present

## 2023-10-11 DIAGNOSIS — R531 Weakness: Secondary | ICD-10-CM | POA: Diagnosis not present

## 2023-10-11 DIAGNOSIS — I2510 Atherosclerotic heart disease of native coronary artery without angina pectoris: Secondary | ICD-10-CM | POA: Diagnosis not present

## 2023-10-11 DIAGNOSIS — E1122 Type 2 diabetes mellitus with diabetic chronic kidney disease: Secondary | ICD-10-CM | POA: Diagnosis not present

## 2023-10-11 DIAGNOSIS — Z0000 Encounter for general adult medical examination without abnormal findings: Secondary | ICD-10-CM | POA: Diagnosis not present

## 2023-11-06 ENCOUNTER — Encounter: Payer: Self-pay | Admitting: Hematology and Oncology

## 2023-11-06 ENCOUNTER — Inpatient Hospital Stay

## 2023-11-06 ENCOUNTER — Inpatient Hospital Stay: Attending: Hematology and Oncology | Admitting: Hematology and Oncology

## 2023-11-06 VITALS — BP 130/84 | HR 90 | Temp 98.0°F | Resp 16 | Wt 143.4 lb

## 2023-11-06 DIAGNOSIS — Z79899 Other long term (current) drug therapy: Secondary | ICD-10-CM | POA: Insufficient documentation

## 2023-11-06 DIAGNOSIS — N183 Chronic kidney disease, stage 3 unspecified: Secondary | ICD-10-CM

## 2023-11-06 DIAGNOSIS — K5909 Other constipation: Secondary | ICD-10-CM | POA: Diagnosis not present

## 2023-11-06 DIAGNOSIS — D509 Iron deficiency anemia, unspecified: Secondary | ICD-10-CM | POA: Diagnosis not present

## 2023-11-06 DIAGNOSIS — Z87891 Personal history of nicotine dependence: Secondary | ICD-10-CM | POA: Diagnosis not present

## 2023-11-06 DIAGNOSIS — D539 Nutritional anemia, unspecified: Secondary | ICD-10-CM

## 2023-11-06 DIAGNOSIS — I252 Old myocardial infarction: Secondary | ICD-10-CM | POA: Diagnosis not present

## 2023-11-06 DIAGNOSIS — I129 Hypertensive chronic kidney disease with stage 1 through stage 4 chronic kidney disease, or unspecified chronic kidney disease: Secondary | ICD-10-CM | POA: Diagnosis not present

## 2023-11-06 DIAGNOSIS — Z7982 Long term (current) use of aspirin: Secondary | ICD-10-CM | POA: Diagnosis not present

## 2023-11-06 DIAGNOSIS — N1831 Chronic kidney disease, stage 3a: Secondary | ICD-10-CM | POA: Diagnosis not present

## 2023-11-06 DIAGNOSIS — M6284 Sarcopenia: Secondary | ICD-10-CM | POA: Insufficient documentation

## 2023-11-06 LAB — CBC WITH DIFFERENTIAL (CANCER CENTER ONLY)
Abs Immature Granulocytes: 0.01 K/uL (ref 0.00–0.07)
Basophils Absolute: 0 K/uL (ref 0.0–0.1)
Basophils Relative: 1 %
Eosinophils Absolute: 0.1 K/uL (ref 0.0–0.5)
Eosinophils Relative: 2 %
HCT: 34 % — ABNORMAL LOW (ref 39.0–52.0)
Hemoglobin: 11 g/dL — ABNORMAL LOW (ref 13.0–17.0)
Immature Granulocytes: 0 %
Lymphocytes Relative: 33 %
Lymphs Abs: 1.6 K/uL (ref 0.7–4.0)
MCH: 25.1 pg — ABNORMAL LOW (ref 26.0–34.0)
MCHC: 32.4 g/dL (ref 30.0–36.0)
MCV: 77.4 fL — ABNORMAL LOW (ref 80.0–100.0)
Monocytes Absolute: 0.3 K/uL (ref 0.1–1.0)
Monocytes Relative: 7 %
Neutro Abs: 2.8 K/uL (ref 1.7–7.7)
Neutrophils Relative %: 57 %
Platelet Count: 199 K/uL (ref 150–400)
RBC: 4.39 MIL/uL (ref 4.22–5.81)
RDW: 16.1 % — ABNORMAL HIGH (ref 11.5–15.5)
WBC Count: 4.8 K/uL (ref 4.0–10.5)
nRBC: 0 % (ref 0.0–0.2)

## 2023-11-06 LAB — CMP (CANCER CENTER ONLY)
ALT: 6 U/L (ref 0–44)
AST: 16 U/L (ref 15–41)
Albumin: 3.4 g/dL — ABNORMAL LOW (ref 3.5–5.0)
Alkaline Phosphatase: 91 U/L (ref 38–126)
Anion gap: 5 (ref 5–15)
BUN: 22 mg/dL (ref 8–23)
CO2: 30 mmol/L (ref 22–32)
Calcium: 9.4 mg/dL (ref 8.9–10.3)
Chloride: 105 mmol/L (ref 98–111)
Creatinine: 1.43 mg/dL — ABNORMAL HIGH (ref 0.61–1.24)
GFR, Estimated: 48 mL/min — ABNORMAL LOW (ref >60)
Glucose, Bld: 134 mg/dL — ABNORMAL HIGH (ref 70–99)
Potassium: 3.7 mmol/L (ref 3.5–5.1)
Sodium: 140 mmol/L (ref 135–145)
Total Bilirubin: 0.4 mg/dL (ref 0.0–1.2)
Total Protein: 7.6 g/dL (ref 6.5–8.1)

## 2023-11-06 LAB — IRON AND IRON BINDING CAPACITY (CC-WL,HP ONLY)
Iron: 48 ug/dL (ref 45–182)
Saturation Ratios: 19 % (ref 17.9–39.5)
TIBC: 251 ug/dL (ref 250–450)
UIBC: 203 ug/dL (ref 117–376)

## 2023-11-06 LAB — SEDIMENTATION RATE: Sed Rate: 28 mm/h — ABNORMAL HIGH (ref 0–16)

## 2023-11-06 LAB — TSH: TSH: 3.17 u[IU]/mL (ref 0.350–4.500)

## 2023-11-06 LAB — VITAMIN D 25 HYDROXY (VIT D DEFICIENCY, FRACTURES): Vit D, 25-Hydroxy: 70.42 ng/mL (ref 30–100)

## 2023-11-06 LAB — VITAMIN B12: Vitamin B-12: 764 pg/mL (ref 180–914)

## 2023-11-06 LAB — RETICULOCYTES
Immature Retic Fract: 16.5 % — ABNORMAL HIGH (ref 2.3–15.9)
RBC.: 4.38 MIL/uL (ref 4.22–5.81)
Retic Count, Absolute: 49.1 K/uL (ref 19.0–186.0)
Retic Ct Pct: 1.1 % (ref 0.4–3.1)

## 2023-11-06 LAB — FERRITIN: Ferritin: 82 ng/mL (ref 24–336)

## 2023-11-06 NOTE — Progress Notes (Signed)
 [facility] CONSULT NOTE  Patient Care Team: [name], MD as PCP - General (Family Medicine) Jeffrie Oneil BROCKS, MD as PCP - Cardiology (Cardiology)  ASSESSMENT & PLAN:  Deficiency anemia The cause of his anemia is multifactorial, likely a combination of iron deficiency anemia and anemia of chronic kidney disease I will order additional labs also to rule out plasma dyscrasias We discussed the role of intravenous iron replacement therapy versus oral iron supplement As the patient is relatively asymptomatic, he is in agreement to try oral iron supplement Due to his background constipation, I recommend daily laxatives while he is on oral iron supplement Plan to see him next month If he makes no improvement with his anemia, we can consider intravenous iron replacement therapy in the future Due to his history of pyloric ulcer, I recommend the patient to refrain from taking any NSAID in the future.  I told his daughter to remove NSAID from home  Chronic kidney disease (CKD), stage III (moderate) (HCC) He has chronic kidney disease There could be a component of anemia chronic kidney disease After adequate iron replacement therapy, he can benefit from ESA  Sarcopenia He has signs of muscle wasting and sarcopenia We discussed importance of changes in his dietary choices and increase oral protein intake  Chronic constipation Likely due to poor mobility and reduced oral fluid intake I recommend gentle laxatives daily to stimulate bowel movement with goal for 1 bowel movement at least every other day Orders Placed This Encounter  Procedures   CMP (Cancer Center only)    Standing Status:   Future    Number of Occurrences:   1    Expiration Date:   11/05/2024   CBC with Differential (Cancer Center Only)    Standing Status:   Future    Number of Occurrences:   1    Expiration Date:   11/05/2024   Ferritin    Standing Status:   Future    Number of Occurrences:   1     Expiration Date:   11/05/2024   Erythropoietin     Standing Status:   Future    Number of Occurrences:   1    Expiration Date:   11/05/2024   Iron and Iron Binding Capacity ([facility] only)    Standing Status:   Future    Number of Occurrences:   1    Expiration Date:   11/05/2024   Vitamin B12    Standing Status:   Future    Number of Occurrences:   1    Expiration Date:   11/05/2024   TSH    Standing Status:   Future    Number of Occurrences:   1    Expiration Date:   11/05/2024   Sedimentation rate    Standing Status:   Future    Number of Occurrences:   1    Expiration Date:   11/05/2024   Reticulocytes    Standing Status:   Future    Number of Occurrences:   1    Expiration Date:   11/05/2024   Kappa/lambda light chains    Standing Status:   Future    Number of Occurrences:   1    Expiration Date:   11/05/2024   Multiple Myeloma Panel (SPEP&IFE w/QIG)    Standing Status:   Future    Number of Occurrences:   1    Expiration Date:   11/05/2024   VITAMIN D  25 Hydroxy (Vit-D  Deficiency, Fractures)    Standing Status:   Future    Number of Occurrences:   1    Expiration Date:   11/05/2024   Ferritin    Standing Status:   Future    Expiration Date:   11/05/2024   Iron and Iron Binding Capacity ([facility] only)    Standing Status:   Future    Expiration Date:   11/05/2024   CBC with Differential (Cancer Center Only)    Standing Status:   Future    Expiration Date:   11/05/2024    All questions were answered. The patient knows to call the clinic with any problems, questions or concerns.  The total time spent in the appointment was 60 minutes encounter with patients including review of chart and various tests results, discussions about plan of care and coordination of care plan  Juan Bedford, MD 8/26/20252:38 PM   CHIEF COMPLAINTS/PURPOSE OF CONSULTATION:  Anemia  HISTORY OF PRESENTING ILLNESS:  Juan Goodwin 88 y.o. male is here because of anemia He is here accompanied by his  daughter due to his cognitive decline He was found to have abnormal CBC from recent blood work I have the opportunity to review his CBC dated back to 2016 However, according to electronic records, he had received intravenous iron infusion in 2013, records not available  On July 10, 2014, he presented with low blood count with a hemoglobin of 5.9 He received blood transfusion and significant investigations including EGD evaluation On July 11, 2014, EGD showed pyloric ulcer with stigmata of bleeding.  It was treated with epinephrine  injection and proton pump inhibitor. Biopsy was taken which show evidence of Helicobacter pylori gastritis with intestinal metaplasia The patient was treated with triple therapy On March 26, 2017, his blood count was normal with hemoglobin of 13.7 He was admitted to the hospital after a fall and CT head show subdural hematoma requiring surgery On June 08, 2018, CBC was normal with hemoglobin of 13 On April 25, 2020, blood count show drop of hemoglobin to 9.1.  The patient was admitted to the hospital due to chest pain and was found to have chronic kidney disease.  He was hospitalized for non-STEMI ST elevation MI, treated with medical management.  At that point in time, he was noted to have iron deficiency anemia and received 1 dose of intravenous iron infusion With IV Feraheme . On October 11, 2020, repeat CBC showed hemoglobin of 12.6 On November 01, 2021, hemoglobin was 11.6 Most recently, he saw his primary care doctor and had repeat labs on October 11, 2023 which showed Hemoglobin 10.2, MCV 77.8, platelet count 147, A1c of 6% and creatinine of 1.7 He denies recent chest pain on exertion, shortness of breath on minimal exertion, pre-syncopal episodes, or palpitations. He had not noticed any recent bleeding such as epistaxis, hematuria or hematochezia The patient has intermittent use of NSAID. He is on antiplatelets agents. His last colonoscopy was several years  ago but he cannot recall the date of his procedure He had no prior history or diagnosis of cancer. His age appropriate screening programs are up-to-date. He denies any pica and eats a variety of diet.  However, he only eats 2 meals a day, typically lunch and dinner and most of his food choices are poor in protein/iron rich meals He had donated blood when he was younger but has not donated since over 20 years ago.  He received blood transfusion in 2016 as above  MEDICAL HISTORY:  Past  Medical History:  Diagnosis Date   Acute pyloric channel ulcer 07/13/2014   Acute upper GI bleed 07/13/2014   Aortic atherosclerosis (HCC)    Ascending aorta dilatation (HCC)    4.3cm by echo 09/2022 and 3.9cm by Chest CT 03/2023.   Blood transfusion without reported diagnosis    CAD (coronary artery disease)    Cerebrovascular disease 10/08/2018   Chronic kidney disease, stage 3a (HCC) 03/30/2017   Dilated aortic root (HCC)    38 mm by echo with 04/2021   Gastrointestinal hemorrhage with melena 06/2014   Global aphasia 03/27/2017   Helicobacter pylori gastritis 07/13/2014   High cholesterol    Hypernatremia 07/13/2014   Hypertension    Hypokalemia 07/13/2014   Normocytic normochromic anemia 04/10/2017   REM sleep behavior disorder 10/08/2018   Subdural hematoma (HCC) 03/26/2017   Thrombocytopenia (HCC) 07/13/2014    SURGICAL HISTORY: Past Surgical History:  Procedure Laterality Date   BRAIN SURGERY     CRANIOTOMY Left 03/26/2017   Procedure: CRANIOTOMY HEMATOMA EVACUATION SUBDURAL;  Surgeon: Onetha Kuba, MD;  Location: [facility] OR;  Service: Neurosurgery;  Laterality: Left;   ESOPHAGOGASTRODUODENOSCOPY N/A 07/11/2014   Procedure: ESOPHAGOGASTRODUODENOSCOPY (EGD);  Surgeon: Jerrell Sol, MD;  Location: [facility];  Service: Endoscopy;  Laterality: N/A;   LEFT HEART CATH AND CORONARY ANGIOGRAPHY N/A 04/26/2020   Procedure: LEFT HEART CATH AND CORONARY ANGIOGRAPHY;  Surgeon: Dann Candyce RAMAN, MD;   Location: [facility] INVASIVE CV LAB;  Service: Cardiovascular;  Laterality: N/A;    SOCIAL HISTORY: Social History   Socioeconomic History   Marital status: Widowed    Spouse name: Not on file   Number of children: 3   Years of education: 14   Highest education level: Not on file  Occupational History   Not on file  Tobacco Use   Smoking status: Former    Current packs/day: 1.00    Average packs/day: 1 pack/day for 35.0 years (35.0 ttl pk-yrs)    Types: Cigarettes   Smokeless tobacco: Never  Substance and Sexual Activity   Alcohol use: No   Drug use: Not Currently   Sexual activity: Never  Other Topics Concern   Not on file  Social History Narrative   Right handed    Occasional cup of coffee    Lives at home alone   Wife is deceased along with 2 children 1 child is living     Social Drivers of Corporate investment banker Strain: Not on file  Food Insecurity: No Food Insecurity (11/08/2021)   Hunger Vital Sign    Worried About Running Out of Food in the Last Year: Never true    Ran Out of Food in the Last Year: Never true  Transportation Needs: No Transportation Needs (11/08/2021)   PRAPARE - Administrator, Civil Service (Medical): No    Lack of Transportation (Non-Medical): No  Physical Activity: Inactive (11/08/2021)   Exercise Vital Sign    Days of Exercise per Week: 0 days    Minutes of Exercise per Session: 0 min  Stress: No Stress Concern Present (11/08/2021)   [employer] of Occupational Health - Occupational Stress Questionnaire    Feeling of Stress : Only a little  Social Connections: Not on file  Intimate Partner Violence: Not on file    FAMILY HISTORY: Family History  Problem Relation Age of Onset   Heart failure Mother     ALLERGIES:  is allergic to doxazosin mesylate.  MEDICATIONS:  Current Outpatient Medications  Medication Sig Dispense Refill   aspirin  EC (ASPIRIN  LOW DOSE) 81 MG tablet Take 1 tablet (81 mg total) by mouth daily.  90 tablet 3   isosorbide  mononitrate (IMDUR ) 30 MG 24 hr tablet TAKE 1 TABLET(30 MG) BY MOUTH DAILY 90 tablet 0   Multiple Vitamins-Minerals (MULTIVITAMIN ADULTS 50+ PO) Take 1 tablet by mouth daily.     nitroGLYCERIN  (NITROSTAT ) 0.4 MG SL tablet Place 1 tablet (0.4 mg total) under the tongue every 5 (five) minutes as needed for chest pain. 25 tablet 11   rosuvastatin  (CRESTOR ) 40 MG tablet Take 1 tablet (40 mg total) by mouth daily. 90 tablet 3   No current facility-administered medications for this visit.    REVIEW OF SYSTEMS: He has chronic constipation.  Usually have 1 bowel movement every 3 to 4 days.  He leads a very sedentary lifestyle Constitutional: Denies fevers, chills or abnormal night sweats Eyes: Denies blurriness of vision, double vision or watery eyes Ears, nose, mouth, throat, and face: Denies mucositis or sore throat Respiratory: Denies cough, dyspnea or wheezes Cardiovascular: Denies palpitation, chest discomfort or lower extremity swelling Skin: Denies abnormal skin rashes Lymphatics: Denies new lymphadenopathy or easy bruising Neurological:Denies numbness, tingling or new weaknesses Behavioral/Psych: Mood is stable, no new changes  All other systems were reviewed with the patient and are negative.  PHYSICAL EXAMINATION: ECOG PERFORMANCE STATUS: 1 - Symptomatic but completely ambulatory  Vitals:   11/06/23 1338  BP: 130/84  Pulse: 90  Resp: 16  Temp: 98 F (36.7 C)  SpO2: 94%   Filed Weights   11/06/23 1338  Weight: 143 lb 6.4 oz (65 kg)    GENERAL:alert, no distress and comfortable.  He looks thin and cachectic with signs of sarcopenia SKIN: skin color, texture, turgor are normal, no rashes or significant lesions EYES: normal, conjunctiva are pink and non-injected, sclera clear OROPHARYNX:no exudate, no erythema and lips, buccal mucosa, and tongue normal  NECK: supple, thyroid  normal size, non-tender, without nodularity LYMPH:  no palpable  lymphadenopathy in the cervical, axillary or inguinal LUNGS: clear to auscultation and percussion with normal breathing effort HEART: regular rate & rhythm and no murmurs and no lower extremity edema ABDOMEN:abdomen soft, non-tender and normal bowel sounds Musculoskeletal:no cyanosis of digits and no clubbing  PSYCH: alert & oriented x 3 with fluent speech NEURO: no focal motor/sensory deficits

## 2023-11-06 NOTE — Assessment & Plan Note (Signed)
 He has signs of muscle wasting and sarcopenia We discussed importance of changes in his dietary choices and increase oral protein intake

## 2023-11-06 NOTE — Assessment & Plan Note (Signed)
 He has chronic kidney disease There could be a component of anemia chronic kidney disease After adequate iron replacement therapy, he can benefit from ESA

## 2023-11-06 NOTE — Assessment & Plan Note (Addendum)
 The cause of his anemia is multifactorial, likely a combination of iron deficiency anemia and anemia of chronic kidney disease I will order additional labs also to rule out plasma dyscrasias We discussed the role of intravenous iron replacement therapy versus oral iron supplement As the patient is relatively asymptomatic, he is in agreement to try oral iron supplement Due to his background constipation, I recommend daily laxatives while he is on oral iron supplement Plan to see him next month If he makes no improvement with his anemia, we can consider intravenous iron replacement therapy in the future Due to his history of pyloric ulcer, I recommend the patient to refrain from taking any NSAID in the future.  I told his daughter to remove NSAID from home

## 2023-11-06 NOTE — Assessment & Plan Note (Signed)
 Likely due to poor mobility and reduced oral fluid intake I recommend gentle laxatives daily to stimulate bowel movement with goal for 1 bowel movement at least every other day

## 2023-11-07 LAB — KAPPA/LAMBDA LIGHT CHAINS
Kappa free light chain: 16 mg/L (ref 3.3–19.4)
Kappa, lambda light chain ratio: 0.96 (ref 0.26–1.65)
Lambda free light chains: 16.6 mg/L (ref 5.7–26.3)

## 2023-11-08 LAB — ERYTHROPOIETIN: Erythropoietin: 7.2 m[IU]/mL (ref 2.6–18.5)

## 2023-11-09 LAB — MULTIPLE MYELOMA PANEL, SERUM
Albumin SerPl Elph-Mcnc: 3.1 g/dL (ref 2.9–4.4)
Albumin/Glob SerPl: 0.8 (ref 0.7–1.7)
Alpha 1: 0.3 g/dL (ref 0.0–0.4)
Alpha2 Glob SerPl Elph-Mcnc: 0.8 g/dL (ref 0.4–1.0)
B-Globulin SerPl Elph-Mcnc: 1.1 g/dL (ref 0.7–1.3)
Gamma Glob SerPl Elph-Mcnc: 2 g/dL — ABNORMAL HIGH (ref 0.4–1.8)
Globulin, Total: 4.2 g/dL — ABNORMAL HIGH (ref 2.2–3.9)
IgA: 468 mg/dL — ABNORMAL HIGH (ref 61–437)
IgG (Immunoglobin G), Serum: 2251 mg/dL — ABNORMAL HIGH (ref 603–1613)
IgM (Immunoglobulin M), Srm: 115 mg/dL (ref 15–143)
Total Protein ELP: 7.3 g/dL (ref 6.0–8.5)

## 2023-11-14 ENCOUNTER — Ambulatory Visit

## 2023-11-15 ENCOUNTER — Ambulatory Visit: Attending: Family Medicine

## 2023-11-15 DIAGNOSIS — R2689 Other abnormalities of gait and mobility: Secondary | ICD-10-CM | POA: Diagnosis not present

## 2023-11-15 DIAGNOSIS — M6281 Muscle weakness (generalized): Secondary | ICD-10-CM | POA: Diagnosis not present

## 2023-11-15 NOTE — Therapy (Signed)
 OUTPATIENT PHYSICAL THERAPY LOWER EXTREMITY EVALUATION   Patient Name: Juan Goodwin MRN: [id number] DOB:06-07-1935, 88 y.o., male Today's Date: 11/15/2023  END OF SESSION:  PT End of Session - 11/15/23 1629     Visit Number 1    Date for PT Re-Evaluation 01/24/24    Authorization Type [employer]    PT Start Time 1630    PT Stop Time 1705    PT Time Calculation (min) 35 min          Past Medical History:  Diagnosis Date   Acute pyloric channel ulcer 07/13/2014   Acute upper GI bleed 07/13/2014   Aortic atherosclerosis (HCC)    Ascending aorta dilatation (HCC)    4.3cm by echo 09/2022 and 3.9cm by Chest CT 03/2023.   Blood transfusion without reported diagnosis    CAD (coronary artery disease)    Cerebrovascular disease 10/08/2018   Chronic kidney disease, stage 3a (HCC) 03/30/2017   Dilated aortic root (HCC)    38 mm by echo with 04/2021   Gastrointestinal hemorrhage with melena 06/2014   Global aphasia 03/27/2017   Helicobacter pylori gastritis 07/13/2014   High cholesterol    Hypernatremia 07/13/2014   Hypertension    Hypokalemia 07/13/2014   Normocytic normochromic anemia 04/10/2017   REM sleep behavior disorder 10/08/2018   Subdural hematoma (HCC) 03/26/2017   Thrombocytopenia (HCC) 07/13/2014   Past Surgical History:  Procedure Laterality Date   BRAIN SURGERY     CRANIOTOMY Left 03/26/2017   Procedure: CRANIOTOMY HEMATOMA EVACUATION SUBDURAL;  Surgeon: Onetha Kuba, MD;  Location: [facility] OR;  Service: Neurosurgery;  Laterality: Left;   ESOPHAGOGASTRODUODENOSCOPY N/A 07/11/2014   Procedure: ESOPHAGOGASTRODUODENOSCOPY (EGD);  Surgeon: Jerrell Sol, MD;  Location: [facility];  Service: Endoscopy;  Laterality: N/A;   LEFT HEART CATH AND CORONARY ANGIOGRAPHY N/A 04/26/2020   Procedure: LEFT HEART CATH AND CORONARY ANGIOGRAPHY;  Surgeon: Dann Candyce RAMAN, MD;  Location[facility] INVASIVE CV LAB;  Service: Cardiovascular;  Laterality: N/A;   Patient Active Problem  List   Diagnosis Date Noted   Deficiency anemia 11/06/2023   Sarcopenia 11/06/2023   Chronic constipation 11/06/2023   Aortic atherosclerosis (HCC)    Ascending aorta dilatation (HCC)    CAD (coronary artery disease) 10/12/2020   Angina at rest [facility]) 10/11/2020   Elevated troponin    Chest pain 04/25/2020   NSTEMI (non-ST elevated myocardial infarction) (HCC) 04/25/2020   Hyperlipidemia 08/08/2019   Cerebrovascular disease 10/08/2018   REM sleep behavior disorder 10/08/2018   Weakness 04/10/2017   Dehydration 04/10/2017   Acute kidney injury superimposed on chronic kidney disease (HCC) 04/10/2017   Normocytic normochromic anemia 04/10/2017   Chronic kidney disease (CKD), stage III (moderate) (HCC) 03/30/2017   HTN (hypertension), malignant 03/29/2017   Orthostatic hypotension 03/27/2017   Global aphasia 03/27/2017   Subdural hematoma (HCC) 03/26/2017   Acute pyloric channel ulcer 07/13/2014   Hypokalemia 07/13/2014   Acute upper GI bleed 07/13/2014   ARF (acute renal failure) (HCC) 07/13/2014   Hypernatremia 07/13/2014   Hypotension 07/13/2014   Thrombocytopenia (HCC) 07/13/2014   Helicobacter pylori gastritis 07/13/2014   Gastrointestinal hemorrhage with melena    Melena 07/11/2014   GI bleed 07/11/2014   Acute blood loss anemia 07/10/2014    PCP: Carlin Gull  REFERRING PROVIDER: Carlin Gull  REFERRING DIAG: R53.1 (ICD-10-CM) - Weakness   THERAPY DIAG:  No diagnosis found.  Rationale for Evaluation and Treatment: Rehabilitation  ONSET DATE: 10/19/23 referral date  SUBJECTIVE:   SUBJECTIVE STATEMENT:  Daughter reports his doctor sent him here because he does not have a lot of physical activity a lot.   PERTINENT HISTORY: See above   PAIN:  Are you having pain? No  PRECAUTIONS: Fall  RED FLAGS: None   WEIGHT BEARING RESTRICTIONS: No  FALLS:  Has patient fallen in last 6 months? No  LIVING ENVIRONMENT: Lives with: lives with their daughter Lives  in: House/apartment Stairs: No Has following equipment at home: Single point cane  OCCUPATION: Retired  PLOF: Independent with basic ADLs and Independent with gait  PATIENT GOALS: find out why they sent me here and do whatever I need to   NEXT MD VISIT:   OBJECTIVE:  Note: Objective measures were completed at Evaluation unless otherwise noted.  DIAGNOSTIC FINDINGS: N/A  COGNITION: Overall cognitive status: Within functional limits for tasks assessed and slower processing, daughter is here to fill in subjective pieces      SENSATION: WFL   POSTURE: rounded shoulders, forward head, and flexed trunk   LOWER EXTREMITY ROM: grossly WFL   LOWER EXTREMITY MMT:  MMT Right eval Left eval  Hip flexion 3 3-  Hip extension    Hip abduction 4 4  Hip adduction    Hip internal rotation    Hip external rotation    Knee flexion 4 4  Knee extension 4 3+  Ankle dorsiflexion    Ankle plantarflexion    Ankle inversion    Ankle eversion     (Blank rows = not tested)   FUNCTIONAL TESTS:  5 times sit to stand: 27s with push off  Timed up and go (TUG): 24s  GAIT: Distance walked: in clinic distances Assistive device utilized: Single point cane Level of assistance: Modified independence Comments: slowed speed, decreased foot clearance, flexed trunk                                                                                                                                TREATMENT DATE: 11/15/23- EVAL, HEP    PATIENT EDUCATION:  Education details: HEP and POC Person educated: Patient and Child(ren) Education method: Medical illustrator Education comprehension: verbalized understanding, returned demonstration, verbal cues required, and tactile cues required  HOME EXERCISE PROGRAM: Access Code: [email or web address] URL: [email or web address] Date: 11/15/2023 Prepared by: Almetta Fam  Exercises - Sit to Stand with Armchair  - 1 x daily - 7 x weekly - 2 sets  - 10 reps - Standing March with Counter Support  - 1 x daily - 7 x weekly - 2 sets - 10 reps - Standing Hip Abduction with Counter Support  - 1 x daily - 7 x weekly - 2 sets - 10 reps - Heel Raises with Counter Support  - 1 x daily - 7 x weekly - 2 sets - 10 reps - Seated Hip Abduction with Resistance  - 1 x daily - 7 x weekly - 2 sets - 10 reps - Seated March with  Resistance  - 1 x daily - 7 x weekly - 2 sets - 10 reps  ASSESSMENT:  CLINICAL IMPRESSION: Patient is a 88 y.o. male who was seen today for physical therapy evaluation and treatment for weakness. He has some weakness in his lower extremities, especially with hip flexion. The LLE is weaker than the R. He has been ambulating with a cane for about a year. Patient lives with daughter, and she reports that he is sedentary and lack physical activity. He will benefit from skilled PT to address his strength deficits to improve functional independence and decrease his risk for falls. Patient is hard of hearing and has some delayed processing.   OBJECTIVE IMPAIRMENTS: Abnormal gait, decreased balance, decreased strength, and decreased safety awareness.   ACTIVITY LIMITATIONS: sitting, standing, squatting, stairs, and locomotion level  PERSONAL FACTORS: Age and Fitness are also affecting patient's functional outcome.   REHAB POTENTIAL: Good  CLINICAL DECISION MAKING: Stable/uncomplicated  EVALUATION COMPLEXITY: Low  GOALS: Goals reviewed with patient? Yes  SHORT TERM GOALS: Target date: 12/20/23  Patient will be independent with initial HEP. Baseline:  Goal status: INITIAL  2.  Patient will demonstrate decreased fall risk by scoring < 20 sec on TUG. Baseline: 24s Goal status: INITIAL  3.  Patient will be educated on strategies to decrease risk of falls.  Baseline:  Goal status: INITIAL   LONG TERM GOALS: Target date: 01/24/24  Patient will be independent with advanced/ongoing HEP to improve outcomes and carryover.   Baseline:  Goal status: INITIAL  2.  Patient will complete 5xSTS <20s without UE use Baseline: 27s with push off  Goal status: INITIAL  3.  Patient will demonstrate improved functional LE strength as demonstrated by 5/5 in BLE. Baseline:  Goal status: INITIAL  4.  Patient will demonstrate decreased fall risk by scoring < 15 sec on TUG. Baseline: 24s Goal status: INITIAL    PLAN:  PT FREQUENCY: 2x/week  PT DURATION: 10 weeks  PLANNED INTERVENTIONS: 97110-Therapeutic exercises, 97530- Therapeutic activity, [id number]- Neuromuscular re-education, 97535- Self Care, [id number]- Manual therapy, [phone]- Gait training, Patient/Family education, Balance training, Stair training, Cryotherapy, and Moist heat  PLAN FOR NEXT SESSION: LE and functional strengthening    Almetta Fam, PT 11/15/2023, 5:09 PM

## 2023-11-21 ENCOUNTER — Other Ambulatory Visit: Payer: Self-pay

## 2023-11-21 DIAGNOSIS — R634 Abnormal weight loss: Secondary | ICD-10-CM

## 2023-11-21 DIAGNOSIS — Z8719 Personal history of other diseases of the digestive system: Secondary | ICD-10-CM | POA: Diagnosis not present

## 2023-11-21 DIAGNOSIS — D509 Iron deficiency anemia, unspecified: Secondary | ICD-10-CM | POA: Diagnosis not present

## 2023-11-21 DIAGNOSIS — Z8679 Personal history of other diseases of the circulatory system: Secondary | ICD-10-CM | POA: Diagnosis not present

## 2023-11-21 DIAGNOSIS — N189 Chronic kidney disease, unspecified: Secondary | ICD-10-CM | POA: Diagnosis not present

## 2023-11-21 DIAGNOSIS — K5900 Constipation, unspecified: Secondary | ICD-10-CM | POA: Diagnosis not present

## 2023-11-21 DIAGNOSIS — H9190 Unspecified hearing loss, unspecified ear: Secondary | ICD-10-CM | POA: Diagnosis not present

## 2023-11-26 ENCOUNTER — Ambulatory Visit

## 2023-11-26 ENCOUNTER — Other Ambulatory Visit: Payer: Self-pay

## 2023-11-26 DIAGNOSIS — R2689 Other abnormalities of gait and mobility: Secondary | ICD-10-CM

## 2023-11-26 DIAGNOSIS — M6281 Muscle weakness (generalized): Secondary | ICD-10-CM | POA: Diagnosis not present

## 2023-11-26 NOTE — Therapy (Signed)
 OUTPATIENT PHYSICAL THERAPY LOWER EXTREMITY TREATMENT   Patient Name: Juan Goodwin MRN: [id number] DOB:1935/10/07, 88 y.o., male Today's Date: 11/26/2023  END OF SESSION:  PT End of Session - 11/26/23 1558     Visit Number 2    Date for PT Re-Evaluation 01/24/24    Authorization Type [employer]    Progress Note Due on Visit 10    PT Start Time 1557    PT Stop Time 1640    PT Time Calculation (min) 43 min    Activity Tolerance Patient tolerated treatment well           Past Medical History:  Diagnosis Date   Acute pyloric channel ulcer 07/13/2014   Acute upper GI bleed 07/13/2014   Aortic atherosclerosis (HCC)    Ascending aorta dilatation (HCC)    4.3cm by echo 09/2022 and 3.9cm by Chest CT 03/2023.   Blood transfusion without reported diagnosis    CAD (coronary artery disease)    Cerebrovascular disease 10/08/2018   Chronic kidney disease, stage 3a (HCC) 03/30/2017   Dilated aortic root (HCC)    38 mm by echo with 04/2021   Gastrointestinal hemorrhage with melena 06/2014   Global aphasia 03/27/2017   Helicobacter pylori gastritis 07/13/2014   High cholesterol    Hypernatremia 07/13/2014   Hypertension    Hypokalemia 07/13/2014   Normocytic normochromic anemia 04/10/2017   REM sleep behavior disorder 10/08/2018   Subdural hematoma (HCC) 03/26/2017   Thrombocytopenia (HCC) 07/13/2014   Past Surgical History:  Procedure Laterality Date   BRAIN SURGERY     CRANIOTOMY Left 03/26/2017   Procedure: CRANIOTOMY HEMATOMA EVACUATION SUBDURAL;  Surgeon: Onetha Kuba, MD;  Location: [facility] OR;  Service: Neurosurgery;  Laterality: Left;   ESOPHAGOGASTRODUODENOSCOPY N/A 07/11/2014   Procedure: ESOPHAGOGASTRODUODENOSCOPY (EGD);  Surgeon: Jerrell Sol, MD;  Location: [facility];  Service: Endoscopy;  Laterality: N/A;   LEFT HEART CATH AND CORONARY ANGIOGRAPHY N/A 04/26/2020   Procedure: LEFT HEART CATH AND CORONARY ANGIOGRAPHY;  Surgeon: Dann Candyce RAMAN, MD;  Location:  [facility] INVASIVE CV LAB;  Service: Cardiovascular;  Laterality: N/A;   Patient Active Problem List   Diagnosis Date Noted   Deficiency anemia 11/06/2023   Sarcopenia 11/06/2023   Chronic constipation 11/06/2023   Aortic atherosclerosis (HCC)    Ascending aorta dilatation (HCC)    CAD (coronary artery disease) 10/12/2020   Angina at rest [facility]) 10/11/2020   Elevated troponin    Chest pain 04/25/2020   NSTEMI (non-ST elevated myocardial infarction) (HCC) 04/25/2020   Hyperlipidemia 08/08/2019   Cerebrovascular disease 10/08/2018   REM sleep behavior disorder 10/08/2018   Weakness 04/10/2017   Dehydration 04/10/2017   Acute kidney injury superimposed on chronic kidney disease (HCC) 04/10/2017   Normocytic normochromic anemia 04/10/2017   Chronic kidney disease (CKD), stage III (moderate) (HCC) 03/30/2017   HTN (hypertension), malignant 03/29/2017   Orthostatic hypotension 03/27/2017   Global aphasia 03/27/2017   Subdural hematoma (HCC) 03/26/2017   Acute pyloric channel ulcer 07/13/2014   Hypokalemia 07/13/2014   Acute upper GI bleed 07/13/2014   ARF (acute renal failure) (HCC) 07/13/2014   Hypernatremia 07/13/2014   Hypotension 07/13/2014   Thrombocytopenia (HCC) 07/13/2014   Helicobacter pylori gastritis 07/13/2014   Gastrointestinal hemorrhage with melena    Melena 07/11/2014   GI bleed 07/11/2014   Acute blood loss anemia 07/10/2014    PCP: Carlin Gull  REFERRING PROVIDER: Carlin Gull  REFERRING DIAG: R53.1 (ICD-10-CM) - Weakness   THERAPY DIAG:  Muscle weakness (generalized)  Other abnormalities of gait and mobility  Rationale for Evaluation and Treatment: Rehabilitation  ONSET DATE: 10/19/23 referral date  SUBJECTIVE:   SUBJECTIVE STATEMENT:11/26/23: no new complaints  Daughter reports his doctor sent him here because he does not have a lot of physical activity a lot.   PERTINENT HISTORY: See above   PAIN:  Are you having pain? No  PRECAUTIONS:  Fall  RED FLAGS: None   WEIGHT BEARING RESTRICTIONS: No  FALLS:  Has patient fallen in last 6 months? No  LIVING ENVIRONMENT: Lives with: lives with their daughter Lives in: House/apartment Stairs: No Has following equipment at home: Single point cane  OCCUPATION: Retired  PLOF: Independent with basic ADLs and Independent with gait  PATIENT GOALS: find out why they sent me here and do whatever I need to   NEXT MD VISIT:   OBJECTIVE:  Note: Objective measures were completed at Evaluation unless otherwise noted.  DIAGNOSTIC FINDINGS: N/A  COGNITION: Overall cognitive status: Within functional limits for tasks assessed and slower processing, daughter is here to fill in subjective pieces      SENSATION: WFL   POSTURE: rounded shoulders, forward head, and flexed trunk   LOWER EXTREMITY ROM: grossly WFL   LOWER EXTREMITY MMT:  MMT Right eval Left eval  Hip flexion 3 3-  Hip extension    Hip abduction 4 4  Hip adduction    Hip internal rotation    Hip external rotation    Knee flexion 4 4  Knee extension 4 3+  Ankle dorsiflexion    Ankle plantarflexion    Ankle inversion    Ankle eversion     (Blank rows = not tested)   FUNCTIONAL TESTS:  5 times sit to stand: 27s with push off  Timed up and go (TUG): 24s  GAIT: Distance walked: in clinic distances Assistive device utilized: Single point cane Level of assistance: Modified independence Comments: slowed speed, decreased foot clearance, flexed trunk                                                                                                                                TREATMENT DATE:  11/26/23:  Nustep level 5 x 6 min Ue's and LE's B knee ext 5# 1 x 10 B knee flexion 25# 1 x 10  Standing high marches  Standing B heel raises B hand support Sit to stand from elevated mat, 10 x hands on knees  Seated rows, blue band, 15 reps Seated B shoulder ER yellow band 15 reps Standing forward punches  with 2# therabar 10 reps   Gait with st cane 2 laps, 200'  Leg press 20# 10 x 1   11/15/23- EVAL, HEP    PATIENT EDUCATION:  Education details: HEP and POC Person educated: Patient and Child(ren) Education method: Medical illustrator Education comprehension: verbalized understanding, returned demonstration, verbal cues required, and tactile cues required  HOME EXERCISE PROGRAM: Access Code: [email or web address] URL:  [email or web address] Date: 11/15/2023 Prepared by: Almetta Fam  Exercises - Sit to Stand with Armchair  - 1 x daily - 7 x weekly - 2 sets - 10 reps - Standing March with Counter Support  - 1 x daily - 7 x weekly - 2 sets - 10 reps - Standing Hip Abduction with Counter Support  - 1 x daily - 7 x weekly - 2 sets - 10 reps - Heel Raises with Counter Support  - 1 x daily - 7 x weekly - 2 sets - 10 reps - Seated Hip Abduction with Resistance  - 1 x daily - 7 x weekly - 2 sets - 10 reps - Seated March with Resistance  - 1 x daily - 7 x weekly - 2 sets - 10 reps  ASSESSMENT:  CLINICAL IMPRESSION: 11/26/23:  Today was the first treatment session following PT initial evaluation.  He was directed through a series of bulk strengthening as well as endurance training.  Fatigued quickly with walking, after 200' visibly fatigued and staggering.  Should benefit from ongoing PT . Patient is a 88 y.o. male who was seen today for physical therapy evaluation and treatment for weakness. He has some weakness in his lower extremities, especially with hip flexion. The LLE is weaker than the R. He has been ambulating with a cane for about a year. Patient lives with daughter, and she reports that he is sedentary and lack physical activity. He will benefit from skilled PT to address his strength deficits to improve functional independence and decrease his risk for falls. Patient is hard of hearing and has some delayed processing.   OBJECTIVE IMPAIRMENTS: Abnormal gait, decreased balance,  decreased strength, and decreased safety awareness.   ACTIVITY LIMITATIONS: sitting, standing, squatting, stairs, and locomotion level  PERSONAL FACTORS: Age and Fitness are also affecting patient's functional outcome.   REHAB POTENTIAL: Good  CLINICAL DECISION MAKING: Stable/uncomplicated  EVALUATION COMPLEXITY: Low  GOALS: Goals reviewed with patient? Yes  SHORT TERM GOALS: Target date: 12/20/23  Patient will be independent with initial HEP. Baseline:  Goal status: INITIAL  2.  Patient will demonstrate decreased fall risk by scoring < 20 sec on TUG. Baseline: 24s Goal status: INITIAL  3.  Patient will be educated on strategies to decrease risk of falls.  Baseline:  Goal status: INITIAL   LONG TERM GOALS: Target date: 01/24/24  Patient will be independent with advanced/ongoing HEP to improve outcomes and carryover.  Baseline:  Goal status: INITIAL  2.  Patient will complete 5xSTS <20s without UE use Baseline: 27s with push off  Goal status: INITIAL  3.  Patient will demonstrate improved functional LE strength as demonstrated by 5/5 in BLE. Baseline:  Goal status: INITIAL  4.  Patient will demonstrate decreased fall risk by scoring < 15 sec on TUG. Baseline: 24s Goal status: INITIAL    PLAN:  PT FREQUENCY: 2x/week  PT DURATION: 10 weeks  PLANNED INTERVENTIONS: 97110-Therapeutic exercises, 97530- Therapeutic activity, [id number]- Neuromuscular re-education, 97535- Self Care, [id number]- Manual therapy, [phone]- Gait training, Patient/Family education, Balance training, Stair training, Cryotherapy, and Moist heat  PLAN FOR NEXT SESSION: LE and functional strengthening    Emmerich Cryer L Tavion Senkbeil, PT, DPT, OCS 11/26/2023, 4:41 PM

## 2023-11-27 ENCOUNTER — Ambulatory Visit
Admission: RE | Admit: 2023-11-27 | Discharge: 2023-11-27 | Disposition: A | Discharge status: Home / Self Care | Source: Ambulatory Visit

## 2023-11-27 DIAGNOSIS — K5730 Diverticulosis of large intestine without perforation or abscess without bleeding: Secondary | ICD-10-CM | POA: Diagnosis not present

## 2023-11-27 DIAGNOSIS — R634 Abnormal weight loss: Secondary | ICD-10-CM

## 2023-11-28 ENCOUNTER — Ambulatory Visit

## 2023-11-28 ENCOUNTER — Other Ambulatory Visit: Payer: Self-pay

## 2023-11-28 DIAGNOSIS — M6281 Muscle weakness (generalized): Secondary | ICD-10-CM | POA: Diagnosis not present

## 2023-11-28 DIAGNOSIS — R2689 Other abnormalities of gait and mobility: Secondary | ICD-10-CM | POA: Diagnosis not present

## 2023-11-28 NOTE — Therapy (Signed)
 OUTPATIENT PHYSICAL THERAPY LOWER EXTREMITY TREATMENT   Patient Name: Juan Goodwin MRN: [id number] DOB:16-Aug-1935, 88 y.o., male Today's Date: 11/28/2023  END OF SESSION:  PT End of Session - 11/28/23 1510     Visit Number 3    Date for PT Re-Evaluation 01/24/24    Authorization Type [employer] Medicare    Progress Note Due on Visit 10    PT Start Time 1510    PT Stop Time 1556    PT Time Calculation (min) 46 min    Activity Tolerance Patient tolerated treatment well    Behavior During Therapy [facility] for tasks assessed/performed            Past Medical History:  Diagnosis Date   Acute pyloric channel ulcer 07/13/2014   Acute upper GI bleed 07/13/2014   Aortic atherosclerosis (HCC)    Ascending aorta dilatation (HCC)    4.3cm by echo 09/2022 and 3.9cm by Chest CT 03/2023.   Blood transfusion without reported diagnosis    CAD (coronary artery disease)    Cerebrovascular disease 10/08/2018   Chronic kidney disease, stage 3a (HCC) 03/30/2017   Dilated aortic root (HCC)    38 mm by echo with 04/2021   Gastrointestinal hemorrhage with melena 06/2014   Global aphasia 03/27/2017   Helicobacter pylori gastritis 07/13/2014   High cholesterol    Hypernatremia 07/13/2014   Hypertension    Hypokalemia 07/13/2014   Normocytic normochromic anemia 04/10/2017   REM sleep behavior disorder 10/08/2018   Subdural hematoma (HCC) 03/26/2017   Thrombocytopenia (HCC) 07/13/2014   Past Surgical History:  Procedure Laterality Date   BRAIN SURGERY     CRANIOTOMY Left 03/26/2017   Procedure: CRANIOTOMY HEMATOMA EVACUATION SUBDURAL;  Surgeon: Onetha Kuba, MD;  Location: [facility] OR;  Service: Neurosurgery;  Laterality: Left;   ESOPHAGOGASTRODUODENOSCOPY N/A 07/11/2014   Procedure: ESOPHAGOGASTRODUODENOSCOPY (EGD);  Surgeon: Jerrell Sol, MD;  Location: [facility];  Service: Endoscopy;  Laterality: N/A;   LEFT HEART CATH AND CORONARY ANGIOGRAPHY N/A 04/26/2020   Procedure: LEFT HEART CATH AND  CORONARY ANGIOGRAPHY;  Surgeon: Dann Candyce RAMAN, MD;  Location: [facility] INVASIVE CV LAB;  Service: Cardiovascular;  Laterality: N/A;   Patient Active Problem List   Diagnosis Date Noted   Deficiency anemia 11/06/2023   Sarcopenia 11/06/2023   Chronic constipation 11/06/2023   Aortic atherosclerosis (HCC)    Ascending aorta dilatation (HCC)    CAD (coronary artery disease) 10/12/2020   Angina at rest [facility]) 10/11/2020   Elevated troponin    Chest pain 04/25/2020   NSTEMI (non-ST elevated myocardial infarction) (HCC) 04/25/2020   Hyperlipidemia 08/08/2019   Cerebrovascular disease 10/08/2018   REM sleep behavior disorder 10/08/2018   Weakness 04/10/2017   Dehydration 04/10/2017   Acute kidney injury superimposed on chronic kidney disease (HCC) 04/10/2017   Normocytic normochromic anemia 04/10/2017   Chronic kidney disease (CKD), stage III (moderate) (HCC) 03/30/2017   HTN (hypertension), malignant 03/29/2017   Orthostatic hypotension 03/27/2017   Global aphasia 03/27/2017   Subdural hematoma (HCC) 03/26/2017   Acute pyloric channel ulcer 07/13/2014   Hypokalemia 07/13/2014   Acute upper GI bleed 07/13/2014   ARF (acute renal failure) (HCC) 07/13/2014   Hypernatremia 07/13/2014   Hypotension 07/13/2014   Thrombocytopenia (HCC) 07/13/2014   Helicobacter pylori gastritis 07/13/2014   Gastrointestinal hemorrhage with melena    Melena 07/11/2014   GI bleed 07/11/2014   Acute blood loss anemia 07/10/2014    PCP: Carlin Gull  REFERRING PROVIDER: Carlin Gull  REFERRING DIAG:  R53.1 (ICD-10-CM) - Weakness   THERAPY DIAG:  Other abnormalities of gait and mobility  Muscle weakness (generalized)  Rationale for Evaluation and Treatment: Rehabilitation  ONSET DATE: 10/19/23 referral date  SUBJECTIVE:   SUBJECTIVE STATEMENT:11/28/23: no new complaints, no unusual soreness or fatigue after initial session this week.  Daughter reports his doctor sent him here because he does not  have a lot of physical activity a lot.   PERTINENT HISTORY: See above   PAIN:  Are you having pain? No  PRECAUTIONS: Fall  RED FLAGS: None   WEIGHT BEARING RESTRICTIONS: No  FALLS:  Has patient fallen in last 6 months? No  LIVING ENVIRONMENT: Lives with: lives with their daughter Lives in: House/apartment Stairs: No Has following equipment at home: Single point cane  OCCUPATION: Retired  PLOF: Independent with basic ADLs and Independent with gait  PATIENT GOALS: find out why they sent me here and do whatever I need to   NEXT MD VISIT:   OBJECTIVE:  Note: Objective measures were completed at Evaluation unless otherwise noted.  DIAGNOSTIC FINDINGS: N/A  COGNITION: Overall cognitive status: Within functional limits for tasks assessed and slower processing, daughter is here to fill in subjective pieces      SENSATION: WFL   POSTURE: rounded shoulders, forward head, and flexed trunk   LOWER EXTREMITY ROM: grossly WFL   LOWER EXTREMITY MMT:  MMT Right eval Left eval  Hip flexion 3 3-  Hip extension    Hip abduction 4 4  Hip adduction    Hip internal rotation    Hip external rotation    Knee flexion 4 4  Knee extension 4 3+  Ankle dorsiflexion    Ankle plantarflexion    Ankle inversion    Ankle eversion     (Blank rows = not tested)   FUNCTIONAL TESTS:  5 times sit to stand: 27s with push off  Timed up and go (TUG): 24s  GAIT: Distance walked: in clinic distances Assistive device utilized: Single point cane Level of assistance: Modified independence Comments: slowed speed, decreased foot clearance, flexed trunk                                                                                                                                TREATMENT DATE:  11/28/23:  Nustep L 5 x 6 min Ue's and LE's Standing B heel raises 10 x 2  Seated rows 20# 15x Seated B knee ext 5# 10x  Seated B knee flexion 25# 2 x 10 Seated yellow theraband B shoulder  ER 15 x Gait x 300', 3 laps with st cane  Leg press B  20 # 2 x 10 Standing forward punches with 2# therabar 10 x Standing alt high marches 12 x  11/26/23:  Nustep level 5 x 6 min Ue's and LE's B knee ext 5# 1 x 10 B knee flexion 25# 1 x 10  Standing high marches  Standing B  heel raises B hand support Sit to stand from elevated mat, 10 x hands on knees  Seated rows, blue band, 15 reps Seated B shoulder ER yellow band 15 reps Standing forward punches with 2# therabar 10 reps   Gait with st cane 2 laps, 200'  Leg press 20# 10 x 1   11/15/23- EVAL, HEP    PATIENT EDUCATION:  Education details: HEP and POC Person educated: Patient and Child(ren) Education method: Medical illustrator Education comprehension: verbalized understanding, returned demonstration, verbal cues required, and tactile cues required  HOME EXERCISE PROGRAM: Access Code: [email or web address] URL: [email or web address] Date: 11/15/2023 Prepared by: Almetta Fam  Exercises - Sit to Stand with Armchair  - 1 x daily - 7 x weekly - 2 sets - 10 reps - Standing March with Counter Support  - 1 x daily - 7 x weekly - 2 sets - 10 reps - Standing Hip Abduction with Counter Support  - 1 x daily - 7 x weekly - 2 sets - 10 reps - Heel Raises with Counter Support  - 1 x daily - 7 x weekly - 2 sets - 10 reps - Seated Hip Abduction with Resistance  - 1 x daily - 7 x weekly - 2 sets - 10 reps - Seated March with Resistance  - 1 x daily - 7 x weekly - 2 sets - 10 reps  ASSESSMENT:  CLINICAL IMPRESSION: 11/28/23:  Today was the second treatment session following PT initial evaluation.  He was directed through a series of bulk strengthening as well as endurance training.  He performed more activities today  still fatigued quickly.  Should benefit from ongoing PT . Patient is a 88 y.o. male who was seen today for physical therapy evaluation and treatment for weakness. He has some weakness in his lower extremities,  especially with hip flexion. The LLE is weaker than the R. He has been ambulating with a cane for about a year. Patient lives with daughter, and she reports that he is sedentary and lack physical activity. He will benefit from skilled PT to address his strength deficits to improve functional independence and decrease his risk for falls. Patient is hard of hearing and has some delayed processing.   OBJECTIVE IMPAIRMENTS: Abnormal gait, decreased balance, decreased strength, and decreased safety awareness.   ACTIVITY LIMITATIONS: sitting, standing, squatting, stairs, and locomotion level  PERSONAL FACTORS: Age and Fitness are also affecting patient's functional outcome.   REHAB POTENTIAL: Good  CLINICAL DECISION MAKING: Stable/uncomplicated  EVALUATION COMPLEXITY: Low  GOALS: Goals reviewed with patient? Yes  SHORT TERM GOALS: Target date: 12/20/23  Patient will be independent with initial HEP. Baseline:  Goal status: INITIAL  2.  Patient will demonstrate decreased fall risk by scoring < 20 sec on TUG. Baseline: 24s Goal status: INITIAL  3.  Patient will be educated on strategies to decrease risk of falls.  Baseline:  Goal status: INITIAL   LONG TERM GOALS: Target date: 01/24/24  Patient will be independent with advanced/ongoing HEP to improve outcomes and carryover.  Baseline:  Goal status: INITIAL  2.  Patient will complete 5xSTS <20s without UE use Baseline: 27s with push off  Goal status: INITIAL  3.  Patient will demonstrate improved functional LE strength as demonstrated by 5/5 in BLE. Baseline:  Goal status: INITIAL  4.  Patient will demonstrate decreased fall risk by scoring < 15 sec on TUG. Baseline: 24s Goal status: INITIAL    PLAN:  PT FREQUENCY: 2x/week  PT DURATION:  10 weeks  PLANNED INTERVENTIONS: 97110-Therapeutic exercises, 97530- Therapeutic activity, [id number]- Neuromuscular re-education, 97535- Self Care, 02859- Manual therapy, [phone]- Gait  training, Patient/Family education, Balance training, Stair training, Cryotherapy, and Moist heat  PLAN FOR NEXT SESSION: LE and functional strengthening    Johathon Overturf L Nazia Rhines, PT, DPT, OCS 11/28/2023, 3:52 PM

## 2023-11-29 ENCOUNTER — Telehealth: Payer: Self-pay | Admitting: Cardiology

## 2023-11-29 ENCOUNTER — Ambulatory Visit (HOSPITAL_COMMUNITY)
Admission: RE | Admit: 2023-11-29 | Discharge: 2023-11-29 | Disposition: A | Discharge status: Home / Self Care | Source: Ambulatory Visit | Attending: Vascular Surgery | Admitting: Vascular Surgery

## 2023-11-29 ENCOUNTER — Other Ambulatory Visit: Payer: Self-pay

## 2023-11-29 DIAGNOSIS — N400 Enlarged prostate without lower urinary tract symptoms: Secondary | ICD-10-CM | POA: Diagnosis not present

## 2023-11-29 DIAGNOSIS — I7143 Infrarenal abdominal aortic aneurysm, without rupture: Secondary | ICD-10-CM

## 2023-11-29 DIAGNOSIS — I723 Aneurysm of iliac artery: Secondary | ICD-10-CM | POA: Insufficient documentation

## 2023-11-29 DIAGNOSIS — K5730 Diverticulosis of large intestine without perforation or abscess without bleeding: Secondary | ICD-10-CM | POA: Insufficient documentation

## 2023-11-29 MED ORDER — IOHEXOL 350 MG/ML SOLN
100.0000 mL | Freq: Once | INTRAVENOUS | Status: AC | PRN
Start: 2023-11-29 — End: 2023-11-29
  Administered 2023-11-29: 100 mL via INTRAVENOUS

## 2023-11-29 NOTE — Telephone Encounter (Signed)
 Caller Margarett) reporting patient abdominal aneurysm has grown from 3.9 to 7.7 since February and referenced patient's CT ABDOMEN PELVIS WO CONTRAST test results.

## 2023-11-29 NOTE — Telephone Encounter (Signed)
 Notified Dr. Shlomo of 7.7 cm infrarenal aneurysm found on imaging ordered by GI. Spoke to [employer] and Bleu Minerd McGonigal in [facility] regarding appt today. Referral order placed.

## 2023-11-29 NOTE — Telephone Encounter (Signed)
 Left message for Juan Goodwin to call back.

## 2023-11-29 NOTE — Progress Notes (Signed)
 Order for CTA abd/chest/pelvis ordered per Dr. Lanis office. Last Cr 1.43 on 11/06/23.

## 2023-11-29 NOTE — Telephone Encounter (Signed)
 Call to patient, spoke with Dtr Juan Goodwin. Advised of 7.7 cm infrarenal aortic aneurysm. Asked if patient had any symptoms such as abdominal pain, back pain or fatigue. Juan Goodwin endorses fatigue but says her father has been doing PT several times this week and it was worn him out. She agrees to imaging/consult with Dr. Lanis as soon as possible, which may be today or tomorrow. She verbalizes understanding that patient needs to stop eating 1 hour before any imaging. Juan Goodwin verbalizes understanding that if symptoms escalate in any way to present to ED or call 911.   Also spoke with Juan Goodwin at [facility] and updated her on plan and that patient/dtr have been notified.

## 2023-12-03 ENCOUNTER — Other Ambulatory Visit: Payer: Self-pay

## 2023-12-03 ENCOUNTER — Ambulatory Visit

## 2023-12-03 DIAGNOSIS — M6281 Muscle weakness (generalized): Secondary | ICD-10-CM | POA: Diagnosis not present

## 2023-12-03 DIAGNOSIS — R2689 Other abnormalities of gait and mobility: Secondary | ICD-10-CM

## 2023-12-03 NOTE — Therapy (Signed)
 OUTPATIENT PHYSICAL THERAPY LOWER EXTREMITY TREATMENT   Patient Name: AUGUSTO DECKMAN MRN: [id number] DOB:23-Dec-1935, 88 y.o., male Today's Date: 12/03/2023  END OF SESSION:  PT End of Session - 12/03/23 1556     Visit Number 4    Date for Recertification  01/24/24    Authorization Type [employer]    Progress Note Due on Visit 10    PT Start Time 1513    PT Stop Time 1554    PT Time Calculation (min) 41 min             Past Medical History:  Diagnosis Date   Acute pyloric channel ulcer 07/13/2014   Acute upper GI bleed 07/13/2014   Aortic atherosclerosis    Ascending aorta dilatation    4.3cm by echo 09/2022 and 3.9cm by Chest CT 03/2023.   Blood transfusion without reported diagnosis    CAD (coronary artery disease)    Cerebrovascular disease 10/08/2018   Chronic kidney disease, stage 3a (HCC) 03/30/2017   Dilated aortic root    38 mm by echo with 04/2021   Gastrointestinal hemorrhage with melena 06/2014   Global aphasia 03/27/2017   Helicobacter pylori gastritis 07/13/2014   High cholesterol    Hypernatremia 07/13/2014   Hypertension    Hypokalemia 07/13/2014   Normocytic normochromic anemia 04/10/2017   REM sleep behavior disorder 10/08/2018   Subdural hematoma (HCC) 03/26/2017   Thrombocytopenia 07/13/2014   Past Surgical History:  Procedure Laterality Date   BRAIN SURGERY     CRANIOTOMY Left 03/26/2017   Procedure: CRANIOTOMY HEMATOMA EVACUATION SUBDURAL;  Surgeon: Onetha Kuba, MD;  Location: [facility] OR;  Service: Neurosurgery;  Laterality: Left;   ESOPHAGOGASTRODUODENOSCOPY N/A 07/11/2014   Procedure: ESOPHAGOGASTRODUODENOSCOPY (EGD);  Surgeon: Jerrell Sol, MD;  Location: [facility];  Service: Endoscopy;  Laterality: N/A;   LEFT HEART CATH AND CORONARY ANGIOGRAPHY N/A 04/26/2020   Procedure: LEFT HEART CATH AND CORONARY ANGIOGRAPHY;  Surgeon: Dann Candyce RAMAN, MD;  Location: [facility] INVASIVE CV LAB;  Service: Cardiovascular;  Laterality: N/A;   Patient  Active Problem List   Diagnosis Date Noted   Deficiency anemia 11/06/2023   Sarcopenia 11/06/2023   Chronic constipation 11/06/2023   Aortic atherosclerosis    Ascending aorta dilatation    CAD (coronary artery disease) 10/12/2020   Angina at rest 10/11/2020   Elevated troponin    Chest pain 04/25/2020   NSTEMI (non-ST elevated myocardial infarction) (HCC) 04/25/2020   Hyperlipidemia 08/08/2019   Cerebrovascular disease 10/08/2018   REM sleep behavior disorder 10/08/2018   Weakness 04/10/2017   Dehydration 04/10/2017   Acute kidney injury superimposed on chronic kidney disease (HCC) 04/10/2017   Normocytic normochromic anemia 04/10/2017   Chronic kidney disease (CKD), stage III (moderate) (HCC) 03/30/2017   HTN (hypertension), malignant 03/29/2017   Orthostatic hypotension 03/27/2017   Global aphasia 03/27/2017   Subdural hematoma (HCC) 03/26/2017   Acute pyloric channel ulcer 07/13/2014   Hypokalemia 07/13/2014   Acute upper GI bleed 07/13/2014   ARF (acute renal failure) 07/13/2014   Hypernatremia 07/13/2014   Hypotension 07/13/2014   Thrombocytopenia 07/13/2014   Helicobacter pylori gastritis 07/13/2014   Gastrointestinal hemorrhage with melena    Melena 07/11/2014   GI bleed 07/11/2014   Acute blood loss anemia 07/10/2014    PCP: Carlin Gull  REFERRING PROVIDER: Carlin Gull  REFERRING DIAG: R53.1 (ICD-10-CM) - Weakness   THERAPY DIAG:  Muscle weakness (generalized)  Other abnormalities of gait and mobility  Rationale for Evaluation and Treatment: Rehabilitation  ONSET DATE: 10/19/23 referral date  SUBJECTIVE:   SUBJECTIVE STATEMENT:12/03/23: no new complaints, reports felt ok after last weeks sessions Daughter reports his doctor sent him here because he does not have a lot of physical activity a lot.   PERTINENT HISTORY: See above   PAIN:  Are you having pain? No  PRECAUTIONS: Fall  RED FLAGS: None   WEIGHT BEARING RESTRICTIONS: No  FALLS:   Has patient fallen in last 6 months? No  LIVING ENVIRONMENT: Lives with: lives with their daughter Lives in: House/apartment Stairs: No Has following equipment at home: Single point cane  OCCUPATION: Retired  PLOF: Independent with basic ADLs and Independent with gait  PATIENT GOALS: find out why they sent me here and do whatever I need to   NEXT MD VISIT:   OBJECTIVE:  Note: Objective measures were completed at Evaluation unless otherwise noted.  DIAGNOSTIC FINDINGS: N/A  COGNITION: Overall cognitive status: Within functional limits for tasks assessed and slower processing, daughter is here to fill in subjective pieces      SENSATION: WFL   POSTURE: rounded shoulders, forward head, and flexed trunk   LOWER EXTREMITY ROM: grossly WFL   LOWER EXTREMITY MMT:  MMT Right eval Left eval  Hip flexion 3 3-  Hip extension    Hip abduction 4 4  Hip adduction    Hip internal rotation    Hip external rotation    Knee flexion 4 4  Knee extension 4 3+  Ankle dorsiflexion    Ankle plantarflexion    Ankle inversion    Ankle eversion     (Blank rows = not tested)   FUNCTIONAL TESTS:  5 times sit to stand: 27s with push off  Timed up and go (TUG): 24s  GAIT: Distance walked: in clinic distances Assistive device utilized: Single point cane Level of assistance: Modified independence Comments: slowed speed, decreased foot clearance, flexed trunk                                                                                                                                TREATMENT DATE:  12/03/23:  Nustep Ue's and LE's 6 min level 5  Seated rows 15 # 20 reps Seated lat pulls 20# 15 reps B knee flexion 25#  B knee ext 5# 1 set 20, therapist assisted with terminal extension Gait x 400' with st cane, 4 laps Leg press 20# 15 x B Les and staggered position, 15 x each  Standing therabar 2# forward shoulder presses 10 x 2  11/28/23:  Nustep L 5 x 6 min Ue's and  LE's Standing B heel raises 10 x 2  Seated rows 20# 15x Seated B knee ext 5# 10x  Seated B knee flexion 25# 2 x 10 Seated yellow theraband B shoulder ER 15 x Gait x 300', 3 laps with st cane  Leg press B  20 # 2 x 10 Standing forward punches with 2# therabar  10 x Standing alt high marches 12 x  11/26/23:  Nustep level 5 x 6 min Ue's and LE's B knee ext 5# 1 x 10 B knee flexion 25# 1 x 10  Standing high marches  Standing B heel raises B hand support Sit to stand from elevated mat, 10 x hands on knees  Seated rows, blue band, 15 reps Seated B shoulder ER yellow band 15 reps Standing forward punches with 2# therabar 10 reps   Gait with st cane 2 laps, 200'  Leg press 20# 10 x 1   11/15/23- EVAL, HEP    PATIENT EDUCATION:  Education details: HEP and POC Person educated: Patient and Child(ren) Education method: Medical illustrator Education comprehension: verbalized understanding, returned demonstration, verbal cues required, and tactile cues required  HOME EXERCISE PROGRAM: Access Code: [email or web address] URL: [email or web address] Date: 11/15/2023 Prepared by: Almetta Fam  Exercises - Sit to Stand with Armchair  - 1 x daily - 7 x weekly - 2 sets - 10 reps - Standing March with Counter Support  - 1 x daily - 7 x weekly - 2 sets - 10 reps - Standing Hip Abduction with Counter Support  - 1 x daily - 7 x weekly - 2 sets - 10 reps - Heel Raises with Counter Support  - 1 x daily - 7 x weekly - 2 sets - 10 reps - Seated Hip Abduction with Resistance  - 1 x daily - 7 x weekly - 2 sets - 10 reps - Seated March with Resistance  - 1 x daily - 7 x weekly - 2 sets - 10 reps  ASSESSMENT:  CLINICAL IMPRESSION: 11/28/23:  Today was the third treatment session following PT initial evaluation.  He was directed through a series of bulk strengthening as well as endurance training. Added more bulk LE strengthening today he was quite shaky after getting out of the leg press.  He  doubled his distance today with walk in mid session.  Should benefit from ongoing PT .  Patient is a 88 y.o. male who was seen today for physical therapy evaluation and treatment for weakness. He has some weakness in his lower extremities, especially with hip flexion. The LLE is weaker than the R. He has been ambulating with a cane for about a year. Patient lives with daughter, and she reports that he is sedentary and lack physical activity. He will benefit from skilled PT to address his strength deficits to improve functional independence and decrease his risk for falls. Patient is hard of hearing and has some delayed processing.   OBJECTIVE IMPAIRMENTS: Abnormal gait, decreased balance, decreased strength, and decreased safety awareness.   ACTIVITY LIMITATIONS: sitting, standing, squatting, stairs, and locomotion level  PERSONAL FACTORS: Age and Fitness are also affecting patient's functional outcome.   REHAB POTENTIAL: Good  CLINICAL DECISION MAKING: Stable/uncomplicated  EVALUATION COMPLEXITY: Low  GOALS: Goals reviewed with patient? Yes  SHORT TERM GOALS: Target date: 12/20/23  Patient will be independent with initial HEP. Baseline:  Goal status: 12/03/23: progressing  2.  Patient will demonstrate decreased fall risk by scoring < 20 sec on TUG. Baseline: 24s Goal status: INITIAL  3.  Patient will be educated on strategies to decrease risk of falls.  Baseline:  Goal status: INITIAL   LONG TERM GOALS: Target date: 01/24/24  Patient will be independent with advanced/ongoing HEP to improve outcomes and carryover.  Baseline:  Goal status: INITIAL  2.  Patient will complete 5xSTS <20s without UE use Baseline:  27s with push off  Goal status: INITIAL  3.  Patient will demonstrate improved functional LE strength as demonstrated by 5/5 in BLE. Baseline:  Goal status: INITIAL  4.  Patient will demonstrate decreased fall risk by scoring < 15 sec on TUG. Baseline: 24s Goal  status: INITIAL    PLAN:  PT FREQUENCY: 2x/week  PT DURATION: 10 weeks  PLANNED INTERVENTIONS: 97110-Therapeutic exercises, 97530- Therapeutic activity, [id number]- Neuromuscular re-education, 97535- Self Care, [id number]- Manual therapy, [phone]- Gait training, Patient/Family education, Balance training, Stair training, Cryotherapy, and Moist heat  PLAN FOR NEXT SESSION: LE and functional strengthening    Jadalee Westcott L Chante Mayson, PT, DPT, OCS 12/03/2023, 3:56 PM

## 2023-12-04 NOTE — Therapy (Signed)
 OUTPATIENT PHYSICAL THERAPY LOWER EXTREMITY TREATMENT   Patient Name: Juan Goodwin MRN: [id number] DOB:03-12-1936, 88 y.o., male Today's Date: 12/05/2023  END OF SESSION:  PT End of Session - 12/05/23 1528     Visit Number 5    Date for Recertification  01/24/24    Authorization Type A[employer]na Medicare    Progress Note Due on Visit 10    PT Start Time 1528    PT Stop Time 1615    PT Time Calculation (min) 47 min    Activity Tolerance Patient tolerated treatment well    Behavior During Therapy [facility] for tasks assessed/performed              Past Medical History:  Diagnosis Date   Acute pyloric channel ulcer 07/13/2014   Acute upper GI bleed 07/13/2014   Aortic atherosclerosis    Ascending aorta dilatation    4.3cm by echo 09/2022 and 3.9cm by Chest CT 03/2023.   Blood transfusion without reported diagnosis    CAD (coronary artery disease)    Cerebrovascular disease 10/08/2018   Chronic kidney disease, stage 3a (HCC) 03/30/2017   Dilated aortic root    38 mm by echo with 04/2021   Gastrointestinal hemorrhage with melena 06/2014   Global aphasia 03/27/2017   Helicobacter pylori gastritis 07/13/2014   High cholesterol    Hypernatremia 07/13/2014   Hypertension    Hypokalemia 07/13/2014   Normocytic normochromic anemia 04/10/2017   REM sleep behavior disorder 10/08/2018   Subdural hematoma (HCC) 03/26/2017   Thrombocytopenia 07/13/2014   Past Surgical History:  Procedure Laterality Date   BRAIN SURGERY     CRANIOTOMY Left 03/26/2017   Procedure: CRANIOTOMY HEMATOMA EVACUATION SUBDURAL;  Surgeon: Onetha Kuba, MD;  Location: [facility] OR;  Service: Neurosurgery;  Laterality: Left;   ESOPHAGOGASTRODUODENOSCOPY N/A 07/11/2014   Procedure: ESOPHAGOGASTRODUODENOSCOPY (EGD);  Surgeon: Jerrell Sol, MD;  Location: [facility];  Service: Endoscopy;  Laterality: N/A;   LEFT HEART CATH AND CORONARY ANGIOGRAPHY N/A 04/26/2020   Procedure: LEFT HEART CATH AND CORONARY ANGIOGRAPHY;   Surgeon: Dann Candyce RAMAN, MD;  Location: [facility] INVASIVE CV LAB;  Service: Cardiovascular;  Laterality: N/A;   Patient Active Problem List   Diagnosis Date Noted   Deficiency anemia 11/06/2023   Sarcopenia 11/06/2023   Chronic constipation 11/06/2023   Aortic atherosclerosis    Ascending aorta dilatation    CAD (coronary artery disease) 10/12/2020   Angina at rest 10/11/2020   Elevated troponin    Chest pain 04/25/2020   NSTEMI (non-ST elevated myocardial infarction) (HCC) 04/25/2020   Hyperlipidemia 08/08/2019   Cerebrovascular disease 10/08/2018   REM sleep behavior disorder 10/08/2018   Weakness 04/10/2017   Dehydration 04/10/2017   Acute kidney injury superimposed on chronic kidney disease 04/10/2017   Normocytic normochromic anemia 04/10/2017   Chronic kidney disease (CKD), stage III (moderate) (HCC) 03/30/2017   HTN (hypertension), malignant 03/29/2017   Orthostatic hypotension 03/27/2017   Global aphasia 03/27/2017   Subdural hematoma (HCC) 03/26/2017   Acute pyloric channel ulcer 07/13/2014   Hypokalemia 07/13/2014   Acute upper GI bleed 07/13/2014   ARF (acute renal failure) 07/13/2014   Hypernatremia 07/13/2014   Hypotension 07/13/2014   Thrombocytopenia 07/13/2014   Helicobacter pylori gastritis 07/13/2014   Gastrointestinal hemorrhage with melena    Melena 07/11/2014   GI bleed 07/11/2014   Acute blood loss anemia 07/10/2014    PCP: Carlin Gull  REFERRING PROVIDER: Carlin Gull  REFERRING DIAG: R53.1 (ICD-10-CM) - Weakness   THERAPY DIAG:  Muscle weakness (generalized)  Other abnormalities of gait and mobility  Rationale for Evaluation and Treatment: Rehabilitation  ONSET DATE: 10/19/23 referral date  SUBJECTIVE:   SUBJECTIVE STATEMENT:I am doing alright.   Daughter reports his doctor sent him here because he does not have a lot of physical activity a lot.   PERTINENT HISTORY: See above   PAIN:  Are you having pain? No  PRECAUTIONS:  Fall  RED FLAGS: None   WEIGHT BEARING RESTRICTIONS: No  FALLS:  Has patient fallen in last 6 months? No  LIVING ENVIRONMENT: Lives with: lives with their daughter Lives in: House/apartment Stairs: No Has following equipment at home: Single point cane  OCCUPATION: Retired  PLOF: Independent with basic ADLs and Independent with gait  PATIENT GOALS: find out why they sent me here and do whatever I need to   NEXT MD VISIT:   OBJECTIVE:  Note: Objective measures were completed at Evaluation unless otherwise noted.  DIAGNOSTIC FINDINGS: N/A  COGNITION: Overall cognitive status: Within functional limits for tasks assessed and slower processing, daughter is here to fill in subjective pieces      SENSATION: WFL   POSTURE: rounded shoulders, forward head, and flexed trunk   LOWER EXTREMITY ROM: grossly WFL   LOWER EXTREMITY MMT:  MMT Right eval Left eval  Hip flexion 3 3-  Hip extension    Hip abduction 4 4  Hip adduction    Hip internal rotation    Hip external rotation    Knee flexion 4 4  Knee extension 4 3+  Ankle dorsiflexion    Ankle plantarflexion    Ankle inversion    Ankle eversion     (Blank rows = not tested)   FUNCTIONAL TESTS:  5 times sit to stand: 27s with push off  Timed up and go (TUG): 24s  GAIT: Distance walked: in clinic distances Assistive device utilized: Single point cane Level of assistance: Modified independence Comments: slowed speed, decreased foot clearance, flexed trunk                                                                                                                                TREATMENT DATE:  12/05/23 NuStep L5x28mins  TUG 19s Ball squeeze 2x10 STS 2x10 from elevated mat  HS curls 25# 2x10 Leg ext 5# 2x10 Seated rows green band 2x10 OHP with yellow ball 2x10 Chest press with yellow ball 2x10 Seated rotations with yellow ball 2x10  12/03/23:  Nustep Ue's and LE's 6 min level 5  Seated rows 15 #  20 reps Seated lat pulls 20# 15 reps B knee flexion 25#  B knee ext 5# 1 set 20, therapist assisted with terminal extension Gait x 400' with st cane, 4 laps Leg press 20# 15 x B Les and staggered position, 15 x each  Standing therabar 2# forward shoulder presses 10 x 2  11/28/23:  Nustep L 5 x 6 min Ue's and LE's  Standing B heel raises 10 x 2  Seated rows 20# 15x Seated B knee ext 5# 10x  Seated B knee flexion 25# 2 x 10 Seated yellow theraband B shoulder ER 15 x Gait x 300', 3 laps with st cane  Leg press B  20 # 2 x 10 Standing forward punches with 2# therabar 10 x Standing alt high marches 12 x  11/26/23:  Nustep level 5 x 6 min Ue's and LE's B knee ext 5# 1 x 10 B knee flexion 25# 1 x 10  Standing high marches  Standing B heel raises B hand support Sit to stand from elevated mat, 10 x hands on knees  Seated rows, blue band, 15 reps Seated B shoulder ER yellow band 15 reps Standing forward punches with 2# therabar 10 reps   Gait with st cane 2 laps, 200'  Leg press 20# 10 x 1   11/15/23- EVAL, HEP    PATIENT EDUCATION:  Education details: HEP and POC Person educated: Patient and Child(ren) Education method: Medical illustrator Education comprehension: verbalized understanding, returned demonstration, verbal cues required, and tactile cues required  HOME EXERCISE PROGRAM: Access Code: [email or web address] URL: [email or web address] Date: 11/15/2023 Prepared by: Almetta Fam  Exercises - Sit to Stand with Armchair  - 1 x daily - 7 x weekly - 2 sets - 10 reps - Standing March with Counter Support  - 1 x daily - 7 x weekly - 2 sets - 10 reps - Standing Hip Abduction with Counter Support  - 1 x daily - 7 x weekly - 2 sets - 10 reps - Heel Raises with Counter Support  - 1 x daily - 7 x weekly - 2 sets - 10 reps - Seated Hip Abduction with Resistance  - 1 x daily - 7 x weekly - 2 sets - 10 reps - Seated March with Resistance  - 1 x daily - 7 x weekly - 2 sets  - 10 reps  ASSESSMENT:  CLINICAL IMPRESSION:   continued to focus session on bulk strengthening as well as endurance training.  Worked on both some LE and UE interventions. STS were really hard for him, even from elevated mat he needs some assistance to prevent backwards LOB. Pt reports doing some exercises at home. Should benefit from ongoing PT .  Patient is a 88 y.o. male who was seen today for physical therapy evaluation and treatment for weakness. He has some weakness in his lower extremities, especially with hip flexion. The LLE is weaker than the R. He has been ambulating with a cane for about a year. Patient lives with daughter, and she reports that he is sedentary and lack physical activity. He will benefit from skilled PT to address his strength deficits to improve functional independence and decrease his risk for falls. Patient is hard of hearing and has some delayed processing.   OBJECTIVE IMPAIRMENTS: Abnormal gait, decreased balance, decreased strength, and decreased safety awareness.   ACTIVITY LIMITATIONS: sitting, standing, squatting, stairs, and locomotion level  PERSONAL FACTORS: Age and Fitness are also affecting patient's functional outcome.   REHAB POTENTIAL: Good  CLINICAL DECISION MAKING: Stable/uncomplicated  EVALUATION COMPLEXITY: Low  GOALS: Goals reviewed with patient? Yes  SHORT TERM GOALS: Target date: 12/20/23  Patient will be independent with initial HEP. Baseline:  Goal status: 12/03/23: progressing  2.  Patient will demonstrate decreased fall risk by scoring < 20 sec on TUG. Baseline: 24s Goal status: MET 19s 12/05/23  3.  Patient will be  educated on strategies to decrease risk of falls.  Baseline:  Goal status: IN PROGRESS    LONG TERM GOALS: Target date: 01/24/24  Patient will be independent with advanced/ongoing HEP to improve outcomes and carryover.  Baseline:  Goal status: INITIAL  2.  Patient will complete 5xSTS <20s without UE  use Baseline: 27s with push off  Goal status: INITIAL  3.  Patient will demonstrate improved functional LE strength as demonstrated by 5/5 in BLE. Baseline:  Goal status: INITIAL  4.  Patient will demonstrate decreased fall risk by scoring < 15 sec on TUG. Baseline: 24s Goal status: INITIAL    PLAN:  PT FREQUENCY: 2x/week  PT DURATION: 10 weeks  PLANNED INTERVENTIONS: 97110-Therapeutic exercises, 97530- Therapeutic activity, [id number]- Neuromuscular re-education, 97535- Self Care, 02859- Manual therapy, [phone]- Gait training, Patient/Family education, Balance training, Stair training, Cryotherapy, and Moist heat  PLAN FOR NEXT SESSION: LE and functional strengthening    Almetta Fam, PT, DPT 12/05/2023, 4:12 PM

## 2023-12-05 ENCOUNTER — Ambulatory Visit

## 2023-12-05 DIAGNOSIS — R2689 Other abnormalities of gait and mobility: Secondary | ICD-10-CM | POA: Diagnosis not present

## 2023-12-05 DIAGNOSIS — M6281 Muscle weakness (generalized): Secondary | ICD-10-CM

## 2023-12-06 ENCOUNTER — Encounter: Payer: Self-pay | Admitting: Vascular Surgery

## 2023-12-06 ENCOUNTER — Ambulatory Visit: Attending: Vascular Surgery | Admitting: Vascular Surgery

## 2023-12-06 ENCOUNTER — Telehealth (HOSPITAL_BASED_OUTPATIENT_CLINIC_OR_DEPARTMENT_OTHER): Payer: Self-pay | Admitting: *Deleted

## 2023-12-06 VITALS — BP 171/112 | HR 74 | Temp 98.0°F | Resp 20 | Ht 74.0 in | Wt 151.3 lb

## 2023-12-06 DIAGNOSIS — I723 Aneurysm of iliac artery: Secondary | ICD-10-CM

## 2023-12-06 DIAGNOSIS — I7143 Infrarenal abdominal aortic aneurysm, without rupture: Secondary | ICD-10-CM | POA: Diagnosis not present

## 2023-12-06 NOTE — Telephone Encounter (Signed)
    Primary Cardiologist:Mark Jeffrie, MD  Chart reviewed as part of pre-operative protocol coverage. Because of Juan Goodwin's past medical history and time since last visit, he/she will require a follow-up visit in order to better assess preoperative cardiovascular risk.  Pre-op covering staff: - Please schedule office appointment and call patient to inform them. - Please contact requesting surgeon's office via preferred method (i.e, phone, fax) to inform them of need for appointment prior to surgery.  If applicable, this message will also be routed to pharmacy pool and/or primary cardiologist for input on holding anticoagulant/antiplatelet agent as requested below so that this information is available at time of patient's appointment.   Josefa CHRISTELLA Beauvais, NP  12/06/2023, 1:12 PM

## 2023-12-06 NOTE — Telephone Encounter (Signed)
   Pre-operative Risk Assessment    Patient Name: Juan Goodwin  DOB: 1935-11-30 MRN: [id number]   Date of last office visit: 09/01/22 DR. TURNER Date of next office visit: NONE   Request for Surgical Clearance    Procedure:  EVAR  Date of Surgery:  Clearance TBD (ASAP)                               Surgeon:  DR. LANIS Surgeon's Group or Practice Name:  [facility] Phone number:  [phone] Fax number:  [phone]   Type of Clearance Requested:   - Medical ; CARDIAC CLEARANCE ONLY BEING REQUESTED    Type of Anesthesia:  General    Additional requests/questions:    Bonney Niels Jest   12/06/2023, 12:52 PM

## 2023-12-06 NOTE — Telephone Encounter (Signed)
 Appointment scheduled for 12/11/2023 @ 8am.

## 2023-12-06 NOTE — Progress Notes (Signed)
 Office Note     CC: 7.5 cm AAA Requesting Provider:  [name], MD  HPI: Juan Goodwin is a 88 y.o. (1935/12/23) male presenting at the request of .[name], MD 7.5 cm AAA.  On exam, Javid was doing well, accompanied by his daughter.  A native of [place] , he moved to [place] years ago.  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  Kage was unaware of his abdominal aortic aneurysm until it was incidentally found on recent CT which was obtained due to concern for malignancy after significant weight loss.  He was booked for an urgent visit with me with subsequent CTA to further define the aneurysm. Signe denies abdominal pain, chest pain, back pain.  He has had 50 pound weight loss over the last year, and has anemia which hematology oncology believes is multifactorial.  There appears to be a component of failure to thrive however he remains fairly independent, at ambulates using a cane, and works with physical therapy on a regular basis. Denies claudication, ischemic rest pain, tissue loss.  The pt is  on a statin for cholesterol management.  The pt is  on a daily aspirin .     Past Medical History:  Diagnosis Date   AAA (abdominal aortic aneurysm)    Acute pyloric channel ulcer 07/13/2014   Acute upper GI bleed 07/13/2014   Aortic atherosclerosis    Ascending aorta dilatation    4.3cm by echo 09/2022 and 3.9cm by Chest CT 03/2023.   Blood transfusion without reported diagnosis    CAD (coronary artery disease)    Cerebrovascular disease 10/08/2018   Chronic kidney disease, stage 3a (HCC) 03/30/2017   Dilated aortic root    38 mm by echo with 04/2021   Gastrointestinal hemorrhage with melena 06/2014   Global aphasia 03/27/2017   Helicobacter pylori gastritis 07/13/2014   High cholesterol    Hypernatremia 07/13/2014   Hypertension    Hypokalemia 07/13/2014   Normocytic normochromic anemia  04/10/2017   REM sleep behavior disorder 10/08/2018   Subdural hematoma (HCC) 03/26/2017   Thrombocytopenia 07/13/2014    Past Surgical History:  Procedure Laterality Date   BRAIN SURGERY     CRANIOTOMY Left 03/26/2017   Procedure: CRANIOTOMY HEMATOMA EVACUATION SUBDURAL;  Surgeon: Onetha Kuba, MD;  Location: [facility] OR;  Service: Neurosurgery;  Laterality: Left;   ESOPHAGOGASTRODUODENOSCOPY N/A 07/11/2014   Procedure: ESOPHAGOGASTRODUODENOSCOPY (EGD);  Surgeon: Jerrell Sol, MD;  Location: [facility];  Service: Endoscopy;  Laterality: N/A;   LEFT HEART CATH AND CORONARY ANGIOGRAPHY N/A 04/26/2020   Procedure: LEFT HEART CATH AND CORONARY ANGIOGRAPHY;  Surgeon: Dann Candyce RAMAN, MD;  Location: [facility];  Service: Cardiovascular;  Laterality: N/A;    Social History   Socioeconomic History   Marital status: Widowed    Spouse name: Not on file   Number of children: 3   Years of education: 14   Highest education level: Not on file  Occupational History   Not on file  Tobacco Use   Smoking status: Former    Current packs/day: 1.00    Average packs/day: 1 pack/day for 35.0 years (35.0 ttl pk-yrs)    Types: Cigarettes   Smokeless tobacco: Never  Vaping Use   Vaping status: Never Used  Substance and Sexual Activity   Alcohol use: No   Drug use: Not Currently   Sexual activity: Never  Other Topics Concern  Not on file  Social History Narrative   Right handed    Occasional cup of coffee    Lives at home alone   Wife is deceased along with 2 children 1 child is living     Social Drivers of Corporate investment banker Strain: Not on file  Food Insecurity: No Food Insecurity (11/08/2021)   Hunger Vital Sign    Worried About Running Out of Food in the Last Year: Never true    Ran Out of Food in the Last Year: Never true  Transportation Needs: No Transportation Needs (11/08/2021)   PRAPARE - Administrator, Civil Service (Medical): No    Lack of  Transportation (Non-Medical): No  Physical Activity: Inactive (11/08/2021)   Exercise Vital Sign    Days of Exercise per Week: 0 days    Minutes of Exercise per Session: 0 min  Stress: No Stress Concern Present (11/08/2021)   [employer] of Occupational Health - Occupational Stress Questionnaire    Feeling of Stress : Only a little  Social Connections: Not on file  Intimate Partner Violence: Not on file   Family History  Problem Relation Age of Onset   Heart failure Mother     Current Outpatient Medications  Medication Sig Dispense Refill   aspirin  EC (ASPIRIN  LOW DOSE) 81 MG tablet Take 1 tablet (81 mg total) by mouth daily. 90 tablet 3   isosorbide  mononitrate (IMDUR ) 30 MG 24 hr tablet TAKE 1 TABLET(30 MG) BY MOUTH DAILY 90 tablet 0   Multiple Vitamins-Minerals (MULTIVITAMIN ADULTS 50+ PO) Take 1 tablet by mouth daily.     nitroGLYCERIN  (NITROSTAT ) 0.4 MG SL tablet Place 1 tablet (0.4 mg total) under the tongue every 5 (five) minutes as needed for chest pain. 25 tablet 11   rosuvastatin  (CRESTOR ) 40 MG tablet Take 1 tablet (40 mg total) by mouth daily. 90 tablet 3   No current facility-administered medications for this visit.    Allergies  Allergen Reactions   Doxazosin Mesylate Other (See Comments)     REVIEW OF SYSTEMS:  [X]  denotes positive finding, [ ]  denotes negative finding Cardiac  Comments:  Chest pain or chest pressure:    Shortness of breath upon exertion:    Short of breath when lying flat:    Irregular heart rhythm:        Vascular    Pain in calf, thigh, or hip brought on by ambulation:    Pain in feet at night that wakes you up from your sleep:     Blood clot in your veins:    Leg swelling:         Pulmonary    Oxygen at home:    Productive cough:     Wheezing:         Neurologic    Sudden weakness in arms or legs:     Sudden numbness in arms or legs:     Sudden onset of difficulty speaking or slurred speech:    Temporary loss of vision in  one eye:     Problems with dizziness:         Gastrointestinal    Blood in stool:     Vomited blood:         Genitourinary    Burning when urinating:     Blood in urine:        Psychiatric    Major depression:         Hematologic  Bleeding problems:    Problems with blood clotting too easily:        Skin    Rashes or ulcers:        Constitutional    Fever or chills:      PHYSICAL EXAMINATION:  Vitals:   12/06/23 1139  BP: (!) 171/112  Pulse: 74  Resp: 20  Temp: 98 F (36.7 C)  TempSrc: Temporal  SpO2: 95%  Weight: 151 lb 4.8 oz (68.6 kg)  Height: 6' 2 (1.88 m)    General:  WDWN in NAD; vital signs documented above Gait: Not observed HENT: WNL, normocephalic Pulmonary: normal non-labored breathing , without wheezing Cardiac: regular HR Abdomen: soft, NT, no masses Skin: without rashes Vascular Exam/Pulses:  Right Left  Radial 2+ (normal) 2+ (normal)  Ulnar    Femoral    Popliteal 2+ (normal) 2+ (normal)  DP 2+ (normal) 2+ (normal)  PT     Extremities: without ischemic changes, without Gangrene , without cellulitis; without open wounds;  Musculoskeletal: no muscle wasting or atrophy  Neurologic: A&O X 3;  No focal weakness or paresthesias are detected Psychiatric:  The pt has Normal affect.   Non-Invasive Vascular Imaging:    See CTA    ASSESSMENT/PLAN: BAYLEN BUCKNER is a 88 y.o. male presenting with asymptomatic 7.5 cm infrarenal abdominal aneurysm with concomitant left internal iliac aneurysm.  On physical exam, he had palpable pulses in the feet.  No masses in the popliteal fossa bilaterally.  Readily palpable aneurysm in the abdomen.  Asymptomatic.  Imaging was reviewed demonstrating 7.5 cm infrarenal abdominal aneurysm with a sufficient neck for endovascular repair.  The left internal iliac aneurysm is problematic, as it is not amenable to iliac branch endoprosthesis.  Warren will need the iliac aneurysm coiled, as well as the feeder  vessels.  My plan is for infrarenal endovascular aortic repair with coil of the left internal iliac artery, its branches, and subsequent coverage.  Both he and his daughter are aware that this comes with increased risk of endoleak.  I think that the odds he has endoleak are higher than average simply due to the size of the aneurysm and large IMA.  Regardless, he is not an open surgical candidate.  I also discussed that with his current medical comorbidities, we could forego surgery, but this comes at a risk of rupture of roughly 30 %/year.  Rupture in the setting would be a mortal event.  After discussing the risks and benefits of the above, [facility] elected to proceed with endovascular repair.  My plan is to send him back to Dr. Shlomo for cardiac preoperative risk assessment.  Once cleared, I plan to prioritize his surgical scheduling.   Fonda FORBES Rim, MD [facility] [phone]

## 2023-12-07 ENCOUNTER — Inpatient Hospital Stay: Admitting: Hematology and Oncology

## 2023-12-07 ENCOUNTER — Inpatient Hospital Stay: Attending: Hematology and Oncology

## 2023-12-07 ENCOUNTER — Encounter: Payer: Self-pay | Admitting: Hematology and Oncology

## 2023-12-07 VITALS — BP 132/95 | HR 86 | Temp 98.0°F | Resp 18 | Ht 74.0 in | Wt 153.0 lb

## 2023-12-07 DIAGNOSIS — K5909 Other constipation: Secondary | ICD-10-CM | POA: Diagnosis not present

## 2023-12-07 DIAGNOSIS — D509 Iron deficiency anemia, unspecified: Secondary | ICD-10-CM | POA: Diagnosis not present

## 2023-12-07 DIAGNOSIS — D539 Nutritional anemia, unspecified: Secondary | ICD-10-CM

## 2023-12-07 LAB — IRON AND IRON BINDING CAPACITY (CC-WL,HP ONLY)
Iron: 42 ug/dL — ABNORMAL LOW (ref 45–182)
Saturation Ratios: 17 % — ABNORMAL LOW (ref 17.9–39.5)
TIBC: 244 ug/dL — ABNORMAL LOW (ref 250–450)
UIBC: 202 ug/dL (ref 117–376)

## 2023-12-07 LAB — CBC WITH DIFFERENTIAL (CANCER CENTER ONLY)
Abs Immature Granulocytes: 0.01 K/uL (ref 0.00–0.07)
Basophils Absolute: 0 K/uL (ref 0.0–0.1)
Basophils Relative: 1 %
Eosinophils Absolute: 0.1 K/uL (ref 0.0–0.5)
Eosinophils Relative: 4 %
HCT: 32.1 % — ABNORMAL LOW (ref 39.0–52.0)
Hemoglobin: 10.5 g/dL — ABNORMAL LOW (ref 13.0–17.0)
Immature Granulocytes: 0 %
Lymphocytes Relative: 36 %
Lymphs Abs: 1.4 K/uL (ref 0.7–4.0)
MCH: 25.6 pg — ABNORMAL LOW (ref 26.0–34.0)
MCHC: 32.7 g/dL (ref 30.0–36.0)
MCV: 78.3 fL — ABNORMAL LOW (ref 80.0–100.0)
Monocytes Absolute: 0.3 K/uL (ref 0.1–1.0)
Monocytes Relative: 9 %
Neutro Abs: 2 K/uL (ref 1.7–7.7)
Neutrophils Relative %: 50 %
Platelet Count: 193 K/uL (ref 150–400)
RBC: 4.1 MIL/uL — ABNORMAL LOW (ref 4.22–5.81)
RDW: 16.8 % — ABNORMAL HIGH (ref 11.5–15.5)
WBC Count: 4 K/uL (ref 4.0–10.5)
nRBC: 0 % (ref 0.0–0.2)

## 2023-12-07 LAB — FERRITIN: Ferritin: 51 ng/mL (ref 24–336)

## 2023-12-07 NOTE — Assessment & Plan Note (Addendum)
 He continues to struggle with constipation I recommend regular laxatives

## 2023-12-07 NOTE — Assessment & Plan Note (Addendum)
 The cause of his anemia is multifactorial, likely a combination of iron deficiency anemia and anemia of chronic kidney disease Iron studies are pending Will call his daughter early next week If he has persistent iron deficiency despite oral iron supplement, I recommend intravenous iron infusion

## 2023-12-07 NOTE — Progress Notes (Signed)
 [facility] OFFICE PROGRESS NOTE  Juan Carlin Redbird, Juan Goodwin  ASSESSMENT & PLAN:  Assessment & Plan Deficiency anemia The cause of his anemia is multifactorial, likely a combination of iron deficiency anemia and anemia of chronic kidney disease Iron studies are pending Will call his daughter early next week If he has persistent iron deficiency despite oral iron supplement, I recommend intravenous iron infusion Chronic constipation He continues to struggle with constipation I recommend regular laxatives    No orders of the defined types were placed in this encounter.   INTERVAL HISTORY: Patient returns for recurrent anemia Symptoms of anemia includes none We reviewed CBC result  SUMMARY OF HEMATOLOGIC HISTORY:  Juan Goodwin 88 y.o. male is here because of anemia He is here accompanied by his daughter due to his cognitive decline He was found to have abnormal CBC from recent blood work I have the opportunity to review his CBC dated back to 2016 However, according to electronic records, he had received intravenous iron infusion in 2013, records not available  On July 10, 2014, he presented with low blood count with a hemoglobin of 5.9 He received blood transfusion and significant investigations including EGD evaluation On July 11, 2014, EGD showed pyloric ulcer with stigmata of bleeding.  It was treated with epinephrine  injection and proton pump inhibitor. Biopsy was taken which show evidence of Helicobacter pylori gastritis with intestinal metaplasia The patient was treated with triple therapy On March 26, 2017, his blood count was normal with hemoglobin of 13.7 He was admitted to the hospital after a fall and CT head show subdural hematoma requiring surgery On June 08, 2018, CBC was normal with hemoglobin of 13 On April 25, 2020, blood count show drop of hemoglobin to 9.1.  The patient was admitted to the hospital due to chest pain and was found to have  chronic kidney disease.  He was hospitalized for non-STEMI ST elevation MI, treated with medical management.  At that point in time, he was noted to have iron deficiency anemia and received 1 dose of intravenous iron infusion With IV Feraheme . On October 11, 2020, repeat CBC showed hemoglobin of 12.6 On November 01, 2021, hemoglobin was 11.6 Most recently, he saw his primary care doctor and had repeat labs on October 11, 2023 which showed Hemoglobin 10.2, MCV 77.8, platelet count 147, A1c of 6% and creatinine of 1.7 He denies recent chest pain on exertion, shortness of breath on minimal exertion, pre-syncopal episodes, or palpitations. He had not noticed any recent bleeding such as epistaxis, hematuria or hematochezia The patient has intermittent use of NSAID. He is on antiplatelets agents. His last colonoscopy was several years ago but he cannot recall the date of his procedure He had no prior history or diagnosis of cancer. His age appropriate screening programs are up-to-date. He denies any pica and eats a variety of diet.  However, he only eats 2 meals a day, typically lunch and dinner and most of his food choices are poor in protein/iron rich meals He had donated blood when he was younger but has not donated since over 20 years ago.  He received blood transfusion in 2016 as above  Further workup from August 2025 revealed combination of anemia chronic kidney disease and iron deficiency.  The patient started taking oral iron supplement consistently between August to September 2025  Lab Results  Component Value Date   VITAMINB12 764 11/06/2023   FERRITIN 82 11/06/2023   HGB 10.5 (L) 12/07/2023  RBC 4.10 (L) 12/07/2023   Vitals:   12/07/23 1443  BP: (!) 132/95  Pulse: 86  Resp: 18  Temp: 98 F (36.7 C)  SpO2: 100%

## 2023-12-10 ENCOUNTER — Ambulatory Visit

## 2023-12-10 ENCOUNTER — Other Ambulatory Visit: Payer: Self-pay | Admitting: Hematology and Oncology

## 2023-12-10 ENCOUNTER — Ambulatory Visit: Payer: Self-pay | Admitting: Hematology and Oncology

## 2023-12-10 ENCOUNTER — Telehealth: Payer: Self-pay

## 2023-12-10 NOTE — Telephone Encounter (Signed)
 Dr. Lonn, patient will be scheduled as soon as possible.  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

## 2023-12-10 NOTE — Telephone Encounter (Signed)
-----   Message from Almarie Bedford sent at 12/10/2023  9:15 AM EDT ----- Pls call his daughter Iron studies arelow I sent order for him to get infusion at [facility], weekly for 4 doses He can stop oral iron  Also pls schedule appt to see me in 3 months with labs ----- Message ----- From: Rebecka, [facility] [place] Sent: 12/07/2023   2:46 PM EDT To: Almarie Bedford, MD

## 2023-12-10 NOTE — Telephone Encounter (Signed)
 Called daughter and given below message. She verbalized understanding and appt scheduled.

## 2023-12-11 ENCOUNTER — Ambulatory Visit (INDEPENDENT_AMBULATORY_CARE_PROVIDER_SITE_OTHER): Admitting: Family

## 2023-12-11 ENCOUNTER — Encounter (HOSPITAL_BASED_OUTPATIENT_CLINIC_OR_DEPARTMENT_OTHER): Payer: Self-pay | Admitting: Family

## 2023-12-11 VITALS — BP 142/90 | HR 75 | Ht 74.0 in | Wt 150.0 lb

## 2023-12-11 DIAGNOSIS — Z01810 Encounter for preprocedural cardiovascular examination: Secondary | ICD-10-CM

## 2023-12-11 DIAGNOSIS — I25118 Atherosclerotic heart disease of native coronary artery with other forms of angina pectoris: Secondary | ICD-10-CM

## 2023-12-11 DIAGNOSIS — E785 Hyperlipidemia, unspecified: Secondary | ICD-10-CM

## 2023-12-11 DIAGNOSIS — I10 Essential (primary) hypertension: Secondary | ICD-10-CM

## 2023-12-11 DIAGNOSIS — I7143 Infrarenal abdominal aortic aneurysm, without rupture: Secondary | ICD-10-CM

## 2023-12-11 DIAGNOSIS — H6123 Impacted cerumen, bilateral: Secondary | ICD-10-CM | POA: Diagnosis not present

## 2023-12-11 DIAGNOSIS — I77810 Thoracic aortic ectasia: Secondary | ICD-10-CM

## 2023-12-11 MED ORDER — METOPROLOL SUCCINATE ER 25 MG PO TB24: 25.0000 mg | ORAL_TABLET | Freq: Every day | ORAL | 3 refills | Status: DC

## 2023-12-11 NOTE — Progress Notes (Signed)
 Cardiology Office Note   Date:  12/11/2023  ID:  Juan Goodwin 1935-07-15, MRN [id number] PCP: Juan Carlin Redbird, MD  Juan Goodwin Cardiologist:  Juan Parchment, MD     History of Present Illness Juan Goodwin is a 88 y.o. male with hx of coronary artery disease, CVA, upper GI bleed, CKD 3, HTN, orthostatic hypotension, subdural hematoma 2019, prior tobacco use, anemia, abdominal aortic aneurysm, ascending aorta dilation.  Family history notable for mother with CHF.  Nuclear stress test 08/2019 no ischemia.  NSTEMI 04/2020 in setting of hypertensive urgency with LHC with diffuse small vessel disease and distal PDA 75%, small OM1 75%, OM 299% branches and 50% P D1 which were not amenable to PCI and recommended for medical management.  Presented to 10/2020 with chest pain with Myoview  no significant ischemia.  Carvedilol  increased and Imdur  50 mg daily added.  Seen 07/2022 by Dr. Parchment after syncope and fall.  It was felt he likely had post micturition syndrome with orthostatic hypotension as both episodes occurred after urinating in the middle of the night.  Amlodipine  was discontinued.  He was last seen 09/01/22 by Dr. Shlomo with dyspnea on exertion. Echo 09/2022 normal LVEF, severe LVH, ascending arota dialtion 4.3 cm.  Subsequent cardiac PET 12/2022 with LVEF 56%, no ischemia, low risk. CT chest 04/06/23 with ascending aorta measurment 3.9 cm.   Abdominal aortic aneurysm measuring 7.5 cm incidentally finding on CT 11/2023. By CTA 11/29/23 renal arteries patent, normal adrenal gland.   Pending clearance for EVAR with Dr. Lanis.   He presents today for preoperative clearance. He is without chest pain, pressure, tightness, heaviness. Denies shortness of breath, lightheadedness, dizziness, edema. He has no known history of anesthesia complications. He is compliant with current medications. Does not take vitamin E or fish oil.   BP elevated today. Does not monitor BP at home.  Takes all meds around 12:30 pm, has not taken yet today.   He is participating in twice weekly phyiscal therapy for 45 minutes sessions. He completes this without limitiations, and without chest pain or shortness of breath. He does similar exercise at home at least once weekly.   ROS: Please see the history of present illness.    All other systems reviewed and are negative.   Studies Reviewed EKG Interpretation Date/Time:  Tuesday December 11 2023 08:19:58 EDT Ventricular Rate:  75 PR Interval:  166 QRS Duration:  102 QT Interval:  420 QTC Calculation: 469 R Axis:   -51  Text Interpretation: Normal sinus rhythm Left anterior fascicular block  Stable compared to previous Confirmed by Juan Goodwin ([id number]) on 12/11/2023 8:23:47 AM    Cardiac Studies & Procedures   ______________________________________________________________________________________________ CARDIAC CATHETERIZATION  CARDIAC CATHETERIZATION 04/26/2020  Conclusion  RPDA lesion is 75% stenosed.  1st Mrg lesion is 75% stenosed.  2nd Mrg lesion is 99% stenosed.  These three lesions are in small branch vessels.  1st Diag lesion is 50% stenosed.  LV end diastolic pressure is normal.  There is no aortic valve stenosis.  Diffuse small vessel disease in the distal PDA and small OM branches.  Main vessels are patent. I suspect that he had demand ischemia from HTN in the setting of small vessel disease.   Given that he has been pain free for some time, we opted for medical therapy.  Continue medical therapy and BP control.  Findings Coronary Findings Diagnostic  Dominance: Right  Left Anterior Descending The vessel exhibits minimal luminal irregularities.  First Diagonal Branch 1st Diag lesion is 50% stenosed.  Left Circumflex The vessel exhibits minimal luminal irregularities.  First Obtuse Marginal Branch 1st Mrg lesion is 75% stenosed.  Second Obtuse Marginal Branch Vessel is small in size. 2nd Mrg  lesion is 99% stenosed.  Third Obtuse Marginal Branch  Right Coronary Artery Vessel is large. The vessel exhibits minimal luminal irregularities.  Right Posterior Descending Artery RPDA lesion is 75% stenosed.  Right Posterior Atrioventricular Artery There is mild disease in the vessel.  Intervention  No interventions have been documented.   STRESS TESTS  NM PET CT CARDIAC PERFUSION MULTI W/ABSOLUTE BLOODFLOW 01/09/2023  Narrative   The study is normal. The study is low risk.   LV perfusion is normal.   Rest left ventricular function is normal. Rest EF: 56%. Stress left ventricular function is normal. Stress EF: 59%. End diastolic cavity size is normal. End systolic cavity size is normal.   Myocardial blood flow was computed to be 0.61ml/g/min at rest and 2.30ml/g/min at stress. Global myocardial blood flow reserve was 2.37 and was normal.   Electronically signed by Juan Balding, MD  CLINICAL DATA:  This over-read does not include interpretation of cardiac or coronary anatomy or pathology. The Cardiac PET CT interpretation by the cardiologist is attached.  COMPARISON:  None Available.  FINDINGS: Cardiovascular: Aortic atherosclerosis. Normal heart size. Three-vessel coronary artery calcifications. No pericardial effusion.  Limited Mediastinum/Nodes: No enlarged mediastinal, hilar, or axillary lymph nodes. Trachea and esophagus demonstrate no significant findings.  Limited Lungs/Pleura: Lungs are clear. No pleural effusion or pneumothorax.  Upper Abdomen: No acute abnormality.  Musculoskeletal: No chest wall abnormality. No acute osseous findings.  IMPRESSION: 1. No acute CT findings of the included chest. 2. Coronary artery disease.  Aortic Atherosclerosis (ICD10-I70.0).   Electronically Signed By: Juan JONETTA Jaksch M.D. On: 01/09/2023 11:26   ECHOCARDIOGRAM  ECHOCARDIOGRAM COMPLETE 10/02/2022  Narrative ECHOCARDIOGRAM REPORT    Patient Name:    Juan Goodwin Date of Exam: 10/02/2022 Medical Rec #:  [id number]      Height:       74.0 in Accession #:    [id number]     Weight:       157.0 lb Date of Birth:  Jul 21, 1935      BSA:          1.961 m Patient Age:    87 years       BP:           122/76 mmHg Patient Gender: M              HR:           76 bpm. Exam Location:  [address]  Procedure: 2D Echo, Cardiac Doppler and Color Doppler  Indications:    R06.09 SOB  History:        Patient has prior history of Echocardiogram examinations, most recent 04/27/2021. Signs/Symptoms:Shortness of Breath; Risk Factors:Dilated aortic root and Dyslipidemia.  Sonographer:    Elsie Bohr RDCS Referring Phys: [phone] TRACI R TURNER   Sonographer Comments: Technically difficult study due to poor echo windows. IMPRESSIONS   1. Left ventricular ejection fraction, by estimation, is 60 to 65%. The left ventricle has normal function. The left ventricle has no regional wall motion abnormalities. There is severe asymmetric left ventricular hypertrophy. Left ventricular diastolic parameters are consistent with Grade I diastolic dysfunction (impaired relaxation). 2. Right ventricular systolic function is normal. The right ventricular size is normal. Moderately increased right ventricular wall thickness.  3. The mitral valve is degenerative. No evidence of mitral valve regurgitation. 4. The aortic valve was not well visualized. Aortic valve regurgitation is trivial. No aortic stenosis is present. 5. Aortic dilatation noted. There is mild dilatation of the ascending aorta, measuring 43 mm.  Comparison(s): Prior images reviewed side by side. Acending diameter increased from prior; technically difficult study.  Conclusion(s)/Recommendation(s): In the setting of biventricular hypertrophy, nuclear scintigraphy imaging (PYP) may be considered.  FINDINGS Left Ventricle: Left ventricular ejection fraction, by estimation, is 60 to 65%. The left ventricle has  normal function. The left ventricle has no regional wall motion abnormalities. The left ventricular internal cavity size was normal in size. There is severe asymmetric left ventricular hypertrophy. Left ventricular diastolic parameters are consistent with Grade I diastolic dysfunction (impaired relaxation).  Right Ventricle: The right ventricular size is normal. Moderately increased right ventricular wall thickness. Right ventricular systolic function is normal.  Left Atrium: Left atrial size was normal in size.  Right Atrium: Right atrial size was normal in size.  Pericardium: There is no evidence of pericardial effusion.  Mitral Valve: The mitral valve is degenerative in appearance. No evidence of mitral valve regurgitation.  Tricuspid Valve: The tricuspid valve is grossly normal. Tricuspid valve regurgitation is trivial.  Aortic Valve: The aortic valve was not well visualized. Aortic valve regurgitation is trivial. No aortic stenosis is present.  Pulmonic Valve: The pulmonic valve was not well visualized. Pulmonic valve regurgitation is not visualized. No evidence of pulmonic stenosis.  Aorta: Aortic dilatation noted. Ascending aorta measurements are within normal limits for age when indexed to body surface area. There is mild dilatation of the ascending aorta, measuring 43 mm.  IAS/Shunts: No atrial level shunt detected by color flow Doppler.   LEFT VENTRICLE PLAX 2D LVIDd:         4.20 cm   Diastology LVIDs:         2.80 cm   LV e' medial:    6.31 cm/s LV PW:         1.30 cm   LV E/e' medial:  9.7 LV IVS:        1.60 cm   LV e' lateral:   8.81 cm/s LVOT diam:     2.00 cm   LV E/e' lateral: 7.0 LV SV:         49 LV SV Index:   25 LVOT Area:     3.14 cm   RIGHT VENTRICLE RV S prime:     12.50 cm/s TAPSE (M-mode): 1.1 cm RVSP:           24.0 mmHg  LEFT ATRIUM             Index        RIGHT ATRIUM           Index LA diam:        3.60 cm 1.84 cm/m   RA Pressure: 3.00  mmHg LA Vol (A2C):   49.3 ml 25.14 ml/m  RA Area:     10.20 cm LA Vol (A4C):   34.8 ml 17.75 ml/m  RA Volume:   22.30 ml  11.37 ml/m LA Biplane Vol: 44.7 ml 22.79 ml/m AORTIC VALVE LVOT Vmax:   80.60 cm/s LVOT Vmean:  54.400 cm/s LVOT VTI:    0.157 m  AORTA Ao Root diam: 3.80 cm Ao Asc diam:  4.30 cm  MITRAL VALVE               TRICUSPID VALVE  MV Area (PHT): 2.39 cm    TR Peak grad:   21.0 mmHg MV Decel Time: 317 msec    TR Vmax:        229.00 cm/s MV E velocity: 61.30 cm/s  Estimated RAP:  3.00 mmHg MV A velocity: 88.60 cm/s  RVSP:           24.0 mmHg MV E/A ratio:  0.69 SHUNTS Systemic VTI:  0.16 m Systemic Diam: 2.00 cm  Stanly Leavens MD Electronically signed by Stanly Leavens MD Signature Date/Time: 10/02/2022/12:01:20 PM    Final          ______________________________________________________________________________________________      Risk Assessment/Calculations   HYPERTENSION CONTROL Vitals:   12/11/23 0812 12/11/23 0845  BP: (!) 140/90 (!) 142/90    The patient's blood pressure is elevated above target today.  In order to address the patient's elevated BP: A new medication was prescribed today.          Physical Exam VS:  BP (!) 142/90   Pulse 75   Ht 6' 2 (1.88 m)   Wt 150 lb (68 kg)   BMI 19.26 kg/m   BP Readings from Last 3 Encounters:  12/11/23 (!) 142/90  12/07/23 (!) 132/95  12/06/23 (!) 171/112        Wt Readings from Last 3 Encounters:  12/11/23 150 lb (68 kg)  12/07/23 153 lb (69.4 kg)  12/06/23 151 lb 4.8 oz (68.6 kg)    GEN: Well nourished, well developed in no acute distress NECK: No JVD; No carotid bruits CARDIAC: RRR, no murmurs, rubs, gallops; radial and PT pulses 2+ bilaterally RESPIRATORY:  Clear to auscultation without rales, wheezing or rhonchi  ABDOMEN: Soft, non-tender, non-distended EXTREMITIES:  No edema; No deformity   ASSESSMENT AND PLAN  Preop / AAA - EVAR pending with Dr. Lanis for  7.5cm infrarenal abdominal aneurysm. According to the Revised Cardiac Risk Index (RCRI), his Perioperative Risk of Major Cardiac Event is (%): 11. Based on his activity at PT twice per week and home exercises capacity >4METS.  Cardiac PET 12/2022 - low risk. Per AHA/ACC guidelines, he is deemed acceptable risk for the planned procedure without additional cardiovascular testing. Will route to surgical team so they are aware.  May hold Aspirin  7 days prior to procedure if necessary.   2. HTN, goal <130/80 - current therapy of imdur  30mg  daily; BP trends not at goal. Continue imdur  30mg  daily. Start metoprolol succinate 25mg  daily for BP and cardiovascular risk lowering benefit. Discussed to monitor BP at home at least 2 hours after medications and sitting for 5-10 minutes.     3. CAD / HLD goal LDL <70 - 2022 cath with diffuse small vessel disease in distal PDA, small OM, medial therapy recommended. On imdur  30mg  daily. Completed cardiac PET 10/024, low risk. Ischemia evaluation deferred in absence of anginal symptoms. 09/2023 LDL 95.  Continue GDMT: aspirin  81mg , rosuvastatin  40mg  daily. Add Toprol 25mg  daily, as above.  Consider repeat lipid panel at follow up. If LDL not at goal, could add zetia  or consider PCSK9i  4. Ascending aorta dilation - 03/2023 CT 3.9cm. repeat echo 03/2024 already ordered per Dr. Shlomo. Continue BP control, as above.    Dispo: 4 months with Dr. Jeffrie  Signed, Reche GORMAN Finder, NP

## 2023-12-11 NOTE — Patient Instructions (Signed)
 Medication Instructions:   START Toprol XL one (1) tablet by mouth ( 25 mg) daily.   *If you need a refill on your cardiac medications before your next appointment, please call your pharmacy*  Lab Work:  None ordered.  If you have labs (blood work) drawn today and your tests are completely normal, you will receive your results only by: MyChart Message (if you have MyChart) OR A paper copy in the mail If you have any lab test that is abnormal or we need to change your treatment, we will call you to review the results.  Testing/Procedures:  None ordered.   Follow-Up: At [facility], you and your health needs are our priority.  As part of our continuing mission to provide you with exceptional heart care, our providers are all part of one team.  This team includes your primary Cardiologist (physician) and Advanced Practice Providers or APPs (Physician Assistants and Nurse Practitioners) who all work together to provide you with the care you need, when you need it.  Your next appointment:   4 month(s)  Provider:   Oneil Parchment, MD    We recommend signing up for the patient portal called MyChart.  Sign up information is provided on this After Visit Summary.  MyChart is used to connect with patients for Virtual Visits (Telemedicine).  Patients are able to view lab/test results, encounter notes, upcoming appointments, etc.  Non-urgent messages can be sent to your provider as well.   To learn more about what you can do with MyChart, go to [email or web address].   Other Instructions  Your physician wants you to follow-up in: 4 months.  You will receive a reminder letter in the mail two months in advance. If you don't receive a letter, please call our office to schedule the follow-up appointment.

## 2023-12-12 ENCOUNTER — Ambulatory Visit: Attending: Family Medicine

## 2023-12-12 DIAGNOSIS — M6281 Muscle weakness (generalized): Secondary | ICD-10-CM | POA: Insufficient documentation

## 2023-12-12 DIAGNOSIS — R2689 Other abnormalities of gait and mobility: Secondary | ICD-10-CM | POA: Diagnosis not present

## 2023-12-12 NOTE — Therapy (Signed)
 OUTPATIENT PHYSICAL THERAPY LOWER EXTREMITY TREATMENT   Patient Name: Juan Goodwin MRN: [id number] DOB:Jan 10, 1936, 88 y.o., male Today's Date: 12/12/2023  END OF SESSION:  PT End of Session - 12/12/23 1523     Visit Number 6    Date for Recertification  01/24/24    Authorization Type [employer] Medicare    Progress Note Due on Visit 10    PT Start Time 1520    PT Stop Time 1600    PT Time Calculation (min) 40 min    Activity Tolerance Patient tolerated treatment well    Behavior During Therapy [facility] for tasks assessed/performed               Past Medical History:  Diagnosis Date   AAA (abdominal aortic aneurysm)    Acute pyloric channel ulcer 07/13/2014   Acute upper GI bleed 07/13/2014   Aortic atherosclerosis    Ascending aorta dilatation    4.3cm by echo 09/2022 and 3.9cm by Chest CT 03/2023.   Blood transfusion without reported diagnosis    CAD (coronary artery disease)    Cerebrovascular disease 10/08/2018   Chronic kidney disease, stage 3a (HCC) 03/30/2017   Dilated aortic root    38 mm by echo with 04/2021   Gastrointestinal hemorrhage with melena 06/2014   Global aphasia 03/27/2017   Helicobacter pylori gastritis 07/13/2014   High cholesterol    Hypernatremia 07/13/2014   Hypertension    Hypokalemia 07/13/2014   Normocytic normochromic anemia 04/10/2017   REM sleep behavior disorder 10/08/2018   Subdural hematoma (HCC) 03/26/2017   Thrombocytopenia 07/13/2014   Past Surgical History:  Procedure Laterality Date   BRAIN SURGERY     CRANIOTOMY Left 03/26/2017   Procedure: CRANIOTOMY HEMATOMA EVACUATION SUBDURAL;  Surgeon: Onetha Kuba, MD;  Location: [facility] OR;  Service: Neurosurgery;  Laterality: Left;   ESOPHAGOGASTRODUODENOSCOPY N/A 07/11/2014   Procedure: ESOPHAGOGASTRODUODENOSCOPY (EGD);  Surgeon: Jerrell Sol, MD;  Location: [facility];  Service: Endoscopy;  Laterality: N/A;   LEFT HEART CATH AND CORONARY ANGIOGRAPHY N/A 04/26/2020   Procedure: LEFT  HEART CATH AND CORONARY ANGIOGRAPHY;  Surgeon: Dann Candyce RAMAN, MD;  Location: [facility] INVASIVE CV LAB;  Service: Cardiovascular;  Laterality: N/A;   Patient Active Problem List   Diagnosis Date Noted   Deficiency anemia 11/06/2023   Sarcopenia 11/06/2023   Chronic constipation 11/06/2023   Aortic atherosclerosis    Ascending aorta dilatation    CAD (coronary artery disease) 10/12/2020   Angina at rest 10/11/2020   Elevated troponin    Chest pain 04/25/2020   NSTEMI (non-ST elevated myocardial infarction) (HCC) 04/25/2020   Hyperlipidemia 08/08/2019   Cerebrovascular disease 10/08/2018   REM sleep behavior disorder 10/08/2018   Weakness 04/10/2017   Dehydration 04/10/2017   Acute kidney injury superimposed on chronic kidney disease 04/10/2017   Normocytic normochromic anemia 04/10/2017   Chronic kidney disease (CKD), stage III (moderate) (HCC) 03/30/2017   HTN (hypertension), malignant 03/29/2017   Orthostatic hypotension 03/27/2017   Global aphasia 03/27/2017   Subdural hematoma (HCC) 03/26/2017   Acute pyloric channel ulcer 07/13/2014   Hypokalemia 07/13/2014   Acute upper GI bleed 07/13/2014   ARF (acute renal failure) 07/13/2014   Hypernatremia 07/13/2014   Hypotension 07/13/2014   Thrombocytopenia 07/13/2014   Helicobacter pylori gastritis 07/13/2014   Gastrointestinal hemorrhage with melena    Melena 07/11/2014   GI bleed 07/11/2014   Acute blood loss anemia 07/10/2014    PCP: Carlin Gull  REFERRING PROVIDER: Carlin Gull  REFERRING DIAG:  R53.1 (ICD-10-CM) - Weakness   THERAPY DIAG:  Other abnormalities of gait and mobility  Muscle weakness (generalized)  Rationale for Evaluation and Treatment: Rehabilitation  ONSET DATE: 10/19/23 referral date  SUBJECTIVE:   SUBJECTIVE STATEMENT:I think I'm getting stronger, daughter reports had hearing aides repaired.  Daughter reports his doctor sent him here because he does not have a lot of physical activity a lot.    PERTINENT HISTORY: See above   PAIN:  Are you having pain? No  PRECAUTIONS: Fall  RED FLAGS: None   WEIGHT BEARING RESTRICTIONS: No  FALLS:  Has patient fallen in last 6 months? No  LIVING ENVIRONMENT: Lives with: lives with their daughter Lives in: House/apartment Stairs: No Has following equipment at home: Single point cane  OCCUPATION: Retired  PLOF: Independent with basic ADLs and Independent with gait  PATIENT GOALS: find out why they sent me here and do whatever I need to   NEXT MD VISIT:   OBJECTIVE:  Note: Objective measures were completed at Evaluation unless otherwise noted.  DIAGNOSTIC FINDINGS: N/A  COGNITION: Overall cognitive status: Within functional limits for tasks assessed and slower processing, daughter is here to fill in subjective pieces      SENSATION: WFL   POSTURE: rounded shoulders, forward head, and flexed trunk   LOWER EXTREMITY ROM: grossly WFL   LOWER EXTREMITY MMT:  MMT Right eval Left eval  Hip flexion 3 3-  Hip extension    Hip abduction 4 4  Hip adduction    Hip internal rotation    Hip external rotation    Knee flexion 4 4  Knee extension 4 3+  Ankle dorsiflexion    Ankle plantarflexion    Ankle inversion    Ankle eversion     (Blank rows = not tested)   FUNCTIONAL TESTS:  5 times sit to stand: 27s with push off  Timed up and go (TUG): 24s  GAIT: Distance walked: in clinic distances Assistive device utilized: Single point cane Level of assistance: Modified independence Comments: slowed speed, decreased foot clearance, flexed trunk                                                                                                                                TREATMENT DATE:  12/12/23: Nustep L 5 x 6 min Standing therabar 3# forward shoulder presses 10 x 2 Standing therabar 3# horizontal shoulder swings in stanidng Gait 3 laps with cane, 300' Sit to stand from elevated mat with red mediball punches  10 x Forward taps to 4 step ,  B hands on rails 10 each Standing on airex or B heel raises in ll bars  Forward step ups onto 4 step 10 x each leg Seated for blue theraband rows 2 x 10 Standing at counter for alt hip abd  12/05/23 NuStep L5x49mins  TUG 19s Ball squeeze 2x10 STS 2x10 from elevated mat  HS curls 25# 2x10 Leg ext 5#  2x10 Seated rows green band 2x10 OHP with yellow ball 2x10 Chest press with yellow ball 2x10 Seated rotations with yellow ball 2x10  12/03/23:  Nustep Ue's and LE's 6 min level 5  Seated rows 15 # 20 reps Seated lat pulls 20# 15 reps B knee flexion 25#  B knee ext 5# 1 set 20, therapist assisted with terminal extension Gait x 400' with st cane, 4 laps Leg press 20# 15 x B Les and staggered position, 15 x each  Standing therabar 2# forward shoulder presses 10 x 2  11/28/23:  Nustep L 5 x 6 min Ue's and LE's Standing B heel raises 10 x 2  Seated rows 20# 15x Seated B knee ext 5# 10x  Seated B knee flexion 25# 2 x 10 Seated yellow theraband B shoulder ER 15 x Gait x 300', 3 laps with st cane  Leg press B  20 # 2 x 10 Standing forward punches with 2# therabar 10 x Standing alt high marches 12 x  11/26/23:  Nustep level 5 x 6 min Ue's and LE's B knee ext 5# 1 x 10 B knee flexion 25# 1 x 10  Standing high marches  Standing B heel raises B hand support Sit to stand from elevated mat, 10 x hands on knees  Seated rows, blue band, 15 reps Seated B shoulder ER yellow band 15 reps Standing forward punches with 2# therabar 10 reps   Gait with st cane 2 laps, 200'  Leg press 20# 10 x 1   11/15/23- EVAL, HEP    PATIENT EDUCATION:  Education details: HEP and POC Person educated: Patient and Child(ren) Education method: Medical illustrator Education comprehension: verbalized understanding, returned demonstration, verbal cues required, and tactile cues required  HOME EXERCISE PROGRAM: Access Code: [email or web address] URL:  [email or web address] Date: 11/15/2023 Prepared by: Almetta Fam  Exercises - Sit to Stand with Armchair  - 1 x daily - 7 x weekly - 2 sets - 10 reps - Standing March with Counter Support  - 1 x daily - 7 x weekly - 2 sets - 10 reps - Standing Hip Abduction with Counter Support  - 1 x daily - 7 x weekly - 2 sets - 10 reps - Heel Raises with Counter Support  - 1 x daily - 7 x weekly - 2 sets - 10 reps - Seated Hip Abduction with Resistance  - 1 x daily - 7 x weekly - 2 sets - 10 reps - Seated March with Resistance  - 1 x daily - 7 x weekly - 2 sets - 10 reps  ASSESSMENT:  CLINICAL IMPRESSION:   Continued to focus session on bulk strengthening as well as endurance training.  Sit to stand more smooth, less effort.  Less frequent rest breaks and less time for rest breaks today. Should benefit from ongoing PT to address his stamina and efficiency with transitional movements .  Patient is a 88 y.o. male who was seen today for physical therapy evaluation and treatment for weakness. He has some weakness in his lower extremities, especially with hip flexion. The LLE is weaker than the R. He has been ambulating with a cane for about a year. Patient lives with daughter, and she reports that he is sedentary and lack physical activity. He will benefit from skilled PT to address his strength deficits to improve functional independence and decrease his risk for falls. Patient is hard of hearing and has some delayed processing.   OBJECTIVE IMPAIRMENTS: Abnormal gait,  decreased balance, decreased strength, and decreased safety awareness.   ACTIVITY LIMITATIONS: sitting, standing, squatting, stairs, and locomotion level  PERSONAL FACTORS: Age and Fitness are also affecting patient's functional outcome.   REHAB POTENTIAL: Good  CLINICAL DECISION MAKING: Stable/uncomplicated  EVALUATION COMPLEXITY: Low  GOALS: Goals reviewed with patient? Yes  SHORT TERM GOALS: Target date:  12/20/23  Patient will be independent with initial HEP. Baseline:  Goal status: 12/03/23: progressing  2.  Patient will demonstrate decreased fall risk by scoring < 20 sec on TUG. Baseline: 24s Goal status: MET 19s 12/05/23  3.  Patient will be educated on strategies to decrease risk of falls.  Baseline:  Goal status: IN PROGRESS    LONG TERM GOALS: Target date: 01/24/24  Patient will be independent with advanced/ongoing HEP to improve outcomes and carryover.  Baseline:  Goal status: INITIAL  2.  Patient will complete 5xSTS <20s without UE use Baseline: 27s with push off  Goal status: INITIAL  3.  Patient will demonstrate improved functional LE strength as demonstrated by 5/5 in BLE. Baseline:  Goal status: INITIAL  4.  Patient will demonstrate decreased fall risk by scoring < 15 sec on TUG. Baseline: 24s Goal status: INITIAL    PLAN:  PT FREQUENCY: 2x/week  PT DURATION: 10 weeks  PLANNED INTERVENTIONS: 97110-Therapeutic exercises, 97530- Therapeutic activity, [id number]- Neuromuscular re-education, 97535- Self Care, [id number]- Manual therapy, [phone]- Gait training, Patient/Family education, Balance training, Stair training, Cryotherapy, and Moist heat  PLAN FOR NEXT SESSION: LE and functional strengthening    Hondo Nanda L Eilah Common, PT, DPT 12/12/2023, 5:06 PM

## 2023-12-17 ENCOUNTER — Other Ambulatory Visit: Payer: Self-pay

## 2023-12-17 DIAGNOSIS — I7143 Infrarenal abdominal aortic aneurysm, without rupture: Secondary | ICD-10-CM

## 2023-12-18 ENCOUNTER — Ambulatory Visit: Admitting: *Deleted

## 2023-12-18 VITALS — BP 155/91 | HR 75 | Temp 98.6°F | Resp 20 | Ht 74.0 in | Wt 151.6 lb

## 2023-12-18 DIAGNOSIS — D509 Iron deficiency anemia, unspecified: Secondary | ICD-10-CM | POA: Diagnosis not present

## 2023-12-18 DIAGNOSIS — D631 Anemia in chronic kidney disease: Secondary | ICD-10-CM | POA: Diagnosis not present

## 2023-12-18 DIAGNOSIS — D539 Nutritional anemia, unspecified: Secondary | ICD-10-CM

## 2023-12-18 DIAGNOSIS — N189 Chronic kidney disease, unspecified: Secondary | ICD-10-CM

## 2023-12-18 MED ORDER — ACETAMINOPHEN 325 MG PO TABS
650.0000 mg | ORAL_TABLET | Freq: Once | ORAL | Status: AC
  Administered 2023-12-18: 650 mg via ORAL
  Filled 2023-12-18: qty 2

## 2023-12-18 MED ORDER — DIPHENHYDRAMINE HCL 25 MG PO CAPS
25.0000 mg | ORAL_CAPSULE | Freq: Once | ORAL | Status: AC
  Administered 2023-12-18: 25 mg via ORAL
  Filled 2023-12-18: qty 1

## 2023-12-18 MED ORDER — IRON SUCROSE 20 MG/ML IV SOLN
200.0000 mg | Freq: Once | INTRAVENOUS | Status: AC
  Administered 2023-12-18: 200 mg via INTRAVENOUS
  Filled 2023-12-18: qty 10

## 2023-12-18 NOTE — Progress Notes (Signed)
 Diagnosis: Iron Deficiency Anemia  Provider:  Chilton Greathouse MD  Procedure: IV Push  IV Type: Peripheral, IV Location: L Antecubital  Venofer (Iron Sucrose), Dose: 200 mg  Post Infusion IV Care: Observation period completed and Peripheral IV Discontinued  Discharge: Condition: Good, Destination: Home . AVS Provided  Performed by:  Loney Hering, LPN

## 2023-12-20 ENCOUNTER — Ambulatory Visit

## 2023-12-20 VITALS — BP 113/71 | HR 95 | Temp 97.4°F | Resp 16 | Ht 74.0 in | Wt 153.0 lb

## 2023-12-20 DIAGNOSIS — D539 Nutritional anemia, unspecified: Secondary | ICD-10-CM

## 2023-12-20 DIAGNOSIS — D509 Iron deficiency anemia, unspecified: Secondary | ICD-10-CM

## 2023-12-20 DIAGNOSIS — D631 Anemia in chronic kidney disease: Secondary | ICD-10-CM | POA: Diagnosis not present

## 2023-12-20 DIAGNOSIS — N189 Chronic kidney disease, unspecified: Secondary | ICD-10-CM

## 2023-12-20 MED ORDER — IRON SUCROSE 20 MG/ML IV SOLN
200.0000 mg | Freq: Once | INTRAVENOUS | Status: AC
  Administered 2023-12-20: 200 mg via INTRAVENOUS
  Filled 2023-12-20: qty 10

## 2023-12-20 MED ORDER — ACETAMINOPHEN 325 MG PO TABS
650.0000 mg | ORAL_TABLET | Freq: Once | ORAL | Status: AC
  Administered 2023-12-20: 650 mg via ORAL
  Filled 2023-12-20: qty 2

## 2023-12-20 MED ORDER — DIPHENHYDRAMINE HCL 25 MG PO CAPS
25.0000 mg | ORAL_CAPSULE | Freq: Once | ORAL | Status: AC
  Administered 2023-12-20: 25 mg via ORAL
  Filled 2023-12-20: qty 1

## 2023-12-20 NOTE — Progress Notes (Signed)
 Diagnosis: Iron Deficiency Anemia  Provider:  Praveen Mannam MD  Procedure: IV Push  IV Type: Peripheral, IV Location: L Antecubital  Venofer (Iron Sucrose), Dose: 200 mg  Post Infusion IV Care: Observation period completed and Peripheral IV Discontinued  Discharge: Condition: Good, Destination: Home . AVS Declined  Performed by:  Surya Schroeter, RN

## 2023-12-24 ENCOUNTER — Other Ambulatory Visit: Payer: Self-pay

## 2023-12-24 ENCOUNTER — Ambulatory Visit

## 2023-12-24 DIAGNOSIS — M6281 Muscle weakness (generalized): Secondary | ICD-10-CM | POA: Diagnosis not present

## 2023-12-24 DIAGNOSIS — R2689 Other abnormalities of gait and mobility: Secondary | ICD-10-CM | POA: Diagnosis not present

## 2023-12-24 NOTE — Therapy (Signed)
 OUTPATIENT PHYSICAL THERAPY LOWER EXTREMITY TREATMENT   Patient Name: Juan Goodwin MRN: [id number] DOB:1935/08/06, 88 y.o., male Today's Date: 12/24/2023  END OF SESSION:  PT End of Session - 12/24/23 1554     Visit Number 7    Date for Recertification  01/24/24    Authorization Type A[employer]na Medicare    Progress Note Due on Visit 10    PT Start Time 1515    PT Stop Time 1600    PT Time Calculation (min) 45 min    Activity Tolerance Patient tolerated treatment well;Patient limited by fatigue    Behavior During Therapy [facility] for tasks assessed/performed                Past Medical History:  Diagnosis Date   AAA (abdominal aortic aneurysm)    Acute pyloric channel ulcer 07/13/2014   Acute upper GI bleed 07/13/2014   Aortic atherosclerosis    Ascending aorta dilatation    4.3cm by echo 09/2022 and 3.9cm by Chest CT 03/2023.   Blood transfusion without reported diagnosis    CAD (coronary artery disease)    Cerebrovascular disease 10/08/2018   Chronic kidney disease, stage 3a (HCC) 03/30/2017   Dilated aortic root    38 mm by echo with 04/2021   Gastrointestinal hemorrhage with melena 06/2014   Global aphasia 03/27/2017   Helicobacter pylori gastritis 07/13/2014   High cholesterol    Hypernatremia 07/13/2014   Hypertension    Hypokalemia 07/13/2014   Normocytic normochromic anemia 04/10/2017   REM sleep behavior disorder 10/08/2018   Subdural hematoma (HCC) 03/26/2017   Thrombocytopenia 07/13/2014   Past Surgical History:  Procedure Laterality Date   BRAIN SURGERY     CRANIOTOMY Left 03/26/2017   Procedure: CRANIOTOMY HEMATOMA EVACUATION SUBDURAL;  Surgeon: Onetha Kuba, MD;  Location: [facility] OR;  Service: Neurosurgery;  Laterality: Left;   ESOPHAGOGASTRODUODENOSCOPY N/A 07/11/2014   Procedure: ESOPHAGOGASTRODUODENOSCOPY (EGD);  Surgeon: Jerrell Sol, MD;  Location: [facility];  Service: Endoscopy;  Laterality: N/A;   LEFT HEART CATH AND CORONARY ANGIOGRAPHY N/A  04/26/2020   Procedure: LEFT HEART CATH AND CORONARY ANGIOGRAPHY;  Surgeon: Dann Candyce RAMAN, MD;  Location: [facility] INVASIVE CV LAB;  Service: Cardiovascular;  Laterality: N/A;   Patient Active Problem List   Diagnosis Date Noted   Deficiency anemia 11/06/2023   Sarcopenia 11/06/2023   Chronic constipation 11/06/2023   Aortic atherosclerosis    Ascending aorta dilatation    CAD (coronary artery disease) 10/12/2020   Angina at rest 10/11/2020   Elevated troponin    Chest pain 04/25/2020   NSTEMI (non-ST elevated myocardial infarction) (HCC) 04/25/2020   Hyperlipidemia 08/08/2019   Cerebrovascular disease 10/08/2018   REM sleep behavior disorder 10/08/2018   Weakness 04/10/2017   Dehydration 04/10/2017   Acute kidney injury superimposed on chronic kidney disease 04/10/2017   Normocytic normochromic anemia 04/10/2017   Chronic kidney disease (CKD), stage III (moderate) (HCC) 03/30/2017   HTN (hypertension), malignant 03/29/2017   Orthostatic hypotension 03/27/2017   Global aphasia 03/27/2017   Subdural hematoma (HCC) 03/26/2017   Acute pyloric channel ulcer 07/13/2014   Hypokalemia 07/13/2014   Acute upper GI bleed 07/13/2014   ARF (acute renal failure) 07/13/2014   Hypernatremia 07/13/2014   Hypotension 07/13/2014   Thrombocytopenia 07/13/2014   Helicobacter pylori gastritis 07/13/2014   Gastrointestinal hemorrhage with melena    Melena 07/11/2014   GI bleed 07/11/2014   Acute blood loss anemia 07/10/2014    PCP: Carlin Gull  REFERRING PROVIDER: Carlin  Ross  REFERRING DIAG: R53.1 (ICD-10-CM) - Weakness   THERAPY DIAG:  Other abnormalities of gait and mobility  Muscle weakness (generalized)  Rationale for Evaluation and Treatment: Rehabilitation  ONSET DATE: 10/19/23 referral date  SUBJECTIVE:   SUBJECTIVE STATEMENT:I dont feel too well today, nothing specific Daughter reports his doctor sent him here because he does not have a lot of physical activity a lot.    PERTINENT HISTORY: See above   PAIN:  Are you having pain? No  PRECAUTIONS: Fall  RED FLAGS: None   WEIGHT BEARING RESTRICTIONS: No  FALLS:  Has patient fallen in last 6 months? No  LIVING ENVIRONMENT: Lives with: lives with their daughter Lives in: House/apartment Stairs: No Has following equipment at home: Single point cane  OCCUPATION: Retired  PLOF: Independent with basic ADLs and Independent with gait  PATIENT GOALS: find out why they sent me here and do whatever I need to   NEXT MD VISIT:   OBJECTIVE:  Note: Objective measures were completed at Evaluation unless otherwise noted.  DIAGNOSTIC FINDINGS: N/A  COGNITION: Overall cognitive status: Within functional limits for tasks assessed and slower processing, daughter is here to fill in subjective pieces      SENSATION: WFL   POSTURE: rounded shoulders, forward head, and flexed trunk   LOWER EXTREMITY ROM: grossly WFL   LOWER EXTREMITY MMT:  MMT Right eval Left eval  Hip flexion 3 3-  Hip extension    Hip abduction 4 4  Hip adduction    Hip internal rotation    Hip external rotation    Knee flexion 4 4  Knee extension 4 3+  Ankle dorsiflexion    Ankle plantarflexion    Ankle inversion    Ankle eversion     (Blank rows = not tested)   FUNCTIONAL TESTS:  5 times sit to stand: 27s with push off  Timed up and go (TUG): 24s  GAIT: Distance walked: in clinic distances Assistive device utilized: Single point cane Level of assistance: Modified independence Comments: slowed speed, decreased foot clearance, flexed trunk                                                                                                                                TREATMENT DATE:  12/24/23:  BP L arm 115 /87, HR 103 Nustep L 5 x 6 min Ue's  and LE's  B knee flexion 25# 3 x 10 B knee ext 5# 3 x 10 Standing for horizontal arm swings with therabar, 2# 15 x Standing forward punches with 2 # medibar 10  x Sit to stand from elevated mat with red mediball forward punches 5 x 2 Gait 3 laps, 100' with cane  Seated rows with blue t band 15 x Standing for hip abd, 5 x 2 each leg    12/12/23: Nustep L 5 x 6 min Standing therabar 3# forward shoulder presses 10 x 2 Standing therabar 3# horizontal  shoulder swings in stanidng Gait 3 laps with cane, 300' Sit to stand from elevated mat with red mediball punches 10 x Forward taps to 4 step ,  B hands on rails 10 each Standing on airex or B heel raises in ll bars  Forward step ups onto 4 step 10 x each leg Seated for blue theraband rows 2 x 10 Standing at counter for alt hip abd  12/05/23 NuStep L5x13mins  TUG 19s Ball squeeze 2x10 STS 2x10 from elevated mat  HS curls 25# 2x10 Leg ext 5# 2x10 Seated rows green band 2x10 OHP with yellow ball 2x10 Chest press with yellow ball 2x10 Seated rotations with yellow ball 2x10  12/03/23:  Nustep Ue's and LE's 6 min level 5  Seated rows 15 # 20 reps Seated lat pulls 20# 15 reps B knee flexion 25#  B knee ext 5# 1 set 20, therapist assisted with terminal extension Gait x 400' with st cane, 4 laps Leg press 20# 15 x B Les and staggered position, 15 x each  Standing therabar 2# forward shoulder presses 10 x 2  11/28/23:  Nustep L 5 x 6 min Ue's and LE's Standing B heel raises 10 x 2  Seated rows 20# 15x Seated B knee ext 5# 10x  Seated B knee flexion 25# 2 x 10 Seated yellow theraband B shoulder ER 15 x Gait x 300', 3 laps with st cane  Leg press B  20 # 2 x 10 Standing forward punches with 2# therabar 10 x Standing alt high marches 12 x  11/26/23:  Nustep level 5 x 6 min Ue's and LE's B knee ext 5# 1 x 10 B knee flexion 25# 1 x 10  Standing high marches  Standing B heel raises B hand support Sit to stand from elevated mat, 10 x hands on knees  Seated rows, blue band, 15 reps Seated B shoulder ER yellow band 15 reps Standing forward punches with 2# therabar 10 reps   Gait with st cane  2 laps, 200'  Leg press 20# 10 x 1   11/15/23- EVAL, HEP    PATIENT EDUCATION:  Education details: HEP and POC Person educated: Patient and Child(ren) Education method: Medical illustrator Education comprehension: verbalized understanding, returned demonstration, verbal cues required, and tactile cues required  HOME EXERCISE PROGRAM: Access Code: [email or web address] URL: [email or web address] Date: 11/15/2023 Prepared by: Almetta Fam  Exercises - Sit to Stand with Armchair  - 1 x daily - 7 x weekly - 2 sets - 10 reps - Standing March with Counter Support  - 1 x daily - 7 x weekly - 2 sets - 10 reps - Standing Hip Abduction with Counter Support  - 1 x daily - 7 x weekly - 2 sets - 10 reps - Heel Raises with Counter Support  - 1 x daily - 7 x weekly - 2 sets - 10 reps - Seated Hip Abduction with Resistance  - 1 x daily - 7 x weekly - 2 sets - 10 reps - Seated March with Resistance  - 1 x daily - 7 x weekly - 2 sets - 10 reps  ASSESSMENT:  CLINICAL IMPRESSION:   Continued to focus session on bulk strengthening as well as endurance training. He was more fatigued today than at previous sessions, required more frequent and longer rest breaks .   No specific reason or c/o shortness of breath, etc.  Vital signs normal. Should benefit from ongoing PT to address his stamina and  efficiency with transitional movements .  Patient is a 88 y.o. male who was seen today for physical therapy evaluation and treatment for weakness. He has some weakness in his lower extremities, especially with hip flexion. The LLE is weaker than the R. He has been ambulating with a cane for about a year. Patient lives with daughter, and she reports that he is sedentary and lack physical activity. He will benefit from skilled PT to address his strength deficits to improve functional independence and decrease his risk for falls. Patient is hard of hearing and has some delayed processing.   OBJECTIVE  IMPAIRMENTS: Abnormal gait, decreased balance, decreased strength, and decreased safety awareness.   ACTIVITY LIMITATIONS: sitting, standing, squatting, stairs, and locomotion level  PERSONAL FACTORS: Age and Fitness are also affecting patient's functional outcome.   REHAB POTENTIAL: Good  CLINICAL DECISION MAKING: Stable/uncomplicated  EVALUATION COMPLEXITY: Low  GOALS: Goals reviewed with patient? Yes  SHORT TERM GOALS: Target date: 12/20/23  Patient will be independent with initial HEP. Baseline:  Goal status: 12/03/23: progressing  2.  Patient will demonstrate decreased fall risk by scoring < 20 sec on TUG. Baseline: 24s Goal status: MET 19s 12/05/23  3.  Patient will be educated on strategies to decrease risk of falls.  Baseline:  Goal status: IN PROGRESS    LONG TERM GOALS: Target date: 01/24/24  Patient will be independent with advanced/ongoing HEP to improve outcomes and carryover.  Baseline:  Goal status: INITIAL  2.  Patient will complete 5xSTS <20s without UE use Baseline: 27s with push off  Goal status: 12/24/23: ongoing, in progress  3.  Patient will demonstrate improved functional LE strength as demonstrated by 5/5 in BLE. Baseline:  Goal status: INITIAL  4.  Patient will demonstrate decreased fall risk by scoring < 15 sec on TUG. Baseline: 24s Goal status: INITIAL    PLAN:  PT FREQUENCY: 2x/week  PT DURATION: 10 weeks  PLANNED INTERVENTIONS: 97110-Therapeutic exercises, 97530- Therapeutic activity, [id number]- Neuromuscular re-education, 97535- Self Care, [id number]- Manual therapy, [phone]- Gait training, Patient/Family education, Balance training, Stair training, Cryotherapy, and Moist heat  PLAN FOR NEXT SESSION: LE and functional strengthening    Dorrian Doggett L Misheel Gowans, PT, DPT, OCS 12/24/2023, 4:02 PM

## 2023-12-25 ENCOUNTER — Ambulatory Visit: Admitting: *Deleted

## 2023-12-25 VITALS — BP 158/90 | HR 68 | Temp 97.7°F | Resp 16 | Ht 74.0 in | Wt 150.2 lb

## 2023-12-25 DIAGNOSIS — D539 Nutritional anemia, unspecified: Secondary | ICD-10-CM

## 2023-12-25 DIAGNOSIS — D509 Iron deficiency anemia, unspecified: Secondary | ICD-10-CM

## 2023-12-25 DIAGNOSIS — D631 Anemia in chronic kidney disease: Secondary | ICD-10-CM

## 2023-12-25 DIAGNOSIS — N189 Chronic kidney disease, unspecified: Secondary | ICD-10-CM

## 2023-12-25 MED ORDER — IRON SUCROSE 20 MG/ML IV SOLN
200.0000 mg | Freq: Once | INTRAVENOUS | Status: AC
  Administered 2023-12-25: 200 mg via INTRAVENOUS
  Filled 2023-12-25: qty 10

## 2023-12-25 MED ORDER — ACETAMINOPHEN 325 MG PO TABS
650.0000 mg | ORAL_TABLET | Freq: Once | ORAL | Status: AC
  Administered 2023-12-25: 650 mg via ORAL
  Filled 2023-12-25: qty 2

## 2023-12-25 MED ORDER — DIPHENHYDRAMINE HCL 25 MG PO CAPS
25.0000 mg | ORAL_CAPSULE | Freq: Once | ORAL | Status: AC
  Administered 2023-12-25: 25 mg via ORAL
  Filled 2023-12-25: qty 1

## 2023-12-25 NOTE — Progress Notes (Signed)
 Diagnosis: Iron  Deficiency Anemia  Provider:  Mannam, Praveen MD  Procedure: IV Push  IV Type: Peripheral, IV Location: R Antecubital  Venofer  (Iron  Sucrose), Dose: 200 mg  Post Infusion IV Care: Observation period completed and Peripheral IV Discontinued  Discharge: Condition: Good, Destination: Home . AVS Provided  Performed by:  Trudy Lamarr LABOR, RN

## 2023-12-26 ENCOUNTER — Ambulatory Visit

## 2023-12-26 ENCOUNTER — Other Ambulatory Visit: Payer: Self-pay

## 2023-12-26 DIAGNOSIS — M6281 Muscle weakness (generalized): Secondary | ICD-10-CM

## 2023-12-26 DIAGNOSIS — R2689 Other abnormalities of gait and mobility: Secondary | ICD-10-CM | POA: Diagnosis not present

## 2023-12-26 NOTE — Therapy (Signed)
 OUTPATIENT PHYSICAL THERAPY LOWER EXTREMITY TREATMENT   Patient Name: Juan Goodwin MRN: [id number] DOB:January 27, 1936, 88 y.o., male Today's Date: 12/26/2023  END OF SESSION:  PT End of Session - 12/26/23 1439     Visit Number 8    Date for Recertification  01/24/24    Authorization Type [employer]    Progress Note Due on Visit 10    PT Start Time 1430    PT Stop Time 1515    PT Time Calculation (min) 45 min    Activity Tolerance Patient tolerated treatment well    Behavior During Therapy [facility] for tasks assessed/performed                 Past Medical History:  Diagnosis Date   AAA (abdominal aortic aneurysm)    Acute pyloric channel ulcer 07/13/2014   Acute upper GI bleed 07/13/2014   Aortic atherosclerosis    Ascending aorta dilatation    4.3cm by echo 09/2022 and 3.9cm by Chest CT 03/2023.   Blood transfusion without reported diagnosis    CAD (coronary artery disease)    Cerebrovascular disease 10/08/2018   Chronic kidney disease, stage 3a (HCC) 03/30/2017   Dilated aortic root    38 mm by echo with 04/2021   Gastrointestinal hemorrhage with melena 06/2014   Global aphasia 03/27/2017   Helicobacter pylori gastritis 07/13/2014   High cholesterol    Hypernatremia 07/13/2014   Hypertension    Hypokalemia 07/13/2014   Normocytic normochromic anemia 04/10/2017   REM sleep behavior disorder 10/08/2018   Subdural hematoma (HCC) 03/26/2017   Thrombocytopenia 07/13/2014   Past Surgical History:  Procedure Laterality Date   BRAIN SURGERY     CRANIOTOMY Left 03/26/2017   Procedure: CRANIOTOMY HEMATOMA EVACUATION SUBDURAL;  Surgeon: Onetha Kuba, MD;  Location: [facility];  Service: Neurosurgery;  Laterality: Left;   ESOPHAGOGASTRODUODENOSCOPY N/A 07/11/2014   Procedure: ESOPHAGOGASTRODUODENOSCOPY (EGD);  Surgeon: Jerrell Sol, MD;  Location: [facility];  Service: Endoscopy;  Laterality: N/A;   LEFT HEART CATH AND CORONARY ANGIOGRAPHY N/A 04/26/2020   Procedure:  LEFT HEART CATH AND CORONARY ANGIOGRAPHY;  Surgeon: Dann Candyce RAMAN, MD;  Location[facility] INVAS[facility] CV LAB;  Service: Cardiovascular;  Laterality: N/A;   Patient Active Problem List   Diagnosis Date Noted   Deficiency anemia 11/06/2023   Sarcopenia 11/06/2023   Chronic constipation 11/06/2023   Aortic atherosclerosis    Ascending aorta dilatation    CAD (coronary artery disease) 10/12/2020   Angina at rest 10/11/2020   Elevated troponin    Chest pain 04/25/2020   NSTEMI (non-ST elevated myocardial infarction) (HCC) 04/25/2020   Hyperlipidemia 08/08/2019   Cerebrovascular disease 10/08/2018   REM sleep behavior disorder 10/08/2018   Weakness 04/10/2017   Dehydration 04/10/2017   Acute kidney injury superimposed on chronic kidney disease 04/10/2017   Normocytic normochromic anemia 04/10/2017   Chronic kidney disease (CKD), stage III (moderate) (HCC) 03/30/2017   HTN (hypertension), malignant 03/29/2017   Orthostatic hypotension 03/27/2017   Global aphasia 03/27/2017   Subdural hematoma (HCC) 03/26/2017   Acute pyloric channel ulcer 07/13/2014   Hypokalemia 07/13/2014   Acute upper GI bleed 07/13/2014   ARF (acute renal failure) 07/13/2014   Hypernatremia 07/13/2014   Hypotension 07/13/2014   Thrombocytopenia 07/13/2014   Helicobacter pylori gastritis 07/13/2014   Gastrointestinal hemorrhage with melena    Melena 07/11/2014   GI bleed 07/11/2014   Acute blood loss anemia 07/10/2014    PCP: Carlin Gull  REFERRING PROVIDER: Carlin Gull  REFERRING DIAG: R53.1 (ICD-10-CM) - Weakness   THERAPY DIAG:  Other abnormalities of gait and mobility  Muscle weakness (generalized)  Rationale for Evaluation and Treatment: Rehabilitation  ONSET DATE: 10/19/23 referral date  SUBJECTIVE:   SUBJECTIVE STATEMENT:feel better today than I did last time.  Daughter states pt to have some kind of overnight surgery for his abdomen next week.   Daughter reports his doctor sent him here  because he does not have a lot of physical activity a lot.   PERTINENT HISTORY: See above   PAIN:  Are you having pain? No  PRECAUTIONS: Fall  RED FLAGS: None   WEIGHT BEARING RESTRICTIONS: No  FALLS:  Has patient fallen in last 6 months? No  LIVING ENVIRONMENT: Lives with: lives with their daughter Lives in: House/apartment Stairs: No Has following equipment at home: Single point cane  OCCUPATION: Retired  PLOF: Independent with basic ADLs and Independent with gait  PATIENT GOALS: find out why they sent me here and do whatever I need to   NEXT MD VISIT:   OBJECTIVE:  Note: Objective measures were completed at Evaluation unless otherwise noted.  DIAGNOSTIC FINDINGS: N/A  COGNITION: Overall cognitive status: Within functional limits for tasks assessed and slower processing, daughter is here to fill in subjective pieces      SENSATION: WFL   POSTURE: rounded shoulders, forward head, and flexed trunk   LOWER EXTREMITY ROM: grossly WFL   LOWER EXTREMITY MMT:  MMT Right eval Left eval  Hip flexion 3 3-  Hip extension    Hip abduction 4 4  Hip adduction    Hip internal rotation    Hip external rotation    Knee flexion 4 4  Knee extension 4 3+  Ankle dorsiflexion    Ankle plantarflexion    Ankle inversion    Ankle eversion     (Blank rows = not tested)   FUNCTIONAL TESTS:  5 times sit to stand: 27s with push off  Timed up and go (TUG): 24s  GAIT: Distance walked: in clinic distances Assistive device utilized: Single point cane Level of assistance: Modified independence Comments: slowed speed, decreased foot clearance, flexed trunk                                                                                                                                TREATMENT DATE:  12/26/23:  Nustep L 5 x 6 min Ue's  and LE's  B knee flexion 25# 3 x 10 B knee ext 5# 3 x 10 Standing for horizontal arm swings with therabar, 3# 15 x Standing forward  punches with 3 # medibar 10 x Sit to stand from elevated mat with red mediball forward punches 5 x 2 Gait 4 laps, 400' with st cane and SBA Alt toe taps to target on 4 step 10 x each leg Forward step ups 4' step with B hand rails 10 x each leg  Seated rows with  blue t band 20x Standing alt hip abd 20 x    12/24/23:  BP L arm 115 /87, HR 103 Nustep L 5 x 6 min Ue's  and LE's  B knee flexion 25# 3 x 10 B knee ext 5# 3 x 10 Standing for horizontal arm swings with therabar, 2# 15 x Standing forward punches with 2 # medibar 10 x Sit to stand from elevated mat with red mediball forward punches 5 x 2 Gait 3 laps, 100' each with cane  Seated rows with blue t band 15 x Standing for hip abd, 5 x 2 each leg    12/12/23: Nustep L 5 x 6 min Standing therabar 3# forward shoulder presses 10 x 2 Standing therabar 3# horizontal shoulder swings in stanidng Gait 3 laps with cane, 300' Sit to stand from elevated mat with red mediball punches 10 x Forward taps to 4 step ,  B hands on rails 10 each Standing on airex or B heel raises in ll bars  Forward step ups onto 4 step 10 x each leg Seated for blue theraband rows 2 x 10 Standing at counter for alt hip abd  12/05/23 NuStep L5x71mins  TUG 19s Ball squeeze 2x10 STS 2x10 from elevated mat  HS curls 25# 2x10 Leg ext 5# 2x10 Seated rows green band 2x10 OHP with yellow ball 2x10 Chest press with yellow ball 2x10 Seated rotations with yellow ball 2x10  12/03/23:  Nustep Ue's and LE's 6 min level 5  Seated rows 15 # 20 reps Seated lat pulls 20# 15 reps B knee flexion 25#  B knee ext 5# 1 set 20, therapist assisted with terminal extension Gait x 400' with st cane, 4 laps Leg press 20# 15 x B Les and staggered position, 15 x each  Standing therabar 2# forward shoulder presses 10 x 2  11/28/23:  Nustep L 5 x 6 min Ue's and LE's Standing B heel raises 10 x 2  Seated rows 20# 15x Seated B knee ext 5# 10x  Seated B knee flexion 25# 2 x  10 Seated yellow theraband B shoulder ER 15 x Gait x 300', 3 laps with st cane  Leg press B  20 # 2 x 10 Standing forward punches with 2# therabar 10 x Standing alt high marches 12 x  11/26/23:  Nustep level 5 x 6 min Ue's and LE's B knee ext 5# 1 x 10 B knee flexion 25# 1 x 10  Standing high marches  Standing B heel raises B hand support Sit to stand from elevated mat, 10 x hands on knees  Seated rows, blue band, 15 reps Seated B shoulder ER yellow band 15 reps Standing forward punches with 2# therabar 10 reps   Gait with st cane 2 laps, 200'  Leg press 20# 10 x 1   11/15/23- EVAL, HEP    PATIENT EDUCATION:  Education details: HEP and POC Person educated: Patient and Child(ren) Education method: Medical illustrator Education comprehension: verbalized understanding, returned demonstration, verbal cues required, and tactile cues required  HOME EXERCISE PROGRAM: Access Code: [email or web address] URL: [email or web address] Date: 11/15/2023 Prepared by: Almetta Fam  Exercises - Sit to Stand with Armchair  - 1 x daily - 7 x weekly - 2 sets - 10 reps - Standing March with Counter Support  - 1 x daily - 7 x weekly - 2 sets - 10 reps - Standing Hip Abduction with Counter Support  - 1 x daily - 7 x weekly -  2 sets - 10 reps - Heel Raises with Counter Support  - 1 x daily - 7 x weekly - 2 sets - 10 reps - Seated Hip Abduction with Resistance  - 1 x daily - 7 x weekly - 2 sets - 10 reps - Seated March with Resistance  - 1 x daily - 7 x weekly - 2 sets - 10 reps  ASSESSMENT:  CLINICAL IMPRESSION:   Continued to focus session on bulk strengthening as well as endurance training. Less fatigue today.  Still struggles to move sit to stand.  Per his daughter hasn't eaten anything today . To have a procedure for aorta next week.  Should benefit from ongoing PT to address his stamina and efficiency with transitional movements .  Patient is a 88 y.o. male who was seen today for  physical therapy evaluation and treatment for weakness. He has some weakness in his lower extremities, especially with hip flexion. The LLE is weaker than the R. He has been ambulating with a cane for about a year. Patient lives with daughter, and she reports that he is sedentary and lack physical activity. He will benefit from skilled PT to address his strength deficits to improve functional independence and decrease his risk for falls. Patient is hard of hearing and has some delayed processing.   OBJECTIVE IMPAIRMENTS: Abnormal gait, decreased balance, decreased strength, and decreased safety awareness.   ACTIVITY LIMITATIONS: sitting, standing, squatting, stairs, and locomotion level  PERSONAL FACTORS: Age and Fitness are also affecting patient's functional outcome.   REHAB POTENTIAL: Good  CLINICAL DECISION MAKING: Stable/uncomplicated  EVALUATION COMPLEXITY: Low  GOALS: Goals reviewed with patient? Yes  SHORT TERM GOALS: Target date: 12/20/23  Patient will be independent with initial HEP. Baseline:  Goal status: 12/03/23: progressing  2.  Patient will demonstrate decreased fall risk by scoring < 20 sec on TUG. Baseline: 24s Goal status: MET 19s 12/05/23  3.  Patient will be educated on strategies to decrease risk of falls.  Baseline:  Goal status: IN PROGRESS    LONG TERM GOALS: Target date: 01/24/24  Patient will be independent with advanced/ongoing HEP to improve outcomes and carryover.  Baseline:  Goal status: INITIAL  2.  Patient will complete 5xSTS <20s without UE use Baseline: 27s with push off  Goal status: 12/24/23: ongoing, in progress  3.  Patient will demonstrate improved functional LE strength as demonstrated by 5/5 in BLE. Baseline:  Goal status: INITIAL  4.  Patient will demonstrate decreased fall risk by scoring < 15 sec on TUG. Baseline: 24s Goal status: INITIAL    PLAN:  PT FREQUENCY: 2x/week  PT DURATION: 10 weeks  PLANNED INTERVENTIONS:  [id number]-Therapeutic exercises, 97530- Therapeutic activity, [id number]- Neuromuscular re-education, 97535- Self Care, [id number]- Manual therapy, [phone]- Gait training, Patient/Family education, Balance training, Stair training, Cryotherapy, and Moist heat  PLAN FOR NEXT SESSION: LE and functional strengthening    Kaeleigh Westendorf L Shelva Hetzer, PT, DPT, OCS 12/26/2023, 3:16 PM

## 2023-12-27 ENCOUNTER — Ambulatory Visit

## 2023-12-27 VITALS — BP 146/89 | HR 76 | Temp 98.6°F | Resp 16 | Ht 74.0 in | Wt 151.2 lb

## 2023-12-27 DIAGNOSIS — D631 Anemia in chronic kidney disease: Secondary | ICD-10-CM

## 2023-12-27 DIAGNOSIS — N189 Chronic kidney disease, unspecified: Secondary | ICD-10-CM

## 2023-12-27 DIAGNOSIS — D539 Nutritional anemia, unspecified: Secondary | ICD-10-CM

## 2023-12-27 DIAGNOSIS — D509 Iron deficiency anemia, unspecified: Secondary | ICD-10-CM

## 2023-12-27 MED ORDER — ACETAMINOPHEN 325 MG PO TABS
650.0000 mg | ORAL_TABLET | Freq: Once | ORAL | Status: AC
  Administered 2023-12-27: 650 mg via ORAL
  Filled 2023-12-27: qty 2

## 2023-12-27 MED ORDER — IRON SUCROSE 20 MG/ML IV SOLN
200.0000 mg | Freq: Once | INTRAVENOUS | Status: AC
  Administered 2023-12-27: 200 mg via INTRAVENOUS
  Filled 2023-12-27: qty 10

## 2023-12-27 MED ORDER — DIPHENHYDRAMINE HCL 25 MG PO CAPS
25.0000 mg | ORAL_CAPSULE | Freq: Once | ORAL | Status: AC
  Administered 2023-12-27: 25 mg via ORAL
  Filled 2023-12-27: qty 1

## 2023-12-27 NOTE — Progress Notes (Signed)
 Diagnosis: Iron Deficiency Anemia  Provider:  Praveen Mannam MD  Procedure: IV Push  IV Type: Peripheral, IV Location: L Hand  Venofer (Iron Sucrose), Dose: 200 mg  Post Infusion IV Care: Observation period completed and Peripheral IV Discontinued  Discharge: Condition: Good, Destination: Home . AVS Provided  Performed by:  Waddell LOISE Freshwater, RN

## 2023-12-31 ENCOUNTER — Ambulatory Visit (INDEPENDENT_AMBULATORY_CARE_PROVIDER_SITE_OTHER): Admitting: Podiatry

## 2023-12-31 ENCOUNTER — Encounter: Payer: Self-pay | Admitting: Podiatry

## 2023-12-31 DIAGNOSIS — M79674 Pain in right toe(s): Secondary | ICD-10-CM

## 2023-12-31 DIAGNOSIS — B351 Tinea unguium: Secondary | ICD-10-CM

## 2023-12-31 DIAGNOSIS — D696 Thrombocytopenia, unspecified: Secondary | ICD-10-CM

## 2023-12-31 DIAGNOSIS — M79675 Pain in left toe(s): Secondary | ICD-10-CM

## 2023-12-31 NOTE — Progress Notes (Signed)
This patient returns to my office for at risk foot care.  This patient requires this care by a professional since this patient will be at risk due to having CKD and thrombocytopenia.  This patient is unable to cut nails himself since the patient cannot reach his nails.These nails are painful walking and wearing shoes. He presents to the office with his daughter.This patient presents for at risk foot care today.  General Appearance  Alert, conversant and in no acute stress.  Vascular  Dorsalis pedis and posterior tibial  pulses are palpable  bilaterally.  Capillary return is within normal limits  bilaterally. Temperature is within normal limits  bilaterally.  Neurologic  Senn-Weinstein monofilament wire test within normal limits  bilaterally. Muscle power within normal limits bilaterally.  Nails Thick disfigured discolored nails with subungual debris  from hallux to fifth toes bilaterally. No evidence of bacterial infection or drainage bilaterally.  Orthopedic  No limitations of motion  feet .  No crepitus or effusions noted.  No bony pathology or digital deformities noted.  Skin  normotropic skin with no porokeratosis noted bilaterally.  No signs of infections or ulcers noted.     Onychomycosis  Pain in right toes  Pain in left toes  Consent was obtained for treatment procedures.   Mechanical debridement of nails 1-5  bilaterally performed with a nail nipper.  Filed with dremel without incident.    Return office visit     3 months                 Told patient to return for periodic foot care and evaluation due to potential at risk complications.   Jasani Dolney DPM   

## 2023-12-31 NOTE — Pre-Procedure Instructions (Signed)
 Surgical Instructions   Your procedure is scheduled on January 04, 2024. Report to [facility] Main Entrance A at 8:00 A.M., then check in with the Admitting office. Any questions or running late day of surgery: call [phone]  Questions prior to your surgery date: call [phone], Monday-Friday, 8am-4pm. If you experience any cold or flu symptoms such as cough, fever, chills, shortness of breath, etc. between now and your scheduled surgery, please notify us  at the above number.     Remember:  Do not eat or drink after midnight the night before your surgery. No mints, gum or hard candy.    Take these medicines the morning of surgery with A SIP OF WATER: isosorbide  mononitrate (IMDUR )  metoprolol succinate (TOPROL XL)  rosuvastatin  (CRESTOR )  Aspirin   May take these medicines IF NEEDED: nitroGLYCERIN  (NITROSTAT ) 0.4 MG SL tablet    One week prior to surgery, STOP taking any Aleve, Naproxen, Ibuprofen, Motrin, Advil, Goody's, BC's, all herbal medications, fish oil, and non-prescription vitamins.                     Do NOT Smoke (Tobacco/Vaping) for 24 hours prior to your procedure.  If you use a CPAP at night, you may bring your mask/headgear for your overnight stay.   You will be asked to remove any contacts, glasses, piercing's, hearing aid's, dentures/partials prior to surgery. Please bring cases for these items if needed.    Patients discharged the day of surgery will not be allowed to drive home, and someone needs to stay with them for 24 hours.  SURGICAL WAITING ROOM VISITATION Patients may have no more than 2 support people in the waiting area - these visitors may rotate.   Pre-op nurse will coordinate an appropriate time for 1 ADULT support person, who may not rotate, to accompany patient in pre-op.  Children under the age of 86 must have an adult with them who is not the patient and must remain in the main waiting area with an adult.  If the patient needs to stay  at the hospital during part of their recovery, the visitor guidelines for inpatient rooms apply.  Please refer to the [facility] website for the visitor guidelines for any additional information.   If you received a COVID test during your pre-op visit  it is requested that you wear a mask when out in public, stay away from anyone that may not be feeling well and notify your surgeon if you develop symptoms. If you have been in contact with anyone that has tested positive in the last 10 days please notify you surgeon.      Pre-operative CHG Bathing Instructions   You can play a key role in reducing the risk of infection after surgery. Your skin needs to be as free of germs as possible. You can reduce the number of germs on your skin by washing with CHG (chlorhexidine gluconate) soap before surgery. CHG is an antiseptic soap that kills germs and continues to kill germs even after washing.   DO NOT use if you have an allergy to chlorhexidine/CHG or antibacterial soaps. If your skin becomes reddened or irritated, stop using the CHG and notify one of our RNs at [phone].              TAKE A SHOWER THE NIGHT BEFORE SURGERY   Please keep in mind the following:  DO NOT shave, including legs and underarms, 48 hours prior to surgery.   You may shave  your face before/day of surgery.  Place clean sheets on your bed the night before surgery Use a clean washcloth (not used since being washed) for shower. DO NOT sleep with pet's night before surgery.  CHG Shower Instructions:  Wash your face and private area with normal soap. If you choose to wash your hair, wash first with your normal shampoo.  After you use shampoo/soap, rinse your hair and body thoroughly to remove shampoo/soap residue.  Turn the water OFF and apply half the bottle of CHG soap to a CLEAN washcloth.  Apply CHG soap ONLY FROM YOUR NECK DOWN TO YOUR TOES (washing for 3-5 minutes)  DO NOT use CHG soap on face, private areas, open  wounds, or sores.  Pay special attention to the area where your surgery is being performed.  If you are having back surgery, having someone wash your back for you may be helpful. Wait 2 minutes after CHG soap is applied, then you may rinse off the CHG soap.  Pat dry with a clean towel  Put on clean pajamas    Additional instructions for the day of surgery: If you choose, you may shower the morning of surgery with an antibacterial soap.  DO NOT APPLY any lotions, deodorants, cologne, or perfumes.   Do not wear jewelry or makeup Do not wear nail polish, gel polish, artificial nails, or any other type of covering on natural nails (fingers and toes) Do not bring valuables to the hospital. [facility] is not responsible for valuables/personal belongings. Put on clean/comfortable clothes.  Please brush your teeth.  Ask your nurse before applying any prescription medications to the skin.

## 2024-01-01 ENCOUNTER — Ambulatory Visit

## 2024-01-01 ENCOUNTER — Other Ambulatory Visit: Payer: Self-pay

## 2024-01-01 ENCOUNTER — Encounter (HOSPITAL_COMMUNITY)
Admission: RE | Admit: 2024-01-01 | Discharge: 2024-01-01 | Disposition: A | Discharge status: Home / Self Care | Source: Ambulatory Visit | Attending: Vascular Surgery | Admitting: Vascular Surgery

## 2024-01-01 ENCOUNTER — Encounter (HOSPITAL_COMMUNITY): Payer: Self-pay

## 2024-01-01 VITALS — BP 154/105 | HR 71 | Temp 98.5°F | Resp 18 | Ht 73.0 in | Wt 149.0 lb

## 2024-01-01 DIAGNOSIS — I252 Old myocardial infarction: Secondary | ICD-10-CM | POA: Insufficient documentation

## 2024-01-01 DIAGNOSIS — I10 Essential (primary) hypertension: Secondary | ICD-10-CM | POA: Insufficient documentation

## 2024-01-01 DIAGNOSIS — E785 Hyperlipidemia, unspecified: Secondary | ICD-10-CM | POA: Insufficient documentation

## 2024-01-01 DIAGNOSIS — I7143 Infrarenal abdominal aortic aneurysm, without rupture: Secondary | ICD-10-CM

## 2024-01-01 DIAGNOSIS — M6281 Muscle weakness (generalized): Secondary | ICD-10-CM

## 2024-01-01 DIAGNOSIS — I2510 Atherosclerotic heart disease of native coronary artery without angina pectoris: Secondary | ICD-10-CM | POA: Insufficient documentation

## 2024-01-01 DIAGNOSIS — Z01818 Encounter for other preprocedural examination: Secondary | ICD-10-CM | POA: Insufficient documentation

## 2024-01-01 DIAGNOSIS — Z8673 Personal history of transient ischemic attack (TIA), and cerebral infarction without residual deficits: Secondary | ICD-10-CM | POA: Insufficient documentation

## 2024-01-01 DIAGNOSIS — I951 Orthostatic hypotension: Secondary | ICD-10-CM | POA: Insufficient documentation

## 2024-01-01 DIAGNOSIS — R2689 Other abnormalities of gait and mobility: Secondary | ICD-10-CM

## 2024-01-01 HISTORY — DX: Acute myocardial infarction, unspecified: I21.9

## 2024-01-01 LAB — COMPREHENSIVE METABOLIC PANEL WITH GFR
ALT: 15 U/L (ref 0–44)
AST: 27 U/L (ref 15–41)
Albumin: 3.2 g/dL — ABNORMAL LOW (ref 3.5–5.0)
Alkaline Phosphatase: 101 U/L (ref 38–126)
Anion gap: 8 (ref 5–15)
BUN: 16 mg/dL (ref 8–23)
CO2: 26 mmol/L (ref 22–32)
Calcium: 9 mg/dL (ref 8.9–10.3)
Chloride: 103 mmol/L (ref 98–111)
Creatinine, Ser: 1.33 mg/dL — ABNORMAL HIGH (ref 0.61–1.24)
GFR, Estimated: 52 mL/min — ABNORMAL LOW (ref >60)
Glucose, Bld: 133 mg/dL — ABNORMAL HIGH (ref 70–99)
Potassium: 3.2 mmol/L — ABNORMAL LOW (ref 3.5–5.1)
Sodium: 137 mmol/L (ref 135–145)
Total Bilirubin: 0.5 mg/dL (ref 0.0–1.2)
Total Protein: 7.9 g/dL (ref 6.5–8.1)

## 2024-01-01 LAB — CBC
HCT: 35.3 % — ABNORMAL LOW (ref 39.0–52.0)
Hemoglobin: 11.3 g/dL — ABNORMAL LOW (ref 13.0–17.0)
MCH: 25.9 pg — ABNORMAL LOW (ref 26.0–34.0)
MCHC: 32 g/dL (ref 30.0–36.0)
MCV: 81 fL (ref 80.0–100.0)
Platelets: 209 K/uL (ref 150–400)
RBC: 4.36 MIL/uL (ref 4.22–5.81)
RDW: 17.5 % — ABNORMAL HIGH (ref 11.5–15.5)
WBC: 4.6 K/uL (ref 4.0–10.5)
nRBC: 0 % (ref 0.0–0.2)

## 2024-01-01 LAB — PROTIME-INR
INR: 1 (ref 0.8–1.2)
Prothrombin Time: 14.2 s (ref 11.4–15.2)

## 2024-01-01 LAB — URINALYSIS, ROUTINE W REFLEX MICROSCOPIC
Bilirubin Urine: NEGATIVE
Glucose, UA: NEGATIVE mg/dL
Hgb urine dipstick: NEGATIVE
Ketones, ur: NEGATIVE mg/dL
Leukocytes,Ua: NEGATIVE
Nitrite: NEGATIVE
Protein, ur: NEGATIVE mg/dL
Specific Gravity, Urine: 1.009 (ref 1.005–1.030)
pH: 7 (ref 5.0–8.0)

## 2024-01-01 LAB — APTT: aPTT: 36 s (ref 24–36)

## 2024-01-01 LAB — SURGICAL PCR SCREEN
MRSA, PCR: NEGATIVE
Staphylococcus aureus: NEGATIVE

## 2024-01-01 NOTE — Therapy (Signed)
 OUTPATIENT PHYSICAL THERAPY LOWER EXTREMITY TREATMENT   Patient Name: Juan Goodwin MRN: [id number] DOB:25-Dec-1935, 88 y.o., male Today's Date: 01/01/2024  END OF SESSION:  PT End of Session - 01/01/24 1458     Visit Number 9    Date for Recertification  01/24/24    Authorization Type [employer]    Progress Note Due on Visit 10    PT Start Time 1500    PT Stop Time 1545    PT Time Calculation (min) 45 min    Activity Tolerance Patient tolerated treatment well    Behavior During Therapy [facility] for tasks assessed/performed                  Past Medical History:  Diagnosis Date   AAA (abdominal aortic aneurysm)    Acute pyloric channel ulcer 07/13/2014   Acute upper GI bleed 07/13/2014   Aortic atherosclerosis    Ascending aorta dilatation    4.3cm by echo 09/2022 and 3.9cm by Chest CT 03/2023.   Blood transfusion without reported diagnosis    CAD (coronary artery disease)    Cerebrovascular disease 10/08/2018   Chronic kidney disease, stage 3a (HCC) 03/30/2017   Dilated aortic root    38 mm by echo with 04/2021   Gastrointestinal hemorrhage with melena 06/2014   Global aphasia 03/27/2017   Helicobacter pylori gastritis 07/13/2014   High cholesterol    Hypernatremia 07/13/2014   Hypertension    Hypokalemia 07/13/2014   Myocardial infarction [facility])    mild heart attack in 2022   Normocytic normochromic anemia 04/10/2017   REM sleep behavior disorder 10/08/2018   Subdural hematoma (HCC) 03/26/2017   Thrombocytopenia 07/13/2014   Past Surgical History:  Procedure Laterality Date   BRAIN SURGERY     CRANIOTOMY Left 03/26/2017   Procedure: CRANIOTOMY HEMATOMA EVACUATION SUBDURAL;  Surgeon: Onetha Kuba, MD;  Location: [facility] OR;  Service: Neurosurgery;  Laterality: Left;   ESOPHAGOGASTRODUODENOSCOPY N/A 07/11/2014   Procedure: ESOPHAGOGASTRODUODENOSCOPY (EGD);  Surgeon: Jerrell Sol, MD;  Location: [facility];  Service: Endoscopy;  Laterality: N/A;   LEFT  HEART CATH AND CORONARY ANGIOGRAPHY N/A 04/26/2020   Procedure: LEFT HEART CATH AND CORONARY ANGIOGRAPHY;  Surgeon: Dann Candyce RAMAN, MD;  Location: [facility] INVASIVE CV LAB;  Service: Cardiovascular;  Laterality: N/A;   Patient Active Problem List   Diagnosis Date Noted   Deficiency anemia 11/06/2023   Sarcopenia 11/06/2023   Chronic constipation 11/06/2023   Aortic atherosclerosis    Ascending aorta dilatation    CAD (coronary artery disease) 10/12/2020   Angina at rest 10/11/2020   Elevated troponin    Chest pain 04/25/2020   NSTEMI (non-ST elevated myocardial infarction) (HCC) 04/25/2020   Hyperlipidemia 08/08/2019   Cerebrovascular disease 10/08/2018   REM sleep behavior disorder 10/08/2018   Weakness 04/10/2017   Dehydration 04/10/2017   Acute kidney injury superimposed on chronic kidney disease 04/10/2017   Normocytic normochromic anemia 04/10/2017   Chronic kidney disease (CKD), stage III (moderate) (HCC) 03/30/2017   HTN (hypertension), malignant 03/29/2017   Orthostatic hypotension 03/27/2017   Global aphasia 03/27/2017   Subdural hematoma (HCC) 03/26/2017   Acute pyloric channel ulcer 07/13/2014   Hypokalemia 07/13/2014   Acute upper GI bleed 07/13/2014   ARF (acute renal failure) 07/13/2014   Hypernatremia 07/13/2014   Hypotension 07/13/2014   Thrombocytopenia 07/13/2014   Helicobacter pylori gastritis 07/13/2014   Gastrointestinal hemorrhage with melena    Melena 07/11/2014   GI bleed 07/11/2014   Acute blood loss  anemia 07/10/2014    PCP: Carlin Gull  REFERRING PROVIDER: Carlin Gull  REFERRING DIAG: R53.1 (ICD-10-CM) - Weakness   THERAPY DIAG:  Other abnormalities of gait and mobility  Muscle weakness (generalized)  Rationale for Evaluation and Treatment: Rehabilitation  ONSET DATE: 10/19/23 referral date  SUBJECTIVE:   SUBJECTIVE STATEMENT: I am alright.   Daughter reports his doctor sent him here because he does not have a lot of physical  activity a lot.   PERTINENT HISTORY: See above   PAIN:  Are you having pain? No  PRECAUTIONS: Fall  RED FLAGS: None   WEIGHT BEARING RESTRICTIONS: No  FALLS:  Has patient fallen in last 6 months? No  LIVING ENVIRONMENT: Lives with: lives with their daughter Lives in: House/apartment Stairs: No Has following equipment at home: Single point cane  OCCUPATION: Retired  PLOF: Independent with basic ADLs and Independent with gait  PATIENT GOALS: find out why they sent me here and do whatever I need to   NEXT MD VISIT:   OBJECTIVE:  Note: Objective measures were completed at Evaluation unless otherwise noted.  DIAGNOSTIC FINDINGS: N/A  COGNITION: Overall cognitive status: Within functional limits for tasks assessed and slower processing, daughter is here to fill in subjective pieces      SENSATION: WFL   POSTURE: rounded shoulders, forward head, and flexed trunk   LOWER EXTREMITY ROM: grossly WFL   LOWER EXTREMITY MMT:  MMT Right eval Left eval  Hip flexion 3 3-  Hip extension    Hip abduction 4 4  Hip adduction    Hip internal rotation    Hip external rotation    Knee flexion 4 4  Knee extension 4 3+  Ankle dorsiflexion    Ankle plantarflexion    Ankle inversion    Ankle eversion     (Blank rows = not tested)   FUNCTIONAL TESTS:  5 times sit to stand: 27s with push off  Timed up and go (TUG): 24s  GAIT: Distance walked: in clinic distances Assistive device utilized: Single point cane Level of assistance: Modified independence Comments: slowed speed, decreased foot clearance, flexed trunk                                                                                                                                TREATMENT DATE:  01/01/24 Leg ext 5# 2x10 HS curls 25# 2x10 NuStep L5x57mins STS 4x5 from elevated mat table Step ups 4 Walking laps with cane 3 big laps  12/26/23:  Nustep L 5 x 6 min Ue's  and LE's  B knee flexion 25# 3  x 10 B knee ext 5# 3 x 10 Standing for horizontal arm swings with therabar, 3# 15 x Standing forward punches with 3 # medibar 10 x Sit to stand from elevated mat with red mediball forward punches 5 x 2 Gait 4 laps, 400' with st cane and SBA Alt toe taps to target on 4  step 10 x each leg Forward step ups 4' step with B hand rails 10 x each leg  Seated rows with blue t band 20x Standing alt hip abd 20 x    12/24/23:  BP L arm 115 /87, HR 103 Nustep L 5 x 6 min Ue's  and LE's  B knee flexion 25# 3 x 10 B knee ext 5# 3 x 10 Standing for horizontal arm swings with therabar, 2# 15 x Standing forward punches with 2 # medibar 10 x Sit to stand from elevated mat with red mediball forward punches 5 x 2 Gait 3 laps, 100' each with cane  Seated rows with blue t band 15 x Standing for hip abd, 5 x 2 each leg    12/12/23: Nustep L 5 x 6 min Standing therabar 3# forward shoulder presses 10 x 2 Standing therabar 3# horizontal shoulder swings in stanidng Gait 3 laps with cane, 300' Sit to stand from elevated mat with red mediball punches 10 x Forward taps to 4 step ,  B hands on rails 10 each Standing on airex or B heel raises in ll bars  Forward step ups onto 4 step 10 x each leg Seated for blue theraband rows 2 x 10 Standing at counter for alt hip abd  12/05/23 NuStep L5x37mins  TUG 19s Ball squeeze 2x10 STS 2x10 from elevated mat  HS curls 25# 2x10 Leg ext 5# 2x10 Seated rows green band 2x10 OHP with yellow ball 2x10 Chest press with yellow ball 2x10 Seated rotations with yellow ball 2x10  12/03/23:  Nustep Ue's and LE's 6 min level 5  Seated rows 15 # 20 reps Seated lat pulls 20# 15 reps B knee flexion 25#  B knee ext 5# 1 set 20, therapist assisted with terminal extension Gait x 400' with st cane, 4 laps Leg press 20# 15 x B Les and staggered position, 15 x each  Standing therabar 2# forward shoulder presses 10 x 2  11/28/23:  Nustep L 5 x 6 min Ue's and LE's Standing  B heel raises 10 x 2  Seated rows 20# 15x Seated B knee ext 5# 10x  Seated B knee flexion 25# 2 x 10 Seated yellow theraband B shoulder ER 15 x Gait x 300', 3 laps with st cane  Leg press B  20 # 2 x 10 Standing forward punches with 2# therabar 10 x Standing alt high marches 12 x  11/26/23:  Nustep level 5 x 6 min Ue's and LE's B knee ext 5# 1 x 10 B knee flexion 25# 1 x 10  Standing high marches  Standing B heel raises B hand support Sit to stand from elevated mat, 10 x hands on knees  Seated rows, blue band, 15 reps Seated B shoulder ER yellow band 15 reps Standing forward punches with 2# therabar 10 reps   Gait with st cane 2 laps, 200'  Leg press 20# 10 x 1   11/15/23- EVAL, HEP    PATIENT EDUCATION:  Education details: HEP and POC Person educated: Patient and Child(ren) Education method: Medical illustrator Education comprehension: verbalized understanding, returned demonstration, verbal cues required, and tactile cues required  HOME EXERCISE PROGRAM: Access Code: [email or web address] URL: [email or web address] Date: 11/15/2023 Prepared by: Almetta Fam  Exercises - Sit to Stand with Armchair  - 1 x daily - 7 x weekly - 2 sets - 10 reps - Standing March with Counter Support  - 1 x daily - 7 x weekly -  2 sets - 10 reps - Standing Hip Abduction with Counter Support  - 1 x daily - 7 x weekly - 2 sets - 10 reps - Heel Raises with Counter Support  - 1 x daily - 7 x weekly - 2 sets - 10 reps - Seated Hip Abduction with Resistance  - 1 x daily - 7 x weekly - 2 sets - 10 reps - Seated March with Resistance  - 1 x daily - 7 x weekly - 2 sets - 10 reps  ASSESSMENT:  CLINICAL IMPRESSION:   Continued to focus session on bulk strengthening as well as endurance training. STS are still the most difficult for him, even from higher surfaces. Less fatigue today but does state he is tired after step ups. He has a procedure for his aorta scheduled this Friday. Should benefit  from ongoing PT to address his stamina and efficiency with transitional movements .  Patient is a 88 y.o. male who was seen today for physical therapy evaluation and treatment for weakness. He has some weakness in his lower extremities, especially with hip flexion. The LLE is weaker than the R. He has been ambulating with a cane for about a year. Patient lives with daughter, and she reports that he is sedentary and lack physical activity. He will benefit from skilled PT to address his strength deficits to improve functional independence and decrease his risk for falls. Patient is hard of hearing and has some delayed processing.   OBJECTIVE IMPAIRMENTS: Abnormal gait, decreased balance, decreased strength, and decreased safety awareness.   ACTIVITY LIMITATIONS: sitting, standing, squatting, stairs, and locomotion level  PERSONAL FACTORS: Age and Fitness are also affecting patient's functional outcome.   REHAB POTENTIAL: Good  CLINICAL DECISION MAKING: Stable/uncomplicated  EVALUATION COMPLEXITY: Low  GOALS: Goals reviewed with patient? Yes  SHORT TERM GOALS: Target date: 12/20/23  Patient will be independent with initial HEP. Baseline:  Goal status: 12/03/23: progressing  2.  Patient will demonstrate decreased fall risk by scoring < 20 sec on TUG. Baseline: 24s Goal status: MET 19s 12/05/23  3.  Patient will be educated on strategies to decrease risk of falls.  Baseline:  Goal status: IN PROGRESS    LONG TERM GOALS: Target date: 01/24/24  Patient will be independent with advanced/ongoing HEP to improve outcomes and carryover.  Baseline:  Goal status: INITIAL  2.  Patient will complete 5xSTS <20s without UE use Baseline: 27s with push off  Goal status: 12/24/23: ongoing, in progress  3.  Patient will demonstrate improved functional LE strength as demonstrated by 5/5 in BLE. Baseline:  Goal status: INITIAL  4.  Patient will demonstrate decreased fall risk by scoring < 15 sec  on TUG. Baseline: 24s Goal status: INITIAL    PLAN:  PT FREQUENCY: 2x/week  PT DURATION: 10 weeks  PLANNED INTERVENTIONS: [id number]-Therapeutic exercises, 97530- Therapeutic activity, [id number]- Neuromuscular re-education, 97535- Self Care, [id number]- Manual therapy, [phone]- Gait training, Patient/Family education, Balance training, Stair training, Cryotherapy, and Moist heat  PLAN FOR NEXT SESSION: LE and functional strengthening, 10th visit PN   Almetta Fam, PT, DPT 01/01/2024, 3:40 PM

## 2024-01-01 NOTE — Progress Notes (Signed)
 PCP - [name], MD  Cardiologist - Dr Wilbert Bihari   PPM/ICD - denies   Chest x-ray - N/A EKG - 12/11/23 Stress Test - 10/12/20 ECHO - 10/02/22 Cardiac Cath - 04/26/20  Sleep Study - denies   Fasting Blood Sugar - N/A  Last dose of GLP1 agonist-  N/A    Blood Thinner Instructions: N/A Aspirin  Instructions:Continue ASA per surgeon  ERAS Protcol - NPO order   COVID TEST- N/A   Anesthesia review: yes, heart history. BP elevated at [facility] (143/105, 154/105)T. Pt reports that he woke up early, did not eat and has not taken his medications today. Pt asymptomatic. Pt and daughter educated that patient should take his medications when he gets home. Cardiac clearance 12/11/23 in epic.   Patient denies shortness of breath, fever, cough and chest pain at PAT appointment   All instructions explained to the patient, with a verbal understanding of the material. Patient agrees to go over the instructions while at home for a better understanding. The opportunity to ask questions was provided.

## 2024-01-02 ENCOUNTER — Ambulatory Visit

## 2024-01-02 VITALS — BP 151/86 | HR 65 | Temp 97.7°F | Resp 18 | Ht 73.0 in | Wt 150.2 lb

## 2024-01-02 DIAGNOSIS — D539 Nutritional anemia, unspecified: Secondary | ICD-10-CM

## 2024-01-02 DIAGNOSIS — D509 Iron deficiency anemia, unspecified: Secondary | ICD-10-CM | POA: Diagnosis not present

## 2024-01-02 MED ORDER — IRON SUCROSE 20 MG/ML IV SOLN
200.0000 mg | Freq: Once | INTRAVENOUS | Status: AC
  Administered 2024-01-02: 200 mg via INTRAVENOUS
  Filled 2024-01-02: qty 10

## 2024-01-02 MED ORDER — DIPHENHYDRAMINE HCL 25 MG PO CAPS
25.0000 mg | ORAL_CAPSULE | Freq: Once | ORAL | Status: AC
  Administered 2024-01-02: 25 mg via ORAL
  Filled 2024-01-02: qty 1

## 2024-01-02 MED ORDER — ACETAMINOPHEN 325 MG PO TABS
650.0000 mg | ORAL_TABLET | Freq: Once | ORAL | Status: AC
  Administered 2024-01-02: 650 mg via ORAL
  Filled 2024-01-02: qty 2

## 2024-01-02 NOTE — Progress Notes (Signed)
 Anesthesia Chart Review:  88 year old male follows with cardiology for history of CVA, HTN, HLD, mildly dilated ascending aorta, orthostatic hypotension, CAD s/p NSTEMI 04/2020 in setting of hypertensive urgency with LHC with diffuse small vessel disease in the distal PDA 75%, small OM1 75%, OM2 99% branches and 50% P D1 which were not amenable to PCI and recommended medical management.  Myoview  10/2020 with no significant ischemia.Echo 09/2022 normal LVEF, severe LVH, ascending arota dialtion 4.3 cm. Subsequent cardiac PET 12/2022 with LVEF 56%, no ischemia, low risk. CT chest 04/06/23 with ascending aorta measurment 3.9 cm. Abdominal aortic aneurysm measuring 7.5 cm incidentally finding on CT 11/2023. By CTA 11/29/23 renal arteries patent, normal adrenal gland.  Patient seen by Reche Finder, NP on 12/11/2023 for preop evaluation.  Per note, Preop / AAA - EVAR pending with Dr. Lanis for 7.5cm infrarenal abdominal aneurysm. According to the Revised Cardiac Risk Index (RCRI), his Perioperative Risk of Major Cardiac Event is (%): 11. Based on his activity at PT twice per week and home exercises capacity >4METS.  Cardiac PET 12/2022 - low risk. Per AHA/ACC guidelines, he is deemed acceptable risk for the planned procedure without additional cardiovascular testing. Will route to surgical team so they are aware.  May hold Aspirin  7 days prior to procedure if necessary.  Other pertinent history includes former smoker, anemia, CKD 3A, GI bleed.  Preop labs reviewed, mild hypokalemia potassium 3.2, creatinine mildly elevated 1.33, mild hypoalbuminemia albumin  3.2, mild chronic anemia hemoglobin 11.3, otherwise unremarkable.  EKG 12/11/2023: Normal sinus rhythm.  Rate 75. Left anterior fascicular block  Cardiac PET CT 01/09/2023:   The study is normal. The study is low risk.   LV perfusion is normal.   Rest left ventricular function is normal. Rest EF: 56%. Stress left ventricular function is normal. Stress EF:  59%. End diastolic cavity size is normal. End systolic cavity size is normal.   Myocardial blood flow was computed to be 0.85ml/g/min at rest and 2.30ml/g/min at stress. Global myocardial blood flow reserve was 2.37 and was normal.   Electronically signed by Jerel Balding, MD  TTE 10/02/2022: 1. Left ventricular ejection fraction, by estimation, is 60 to 65%. The  left ventricle has normal function. The left ventricle has no regional  wall motion abnormalities. There is severe asymmetric left ventricular  hypertrophy. Left ventricular diastolic   parameters are consistent with Grade I diastolic dysfunction (impaired  relaxation).   2. Right ventricular systolic function is normal. The right ventricular  size is normal. Moderately increased right ventricular wall thickness.   3. The mitral valve is degenerative. No evidence of mitral valve  regurgitation.   4. The aortic valve was not well visualized. Aortic valve regurgitation  is trivial. No aortic stenosis is present.   5. Aortic dilatation noted. There is mild dilatation of the ascending  aorta, measuring 43 mm.   Comparison(s): Prior images reviewed side by side. Acending diameter  increased from prior; technically difficult study.   Conclusion(s)/Recommendation(s): In the setting of biventricular  hypertrophy, nuclear scintigraphy imaging (PYP) may be considered.       Lynwood Geofm RIGGERS [facility] Short Stay Center/Anesthesiology Phone [phone] 01/02/2024 12:40 PM

## 2024-01-02 NOTE — Progress Notes (Signed)
 Diagnosis: Iron Deficiency Anemia  Provider:  Chilton Greathouse MD  Procedure: IV Push  IV Type: Peripheral, IV Location: L Forearm  Venofer (Iron Sucrose), Dose: 200 mg  Post Infusion IV Care: Observation period completed and Peripheral IV Discontinued  Discharge: Condition: Good, Destination: Home . AVS Provided  Performed by:  Garnette Czech, RN

## 2024-01-02 NOTE — Anesthesia Preprocedure Evaluation (Signed)
 Anesthesia Evaluation  Patient identified by MRN, date of birth, ID band Patient awake    Reviewed: Allergy & Precautions, NPO status , Patient's Chart, lab work & pertinent test results  Airway Mallampati: II  TM Distance: >3 FB Neck ROM: Full    Dental  (+) Teeth Intact, Dental Advisory Given   Pulmonary former smoker   breath sounds clear to auscultation       Cardiovascular hypertension, Pt. on home beta blockers + CAD and + Past MI   Rhythm:Regular Rate:Normal  Echo:   1. Left ventricular ejection fraction, by estimation, is 60 to 65%. The  left ventricle has normal function. The left ventricle has no regional  wall motion abnormalities. There is severe asymmetric left ventricular  hypertrophy. Left ventricular diastolic   parameters are consistent with Grade I diastolic dysfunction (impaired  relaxation).   2. Right ventricular systolic function is normal. The right ventricular  size is normal. Moderately increased right ventricular wall thickness.   3. The mitral valve is degenerative. No evidence of mitral valve  regurgitation.   4. The aortic valve was not well visualized. Aortic valve regurgitation  is trivial. No aortic stenosis is present.   5. Aortic dilatation noted. There is mild dilatation of the ascending  aorta, measuring 43 mm.     Neuro/Psych  Neuromuscular disease  negative psych ROS   GI/Hepatic Neg liver ROS, PUD,,,  Endo/Other  negative endocrine ROS    Renal/GU Renal disease     Musculoskeletal negative musculoskeletal ROS (+)    Abdominal   Peds  Hematology  (+) Blood dyscrasia, anemia   Anesthesia Other Findings   Reproductive/Obstetrics                              Anesthesia Physical Anesthesia Plan  ASA: 4  Anesthesia Plan: General   Post-op Pain Management: Ofirmev  IV (intra-op)*   Induction: Intravenous  PONV Risk Score and Plan: 3 and  Ondansetron , Dexamethasone  and Midazolam   Airway Management Planned: Oral ETT  Additional Equipment: Arterial line  Intra-op Plan:   Post-operative Plan: Extubation in OR  Informed Consent: I have reviewed the patients History and Physical, chart, labs and discussed the procedure including the risks, benefits and alternatives for the proposed anesthesia with the patient or authorized representative who has indicated his/her understanding and acceptance.     Dental advisory given  Plan Discussed with: CRNA  Anesthesia Plan Comments: (PAT note by Lynwood Hope, PA-C: 88 year old male follows with cardiology for history of CVA, HTN, HLD, mildly dilated ascending aorta, orthostatic hypotension, CAD s/p NSTEMI 04/2020 in setting of hypertensive urgency with LHC with diffuse small vessel disease in the distal PDA 75%, small OM1 75%, OM2 99% branches and 50% P D1 which were not amenable to PCI and recommended medical management.  Myoview  10/2020 with no significant ischemia.Echo 09/2022 normal LVEF, severe LVH, ascending arota dialtion 4.3 cm. Subsequent cardiac PET 12/2022 with LVEF 56%, no ischemia, low risk. CT chest 04/06/23 with ascending aorta measurment 3.9 cm. Abdominal aortic aneurysm measuring 7.5 cm incidentally finding on CT 11/2023. By CTA 11/29/23 renal arteries patent, normal adrenal gland.  Patient seen by Reche Finder, NP on 12/11/2023 for preop evaluation.  Per note, Preop / AAA - EVAR pending with Dr. Lanis for 7.5cm infrarenal abdominal aneurysm. According to the Revised Cardiac Risk Index (RCRI), his Perioperative Risk of Major Cardiac Event is (%): 11. Based on his activity at PT  twice per week and home exercises capacity >4METS.  Cardiac PET 12/2022 - low risk. Per AHA/ACC guidelines, he is deemed acceptable risk for the planned procedure without additional cardiovascular testing. Will route to surgical team so they are aware.  May hold Aspirin  7 days prior to procedure if  necessary.  Other pertinent history includes former smoker, anemia, CKD 3A, GI bleed.  Preop labs reviewed, mild hypokalemia potassium 3.2, creatinine mildly elevated 1.33, mild hypoalbuminemia albumin  3.2, mild chronic anemia hemoglobin 11.3, otherwise unremarkable.  EKG 12/11/2023: Normal sinus rhythm.  Rate 75. Left anterior fascicular block  Cardiac PET CT 01/09/2023:   The study is normal. The study is low risk.   LV perfusion is normal.   Rest left ventricular function is normal. Rest EF: 56%. Stress left ventricular function is normal. Stress EF: 59%. End diastolic cavity size is normal. End systolic cavity size is normal.   Myocardial blood flow was computed to be 0.78ml/g/min at rest and 2.30ml/g/min at stress. Global myocardial blood flow reserve was 2.37 and was normal.   Electronically signed by Jerel Balding, MD  TTE 10/02/2022: 1. Left ventricular ejection fraction, by estimation, is 60 to 65%. The  left ventricle has normal function. The left ventricle has no regional  wall motion abnormalities. There is severe asymmetric left ventricular  hypertrophy. Left ventricular diastolic  parameters are consistent with Grade I diastolic dysfunction (impaired  relaxation).  2. Right ventricular systolic function is normal. The right ventricular  size is normal. Moderately increased right ventricular wall thickness.  3. The mitral valve is degenerative. No evidence of mitral valve  regurgitation.  4. The aortic valve was not well visualized. Aortic valve regurgitation  is trivial. No aortic stenosis is present.  5. Aortic dilatation noted. There is mild dilatation of the ascending  aorta, measuring 43 mm.   Comparison(s): Prior images reviewed side by side. Acending diameter  increased from prior; technically difficult study.   Conclusion(s)/Recommendation(s): In the setting of biventricular  hypertrophy, nuclear scintigraphy imaging (PYP) may be considered.     )          Anesthesia Quick Evaluation

## 2024-01-03 ENCOUNTER — Ambulatory Visit

## 2024-01-03 DIAGNOSIS — R2689 Other abnormalities of gait and mobility: Secondary | ICD-10-CM

## 2024-01-03 DIAGNOSIS — M6281 Muscle weakness (generalized): Secondary | ICD-10-CM

## 2024-01-03 NOTE — Progress Notes (Signed)
 Pt's daughter aware of new arrival time 44.

## 2024-01-03 NOTE — Therapy (Addendum)
 OUTPATIENT PHYSICAL THERAPY LOWER EXTREMITY TREATMENT   Patient Name: Juan Goodwin MRN: [id number] DOB:Oct 05, 1935, 88 y.o., male Today's Date: 01/03/2024  END OF SESSION:  PT End of Session - 01/03/24 1457     Visit Number 10    Date for Recertification  01/24/24    Authorization Type [employer]    Progress Note Due on Visit 10    PT Start Time 1500    PT Stop Time 1545    PT Time Calculation (min) 45 min    Activity Tolerance Patient tolerated treatment well    Behavior During Therapy [facility] for tasks assessed/performed                   Past Medical History:  Diagnosis Date   AAA (abdominal aortic aneurysm)    Acute pyloric channel ulcer 07/13/2014   Acute upper GI bleed 07/13/2014   Aortic atherosclerosis    Ascending aorta dilatation    4.3cm by echo 09/2022 and 3.9cm by Chest CT 03/2023.   Blood transfusion without reported diagnosis    CAD (coronary artery disease)    Cerebrovascular disease 10/08/2018   Chronic kidney disease, stage 3a (HCC) 03/30/2017   Dilated aortic root    38 mm by echo with 04/2021   Gastrointestinal hemorrhage with melena 06/2014   Global aphasia 03/27/2017   Helicobacter pylori gastritis 07/13/2014   High cholesterol    Hypernatremia 07/13/2014   Hypertension    Hypokalemia 07/13/2014   Myocardial infarction [facility])    mild heart attack in 2022   Normocytic normochromic anemia 04/10/2017   REM sleep behavior disorder 10/08/2018   Subdural hematoma (HCC) 03/26/2017   Thrombocytopenia 07/13/2014   Past Surgical History:  Procedure Laterality Date   BRAIN SURGERY     CRANIOTOMY Left 03/26/2017   Procedure: CRANIOTOMY HEMATOMA EVACUATION SUBDURAL;  Surgeon: Onetha Kuba, MD;  Location: [facility] OR;  Service: Neurosurgery;  Laterality: Left;   ESOPHAGOGASTRODUODENOSCOPY N/A 07/11/2014   Procedure: ESOPHAGOGASTRODUODENOSCOPY (EGD);  Surgeon: Jerrell Sol, MD;  Location: [facility];  Service: Endoscopy;  Laterality: N/A;    LEFT HEART CATH AND CORONARY ANGIOGRAPHY N/A 04/26/2020   Procedure: LEFT HEART CATH AND CORONARY ANGIOGRAPHY;  Surgeon: Dann Candyce RAMAN, MD;  Location: [facility] INVASIVE CV LAB;  Service: Cardiovascular;  Laterality: N/A;   Patient Active Problem List   Diagnosis Date Noted   Deficiency anemia 11/06/2023   Sarcopenia 11/06/2023   Chronic constipation 11/06/2023   Aortic atherosclerosis    Ascending aorta dilatation    CAD (coronary artery disease) 10/12/2020   Angina at rest 10/11/2020   Elevated troponin    Chest pain 04/25/2020   NSTEMI (non-ST elevated myocardial infarction) (HCC) 04/25/2020   Hyperlipidemia 08/08/2019   Cerebrovascular disease 10/08/2018   REM sleep behavior disorder 10/08/2018   Weakness 04/10/2017   Dehydration 04/10/2017   Acute kidney injury superimposed on chronic kidney disease 04/10/2017   Normocytic normochromic anemia 04/10/2017   Chronic kidney disease (CKD), stage III (moderate) (HCC) 03/30/2017   HTN (hypertension), malignant 03/29/2017   Orthostatic hypotension 03/27/2017   Global aphasia 03/27/2017   Subdural hematoma (HCC) 03/26/2017   Acute pyloric channel ulcer 07/13/2014   Hypokalemia 07/13/2014   Acute upper GI bleed 07/13/2014   ARF (acute renal failure) 07/13/2014   Hypernatremia 07/13/2014   Hypotension 07/13/2014   Thrombocytopenia 07/13/2014   Helicobacter pylori gastritis 07/13/2014   Gastrointestinal hemorrhage with melena    Melena 07/11/2014   GI bleed 07/11/2014   Acute blood  loss anemia 07/10/2014    PCP: Carlin Gull  REFERRING PROVIDER: Carlin Gull  REFERRING DIAG: R53.1 (ICD-10-CM) - Weakness   THERAPY DIAG:  Muscle weakness (generalized)  Other abnormalities of gait and mobility  Rationale for Evaluation and Treatment: Rehabilitation  ONSET DATE: 10/19/23 referral date  SUBJECTIVE:   SUBJECTIVE STATEMENT: I little tired.    PERTINENT HISTORY: See above   PAIN:  Are you having pain? No  PRECAUTIONS:  Fall  RED FLAGS: None   WEIGHT BEARING RESTRICTIONS: No  FALLS:  Has patient fallen in last 6 months? No  LIVING ENVIRONMENT: Lives with: lives with their daughter Lives in: House/apartment Stairs: No Has following equipment at home: Single point cane  OCCUPATION: Retired  PLOF: Independent with basic ADLs and Independent with gait  PATIENT GOALS: find out why they sent me here and do whatever I need to   NEXT MD VISIT:   OBJECTIVE:  Note: Objective measures were completed at Evaluation unless otherwise noted.  DIAGNOSTIC FINDINGS: N/A  COGNITION: Overall cognitive status: Within functional limits for tasks assessed and slower processing, daughter is here to fill in subjective pieces      SENSATION: WFL   POSTURE: rounded shoulders, forward head, and flexed trunk   LOWER EXTREMITY ROM: grossly WFL   LOWER EXTREMITY MMT:  MMT Right eval Left eval Right  01/03/24 Left 01/03/24  Hip flexion 3 3- 4 4-  Hip extension      Hip abduction 4 4 4+ 4+  Hip adduction      Hip internal rotation      Hip external rotation      Knee flexion 4 4 4+ 4+  Knee extension 4 3+ 4+ 4  Ankle dorsiflexion      Ankle plantarflexion      Ankle inversion      Ankle eversion       (Blank rows = not tested)   FUNCTIONAL TESTS:  5 times sit to stand: 27s with push off  Timed up and go (TUG): 24s  GAIT: Distance walked: in clinic distances Assistive device utilized: Single point cane Level of assistance: Modified independence Comments: slowed speed, decreased foot clearance, flexed trunk                                                                                                                                TREATMENT DATE:  01/03/24 Recheck goals  5xSTS TUG MMT NuStep L5x68mins  Leg press 20# 2x10 Seated row green band 2x10 Standing shoulder ext green 2x10 Marching on airex Heel raises on airex Step ups 4  01/01/24 Leg ext 5# 2x10 HS curls 25#  2x10 NuStep L5x25mins STS 4x5 from elevated mat table Step ups 4 Walking laps with cane 3 big laps  12/26/23:  Nustep L 5 x 6 min Ue's  and LE's  B knee flexion 25# 3 x 10 B knee ext 5# 3 x 10 Standing for horizontal arm  swings with therabar, 3# 15 x Standing forward punches with 3 # medibar 10 x Sit to stand from elevated mat with red mediball forward punches 5 x 2 Gait 4 laps, 400' with st cane and SBA Alt toe taps to target on 4 step 10 x each leg Forward step ups 4' step with B hand rails 10 x each leg  Seated rows with blue t band 20x Standing alt hip abd 20 x    12/24/23:  BP L arm 115 /87, HR 103 Nustep L 5 x 6 min Ue's  and LE's  B knee flexion 25# 3 x 10 B knee ext 5# 3 x 10 Standing for horizontal arm swings with therabar, 2# 15 x Standing forward punches with 2 # medibar 10 x Sit to stand from elevated mat with red mediball forward punches 5 x 2 Gait 3 laps, 100' each with cane  Seated rows with blue t band 15 x Standing for hip abd, 5 x 2 each leg    12/12/23: Nustep L 5 x 6 min Standing therabar 3# forward shoulder presses 10 x 2 Standing therabar 3# horizontal shoulder swings in stanidng Gait 3 laps with cane, 300' Sit to stand from elevated mat with red mediball punches 10 x Forward taps to 4 step ,  B hands on rails 10 each Standing on airex or B heel raises in ll bars  Forward step ups onto 4 step 10 x each leg Seated for blue theraband rows 2 x 10 Standing at counter for alt hip abd  12/05/23 NuStep L5x59mins  TUG 19s Ball squeeze 2x10 STS 2x10 from elevated mat  HS curls 25# 2x10 Leg ext 5# 2x10 Seated rows green band 2x10 OHP with yellow ball 2x10 Chest press with yellow ball 2x10 Seated rotations with yellow ball 2x10  12/03/23:  Nustep Ue's and LE's 6 min level 5  Seated rows 15 # 20 reps Seated lat pulls 20# 15 reps B knee flexion 25#  B knee ext 5# 1 set 20, therapist assisted with terminal extension Gait x 400' with st cane, 4  laps Leg press 20# 15 x B Les and staggered position, 15 x each  Standing therabar 2# forward shoulder presses 10 x 2  11/28/23:  Nustep L 5 x 6 min Ue's and LE's Standing B heel raises 10 x 2  Seated rows 20# 15x Seated B knee ext 5# 10x  Seated B knee flexion 25# 2 x 10 Seated yellow theraband B shoulder ER 15 x Gait x 300', 3 laps with st cane  Leg press B  20 # 2 x 10 Standing forward punches with 2# therabar 10 x Standing alt high marches 12 x  11/26/23:  Nustep level 5 x 6 min Ue's and LE's B knee ext 5# 1 x 10 B knee flexion 25# 1 x 10  Standing high marches  Standing B heel raises B hand support Sit to stand from elevated mat, 10 x hands on knees  Seated rows, blue band, 15 reps Seated B shoulder ER yellow band 15 reps Standing forward punches with 2# therabar 10 reps   Gait with st cane 2 laps, 200'  Leg press 20# 10 x 1   11/15/23- EVAL, HEP    PATIENT EDUCATION:  Education details: HEP and POC Person educated: Patient and Child(ren) Education method: Medical illustrator Education comprehension: verbalized understanding, returned demonstration, verbal cues required, and tactile cues required  HOME EXERCISE PROGRAM: Access Code: [email or web address] URL: [email or web address]  Date: 11/15/2023 Prepared by: Almetta Fam  Exercises - Sit to Stand with Armchair  - 1 x daily - 7 x weekly - 2 sets - 10 reps - Standing March with Counter Support  - 1 x daily - 7 x weekly - 2 sets - 10 reps - Standing Hip Abduction with Counter Support  - 1 x daily - 7 x weekly - 2 sets - 10 reps - Heel Raises with Counter Support  - 1 x daily - 7 x weekly - 2 sets - 10 reps - Seated Hip Abduction with Resistance  - 1 x daily - 7 x weekly - 2 sets - 10 reps - Seated March with Resistance  - 1 x daily - 7 x weekly - 2 sets - 10 reps  ASSESSMENT:  CLINICAL IMPRESSION:  patient has made good strides to meet goals as checked today for progress note. Continued to focus  session on bulk strengthening as well as some postural strengthening. Does the best he has with STS today. He does report some fatigue and tiredness today. He have a stent procedure for aorta and abdomen schedulded for tomorrow. Should benefit from ongoing PT to address his stamina and efficiency with transitional movements.  Patient is a 88 y.o. male who was seen today for physical therapy evaluation and treatment for weakness. He has some weakness in his lower extremities, especially with hip flexion. The LLE is weaker than the R. He has been ambulating with a cane for about a year. Patient lives with daughter, and she reports that he is sedentary and lack physical activity. He will benefit from skilled PT to address his strength deficits to improve functional independence and decrease his risk for falls. Patient is hard of hearing and has some delayed processing.   OBJECTIVE IMPAIRMENTS: Abnormal gait, decreased balance, decreased strength, and decreased safety awareness.   ACTIVITY LIMITATIONS: sitting, standing, squatting, stairs, and locomotion level  PERSONAL FACTORS: Age and Fitness are also affecting patient's functional outcome.   REHAB POTENTIAL: Good  CLINICAL DECISION MAKING: Stable/uncomplicated  EVALUATION COMPLEXITY: Low  GOALS: Goals reviewed with patient? Yes  SHORT TERM GOALS: Target date: 12/20/23  Patient will be independent with initial HEP. Baseline:  Goal status: 12/03/23: progressing  2.  Patient will demonstrate decreased fall risk by scoring < 20 sec on TUG. Baseline: 24s Goal status: MET 19s 12/05/23  3.  Patient will be educated on strategies to decrease risk of falls.  Baseline:  Goal status: IN PROGRESS    LONG TERM GOALS: Target date: 01/24/24  Patient will be independent with advanced/ongoing HEP to improve outcomes and carryover.  Baseline:  Goal status: INITIAL  2.  Patient will complete 5xSTS <20s without UE use Baseline: 27s with push off   Goal status: 12/24/23: ongoing, in progress, 19s MET 01/03/24  3.  Patient will demonstrate improved functional LE strength as demonstrated by 5/5 in BLE. Baseline:  Goal status: IN PROGRESS 01/03/24  4.  Patient will demonstrate decreased fall risk by scoring < 15 sec on TUG. Baseline: 24s Goal status: 18s ongoing 01/03/24    PLAN:  PT FREQUENCY: 2x/week  PT DURATION: 10 weeks  PLANNED INTERVENTIONS: 97110-Therapeutic exercises, 97530- Therapeutic activity, 97112- Neuromuscular re-education, 97535- Self Care, [id number]- Manual therapy, [phone]- Gait training, Patient/Family education, Balance training, Stair training, Cryotherapy, and Moist heat  PLAN FOR NEXT SESSION: LE and functional strengthening, 10th visit PN   Almetta Fam, PT, DPT 01/03/2024, 4:15 PM

## 2024-01-04 ENCOUNTER — Inpatient Hospital Stay (HOSPITAL_COMMUNITY): Payer: Self-pay

## 2024-01-04 ENCOUNTER — Inpatient Hospital Stay (HOSPITAL_COMMUNITY)

## 2024-01-04 ENCOUNTER — Encounter (HOSPITAL_COMMUNITY)
Admission: RE | Disposition: A | Discharge status: Home / Self Care | Payer: Self-pay | Source: Home / Self Care | Attending: Vascular Surgery

## 2024-01-04 ENCOUNTER — Encounter (HOSPITAL_COMMUNITY): Payer: Self-pay | Admitting: Vascular Surgery

## 2024-01-04 ENCOUNTER — Inpatient Hospital Stay (HOSPITAL_COMMUNITY)
Admission: RE | Admit: 2024-01-04 | Discharge: 2024-01-05 | DRG: 213 | Disposition: A | Discharge status: Home / Self Care | Attending: Vascular Surgery | Admitting: Vascular Surgery

## 2024-01-04 ENCOUNTER — Other Ambulatory Visit: Payer: Self-pay

## 2024-01-04 ENCOUNTER — Inpatient Hospital Stay (HOSPITAL_COMMUNITY): Payer: Self-pay | Admitting: Physician Assistant

## 2024-01-04 DIAGNOSIS — Z8249 Family history of ischemic heart disease and other diseases of the circulatory system: Secondary | ICD-10-CM | POA: Diagnosis not present

## 2024-01-04 DIAGNOSIS — I679 Cerebrovascular disease, unspecified: Secondary | ICD-10-CM | POA: Diagnosis not present

## 2024-01-04 DIAGNOSIS — I714 Abdominal aortic aneurysm, without rupture, unspecified: Secondary | ICD-10-CM | POA: Diagnosis not present

## 2024-01-04 DIAGNOSIS — Z888 Allergy status to other drugs, medicaments and biological substances status: Secondary | ICD-10-CM

## 2024-01-04 DIAGNOSIS — I10 Essential (primary) hypertension: Secondary | ICD-10-CM | POA: Diagnosis not present

## 2024-01-04 DIAGNOSIS — I700 Atherosclerosis of aorta: Secondary | ICD-10-CM | POA: Diagnosis present

## 2024-01-04 DIAGNOSIS — Z7982 Long term (current) use of aspirin: Secondary | ICD-10-CM | POA: Diagnosis not present

## 2024-01-04 DIAGNOSIS — I723 Aneurysm of iliac artery: Secondary | ICD-10-CM | POA: Diagnosis not present

## 2024-01-04 DIAGNOSIS — N1831 Chronic kidney disease, stage 3a: Secondary | ICD-10-CM | POA: Diagnosis present

## 2024-01-04 DIAGNOSIS — I252 Old myocardial infarction: Secondary | ICD-10-CM

## 2024-01-04 DIAGNOSIS — I129 Hypertensive chronic kidney disease with stage 1 through stage 4 chronic kidney disease, or unspecified chronic kidney disease: Secondary | ICD-10-CM | POA: Diagnosis present

## 2024-01-04 DIAGNOSIS — Z79899 Other long term (current) drug therapy: Secondary | ICD-10-CM | POA: Diagnosis not present

## 2024-01-04 DIAGNOSIS — Z87891 Personal history of nicotine dependence: Secondary | ICD-10-CM

## 2024-01-04 DIAGNOSIS — I2510 Atherosclerotic heart disease of native coronary artery without angina pectoris: Secondary | ICD-10-CM

## 2024-01-04 DIAGNOSIS — I7143 Infrarenal abdominal aortic aneurysm, without rupture: Principal | ICD-10-CM | POA: Diagnosis present

## 2024-01-04 DIAGNOSIS — E7800 Pure hypercholesterolemia, unspecified: Secondary | ICD-10-CM | POA: Diagnosis present

## 2024-01-04 HISTORY — PX: ABDOMINAL AORTIC ENDOVASCULAR STENT GRAFT: SHX5707

## 2024-01-04 HISTORY — PX: ULTRASOUND GUIDANCE FOR VASCULAR ACCESS: SHX6516

## 2024-01-04 LAB — CBC
HCT: 30.2 % — ABNORMAL LOW (ref 39.0–52.0)
Hemoglobin: 9.8 g/dL — ABNORMAL LOW (ref 13.0–17.0)
MCH: 26 pg (ref 26.0–34.0)
MCHC: 32.5 g/dL (ref 30.0–36.0)
MCV: 80.1 fL (ref 80.0–100.0)
Platelets: 148 K/uL — ABNORMAL LOW (ref 150–400)
RBC: 3.77 MIL/uL — ABNORMAL LOW (ref 4.22–5.81)
RDW: 17.7 % — ABNORMAL HIGH (ref 11.5–15.5)
WBC: 6.4 K/uL (ref 4.0–10.5)
nRBC: 0 % (ref 0.0–0.2)

## 2024-01-04 LAB — BASIC METABOLIC PANEL WITH GFR
Anion gap: 10 (ref 5–15)
BUN: 14 mg/dL (ref 8–23)
CO2: 23 mmol/L (ref 22–32)
Calcium: 8.6 mg/dL — ABNORMAL LOW (ref 8.9–10.3)
Chloride: 107 mmol/L (ref 98–111)
Creatinine, Ser: 1.4 mg/dL — ABNORMAL HIGH (ref 0.61–1.24)
GFR, Estimated: 49 mL/min — ABNORMAL LOW (ref >60)
Glucose, Bld: 121 mg/dL — ABNORMAL HIGH (ref 70–99)
Potassium: 3.2 mmol/L — ABNORMAL LOW (ref 3.5–5.1)
Sodium: 140 mmol/L (ref 135–145)

## 2024-01-04 LAB — PROTIME-INR
INR: 1.2 (ref 0.8–1.2)
Prothrombin Time: 16.2 s — ABNORMAL HIGH (ref 11.4–15.2)

## 2024-01-04 LAB — POCT ACTIVATED CLOTTING TIME
Activated Clotting Time: 233 s
Activated Clotting Time: 239 s
Activated Clotting Time: 256 s

## 2024-01-04 LAB — MAGNESIUM: Magnesium: 1.6 mg/dL — ABNORMAL LOW (ref 1.7–2.4)

## 2024-01-04 LAB — APTT: aPTT: 42 s — ABNORMAL HIGH (ref 24–36)

## 2024-01-04 SURGERY — INSERTION, ENDOVASCULAR STENT GRAFT, AORTA, ABDOMINAL
Anesthesia: General | Site: Groin | Laterality: Bilateral

## 2024-01-04 MED ORDER — CLEVIDIPINE BUTYRATE 0.5 MG/ML IV EMUL
INTRAVENOUS | Status: DC | PRN
  Administered 2024-01-04: 1 mg/h via INTRAVENOUS

## 2024-01-04 MED ORDER — HYDRALAZINE HCL 20 MG/ML IJ SOLN: 5.0000 mg | INTRAMUSCULAR | Status: DC | PRN

## 2024-01-04 MED ORDER — ACETAMINOPHEN 325 MG PO TABS: 325.0000 mg | ORAL_TABLET | ORAL | Status: DC | PRN

## 2024-01-04 MED ORDER — ONDANSETRON HCL 4 MG/2ML IJ SOLN
INTRAMUSCULAR | Status: AC
  Filled 2024-01-04: qty 2

## 2024-01-04 MED ORDER — EPHEDRINE SULFATE-NACL 50-0.9 MG/10ML-% IV SOSY
PREFILLED_SYRINGE | INTRAVENOUS | Status: DC | PRN
  Administered 2024-01-04: 5 mg via INTRAVENOUS

## 2024-01-04 MED ORDER — PROTAMINE SULFATE 10 MG/ML IV SOLN
INTRAVENOUS | Status: DC | PRN
  Administered 2024-01-04: 50 mg via INTRAVENOUS

## 2024-01-04 MED ORDER — SODIUM CHLORIDE 0.9% FLUSH: 3.0000 mL | INTRAVENOUS | Status: DC | PRN

## 2024-01-04 MED ORDER — PHENYLEPHRINE HCL-NACL 20-0.9 MG/250ML-% IV SOLN
INTRAVENOUS | Status: DC | PRN
  Administered 2024-01-04: 40 ug/min via INTRAVENOUS

## 2024-01-04 MED ORDER — LIDOCAINE 2% (20 MG/ML) 5 ML SYRINGE
INTRAMUSCULAR | Status: AC
  Filled 2024-01-04: qty 5

## 2024-01-04 MED ORDER — FENTANYL CITRATE (PF) 250 MCG/5ML IJ SOLN
INTRAMUSCULAR | Status: AC
  Filled 2024-01-04: qty 5

## 2024-01-04 MED ORDER — SODIUM CHLORIDE 0.9% FLUSH
3.0000 mL | Freq: Two times a day (BID) | INTRAVENOUS | Status: DC
  Administered 2024-01-04 – 2024-01-05 (×3): 3 mL via INTRAVENOUS

## 2024-01-04 MED ORDER — CEFAZOLIN SODIUM-DEXTROSE 2-4 GM/100ML-% IV SOLN
2.0000 g | Freq: Three times a day (TID) | INTRAVENOUS | Status: AC
  Administered 2024-01-04 (×2): 2 g via INTRAVENOUS
  Filled 2024-01-04 (×2): qty 100

## 2024-01-04 MED ORDER — PHENYLEPHRINE 80 MCG/ML (10ML) SYRINGE FOR IV PUSH (FOR BLOOD PRESSURE SUPPORT)
PREFILLED_SYRINGE | INTRAVENOUS | Status: AC
Start: 2024-01-04 — End: 2024-01-04
  Filled 2024-01-04: qty 10

## 2024-01-04 MED ORDER — DEXAMETHASONE SOD PHOSPHATE PF 10 MG/ML IJ SOLN
INTRAMUSCULAR | Status: DC | PRN
  Administered 2024-01-04: 5 mg via INTRAVENOUS

## 2024-01-04 MED ORDER — FENTANYL CITRATE (PF) 100 MCG/2ML IJ SOLN: 25.0000 ug | INTRAMUSCULAR | Status: DC | PRN

## 2024-01-04 MED ORDER — SODIUM CHLORIDE 0.9 % IV SOLN: 250.0000 mL | INTRAVENOUS | Status: DC | PRN

## 2024-01-04 MED ORDER — LIDOCAINE 2% (20 MG/ML) 5 ML SYRINGE
INTRAMUSCULAR | Status: DC | PRN
  Administered 2024-01-04: 40 mg via INTRAVENOUS

## 2024-01-04 MED ORDER — FENTANYL CITRATE (PF) 250 MCG/5ML IJ SOLN
INTRAMUSCULAR | Status: DC | PRN
  Administered 2024-01-04: 100 ug via INTRAVENOUS
  Administered 2024-01-04: 75 ug via INTRAVENOUS
  Administered 2024-01-04: 50 ug via INTRAVENOUS
  Administered 2024-01-04: 25 ug via INTRAVENOUS

## 2024-01-04 MED ORDER — BISACODYL 5 MG PO TBEC: 5.0000 mg | DELAYED_RELEASE_TABLET | Freq: Every day | ORAL | Status: DC | PRN

## 2024-01-04 MED ORDER — ACETAMINOPHEN 650 MG RE SUPP: 325.0000 mg | RECTAL | Status: DC | PRN

## 2024-01-04 MED ORDER — PHENYLEPHRINE HCL-NACL 20-0.9 MG/250ML-% IV SOLN
INTRAVENOUS | Status: DC | PRN
Start: 2024-01-04 — End: 2024-01-04

## 2024-01-04 MED ORDER — LACTATED RINGERS IV SOLN: INTRAVENOUS | Status: DC

## 2024-01-04 MED ORDER — ASPIRIN 81 MG PO TBEC
81.0000 mg | DELAYED_RELEASE_TABLET | Freq: Every day | ORAL | Status: DC
  Administered 2024-01-05: 81 mg via ORAL
  Filled 2024-01-04: qty 1

## 2024-01-04 MED ORDER — ROSUVASTATIN CALCIUM 20 MG PO TABS
40.0000 mg | ORAL_TABLET | Freq: Every day | ORAL | Status: DC
  Administered 2024-01-05: 40 mg via ORAL
  Filled 2024-01-04: qty 2

## 2024-01-04 MED ORDER — PHENYLEPHRINE 80 MCG/ML (10ML) SYRINGE FOR IV PUSH (FOR BLOOD PRESSURE SUPPORT)
PREFILLED_SYRINGE | INTRAVENOUS | Status: DC | PRN
  Administered 2024-01-04 (×2): 40 ug via INTRAVENOUS
  Administered 2024-01-04: 160 ug via INTRAVENOUS
  Administered 2024-01-04: 40 ug via INTRAVENOUS
  Administered 2024-01-04: 80 ug via INTRAVENOUS

## 2024-01-04 MED ORDER — CHLORHEXIDINE GLUCONATE 0.12 % MT SOLN
15.0000 mL | Freq: Once | OROMUCOSAL | Status: AC
  Administered 2024-01-04: 15 mL via OROMUCOSAL
  Filled 2024-01-04: qty 15

## 2024-01-04 MED ORDER — ISOSORBIDE MONONITRATE ER 30 MG PO TB24
30.0000 mg | ORAL_TABLET | Freq: Every day | ORAL | Status: DC
  Administered 2024-01-05: 30 mg via ORAL
  Filled 2024-01-04: qty 1

## 2024-01-04 MED ORDER — ONDANSETRON HCL 4 MG/2ML IJ SOLN
INTRAMUSCULAR | Status: DC | PRN
  Administered 2024-01-04: 4 mg via INTRAVENOUS

## 2024-01-04 MED ORDER — CHLORHEXIDINE GLUCONATE CLOTH 2 % EX PADS: 6.0000 | MEDICATED_PAD | Freq: Once | CUTANEOUS | Status: DC

## 2024-01-04 MED ORDER — OXYCODONE HCL 5 MG PO TABS: 5.0000 mg | ORAL_TABLET | Freq: Once | ORAL | Status: DC | PRN

## 2024-01-04 MED ORDER — IODIXANOL 320 MG/ML IV SOLN
INTRAVENOUS | Status: DC | PRN
  Administered 2024-01-04: 135 mL via INTRA_ARTERIAL

## 2024-01-04 MED ORDER — ROCURONIUM BROMIDE 10 MG/ML (PF) SYRINGE
PREFILLED_SYRINGE | INTRAVENOUS | Status: DC | PRN
  Administered 2024-01-04: 50 mg via INTRAVENOUS
  Administered 2024-01-04 (×2): 5 mg via INTRAVENOUS
  Administered 2024-01-04: 20 mg via INTRAVENOUS

## 2024-01-04 MED ORDER — HEPARIN SODIUM (PORCINE) 1000 UNIT/ML IJ SOLN
INTRAMUSCULAR | Status: DC | PRN
  Administered 2024-01-04 (×3): 2000 [IU] via INTRAVENOUS
  Administered 2024-01-04: 3000 [IU] via INTRAVENOUS
  Administered 2024-01-04: 2000 [IU] via INTRAVENOUS

## 2024-01-04 MED ORDER — TRAMADOL HCL 50 MG PO TABS: 50.0000 mg | ORAL_TABLET | Freq: Four times a day (QID) | ORAL | Status: DC | PRN

## 2024-01-04 MED ORDER — HEPARIN SODIUM (PORCINE) 5000 UNIT/ML IJ SOLN
5000.0000 [IU] | Freq: Two times a day (BID) | INTRAMUSCULAR | Status: DC
  Administered 2024-01-05: 5000 [IU] via SUBCUTANEOUS
  Filled 2024-01-04: qty 1

## 2024-01-04 MED ORDER — ROCURONIUM BROMIDE 10 MG/ML (PF) SYRINGE
PREFILLED_SYRINGE | INTRAVENOUS | Status: AC
Start: 2024-01-04 — End: 2024-01-04
  Filled 2024-01-04: qty 10

## 2024-01-04 MED ORDER — PROPOFOL 10 MG/ML IV BOLUS
INTRAVENOUS | Status: DC | PRN
  Administered 2024-01-04: 130 mg via INTRAVENOUS

## 2024-01-04 MED ORDER — HYDROMORPHONE HCL 1 MG/ML IJ SOLN: 0.5000 mg | INTRAMUSCULAR | Status: DC | PRN

## 2024-01-04 MED ORDER — DROPERIDOL 2.5 MG/ML IJ SOLN: 0.6250 mg | Freq: Once | INTRAMUSCULAR | Status: DC | PRN

## 2024-01-04 MED ORDER — EPHEDRINE 5 MG/ML INJ
INTRAVENOUS | Status: AC
  Filled 2024-01-04: qty 5

## 2024-01-04 MED ORDER — HEPARIN 6000 UNIT IRRIGATION SOLUTION
Status: DC | PRN
  Administered 2024-01-04: 1

## 2024-01-04 MED ORDER — POTASSIUM CHLORIDE CRYS ER 20 MEQ PO TBCR: 40.0000 meq | EXTENDED_RELEASE_TABLET | Freq: Every day | ORAL | Status: DC | PRN

## 2024-01-04 MED ORDER — DOCUSATE SODIUM 100 MG PO CAPS
100.0000 mg | ORAL_CAPSULE | Freq: Every day | ORAL | Status: DC
  Administered 2024-01-05: 100 mg via ORAL
  Filled 2024-01-04: qty 1

## 2024-01-04 MED ORDER — OXYCODONE HCL 5 MG/5ML PO SOLN: 5.0000 mg | Freq: Once | ORAL | Status: DC | PRN

## 2024-01-04 MED ORDER — PANTOPRAZOLE SODIUM 40 MG PO TBEC
40.0000 mg | DELAYED_RELEASE_TABLET | Freq: Every day | ORAL | Status: DC
  Administered 2024-01-05: 40 mg via ORAL
  Filled 2024-01-04: qty 1

## 2024-01-04 MED ORDER — PROPOFOL 10 MG/ML IV BOLUS
INTRAVENOUS | Status: AC
Start: 2024-01-04 — End: 2024-01-04
  Filled 2024-01-04: qty 20

## 2024-01-04 MED ORDER — ORAL CARE MOUTH RINSE: 15.0000 mL | Freq: Once | OROMUCOSAL | Status: AC

## 2024-01-04 MED ORDER — SENNOSIDES-DOCUSATE SODIUM 8.6-50 MG PO TABS: 1.0000 | ORAL_TABLET | Freq: Every evening | ORAL | Status: DC | PRN

## 2024-01-04 MED ORDER — CEFAZOLIN SODIUM-DEXTROSE 2-4 GM/100ML-% IV SOLN
2.0000 g | INTRAVENOUS | Status: AC
  Administered 2024-01-04: 2 g via INTRAVENOUS
  Filled 2024-01-04: qty 100

## 2024-01-04 MED ORDER — SODIUM CHLORIDE 0.9 % IV SOLN: INTRAVENOUS | Status: DC

## 2024-01-04 MED ORDER — PHENOL 1.4 % MT LIQD: 1.0000 | OROMUCOSAL | Status: DC | PRN

## 2024-01-04 MED ORDER — ACETAMINOPHEN 10 MG/ML IV SOLN: 1000.0000 mg | Freq: Once | INTRAVENOUS | Status: DC | PRN

## 2024-01-04 MED ORDER — LABETALOL HCL 5 MG/ML IV SOLN: 10.0000 mg | INTRAVENOUS | Status: DC | PRN

## 2024-01-04 MED ORDER — 0.9 % SODIUM CHLORIDE (POUR BTL) OPTIME
TOPICAL | Status: DC | PRN
  Administered 2024-01-04: 1000 mL

## 2024-01-04 MED ORDER — FLEET ENEMA RE ENEM: 1.0000 | ENEMA | Freq: Once | RECTAL | Status: DC | PRN

## 2024-01-04 MED ORDER — METOPROLOL TARTRATE 5 MG/5ML IV SOLN: 2.5000 mg | INTRAVENOUS | Status: DC | PRN

## 2024-01-04 MED ORDER — SUGAMMADEX SODIUM 200 MG/2ML IV SOLN
INTRAVENOUS | Status: DC | PRN
  Administered 2024-01-04: 200 mg via INTRAVENOUS

## 2024-01-04 SURGICAL SUPPLY — 55 items
BAG COUNTER SPONGE SURGICOUNT (BAG) ×1 IMPLANT
CANISTER SUCTION 3000ML PPV (SUCTIONS) ×1 IMPLANT
CATH BEACON 5.038 65CM KMP-01 (CATHETERS) ×1 IMPLANT
CATH OMNI FLUSH .035X70CM (CATHETERS) ×1 IMPLANT
CATH QUICKCROSS SUPP .035X90CM (MICROCATHETER) IMPLANT
CLIP TI MEDIUM 6 (CLIP) ×1 IMPLANT
CLIP TI WIDE RED SMALL 6 (CLIP) ×1 IMPLANT
CLOSURE PERCLOSE PROSTYLE (Vascular Products) IMPLANT
COIL IDC 2D .035 12MMX20CM (Embolic) IMPLANT
COIL IDC 2D .035 15MMX20CM (Embolic) IMPLANT
COIL IDC 2D .035 15MMX40CM (Embolic) IMPLANT
COIL IDC 2D .035 6MMX20CM (Embolic) IMPLANT
COIL IDC 2D .035 8MMX20CM (Embolic) IMPLANT
COIL IDC 360 .035 15MMX40CM (Embolic) IMPLANT
COIL IDC 360 .035 20MMX40CM (Embolic) IMPLANT
DERMABOND ADVANCED .7 DNX12 (GAUZE/BANDAGES/DRESSINGS) ×1 IMPLANT
DEVICE CLOSURE PERCLS PRGLD 6F (Vascular Products) ×4 IMPLANT
DEVICE ENSNARE 12MMX20MM (VASCULAR PRODUCTS) IMPLANT
DRSG TEGADERM 2-3/8X2-3/4 SM (GAUZE/BANDAGES/DRESSINGS) ×2 IMPLANT
ELECTRODE REM PT RTRN 9FT ADLT (ELECTROSURGICAL) ×2 IMPLANT
EXCLUDER TNK LEG 26MX14X18 (Endovascular Graft) IMPLANT
GAUZE SPONGE 2X2 8PLY STRL LF (GAUZE/BANDAGES/DRESSINGS) ×1 IMPLANT
GLIDEWIRE ADV .035X180CM (WIRE) ×2 IMPLANT
GLIDEWIRE ADV .035X260CM (WIRE) IMPLANT
GLOVE BIOGEL PI IND STRL 8 (GLOVE) ×1 IMPLANT
GOWN STRL REUS W/ TWL LRG LVL3 (GOWN DISPOSABLE) ×3 IMPLANT
GOWN STRL REUS W/TWL 2XL LVL3 (GOWN DISPOSABLE) ×2 IMPLANT
GRAFT BALLN CATH 65CM (BALLOONS) ×1 IMPLANT
GRAFT EXCLUDER AORTIC 28X3.3CM (Endovascular Graft) IMPLANT
KIT BASIN OR (CUSTOM PROCEDURE TRAY) ×1 IMPLANT
KIT TURNOVER KIT B (KITS) ×1 IMPLANT
LEG CONTRALATERAL 16X12X12 (Vascular Products) IMPLANT
LEG CONTRALATERAL 27X14 (Vascular Products) IMPLANT
PACK ENDOVASCULAR (PACKS) ×1 IMPLANT
PAD ARMBOARD POSITIONER FOAM (MISCELLANEOUS) ×2 IMPLANT
SET MICROPUNCTURE 5F STIFF (MISCELLANEOUS) ×1 IMPLANT
SHEATH APTUS 6.5FR 45CM (SHEATH) IMPLANT
SHEATH APTUS 7.0FR 180D 55 (SHEATH) IMPLANT
SHEATH BRITE TIP 8FR 23CM (SHEATH) ×1 IMPLANT
SHEATH DRYSEAL FLEX 12FR 45CM (SHEATH) IMPLANT
SHEATH DRYSEAL FLEX 16FR 33CM (SHEATH) IMPLANT
SHEATH PINNACLE 8F 10CM (SHEATH) ×1 IMPLANT
SOLN 0.9% NACL POUR BTL 1000ML (IV SOLUTION) ×1 IMPLANT
STOPCOCK MORSE 400PSI 3WAY (MISCELLANEOUS) ×1 IMPLANT
SUT MNCRL AB 4-0 PS2 18 (SUTURE) ×2 IMPLANT
SUT PROLENE 5 0 C 1 24 (SUTURE) IMPLANT
SUT VIC AB 2-0 CTX 36 (SUTURE) IMPLANT
SUT VIC AB 3-0 SH 27X BRD (SUTURE) IMPLANT
SYR 20ML LL LF (SYRINGE) ×1 IMPLANT
TOWEL GREEN STERILE (TOWEL DISPOSABLE) ×1 IMPLANT
TRAY FOLEY MTR SLVR 14FR STAT (SET/KITS/TRAYS/PACK) IMPLANT
TRAY FOLEY MTR SLVR 16FR STAT (SET/KITS/TRAYS/PACK) ×1 IMPLANT
TUBING HIGH PRESSURE 120CM (CONNECTOR) ×1 IMPLANT
WIRE AMPLATZ SS-J .035X180CM (WIRE) IMPLANT
WIRE BENTSON .035X145CM (WIRE) ×2 IMPLANT

## 2024-01-04 NOTE — Anesthesia Procedure Notes (Signed)
 Procedure Name: Intubation Date/Time: 01/04/2024 9:58 AM  Performed by: Roslynn Waddell LABOR, CRNAPre-anesthesia Checklist: Patient identified, Emergency Drugs available, Suction available and Patient being monitored Patient Re-evaluated:Patient Re-evaluated prior to induction Oxygen Delivery Method: Circle System Utilized Preoxygenation: Pre-oxygenation with 100% oxygen Induction Type: IV induction Ventilation: Mask ventilation without difficulty Laryngoscope Size: Mac and 4 Grade View: Grade II Tube type: Oral Tube size: 7.5 mm Number of attempts: 1 Airway Equipment and Method: Stylet and Oral airway Placement Confirmation: ETT inserted through vocal cords under direct vision, positive ETCO2 and breath sounds checked- equal and bilateral Secured at: 23 cm Tube secured with: Tape Dental Injury: Teeth and Oropharynx as per pre-operative assessment  Comments: Atraumatic induction/intubation. Dentition and oral mucosa as per preop.

## 2024-01-04 NOTE — Discharge Instructions (Signed)

## 2024-01-04 NOTE — Progress Notes (Signed)
 Pt received from OR, awake and appropriate.  Bilateral groins with skin glue are unremarkable.  Tele applied and CCMD notified.  VS taken and charted.  Plan of care reviewed with Pt and daughter.  Will cont to monitor.

## 2024-01-04 NOTE — Transfer of Care (Signed)
 Immediate Anesthesia Transfer of Care Note  Patient: Rutha LITTIE Ned  Procedure(s) Performed: INSERTION, ENDOVASCULAR STENT GRAFT, AORTA, ABDOMINAL; COIL EMBOLIZATION OF LEFT INTERNAL ILIAC ARTERY ANEURYSM (Bilateral: Groin) ULTRASOUND GUIDANCE, FOR VASCULAR ACCESS (Bilateral: Groin)  Patient Location: PACU  Anesthesia Type:General  Level of Consciousness: awake and alert   Airway & Oxygen Therapy: Patient Spontanous Breathing and Patient connected to nasal cannula oxygen  Post-op Assessment: Report given to RN and Post -op Vital signs reviewed and stable  Post vital signs: Reviewed and stable  Last Vitals:  Vitals Value Taken Time  BP 150/86 01/04/24 12:36  Temp    Pulse 77 01/04/24 12:42  Resp 18 01/04/24 12:42  SpO2 98 % 01/04/24 12:42  Vitals shown include unfiled device data.  Last Pain:  Vitals:   01/04/24 0746  TempSrc:   PainSc: 0-No pain      Patients Stated Pain Goal: 0 (01/04/24 0746)  Complications: No notable events documented.

## 2024-01-04 NOTE — H&P (Signed)
 Office Note   Patient seen and examined in preop holding.  No complaints. No changes to medication history or physical exam since last seen in clinic. After discussing the risks and benefits of EVAR with left hypo coiling for asymptomatic AAA, left hypo aneurysm, Juan Goodwin elected to proceed.   Fonda FORBES Rim MD   CC: 7.5 cm AAA Requesting Provider:  No ref. provider found  HPI: Juan Goodwin is a 88 y.o. (1935-12-05) male presenting at the request of .[name], MD 7.5 cm AAA.  On exam, Juan Goodwin was doing well, accompanied by his daughter.  A native of [place] , he moved to [place] years ago.  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  Juan Goodwin was unaware of his abdominal aortic aneurysm until it was incidentally found on recent CT which was obtained due to concern for malignancy after significant weight loss.  He was booked for an urgent visit with me with subsequent CTA to further define the aneurysm. Juan Goodwin denies abdominal pain, chest pain, back pain.  He has had 50 pound weight loss over the last year, and has anemia which hematology oncology believes is multifactorial.  There appears to be a component of failure to thrive however he remains fairly independent, at ambulates using a cane, and works with physical therapy on a regular basis. Denies claudication, ischemic rest pain, tissue loss.  The pt is  on a statin for cholesterol management.  The pt is  on a daily aspirin .     Past Medical History:  Diagnosis Date   AAA (abdominal aortic aneurysm)    Acute pyloric channel ulcer 07/13/2014   Acute upper GI bleed 07/13/2014   Aortic atherosclerosis    Ascending aorta dilatation    4.3cm by echo 09/2022 and 3.9cm by Chest CT 03/2023.   Blood transfusion without reported diagnosis    CAD (coronary artery disease)    Cerebrovascular disease 10/08/2018   Chronic kidney disease, stage 3a (HCC)  03/30/2017   Dilated aortic root    38 mm by echo with 04/2021   Gastrointestinal hemorrhage with melena 06/2014   Global aphasia 03/27/2017   Helicobacter pylori gastritis 07/13/2014   High cholesterol    Hypernatremia 07/13/2014   Hypertension    Hypokalemia 07/13/2014   Myocardial infarction [facility])    mild heart attack in 2022   Normocytic normochromic anemia 04/10/2017   REM sleep behavior disorder 10/08/2018   Subdural hematoma (HCC) 03/26/2017   Thrombocytopenia 07/13/2014    Past Surgical History:  Procedure Laterality Date   BRAIN SURGERY     CRANIOTOMY Left 03/26/2017   Procedure: CRANIOTOMY HEMATOMA EVACUATION SUBDURAL;  Surgeon: Onetha Kuba, MD;  Location[facility] OR;  Service: Neurosurgery;  Laterality: Left;   ESOPHAGOGASTRODUODENOSCOPY N/A 07/11/2014   Procedure: ESOPHAGOGASTRODUODENOSCOPY (EGD);  Surgeon: Jerrell Sol, MD;  Location: [facility];  Service: Endoscopy;  Laterality: N/A;   LEFT HEART CATH AND CORONARY ANGIOGRAPHY N/A 04/26/2020   Procedure: LEFT HEART CATH AND CORONARY ANGIOGRAPHY;  Surgeon: Dann Candyce RAMAN, MD;  Location: [facility] INVASIVE CV LAB;  Service: Cardiovascular;  Laterality: N/A;    Social History   Socioeconomic History   Marital status: Widowed    Spouse name: Not on file   Number of children: 3   Years of education: 14   Highest education level: Not on file  Occupational History   Not on file  Tobacco Use   Smoking status:  Former    Current packs/day: 1.00    Average packs/day: 1 pack/day for 35.0 years (35.0 ttl pk-yrs)    Types: Cigarettes   Smokeless tobacco: Never  Vaping Use   Vaping status: Never Used  Substance and Sexual Activity   Alcohol use: No   Drug use: Not Currently   Sexual activity: Never  Other Topics Concern   Not on file  Social History Narrative   Right handed    Occasional cup of coffee    Lives at home alone   Wife is deceased along with 2 children 1 child is living     Social Drivers of Manufacturing engineer Strain: Not on file  Food Insecurity: No Food Insecurity (11/08/2021)   Hunger Vital Sign    Worried About Running Out of Food in the Last Year: Never true    Ran Out of Food in the Last Year: Never true  Transportation Needs: No Transportation Needs (11/08/2021)   PRAPARE - Administrator, Civil Service (Medical): No    Lack of Transportation (Non-Medical): No  Physical Activity: Inactive (11/08/2021)   Exercise Vital Sign    Days of Exercise per Week: 0 days    Minutes of Exercise per Session: 0 min  Stress: No Stress Concern Present (11/08/2021)   [employer] of Occupational Health - Occupational Stress Questionnaire    Feeling of Stress : Only a little  Social Connections: Not on file  Intimate Partner Violence: Not on file   Family History  Problem Relation Age of Onset   Heart failure Mother     Current Facility-Administered Medications  Medication Dose Route Frequency Provider Last Rate Last Admin   0.9 %  sodium chloride  infusion   Intravenous Continuous Mellisa Arshad E, MD       ceFAZolin  (ANCEF ) IVPB 2g/100 mL premix  2 g Intravenous 30 min Pre-Op Maddux First E, MD       Chlorhexidine Gluconate Cloth 2 % PADS 6 each  6 each Topical Once Dimple Bastyr E, MD       And   Chlorhexidine Gluconate Cloth 2 % PADS 6 each  6 each Topical Once Shivaay Stormont E, MD       lactated ringers infusion   Intravenous Continuous Lucious Debby BRAVO, MD        Allergies  Allergen Reactions   Doxazosin Mesylate Other (See Comments)    Unknown reaction- pt unsure     REVIEW OF SYSTEMS:  [X]  denotes positive finding, [ ]  denotes negative finding Cardiac  Comments:  Chest pain or chest pressure:    Shortness of breath upon exertion:    Short of breath when lying flat:    Irregular heart rhythm:        Vascular    Pain in calf, thigh, or hip brought on by ambulation:    Pain in feet at night that wakes you up from your sleep:     Blood clot in  your veins:    Leg swelling:         Pulmonary    Oxygen at home:    Productive cough:     Wheezing:         Neurologic    Sudden weakness in arms or legs:     Sudden numbness in arms or legs:     Sudden onset of difficulty speaking or slurred speech:    Temporary loss of vision in one eye:  Problems with dizziness:         Gastrointestinal    Blood in stool:     Vomited blood:         Genitourinary    Burning when urinating:     Blood in urine:        Psychiatric    Major depression:         Hematologic    Bleeding problems:    Problems with blood clotting too easily:        Skin    Rashes or ulcers:        Constitutional    Fever or chills:      PHYSICAL EXAMINATION:  Vitals:   01/04/24 0719 01/04/24 0746 01/04/24 0749 01/04/24 0756  BP: (!) 158/101 (!) 163/105 (!) 173/115 (!) 175/112  Pulse: 90     Resp: 18     Temp: 97.6 F (36.4 C)     TempSrc: Oral     SpO2: 99%     Weight: 68 kg     Height: 6' 1 (1.854 m)       General:  WDWN in NAD; vital signs documented above Gait: Not observed HENT: WNL, normocephalic Pulmonary: normal non-labored breathing , without wheezing Cardiac: regular HR Abdomen: soft, NT, no masses Skin: without rashes Vascular Exam/Pulses:  Right Left  Radial 2+ (normal) 2+ (normal)  Ulnar    Femoral    Popliteal 2+ (normal) 2+ (normal)  DP 2+ (normal) 2+ (normal)  PT     Extremities: without ischemic changes, without Gangrene , without cellulitis; without open wounds;  Musculoskeletal: no muscle wasting or atrophy  Neurologic: A&O X 3;  No focal weakness or paresthesias are detected Psychiatric:  The pt has Normal affect.   Non-Invasive Vascular Imaging:    See CTA    ASSESSMENT/PLAN: Juan Goodwin is a 88 y.o. male presenting with asymptomatic 7.5 cm infrarenal abdominal aneurysm with concomitant left internal iliac aneurysm.  On physical exam, he had palpable pulses in the feet.  No masses in the popliteal  fossa bilaterally.  Readily palpable aneurysm in the abdomen.  Asymptomatic.  Imaging was reviewed demonstrating 7.5 cm infrarenal abdominal aneurysm with a sufficient neck for endovascular repair.  The left internal iliac aneurysm is problematic, as it is not amenable to iliac branch endoprosthesis.  Juan Goodwin will need the iliac aneurysm coiled, as well as the feeder vessels.  My plan is for infrarenal endovascular aortic repair with coil of the left internal iliac artery, its branches, and subsequent coverage.  Both he and his daughter are aware that this comes with increased risk of endoleak.  I think that the odds he has endoleak are higher than average simply due to the size of the aneurysm and large IMA.  Regardless, he is not an open surgical candidate.  I also discussed that with his current medical comorbidities, we could forego surgery, but this comes at a risk of rupture of roughly 30 %/year.  Rupture in the setting would be a mortal event.  After discussing the risks and benefits of the above, Juan Goodwin elected to proceed with endovascular repair.  My plan is to send him back to Dr. Shlomo for cardiac preoperative risk assessment.  Once cleared, I plan to prioritize his surgical scheduling.   Fonda FORBES Rim, MD [facility] [phone]

## 2024-01-04 NOTE — Op Note (Addendum)
 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  General esthesia was induced and the patient's prepped draped standard fashion.  The case began with ultrasound insonation of bilateral common femoral arteries.  These were accessed using an ultrasound-guided micropuncture needle.  A wire was run into the aorta, followed by dilation and using the inner portion of an 8 French sheath.  The patient was heparinized.  This was followed by 2 Pro-glide placements in bilateral groins and preclosed technique.  Next, a 12 French sheath was placed in the right groin and an 8 French sheath placed in the left groin.  I wire was snared and pulled  from left to right.  Over this wire the 12 French Gore dry seal was delivered to the left common iliac artery.  Angiography followed demonstrating the large left-sided hypogastric artery aneurysm.  I then took a 7 French steerable sheath through the 12 French Gore dry seal and parked it in the hypogastric artery aneurysm.  Next a series of wires were used to cannulate the 2 large branches at the base of the aneurysm.  I then moved to place a catheter and this and began delivering coils.  The largest branch received an 8 x 20 cm coil, and the smaller branch received a 6 x 20 cm coil the bifurcation point received a 6 x 20 coil.  Next, I moved to coil of the entirety of the hypogastric artery aneurysm using 7 coils of various sizes.  See above in the findings portion.  Once the aneurysm was coiled, I moved to endovascular aortic repair.  The right sided 12 French sheath was exchanged for a 16 French sheath in the left sided 8 French sheath was exchanged for a 16 French sheath with wires bilaterally running into the descending aorta.  The graft was brought onto the field and placed through the left groin graft size 26 x 14 x 18.  In the right groin, a pigtail catheter was placed in the level of the renal arteries and angiogram followed.  This demonstrated widely patent aorta with widely patent renal arteries.  The left renal artery was the lowest.  The  aneurysm was quite large.  There was no significant filling of the left hypogastric artery aneurysm due to recent coiling.  At this point, the Gore C tag endoprosthesis was positioned, and deployed.  The graft slipped a small amount, but we thought that we had seal.  Next, I moved to cannulation of the right sided gate.  This was done using a KMP catheter and Glidewire advantage.  Once cannulated, I ensured that I was within the graft by spinning the catheter and performing angiography.  Next, angiography was performed through the right 16 French sheath to ensure the  weight we did not cover the right hypogastric artery.  This was marked and a 27 x 14 flared limb was brought onto the field positioned and deployed to the level of the right sided common iliac artery bifurcation.  Next I moved to the right.  The remainder of the main body was deployed followed by a 12 x 12 limb to cover the hypogastric artery.  I used a mob balloon to ensure the device was fully opened.  Completion angiography demonstrated what appeared to be an endoleak proximally.  I elected to place a cuff.  The cuff was 28 x 33 and was again hit with a mob balloon.  Follow-up angiography demonstrated excellent result with resolution of the 1A endoleak.  There appeared to be a type II endoleak that was small.  I was very happy with this result.  Sheaths were removed and Pro-glide's deployed without issue.  Bilateral groin angiograms followed to ensure that the common femoral arteries were patent and there was runoff into the superficial femoral artery and profunda.  There was.  At this point, the patient's heparin  was reversed.  Groins were closed using Monocryl suture with Dermabond at the level of the skin. [name] had palpable pulses in the feet.  He was taken the PACU in stable condition  Impression: Successful exclusion of AAA.  Successful coiling of left-sided hypogastric artery aneurysm and its branches.  Fonda FORBES Rim, MD [facility] DATE OF DICTATION:   01/04/2024

## 2024-01-04 NOTE — Anesthesia Postprocedure Evaluation (Signed)
 Anesthesia Post Note  Patient: RAYSHAWN MANEY  Procedure(s) Performed: INSERTION, ENDOVASCULAR STENT GRAFT, AORTA, ABDOMINAL; COIL EMBOLIZATION OF LEFT INTERNAL ILIAC ARTERY ANEURYSM (Bilateral: Groin) ULTRASOUND GUIDANCE, FOR VASCULAR ACCESS (Bilateral: Groin)     Patient location during evaluation: PACU Anesthesia Type: General Level of consciousness: awake and alert Pain management: pain level controlled Vital Signs Assessment: post-procedure vital signs reviewed and stable Respiratory status: spontaneous breathing, nonlabored ventilation, respiratory function stable and patient connected to nasal cannula oxygen Cardiovascular status: blood pressure returned to baseline and stable Postop Assessment: no apparent nausea or vomiting Anesthetic complications: no   No notable events documented.  Last Vitals:  Vitals:   01/04/24 1400 01/04/24 1442  BP: (!) 150/89 (!) 160/90  Pulse: 69 69  Resp: 12 11  Temp: 36.4 C 36.4 C  SpO2: 99% 100%    Last Pain:  Vitals:   01/04/24 1442  TempSrc: Oral  PainSc: 0-No pain                 Franky JONETTA Bald

## 2024-01-04 NOTE — Anesthesia Procedure Notes (Signed)
 Arterial Line Insertion Start/End10/24/2025 8:00 AM, 01/04/2024 8:05 AM Performed by: Tilford Franky BIRCH, MD, Roslynn Waddell LABOR, CRNA, CRNA  Patient location: Pre-op. Preanesthetic checklist: patient identified, IV checked, site marked, risks and benefits discussed, surgical consent, monitors and equipment checked, pre-op evaluation, timeout performed and anesthesia consent Lidocaine  1% used for infiltration Left, radial was placed Catheter size: 20 G Hand hygiene performed , maximum sterile barriers used  and Seldinger technique used  Attempts: 1 Procedure performed using ultrasound to evaluate access site. Ultrasound Notes:relevant anatomy identified, ultrasound used to visualize needle entry, vessel patent under ultrasound and image(s) printed for medical record. Following insertion, Biopatch. Post procedure assessment: normal  Patient tolerated the procedure well with no immediate complications. Additional procedure comments:   [name]      Media Information  Document Information  Photos  Arterial line  01/04/2024 08:30  Attached To:  Hospital Encounter on 01/04/24  Source Information  Roslynn Waddell LABOR, CRNA  [facility]

## 2024-01-04 NOTE — Progress Notes (Signed)
 Dr. Tilford made aware of patient's elevated BP readings. (See flowsheet). No new orders received at this time.

## 2024-01-05 ENCOUNTER — Other Ambulatory Visit (HOSPITAL_COMMUNITY): Payer: Self-pay

## 2024-01-05 ENCOUNTER — Encounter: Payer: Self-pay | Admitting: Hematology and Oncology

## 2024-01-05 DIAGNOSIS — Z9889 Other specified postprocedural states: Secondary | ICD-10-CM

## 2024-01-05 DIAGNOSIS — Z95828 Presence of other vascular implants and grafts: Secondary | ICD-10-CM

## 2024-01-05 LAB — CBC
HCT: 30.1 % — ABNORMAL LOW (ref 39.0–52.0)
Hemoglobin: 9.8 g/dL — ABNORMAL LOW (ref 13.0–17.0)
MCH: 25.9 pg — ABNORMAL LOW (ref 26.0–34.0)
MCHC: 32.6 g/dL (ref 30.0–36.0)
MCV: 79.4 fL — ABNORMAL LOW (ref 80.0–100.0)
Platelets: 152 K/uL (ref 150–400)
RBC: 3.79 MIL/uL — ABNORMAL LOW (ref 4.22–5.81)
RDW: 17.8 % — ABNORMAL HIGH (ref 11.5–15.5)
WBC: 7.4 K/uL (ref 4.0–10.5)
nRBC: 0 % (ref 0.0–0.2)

## 2024-01-05 LAB — BASIC METABOLIC PANEL WITH GFR
Anion gap: 10 (ref 5–15)
BUN: 18 mg/dL (ref 8–23)
CO2: 25 mmol/L (ref 22–32)
Calcium: 8.8 mg/dL — ABNORMAL LOW (ref 8.9–10.3)
Chloride: 101 mmol/L (ref 98–111)
Creatinine, Ser: 1.7 mg/dL — ABNORMAL HIGH (ref 0.61–1.24)
GFR, Estimated: 39 mL/min — ABNORMAL LOW (ref >60)
Glucose, Bld: 114 mg/dL — ABNORMAL HIGH (ref 70–99)
Potassium: 3.5 mmol/L (ref 3.5–5.1)
Sodium: 136 mmol/L (ref 135–145)

## 2024-01-05 MED ORDER — TRAMADOL HCL 50 MG PO TABS
50.0000 mg | ORAL_TABLET | Freq: Four times a day (QID) | ORAL | 0 refills | Status: AC | PRN
  Filled 2024-01-05: qty 12, 3d supply, fill #0

## 2024-01-05 NOTE — Progress Notes (Signed)
 Amulated patient  patient tolerated well no discomfort.  Surgi8cal sites remain intact  not bleeding

## 2024-01-05 NOTE — Progress Notes (Addendum)
  Progress Note    01/05/2024 8:39 AM 1 Day Post-Op  Subjective:  no complaints   Vitals:   01/05/24 0647 01/05/24 0838  BP: (!) 154/91 (!) 148/103  Pulse: 78 72  Resp: 18 13  Temp: 98.8 F (37.1 C)   SpO2: 99% 100%   Physical Exam: Cardiac:  RRR Lungs:  non labored Incisions:  groin incisions c/d/I; some fullness L groin but no firm hematoma; palpable L radial after A line removal Extremities:  warm and well perfused Abdomen:  soft, NT, ND Neurologic: A&O  CBC    Component Value Date/Time   WBC 7.4 01/05/2024 0348   RBC 3.79 (L) 01/05/2024 0348   HGB 9.8 (L) 01/05/2024 0348   HGB 10.5 (L) 12/07/2023 1428   HCT 30.1 (L) 01/05/2024 0348   PLT 152 01/05/2024 0348   PLT 193 12/07/2023 1428   MCV 79.4 (L) 01/05/2024 0348   MCH 25.9 (L) 01/05/2024 0348   MCHC 32.6 01/05/2024 0348   RDW 17.8 (H) 01/05/2024 0348   LYMPHSABS 1.4 12/07/2023 1428   MONOABS 0.3 12/07/2023 1428   EOSABS 0.1 12/07/2023 1428   BASOSABS 0.0 12/07/2023 1428    BMET    Component Value Date/Time   NA 136 01/05/2024 0348   K 3.5 01/05/2024 0348   CL 101 01/05/2024 0348   CO2 25 01/05/2024 0348   GLUCOSE 114 (H) 01/05/2024 0348   BUN 18 01/05/2024 0348   CREATININE 1.70 (H) 01/05/2024 0348   CREATININE 1.43 (H) 11/06/2023 1359   CALCIUM  8.8 (L) 01/05/2024 0348   GFRNONAA 39 (L) 01/05/2024 0348   GFRNONAA 48 (L) 11/06/2023 1359   GFRAA 46 (L) 06/08/2018 1357    INR    Component Value Date/Time   INR 1.2 01/04/2024 1234     Intake/Output Summary (Last 24 hours) at 01/05/2024 0839 Last data filed at 01/05/2024 0530 Gross per 24 hour  Intake 1400 ml  Output 1075 ml  Net 325 ml     Assessment/Plan:  88 y.o. male is s/p EVAR with coiling of the left hypogastric  1 Day Post-Op   BLE warm and well perfused.  Groin incisions are well appearing.  Ambulate with mobility this morning.  Office will arrange CTA a/p in 1 month to follow up with Dr. Lanis.  Ok for discharge if  ambulating well   Donnice Sender, PA-C [facility] [phone] 01/05/2024 8:39 AM  VASCULAR STAFF ADDENDUM: I have independently interviewed and examined the patient. I agree with the above.    Fonda FORBES Lanis MD [facility] Phone Number: [phone] 01/05/2024 9:04 AM

## 2024-01-05 NOTE — Plan of Care (Signed)
  Problem: Education: Goal: Knowledge of General Education information will improve Description: Including pain rating scale, medication(s)/side effects and non-pharmacologic comfort measures Outcome: Progressing   Problem: Health Behavior/Discharge Planning: Goal: Ability to manage health-related needs will improve Outcome: Progressing   Problem: Clinical Measurements: Goal: Ability to maintain clinical measurements within normal limits will improve Outcome: Progressing Goal: Will remain free from infection Outcome: Progressing Goal: Diagnostic test results will improve Outcome: Progressing   Problem: Elimination: Goal: Will not experience complications related to bowel motility Outcome: Progressing Goal: Will not experience complications related to urinary retention Outcome: Progressing

## 2024-01-05 NOTE — Progress Notes (Addendum)
 DISCHARGE NOTE HOME Juan Goodwin to be discharged Home per MD order. Discussed prescriptions and follow up appointments with the patient. Prescriptions given to patient; medication list explained in detail. Patient verbalized understanding.  Reviewed stroke education and provided hand outs  BEFAST, 911, prevention, diet and other education provided .   See LDA for bilateral groin incisison as discharge from procedure Patient free of lines, drains, and wounds.   An After Visit Summary (AVS) was printed and given to the patient.  TOC meds delivered bedside to daughter  [place] to continue discharge after 2nd ambulation and wound check  Peyton SHAUNNA Pepper, RN

## 2024-01-06 DIAGNOSIS — I714 Abdominal aortic aneurysm, without rupture, unspecified: Secondary | ICD-10-CM | POA: Diagnosis not present

## 2024-01-06 NOTE — Discharge Summary (Signed)
 EVAR Discharge Summary   Juan Goodwin 1935/12/11 88 y.o. male  MRN: [id number]  Admission Date: 01/04/2024  Discharge Date: 01/05/24  Physician: Dr. Lanis  Admission Diagnosis: Infrarenal abdominal aortic aneurysm (AAA) without rupture [I71.43] Abdominal aortic aneurysm (AAA) greater than 5.5 cm in diameter in male [I71.40]  Discharge Day services:   See progress note 01/05/2024  Hospital Course:  [name] is an 88 year old male who was brought in as an outpatient and underwent endovascular repair of AAA and left hypogastric artery aneurysm with EVAR and coiling of the left hypogastric artery by Dr. Lanis on 01/04/2024.  He tolerated the procedure well and was admitted to the hospital postoperatively.  POD #1 bilateral lower extremities were well-perfused.  Groin incisions were well-appearing without hematoma.  He was able to ambulate without difficulty with the mobility team.  He was ready for discharge home.  He will follow-up in the office in 1 month with a CTA abdomen and pelvis to see Dr. Lanis.  He was prescribed 1 to 2 days of narcotic pain medication for continued postoperative pain control.  He was discharged home in stable condition.  CBC    Component Value Date/Time   WBC 7.4 01/05/2024 0348   RBC 3.79 (L) 01/05/2024 0348   HGB 9.8 (L) 01/05/2024 0348   HGB 10.5 (L) 12/07/2023 1428   HCT 30.1 (L) 01/05/2024 0348   PLT 152 01/05/2024 0348   PLT 193 12/07/2023 1428   MCV 79.4 (L) 01/05/2024 0348   MCH 25.9 (L) 01/05/2024 0348   MCHC 32.6 01/05/2024 0348   RDW 17.8 (H) 01/05/2024 0348   LYMPHSABS 1.4 12/07/2023 1428   MONOABS 0.3 12/07/2023 1428   EOSABS 0.1 12/07/2023 1428   BASOSABS 0.0 12/07/2023 1428    BMET    Component Value Date/Time   NA 136 01/05/2024 0348   K 3.5 01/05/2024 0348   CL 101 01/05/2024 0348   CO2 25 01/05/2024 0348   GLUCOSE 114 (H) 01/05/2024 0348   BUN 18 01/05/2024 0348   CREATININE 1.70 (H) 01/05/2024 0348    CREATININE 1.43 (H) 11/06/2023 1359   CALCIUM  8.8 (L) 01/05/2024 0348   GFRNONAA 39 (L) 01/05/2024 0348   GFRNONAA 48 (L) 11/06/2023 1359   GFRAA 46 (L) 06/08/2018 1357         Discharge Diagnosis:  Infrarenal abdominal aortic aneurysm (AAA) without rupture [I71.43] Abdominal aortic aneurysm (AAA) greater than 5.5 cm in diameter in male [I71.40]  Secondary Diagnosis: Patient Active Problem List   Diagnosis Date Noted   Abdominal aortic aneurysm (AAA) greater than 5.5 cm in diameter in male 01/04/2024   Deficiency anemia 11/06/2023   Sarcopenia 11/06/2023   Chronic constipation 11/06/2023   Aortic atherosclerosis    Ascending aorta dilatation    CAD (coronary artery disease) 10/12/2020   Angina at rest 10/11/2020   Elevated troponin    Chest pain 04/25/2020   NSTEMI (non-ST elevated myocardial infarction) (HCC) 04/25/2020   Hyperlipidemia 08/08/2019   Cerebrovascular disease 10/08/2018   REM sleep behavior disorder 10/08/2018   Weakness 04/10/2017   Dehydration 04/10/2017   Acute kidney injury superimposed on chronic kidney disease 04/10/2017   Normocytic normochromic anemia 04/10/2017   Chronic kidney disease (CKD), stage III (moderate) (HCC) 03/30/2017   HTN (hypertension), malignant 03/29/2017   Orthostatic hypotension 03/27/2017   Global aphasia 03/27/2017   Subdural hematoma (HCC) 03/26/2017   Acute pyloric channel ulcer 07/13/2014   Hypokalemia 07/13/2014   Acute upper GI bleed 07/13/2014  ARF (acute renal failure) 07/13/2014   Hypernatremia 07/13/2014   Hypotension 07/13/2014   Thrombocytopenia 07/13/2014   Helicobacter pylori gastritis 07/13/2014   Gastrointestinal hemorrhage with melena    Melena 07/11/2014   GI bleed 07/11/2014   Acute blood loss anemia 07/10/2014   Past Medical History:  Diagnosis Date   AAA (abdominal aortic aneurysm)    Acute pyloric channel ulcer 07/13/2014   Acute upper GI bleed 07/13/2014   Aortic atherosclerosis     Ascending aorta dilatation    4.3cm by echo 09/2022 and 3.9cm by Chest CT 03/2023.   Blood transfusion without reported diagnosis    CAD (coronary artery disease)    Cerebrovascular disease 10/08/2018   Chronic kidney disease, stage 3a (HCC) 03/30/2017   Dilated aortic root    38 mm by echo with 04/2021   Gastrointestinal hemorrhage with melena 06/2014   Global aphasia 03/27/2017   Helicobacter pylori gastritis 07/13/2014   High cholesterol    Hypernatremia 07/13/2014   Hypertension    Hypokalemia 07/13/2014   Myocardial infarction [facility])    mild heart attack in 2022   Normocytic normochromic anemia 04/10/2017   REM sleep behavior disorder 10/08/2018   Subdural hematoma (HCC) 03/26/2017   Thrombocytopenia 07/13/2014     Allergies as of 01/05/2024       Reactions   Doxazosin Mesylate Other (See Comments)   Unknown reaction- pt unsure        Medication List     TAKE these medications    aspirin  EC 81 MG tablet Commonly known as: Aspirin  Low Dose Take 1 tablet (81 mg total) by mouth daily.   isosorbide  mononitrate 30 MG 24 hr tablet Commonly known as: IMDUR  TAKE 1 TABLET(30 MG) BY MOUTH DAILY   metoprolol succinate 25 MG 24 hr tablet Commonly known as: Toprol XL Take 1 tablet (25 mg total) by mouth daily.   MULTIVITAMIN ADULTS 50+ PO Take 1 tablet by mouth daily.   nitroGLYCERIN  0.4 MG SL tablet Commonly known as: Nitrostat  Place 1 tablet (0.4 mg total) under the tongue every 5 (five) minutes as needed for chest pain.   rosuvastatin  40 MG tablet Commonly known as: CRESTOR  Take 1 tablet (40 mg total) by mouth daily.   traMADol 50 MG tablet Commonly known as: ULTRAM Take 1 tablet (50 mg total) by mouth every 6 (six) hours as needed for moderate pain (pain score 4-6).        Discharge Instructions:   [facility]  Discharge Instructions Endovascular Aortic Aneurysm Repair  Please refer to the following instructions for  your post-procedure care. Your surgeon or Physician Assistant will discuss any changes with you.  Activity  You are encouraged to walk as much as you can. You can slowly return to normal activities but must avoid strenuous activity and heavy lifting until your doctor tells you it's OK. Avoid activities such as vacuuming or swinging a gold club. It is normal to feel tired for several weeks after your surgery. Do not drive until your doctor gives the OK and you are no longer taking prescription pain medications. It is also normal to have difficulty with sleep habits, eating, and bowel movements after surgery. These will go away with time.  Bathing/Showering  You may shower after you go home. If you have an incision, do not soak in a bathtub, hot tub, or swim until the incision heals completely.  Incision Care  Shower every day. Clean your incision with mild soap  and water. Pat the area dry with a clean towel. You do not need a bandage unless otherwise instructed. Do not apply any ointments or creams to your incision. If you clothing is irritating, you may cover your incision with a dry gauze pad.  Diet  Resume your normal diet. There are no special food restrictions following this procedure. A low fat/low cholesterol diet is recommended for all patients with vascular disease. In order to heal from your surgery, it is CRITICAL to get adequate nutrition. Your body requires vitamins, minerals, and protein. Vegetables are the best source of vitamins and minerals. Vegetables also provide the perfect balance of protein. Processed food has little nutritional value, so try to avoid this.  Medications  Resume taking all of your medications unless your doctor or Physician Assistnat tells you not to. If your incision is causing pain, you may take over-the-counter pain relievers such as acetaminophen  (Tylenol ). If you were prescribed a stronger pain medication, please be aware these medications can cause  nausea and constipation. Prevent nausea by taking the medication with a snack or meal. Avoid constipation by drinking plenty of fluids and eating foods with a high amount of fiber, such as fruits, vegetables, and grains. Do not take Tylenol  if you are taking prescription pain medications.   Follow up  Our office will schedule a follow-up appointment with a C.T. scan 3-4 weeks after your surgery.  Please call us  immediately for any of the following conditions  Severe or worsening pain in your legs or feet or in your abdomen back or chest. Increased pain, redness, drainage (pus) from your incision sit. Increased abdominal pain, bloating, nausea, vomiting or persistent diarrhea. Fever of 101 degrees or higher. Swelling in your leg (s),  Reduce your risk of vascular disease  Stop smoking. If you would like help call [facility] at 1-800-QUIT-NOW ((289)109-0703) or [place] at ([phone]. Manage your cholesterol Maintain a desired weight Control your diabetes Keep your blood pressure down  If you have questions, please call the office at [phone].    Disposition: home  Patient's condition: is Good  Follow up: 1. Dr. Lanis in 4 weeks with CTA protocol   Donnice Sender, PA-C [facility] [phone] 01/06/2024  8:40 AM   - For VQI Registry use - Post-op:  Time to Extubation: [x]  In OR, [ ]  < 12 hrs, [ ]  12-24 hrs, [ ]  >=24 hrs Vasopressors Req. Post-op: No MI: No., [ ]  Troponin only, [ ]  EKG or Clinical New Arrhythmia: No CHF: No ICU Stay: 0 day in ICU Transfusion: No    Complications: Resp failure: No., [ ]  Pneumonia, [ ]  Ventilator Chg in renal function: No., [ ]  Inc. Cr > 0.5, [ ]  Temp. Dialysis,  [ ]  Permanent dialysis Leg ischemia: No., no Surgery needed, [ ]  Yes, Surgery needed,  [ ]  Amputation Bowel ischemia: No., [ ]  Medical Rx, [ ]  Surgical Rx Wound complication: No., [ ]  Superficial separation/infection, [ ]  Return to  OR Return to OR: No  Return to OR for bleeding: No Stroke: No., [ ]  Minor, [ ]  Major  Discharge medications: Statin use:  Yes  ASA use:  Yes  Plavix use:  No  Beta blocker use:  Yes  ARB use:  No ACEI use:  No CCB use:  No

## 2024-01-07 ENCOUNTER — Encounter (HOSPITAL_COMMUNITY): Payer: Self-pay | Admitting: Vascular Surgery

## 2024-01-07 ENCOUNTER — Ambulatory Visit

## 2024-01-09 ENCOUNTER — Other Ambulatory Visit: Payer: Self-pay

## 2024-01-09 ENCOUNTER — Ambulatory Visit: Admitting: Physical Therapy

## 2024-01-09 DIAGNOSIS — E1122 Type 2 diabetes mellitus with diabetic chronic kidney disease: Secondary | ICD-10-CM | POA: Insufficient documentation

## 2024-01-09 DIAGNOSIS — I7143 Infrarenal abdominal aortic aneurysm, without rupture: Secondary | ICD-10-CM

## 2024-01-09 DIAGNOSIS — N183 Chronic kidney disease, stage 3 unspecified: Secondary | ICD-10-CM | POA: Insufficient documentation

## 2024-01-09 DIAGNOSIS — I723 Aneurysm of iliac artery: Secondary | ICD-10-CM

## 2024-01-13 ENCOUNTER — Inpatient Hospital Stay (HOSPITAL_COMMUNITY)
Admission: EM | Admit: 2024-01-13 | Discharge: 2024-01-28 | DRG: 521 | Disposition: A | Discharge status: Skilled Nursing Facility | Attending: Emergency Medicine | Admitting: Emergency Medicine

## 2024-01-13 ENCOUNTER — Encounter (HOSPITAL_COMMUNITY): Payer: Self-pay | Admitting: Internal Medicine

## 2024-01-13 ENCOUNTER — Other Ambulatory Visit: Payer: Self-pay

## 2024-01-13 ENCOUNTER — Emergency Department (HOSPITAL_COMMUNITY)

## 2024-01-13 DIAGNOSIS — K7200 Acute and subacute hepatic failure without coma: Secondary | ICD-10-CM | POA: Diagnosis not present

## 2024-01-13 DIAGNOSIS — W19XXXA Unspecified fall, initial encounter: Secondary | ICD-10-CM | POA: Diagnosis not present

## 2024-01-13 DIAGNOSIS — N1831 Chronic kidney disease, stage 3a: Secondary | ICD-10-CM | POA: Diagnosis not present

## 2024-01-13 DIAGNOSIS — N179 Acute kidney failure, unspecified: Secondary | ICD-10-CM | POA: Diagnosis not present

## 2024-01-13 DIAGNOSIS — E7800 Pure hypercholesterolemia, unspecified: Secondary | ICD-10-CM | POA: Diagnosis present

## 2024-01-13 DIAGNOSIS — S72011A Unspecified intracapsular fracture of right femur, initial encounter for closed fracture: Secondary | ICD-10-CM | POA: Diagnosis not present

## 2024-01-13 DIAGNOSIS — I252 Old myocardial infarction: Secondary | ICD-10-CM

## 2024-01-13 DIAGNOSIS — R7989 Other specified abnormal findings of blood chemistry: Secondary | ICD-10-CM | POA: Diagnosis not present

## 2024-01-13 DIAGNOSIS — N400 Enlarged prostate without lower urinary tract symptoms: Secondary | ICD-10-CM | POA: Diagnosis present

## 2024-01-13 DIAGNOSIS — Z96641 Presence of right artificial hip joint: Secondary | ICD-10-CM | POA: Diagnosis not present

## 2024-01-13 DIAGNOSIS — R41841 Cognitive communication deficit: Secondary | ICD-10-CM | POA: Diagnosis not present

## 2024-01-13 DIAGNOSIS — I2510 Atherosclerotic heart disease of native coronary artery without angina pectoris: Secondary | ICD-10-CM | POA: Diagnosis present

## 2024-01-13 DIAGNOSIS — I69020 Aphasia following nontraumatic subarachnoid hemorrhage: Secondary | ICD-10-CM | POA: Diagnosis not present

## 2024-01-13 DIAGNOSIS — Z888 Allergy status to other drugs, medicaments and biological substances status: Secondary | ICD-10-CM

## 2024-01-13 DIAGNOSIS — I959 Hypotension, unspecified: Secondary | ICD-10-CM | POA: Diagnosis not present

## 2024-01-13 DIAGNOSIS — I517 Cardiomegaly: Secondary | ICD-10-CM | POA: Diagnosis not present

## 2024-01-13 DIAGNOSIS — S72041A Displaced fracture of base of neck of right femur, initial encounter for closed fracture: Secondary | ICD-10-CM | POA: Diagnosis not present

## 2024-01-13 DIAGNOSIS — E876 Hypokalemia: Secondary | ICD-10-CM | POA: Diagnosis not present

## 2024-01-13 DIAGNOSIS — E1122 Type 2 diabetes mellitus with diabetic chronic kidney disease: Secondary | ICD-10-CM | POA: Diagnosis present

## 2024-01-13 DIAGNOSIS — Z23 Encounter for immunization: Secondary | ICD-10-CM | POA: Diagnosis not present

## 2024-01-13 DIAGNOSIS — E43 Unspecified severe protein-calorie malnutrition: Secondary | ICD-10-CM | POA: Diagnosis present

## 2024-01-13 DIAGNOSIS — W1830XA Fall on same level, unspecified, initial encounter: Secondary | ICD-10-CM | POA: Diagnosis present

## 2024-01-13 DIAGNOSIS — Z79899 Other long term (current) drug therapy: Secondary | ICD-10-CM

## 2024-01-13 DIAGNOSIS — G9341 Metabolic encephalopathy: Secondary | ICD-10-CM | POA: Diagnosis not present

## 2024-01-13 DIAGNOSIS — R2681 Unsteadiness on feet: Secondary | ICD-10-CM | POA: Diagnosis not present

## 2024-01-13 DIAGNOSIS — Z8673 Personal history of transient ischemic attack (TIA), and cerebral infarction without residual deficits: Secondary | ICD-10-CM

## 2024-01-13 DIAGNOSIS — M8580 Other specified disorders of bone density and structure, unspecified site: Secondary | ICD-10-CM | POA: Diagnosis not present

## 2024-01-13 DIAGNOSIS — G928 Other toxic encephalopathy: Secondary | ICD-10-CM

## 2024-01-13 DIAGNOSIS — E875 Hyperkalemia: Secondary | ICD-10-CM | POA: Diagnosis present

## 2024-01-13 DIAGNOSIS — R569 Unspecified convulsions: Secondary | ICD-10-CM | POA: Diagnosis not present

## 2024-01-13 DIAGNOSIS — Z471 Aftercare following joint replacement surgery: Secondary | ICD-10-CM | POA: Diagnosis not present

## 2024-01-13 DIAGNOSIS — Z87891 Personal history of nicotine dependence: Secondary | ICD-10-CM

## 2024-01-13 DIAGNOSIS — D631 Anemia in chronic kidney disease: Secondary | ICD-10-CM | POA: Diagnosis present

## 2024-01-13 DIAGNOSIS — R1310 Dysphagia, unspecified: Secondary | ICD-10-CM | POA: Diagnosis not present

## 2024-01-13 DIAGNOSIS — S72001S Fracture of unspecified part of neck of right femur, sequela: Secondary | ICD-10-CM | POA: Diagnosis not present

## 2024-01-13 DIAGNOSIS — I69391 Dysphagia following cerebral infarction: Secondary | ICD-10-CM | POA: Diagnosis not present

## 2024-01-13 DIAGNOSIS — I700 Atherosclerosis of aorta: Secondary | ICD-10-CM | POA: Diagnosis present

## 2024-01-13 DIAGNOSIS — S72001A Fracture of unspecified part of neck of right femur, initial encounter for closed fracture: Secondary | ICD-10-CM | POA: Diagnosis not present

## 2024-01-13 DIAGNOSIS — Z781 Physical restraint status: Secondary | ICD-10-CM

## 2024-01-13 DIAGNOSIS — I10 Essential (primary) hypertension: Secondary | ICD-10-CM | POA: Diagnosis not present

## 2024-01-13 DIAGNOSIS — E8809 Other disorders of plasma-protein metabolism, not elsewhere classified: Secondary | ICD-10-CM | POA: Diagnosis present

## 2024-01-13 DIAGNOSIS — E871 Hypo-osmolality and hyponatremia: Secondary | ICD-10-CM | POA: Diagnosis present

## 2024-01-13 DIAGNOSIS — Z7982 Long term (current) use of aspirin: Secondary | ICD-10-CM

## 2024-01-13 DIAGNOSIS — Z681 Body mass index (BMI) 19 or less, adult: Secondary | ICD-10-CM | POA: Diagnosis not present

## 2024-01-13 DIAGNOSIS — Z7401 Bed confinement status: Secondary | ICD-10-CM | POA: Diagnosis not present

## 2024-01-13 DIAGNOSIS — R410 Disorientation, unspecified: Secondary | ICD-10-CM | POA: Diagnosis not present

## 2024-01-13 DIAGNOSIS — R4182 Altered mental status, unspecified: Secondary | ICD-10-CM | POA: Diagnosis not present

## 2024-01-13 DIAGNOSIS — R6510 Systemic inflammatory response syndrome (SIRS) of non-infectious origin without acute organ dysfunction: Secondary | ICD-10-CM | POA: Diagnosis not present

## 2024-01-13 DIAGNOSIS — R4701 Aphasia: Secondary | ICD-10-CM | POA: Diagnosis not present

## 2024-01-13 DIAGNOSIS — N183 Chronic kidney disease, stage 3 unspecified: Secondary | ICD-10-CM | POA: Diagnosis not present

## 2024-01-13 DIAGNOSIS — R1311 Dysphagia, oral phase: Secondary | ICD-10-CM | POA: Diagnosis not present

## 2024-01-13 DIAGNOSIS — I129 Hypertensive chronic kidney disease with stage 1 through stage 4 chronic kidney disease, or unspecified chronic kidney disease: Secondary | ICD-10-CM | POA: Diagnosis not present

## 2024-01-13 DIAGNOSIS — E785 Hyperlipidemia, unspecified: Secondary | ICD-10-CM | POA: Diagnosis not present

## 2024-01-13 DIAGNOSIS — N289 Disorder of kidney and ureter, unspecified: Secondary | ICD-10-CM | POA: Diagnosis not present

## 2024-01-13 DIAGNOSIS — R531 Weakness: Secondary | ICD-10-CM | POA: Diagnosis not present

## 2024-01-13 DIAGNOSIS — Z9181 History of falling: Secondary | ICD-10-CM | POA: Diagnosis not present

## 2024-01-13 DIAGNOSIS — K7460 Unspecified cirrhosis of liver: Secondary | ICD-10-CM | POA: Diagnosis not present

## 2024-01-13 DIAGNOSIS — Z8249 Family history of ischemic heart disease and other diseases of the circulatory system: Secondary | ICD-10-CM

## 2024-01-13 DIAGNOSIS — Z4682 Encounter for fitting and adjustment of non-vascular catheter: Secondary | ICD-10-CM | POA: Diagnosis not present

## 2024-01-13 DIAGNOSIS — N281 Cyst of kidney, acquired: Secondary | ICD-10-CM | POA: Diagnosis not present

## 2024-01-13 DIAGNOSIS — Z8679 Personal history of other diseases of the circulatory system: Secondary | ICD-10-CM

## 2024-01-13 DIAGNOSIS — N1832 Chronic kidney disease, stage 3b: Secondary | ICD-10-CM | POA: Diagnosis not present

## 2024-01-13 DIAGNOSIS — E869 Volume depletion, unspecified: Secondary | ICD-10-CM | POA: Diagnosis not present

## 2024-01-13 DIAGNOSIS — R262 Difficulty in walking, not elsewhere classified: Secondary | ICD-10-CM | POA: Diagnosis not present

## 2024-01-13 DIAGNOSIS — K7689 Other specified diseases of liver: Secondary | ICD-10-CM | POA: Diagnosis not present

## 2024-01-13 DIAGNOSIS — G4752 REM sleep behavior disorder: Secondary | ICD-10-CM | POA: Diagnosis present

## 2024-01-13 DIAGNOSIS — R0682 Tachypnea, not elsewhere classified: Secondary | ICD-10-CM | POA: Diagnosis not present

## 2024-01-13 DIAGNOSIS — D649 Anemia, unspecified: Secondary | ICD-10-CM | POA: Diagnosis not present

## 2024-01-13 DIAGNOSIS — R945 Abnormal results of liver function studies: Secondary | ICD-10-CM | POA: Diagnosis not present

## 2024-01-13 DIAGNOSIS — M6281 Muscle weakness (generalized): Secondary | ICD-10-CM | POA: Diagnosis not present

## 2024-01-13 DIAGNOSIS — I714 Abdominal aortic aneurysm, without rupture, unspecified: Secondary | ICD-10-CM | POA: Diagnosis not present

## 2024-01-13 DIAGNOSIS — R509 Fever, unspecified: Secondary | ICD-10-CM | POA: Diagnosis not present

## 2024-01-13 LAB — CBC
HCT: 29.7 % — ABNORMAL LOW (ref 39.0–52.0)
Hemoglobin: 9.6 g/dL — ABNORMAL LOW (ref 13.0–17.0)
MCH: 25.6 pg — ABNORMAL LOW (ref 26.0–34.0)
MCHC: 32.3 g/dL (ref 30.0–36.0)
MCV: 79.2 fL — ABNORMAL LOW (ref 80.0–100.0)
Platelets: 225 K/uL (ref 150–400)
RBC: 3.75 MIL/uL — ABNORMAL LOW (ref 4.22–5.81)
RDW: 16.7 % — ABNORMAL HIGH (ref 11.5–15.5)
WBC: 6.8 K/uL (ref 4.0–10.5)
nRBC: 0 % (ref 0.0–0.2)

## 2024-01-13 LAB — TYPE AND SCREEN
ABO/RH(D): O POS
ABO/RH(D): O POS
Antibody Screen: NEGATIVE
Antibody Screen: NEGATIVE

## 2024-01-13 LAB — HEPATIC FUNCTION PANEL
ALT: 76 U/L — ABNORMAL HIGH (ref 0–44)
AST: 103 U/L — ABNORMAL HIGH (ref 15–41)
Albumin: 2.3 g/dL — ABNORMAL LOW (ref 3.5–5.0)
Alkaline Phosphatase: 91 U/L (ref 38–126)
Bilirubin, Direct: 0.2 mg/dL (ref 0.0–0.2)
Indirect Bilirubin: 0.7 mg/dL (ref 0.3–0.9)
Total Bilirubin: 0.9 mg/dL (ref 0.0–1.2)
Total Protein: 6.7 g/dL (ref 6.5–8.1)

## 2024-01-13 LAB — BASIC METABOLIC PANEL WITH GFR
Anion gap: 12 (ref 5–15)
BUN: 13 mg/dL (ref 8–23)
CO2: 26 mmol/L (ref 22–32)
Calcium: 8.6 mg/dL — ABNORMAL LOW (ref 8.9–10.3)
Chloride: 96 mmol/L — ABNORMAL LOW (ref 98–111)
Creatinine, Ser: 1.46 mg/dL — ABNORMAL HIGH (ref 0.61–1.24)
GFR, Estimated: 47 mL/min — ABNORMAL LOW (ref >60)
Glucose, Bld: 200 mg/dL — ABNORMAL HIGH (ref 70–99)
Potassium: 2.7 mmol/L — CL (ref 3.5–5.1)
Sodium: 134 mmol/L — ABNORMAL LOW (ref 135–145)

## 2024-01-13 LAB — MAGNESIUM: Magnesium: 2.2 mg/dL (ref 1.7–2.4)

## 2024-01-13 LAB — HEMOGLOBIN A1C
Hgb A1c MFr Bld: 6 % — ABNORMAL HIGH (ref 4.8–5.6)
Mean Plasma Glucose: 125.5 mg/dL

## 2024-01-13 LAB — I-STAT CG4 LACTIC ACID, ED: Lactic Acid, Venous: 1 mmol/L (ref 0.5–1.9)

## 2024-01-13 MED ORDER — MORPHINE SULFATE (PF) 2 MG/ML IV SOLN
2.0000 mg | INTRAVENOUS | Status: DC | PRN
  Administered 2024-01-14: 2 mg via INTRAVENOUS
  Filled 2024-01-13 (×2): qty 1

## 2024-01-13 MED ORDER — PNEUMOCOCCAL 20-VAL CONJ VACC 0.5 ML IM SUSY
0.5000 mL | PREFILLED_SYRINGE | INTRAMUSCULAR | Status: DC
  Filled 2024-01-13: qty 0.5

## 2024-01-13 MED ORDER — HYDROCODONE-ACETAMINOPHEN 5-325 MG PO TABS
1.0000 | ORAL_TABLET | ORAL | Status: DC | PRN
  Administered 2024-01-16: 2 via ORAL
  Filled 2024-01-13: qty 2

## 2024-01-13 MED ORDER — METHOCARBAMOL 500 MG PO TABS: 500.0000 mg | ORAL_TABLET | Freq: Four times a day (QID) | ORAL | Status: DC | PRN

## 2024-01-13 MED ORDER — POLYETHYLENE GLYCOL 3350 17 G PO PACK
17.0000 g | PACK | Freq: Every day | ORAL | Status: DC | PRN
  Administered 2024-01-17: 17 g via ORAL
  Filled 2024-01-13: qty 1

## 2024-01-13 MED ORDER — BISACODYL 5 MG PO TBEC: 5.0000 mg | DELAYED_RELEASE_TABLET | Freq: Every day | ORAL | Status: DC | PRN

## 2024-01-13 MED ORDER — TRANEXAMIC ACID-NACL 1000-0.7 MG/100ML-% IV SOLN
1000.0000 mg | INTRAVENOUS | Status: AC
  Filled 2024-01-13 (×2): qty 100

## 2024-01-13 MED ORDER — POTASSIUM CHLORIDE CRYS ER 20 MEQ PO TBCR
40.0000 meq | EXTENDED_RELEASE_TABLET | Freq: Once | ORAL | Status: AC
  Administered 2024-01-13: 40 meq via ORAL
  Filled 2024-01-13: qty 2

## 2024-01-13 MED ORDER — POTASSIUM CHLORIDE 10 MEQ/100ML IV SOLN
10.0000 meq | Freq: Once | INTRAVENOUS | Status: AC
Start: 2024-01-13 — End: 2024-01-13
  Administered 2024-01-13: 10 meq via INTRAVENOUS
  Filled 2024-01-13: qty 100

## 2024-01-13 MED ORDER — HYDROCODONE-ACETAMINOPHEN 5-325 MG PO TABS: 1.0000 | ORAL_TABLET | Freq: Four times a day (QID) | ORAL | Status: DC | PRN

## 2024-01-13 MED ORDER — ISOSORBIDE MONONITRATE ER 30 MG PO TB24
30.0000 mg | ORAL_TABLET | Freq: Every day | ORAL | Status: DC
  Administered 2024-01-14 – 2024-01-28 (×12): 30 mg via ORAL
  Filled 2024-01-13 (×13): qty 1

## 2024-01-13 MED ORDER — HYDRALAZINE HCL 20 MG/ML IJ SOLN
10.0000 mg | Freq: Four times a day (QID) | INTRAMUSCULAR | Status: DC | PRN
  Administered 2024-01-13: 10 mg via INTRAVENOUS
  Filled 2024-01-13: qty 1

## 2024-01-13 MED ORDER — HYDRALAZINE HCL 20 MG/ML IJ SOLN
10.0000 mg | INTRAMUSCULAR | Status: DC | PRN
  Administered 2024-01-14 – 2024-01-16 (×2): 10 mg via INTRAVENOUS
  Filled 2024-01-13 (×2): qty 1

## 2024-01-13 MED ORDER — CEFAZOLIN SODIUM-DEXTROSE 2-4 GM/100ML-% IV SOLN
2.0000 g | INTRAVENOUS | Status: AC
  Administered 2024-01-14: 2 g via INTRAVENOUS
  Filled 2024-01-13: qty 100

## 2024-01-13 MED ORDER — METHOCARBAMOL 1000 MG/10ML IJ SOLN
500.0000 mg | Freq: Four times a day (QID) | INTRAMUSCULAR | Status: DC | PRN
  Administered 2024-01-16: 500 mg via INTRAVENOUS
  Filled 2024-01-13: qty 10

## 2024-01-13 MED ORDER — MAGNESIUM SULFATE 2 GM/50ML IV SOLN
2.0000 g | Freq: Once | INTRAVENOUS | Status: AC
  Administered 2024-01-13: 2 g via INTRAVENOUS
  Filled 2024-01-13: qty 50

## 2024-01-13 MED ORDER — MORPHINE SULFATE (PF) 2 MG/ML IV SOLN
2.0000 mg | INTRAVENOUS | Status: DC | PRN
  Filled 2024-01-13: qty 1

## 2024-01-13 MED ORDER — METOPROLOL SUCCINATE ER 25 MG PO TB24
25.0000 mg | ORAL_TABLET | Freq: Every day | ORAL | Status: DC
  Administered 2024-01-13 – 2024-01-26 (×11): 25 mg via ORAL
  Filled 2024-01-13 (×12): qty 1

## 2024-01-13 MED ORDER — INFLUENZA VAC SPLIT HIGH-DOSE 0.5 ML IM SUSY
0.5000 mL | PREFILLED_SYRINGE | INTRAMUSCULAR | Status: DC
  Filled 2024-01-13: qty 0.5

## 2024-01-13 MED ORDER — LACTATED RINGERS IV BOLUS
500.0000 mL | Freq: Once | INTRAVENOUS | Status: AC
  Administered 2024-01-13: 500 mL via INTRAVENOUS

## 2024-01-13 MED ORDER — ROSUVASTATIN CALCIUM 20 MG PO TABS
40.0000 mg | ORAL_TABLET | Freq: Every day | ORAL | Status: DC
  Administered 2024-01-13 – 2024-01-16 (×3): 40 mg via ORAL
  Filled 2024-01-13 (×4): qty 2

## 2024-01-13 NOTE — Hospital Course (Signed)
       PTA meds include aspirin  Imdur  metoprolol rosuvastatin  and tramadol. Allergies include doxazosin.

## 2024-01-13 NOTE — ED Triage Notes (Signed)
 Pt bib [facility] from home for a mechanical fall a week ago Saturday. Pt has been unable to get up from bed, walk, move since falling due to right hip pain. Pt says he did bump his head, no thinners. Hx of htn and has not taken medications.

## 2024-01-13 NOTE — Consult Note (Signed)
 Pt with femoral neck fracture, for surgical hemiarthroplasty tomorrow. Discussed femoral nerve block for analgesia tonight, pre-surgery, with pt and family.  Pt says he is comfortable presently, and declines. Please contact anesthesiologist on call if pain escalates and pt reconsiders.   JAYSON Mace, MD [facility] [phone]

## 2024-01-13 NOTE — Consult Note (Addendum)
 Orthopedic Surgery Consult Note  Assessment: Patient is a 88 y.o. male with right femoral neck fracture   Plan: -Planning for surgery tomorrow evening -Diet: NPO at midnight -DVT ppx: aspirin  81mg  BID postoperatively -Ancef  and TXA on call to OR -Weight bearing status: NWB RLE -PT evaluate and treat postoperatively -Pain control -Dispo: pending completion of operative plans   Discussed recommendation for operative intervention in the form of right hip cemented hemiarthroplasty. Explained the risks of this procedure included, but were not limited to: leg length discrepancy, dislocation, sciatic nerve injury, vascular injury, periprosthetic fracture, infection, bleeding, stiffness, need for additional procedures, deep vein thrombosis, pulmonary embolism, and death. The benefits of this procedure would be to promote fracture healing by providing stability and to heal the fracture in the appropriate alignment. The alternatives of this surgery would be to treat the fracture with immobilization in a splint/brace/cast or to do no intervention. The patient's questions were answered to their satisfaction. After this discussion, patient elected to proceed with surgery. Informed consent was obtained.   ___________________________________________________________________________   Reason for consult: right femoral neck fracture at home and was unable to ambulate.  He was brought into the emergency department at [facility] today with right hip pain.  Workup revealed a right femoral neck fracture.  He was admitted to the medicine service.  Orthopedics was consulted.  He is reporting right hip pain.  No pain elsewhere.  History:  Patient is a 88 y.o. male who had a ground-level fall  Review of systems: General: denies fevers and chills, myalgias Neurologic: denies recent changes in vision, slurred speech Abdomen: denies nausea, vomiting, hematemesis Respiratory: denies cough, shortness of breath  Past  medical history:  AAA Gastric ulcer CAD Cerebrovascular disease CKD HTN HLD History of MI Chronic anemia  Allergies: doxazosin   Past surgical history:  Craniotomy Vascular access surgery Aortic stent graft  Social history: Quit use of nicotine-containing products (cigarettes, vaping, smokeless, etc.) Alcohol use: denies Denies use of recreational drugs   Physical Exam:  General: no acute distress, appears stated age Neurologic: alert, answering questions appropriately, following commands Cardiovascular: regular rate, no cyanosis Respiratory: unlabored breathing on room air, symmetric chest rise Psychiatric: appropriate affect, normal cadence to speech  MSK:   -Bilateral upper extremities  No tenderness to palpation over extremity, no gross deformity, no open wounds Fires deltoid, biceps, triceps, wrist extensors, wrist flexors, finger extensors, finger flexors  AIN/PIN/IO intact  Palpable radial pulse  Sensation intact to light touch in median/ulnar/radial/axillary nerve distributions  Hand warm and well perfused  - Left lower extremity  No tenderness to palpation over extremity, no gross deformity, no open wounds, no pain with logroll Fires hip flexors, quadriceps, hamstrings, tibialis anterior, gastrocnemius and soleus, extensor hallucis longus Plantarflexes and dorsiflexes toes Sensation intact to light touch in sural, saphenous, tibial, deep peroneal, and superficial peroneal nerve distributions Foot warm and well perfused  -Right lower extremity  Leg shorter when compared to contralateral side, pain with logroll, tender to palpation over the the hip - no TTP over the remainder of the extremity Does not fire hip flexors due to pain.  Fires quadriceps, hamstrings, tibialis anterior, gastrocnemius and soleus, extensor hallucis longus Plantarflexes and dorsiflexes toes Sensation intact to light touch in sural, saphenous, tibial, deep peroneal, and superficial  peroneal nerve distributions Foot warm and well perfused  Imaging: XRs of the right hip and pelvis from 01/13/2024 were independently reviewed and interpreted, showing a displaced subcapital femoral neck fracture.  Fracture alignment in  varus.  No other fracture seen.  No dislocation seen.   Patient name: Juan Goodwin Patient MRN: [id number] Date: 01/13/24

## 2024-01-13 NOTE — Plan of Care (Signed)
   Problem: Safety: Goal: Ability to remain free from injury will improve Outcome: Progressing

## 2024-01-13 NOTE — ED Provider Notes (Signed)
 [place] EMERGENCY DEPARTMENT AT [facility] Provider Note   CSN: [id number] Arrival date & time: 01/13/24  1259     Patient presents with: Juan Goodwin is a 88 y.o. male.   Patient with history of AAA, CAD, CKD, hypertension, MI presents today with complaints of fall. Reports that same occurred in the night on Friday. Reports that he went to the bathroom and lost his balance and fell on his right hip. Reports that he does think that he may have lightly bumped his head but denies LOC. He is not anticoagulated. Reports pain to his right hip since then. Reports that he lives at home with his daughter who lifted him and put him in bed that night. Reports that he used to walk unassisted prior to the fall but is now unable to get out of bed due to severe pain in his right hip. Denies any other injuries or complaints.   The history is provided by the patient. No language interpreter was used.  Fall       Prior to Admission medications   Medication Sig Start Date End Date Taking? Authorizing Provider  aspirin  EC (ASPIRIN  LOW DOSE) 81 MG tablet Take 1 tablet (81 mg total) by mouth daily. 11/03/22   Shlomo Wilbert SAUNDERS, MD  isosorbide  mononitrate (IMDUR ) 30 MG 24 hr tablet TAKE 1 TABLET(30 MG) BY MOUTH DAILY 07/07/22   Shlomo Wilbert SAUNDERS, MD  metoprolol succinate (TOPROL XL) 25 MG 24 hr tablet Take 1 tablet (25 mg total) by mouth daily. 12/11/23   Walker, Caitlin S, NP  Multiple Vitamins-Minerals (MULTIVITAMIN ADULTS 50+ PO) Take 1 tablet by mouth daily.    [provider]  nitroGLYCERIN  (NITROSTAT ) 0.4 MG SL tablet Place 1 tablet (0.4 mg total) under the tongue every 5 (five) minutes as needed for chest pain. 09/11/22   Shlomo Wilbert SAUNDERS, MD  rosuvastatin  (CRESTOR ) 40 MG tablet Take 1 tablet (40 mg total) by mouth daily. 05/10/20   Shlomo Wilbert SAUNDERS, MD  traMADol (ULTRAM) 50 MG tablet Take 1 tablet (50 mg total) by mouth every 6 (six) hours as needed for moderate pain (pain score  4-6). 01/05/24   Bethanie Cough, PA-C    Allergies: Doxazosin mesylate    Review of Systems  Musculoskeletal:  Positive for arthralgias.  All other systems reviewed and are negative.   Updated Vital Signs BP (!) 156/107   Pulse 79   Temp 98.7 F (37.1 C)   Resp 16   SpO2 100%   Physical Exam Vitals and nursing note reviewed.  Constitutional:      General: He is not in acute distress.    Appearance: Normal appearance. He is normal weight. He is not ill-appearing, toxic-appearing or diaphoretic.  HENT:     Head: Normocephalic and atraumatic.     Comments: No racoon eyes No battle sign Eyes:     Extraocular Movements: Extraocular movements intact.     Pupils: Pupils are equal, round, and reactive to light.  Cardiovascular:     Rate and Rhythm: Normal rate.     Comments: No tenderness to palpation of the anterior chest wall Pulmonary:     Effort: Pulmonary effort is normal. No respiratory distress.  Abdominal:     Comments: No abdominal tenderness or bruising  Musculoskeletal:        General: Normal range of motion.     Cervical back: Normal and normal range of motion.     Thoracic back: Normal.  Lumbar back: Normal.     Comments: No midline tenderness, no stepoffs or deformity noted on palpation of cervical, thoracic, and lumbar spine  TTP noted to the right femoral head area without significant bruising or deformity.  No external rotation or shortening.  DP and PT pulses palpable.  Skin:    General: Skin is warm and dry.  Neurological:     General: No focal deficit present.     Mental Status: He is alert and oriented to person, place, and time.  Psychiatric:        Mood and Affect: Mood normal.        Behavior: Behavior normal.     (all labs ordered are listed, but only abnormal results are displayed) Labs Reviewed  CBC - Abnormal; Notable for the following components:      Result Value   RBC 3.75 (*)    Hemoglobin 9.6 (*)    HCT 29.7 (*)    MCV 79.2  (*)    MCH 25.6 (*)    RDW 16.7 (*)    All other components within normal limits  BASIC METABOLIC PANEL WITH GFR    EKG: None  Radiology: DG Hip Unilat W or Wo Pelvis 2-3 Views Right Result Date: 01/13/2024 CLINICAL DATA:  Juan Goodwin last week, inability to ambulate, right hip pain EXAM: DG HIP (WITH OR WITHOUT PELVIS) 2-3V RIGHT COMPARISON:  11/29/2023 FINDINGS: Frontal view of the pelvis as well as frontal and frogleg lateral views of the right hip are obtained. There is an impacted subcapital right femoral neck fracture with varus angulation at the fracture site. Left hip is unremarkable. Remainder of the bony pelvis appears normal. Embolic coils left hemipelvis. And luminal stent graft within the abdominal aorta and iliac vessels. IMPRESSION: 1. Acute subcapital right femoral neck fracture with impaction and varus angulation. Electronically Signed   By: Ozell Daring M.D.   On: 01/13/2024 14:12     .Critical Care  Performed by: Nora Lauraine LABOR, PA-C Authorized by: Tristina Sahagian A, PA-C   Critical care provider statement:    Critical care time (minutes):  35   Critical care was necessary to treat or prevent imminent or life-threatening deterioration of the following conditions: critical hypokalemia requiring IV replacement.   Critical care was time spent personally by me on the following activities:  Development of treatment plan with patient or surrogate, discussions with consultants, discussions with primary provider, evaluation of patient's response to treatment, obtaining history from patient or surrogate, ordering and review of laboratory studies, ordering and review of radiographic studies, pulse oximetry, re-evaluation of patient's condition and review of old charts   Care discussed with: admitting provider      Medications Ordered in the ED - No data to display                                  Medical Decision Making Amount and/or Complexity of Data Reviewed Labs:  ordered. Radiology: ordered.   This patient is a 88 y.o. male who presents to the ED for concern of fall, right hip pain, this involves an extensive number of treatment options, and is a complaint that carries with it a high risk of complications and morbidity. The emergent differential diagnosis prior to evaluation includes, but is not limited to,  trauma, fracture, dislocation . This is not an exhaustive differential.   Past Medical History / Co-morbidities / Social History:  has  a past medical history of AAA (abdominal aortic aneurysm), Acute pyloric channel ulcer (07/13/2014), Acute upper GI bleed (07/13/2014), Aortic atherosclerosis, Ascending aorta dilatation, Blood transfusion without reported diagnosis, CAD (coronary artery disease), Cerebrovascular disease (10/08/2018), Chronic kidney disease, stage 3a (HCC) (03/30/2017), Dilated aortic root, Gastrointestinal hemorrhage with melena (06/2014), Global aphasia (03/27/2017), Helicobacter pylori gastritis (07/13/2014), High cholesterol, Hypernatremia (07/13/2014), Hypertension, Hypokalemia (07/13/2014), Myocardial infarction (HCC), Normocytic normochromic anemia (04/10/2017), REM sleep behavior disorder (10/08/2018), Subdural hematoma (HCC) (03/26/2017), and Thrombocytopenia (07/13/2014).  Additional history: Chart reviewed. Pertinent results include: just discharged from the hospital on 10/25, had his AAA surgically repaired  Physical Exam: Physical exam performed. The pertinent findings include: TTP noted to the right femoral head area without significant bruising or deformity.  No external rotation or shortening.  DP and PT pulses palpable.  Lab Tests: I ordered, and personally interpreted labs.  The pertinent results include:  hgb 9.6 consistent with previous, K 2.7 (hx of same, no potassium clearing meds, no signs or symptoms of GI loss, does appear somewhat malnourished, could be poor intake)   Imaging Studies: I ordered imaging studies  including DG right hip. I independently visualized and interpreted imaging which showed   1. Acute subcapital right femoral neck fracture with impaction and varus angulation.  I agree with the radiologist interpretation.   Cardiac Monitoring:  The patient was maintained on a cardiac monitor.  My attending physician viewed and interpreted the cardiac monitored which showed an underlying rhythm of: sinus rhythm, no STEMI. I agree with this interpretation.   Medications: I ordered medication including oral and IV potassium, IV magnesium ,  for hypokalemia. Reevaluation of the patient after these medicines showed that the patient stayed the same. I have reviewed the patients home medicines and have made adjustments as needed.  Consultations Obtained: I requested consultation with the orthopedic surgery on call Dr. Georgina,  and discussed lab and imaging findings as well as pertinent plan - they recommend: NPO at midnight, plan for operative management of hip fracture tomorrow   Disposition: After consideration of the diagnostic results and the patients response to treatment, I feel that patient will require admission for further management of his hip fracture.  Discussed patient with hospitalist Dr. Tobie who accepts patient for admission.   I discussed this case with my attending physician Dr. Levander who cosigned this note including patient's presenting symptoms, physical exam, and planned diagnostics and interventions. Attending physician stated agreement with plan or made changes to plan which were implemented.    Final diagnoses:  Closed displaced fracture of right femoral neck [facility])  Hypokalemia    ED Discharge Orders     None          Nora Lauraine DELENA DEVONNA 01/13/24 1605    Levander Houston, MD 01/15/24 [phone]

## 2024-01-13 NOTE — H&P (Signed)
 History and Physical    Patient: Juan Goodwin FMW:[id number] DOB: 1936/01/27 DOA: 01/13/2024 DOS: the patient was seen and examined on 01/13/2024 . PCP: [name], MD  Patient coming from: Home Chief complaint: Chief Complaint  Patient presents with   Fall   HPI:  Juan Goodwin is a 88 y.o. male with past medical history  of  AAA, CAD, CKD, history of subdural hematoma, duodenal ulcer/history of GI bleed, CKD stage IIIa, CAD, hyperlipidemia, essential hypertension, MI followed by cardiology presenting today with a fall that happened last night when he went to use the bathroom and fell, daughter helped him up back in bed.  And for this he was independent and ambulatory.  Patient fell on his right side and has right hip pain. He does not report any dizziness or hypovolemia symptoms.   ED Course:  Vital signs in the ED were notable for the following:  Vitals:   01/13/24 1648 01/13/24 1757 01/13/24 1758 01/13/24 1900  BP:  (!) 203/114 (!) 211/121 (!) 189/107  Pulse:  86 79   Temp: 98.6 F (37 C)  98.3 F (36.8 C)   Resp:      SpO2:  92%    TempSrc: Oral  Oral    >>ED evaluation thus far shows: Labs shows: - CMP shows hypokalemia 2.7, glucose of 200, CKD stage III AA with a creatinine of 1.46 eGFR 47.  LFTs ordered as add-on. -CBC shows anemia with a hemoglobin of 9.6 MCV 79.2 RDW 16.7 platelets 225 normal white count, anemia has been chronic since 2016 currently stable will defer to PCP to further manage upon discharge. Will type and screen and monitor CBC. - Xray shows right femoral neck fracture.  Mechanical fracture.  No LOC.  - EKG shows sinus rhythm at 79 PR 173 QRS 112 QTc 493 nonspecific T wave abnormalities in inferior lead.  >>While in the ED patient received the following: Medications  potassium chloride  10 mEq in 100 mL IVPB (10 mEq Intravenous New Bag/Given 01/13/24 1513)  magnesium  sulfate IVPB 2 g 50 mL (2 g Intravenous New Bag/Given 01/13/24 1514)   potassium chloride  SA (KLOR-CON  M) CR tablet 40 mEq (40 mEq Oral Given 01/13/24 1514)  lactated ringers bolus 500 mL (500 mLs Intravenous New Bag/Given 01/13/24 1501)   Review of Systems  Musculoskeletal:  Positive for falls and joint pain.   Past Medical History:  Diagnosis Date   AAA (abdominal aortic aneurysm)    Acute pyloric channel ulcer 07/13/2014   Acute upper GI bleed 07/13/2014   Aortic atherosclerosis    Ascending aorta dilatation    4.3cm by echo 09/2022 and 3.9cm by Chest CT 03/2023.   Blood transfusion without reported diagnosis    CAD (coronary artery disease)    Cerebrovascular disease 10/08/2018   Chronic kidney disease, stage 3a (HCC) 03/30/2017   Dilated aortic root    38 mm by echo with 04/2021   Gastrointestinal hemorrhage with melena 06/2014   Global aphasia 03/27/2017   Helicobacter pylori gastritis 07/13/2014   High cholesterol    Hypernatremia 07/13/2014   Hypertension    Hypokalemia 07/13/2014   Myocardial infarction [facility])    mild heart attack in 2022   Normocytic normochromic anemia 04/10/2017   REM sleep behavior disorder 10/08/2018   Subdural hematoma (HCC) 03/26/2017   Thrombocytopenia 07/13/2014   Past Surgical History:  Procedure Laterality Date   ABDOMINAL AORTIC ENDOVASCULAR STENT GRAFT Bilateral 01/04/2024   Procedure: INSERTION, ENDOVASCULAR STENT GRAFT, AORTA,  ABDOMINAL; COIL EMBOLIZATION OF LEFT INTERNAL ILIAC ARTERY ANEURYSM;  Surgeon: Lanis Fonda BRAVO, MD;  Location: [facility] OR;  Service: Vascular;  Laterality: Bilateral;   BRAIN SURGERY     CRANIOTOMY Left 03/26/2017   Procedure: CRANIOTOMY HEMATOMA EVACUATION SUBDURAL;  Surgeon: Onetha Kuba, MD;  Location: [facility] OR;  Service: Neurosurgery;  Laterality: Left;   ESOPHAGOGASTRODUODENOSCOPY N/A 07/11/2014   Procedure: ESOPHAGOGASTRODUODENOSCOPY (EGD);  Surgeon: Jerrell Sol, MD;  Location: [facility];  Service: Endoscopy;  Laterality: N/A;   LEFT HEART CATH AND CORONARY ANGIOGRAPHY N/A  04/26/2020   Procedure: LEFT HEART CATH AND CORONARY ANGIOGRAPHY;  Surgeon: Dann Candyce RAMAN, MD;  Location: [facility] INVASIVE CV LAB;  Service: Cardiovascular;  Laterality: N/A;   ULTRASOUND GUIDANCE FOR VASCULAR ACCESS Bilateral 01/04/2024   Procedure: ULTRASOUND GUIDANCE, FOR VASCULAR ACCESS;  Surgeon: Lanis Fonda BRAVO, MD;  Location: [facility] OR;  Service: Vascular;  Laterality: Bilateral;    reports that he has quit smoking. His smoking use included cigarettes. He has a 35 pack-year smoking history. He has never used smokeless tobacco. He reports that he does not currently use drugs. He reports that he does not drink alcohol. Allergies  Allergen Reactions   Doxazosin Mesylate Other (See Comments)    Unknown reaction- pt unsure   Family History  Problem Relation Age of Onset   Heart failure Mother    Prior to Admission medications   Medication Sig Start Date End Date Taking? Authorizing Provider  aspirin  EC (ASPIRIN  LOW DOSE) 81 MG tablet Take 1 tablet (81 mg total) by mouth daily. 11/03/22   Shlomo Wilbert SAUNDERS, MD  isosorbide  mononitrate (IMDUR ) 30 MG 24 hr tablet TAKE 1 TABLET(30 MG) BY MOUTH DAILY 07/07/22   Shlomo Wilbert SAUNDERS, MD  metoprolol succinate (TOPROL XL) 25 MG 24 hr tablet Take 1 tablet (25 mg total) by mouth daily. 12/11/23   Walker, Caitlin S, NP  Multiple Vitamins-Minerals (MULTIVITAMIN ADULTS 50+ PO) Take 1 tablet by mouth daily.    [provider]  nitroGLYCERIN  (NITROSTAT ) 0.4 MG SL tablet Place 1 tablet (0.4 mg total) under the tongue every 5 (five) minutes as needed for chest pain. 09/11/22   Shlomo Wilbert SAUNDERS, MD  rosuvastatin  (CRESTOR ) 40 MG tablet Take 1 tablet (40 mg total) by mouth daily. 05/10/20   Shlomo Wilbert SAUNDERS, MD  traMADol (ULTRAM) 50 MG tablet Take 1 tablet (50 mg total) by mouth every 6 (six) hours as needed for moderate pain (pain score 4-6). 01/05/24   Bethanie Cough, PA-C                                                                                 Vitals:    01/13/24 1648 01/13/24 1757 01/13/24 1758 01/13/24 1900  BP:  (!) 203/114 (!) 211/121 (!) 189/107  Pulse:  86 79   Resp:      Temp: 98.6 F (37 C)  98.3 F (36.8 C)   TempSrc: Oral  Oral   SpO2:  92%     Physical Exam Vitals reviewed.  Constitutional:      General: He is not in acute distress.    Appearance: He is not ill-appearing.  HENT:     Head:  Normocephalic.  Eyes:     Extraocular Movements: Extraocular movements intact.  Cardiovascular:     Rate and Rhythm: Normal rate and regular rhythm.     Pulses: Normal pulses.     Heart sounds: Normal heart sounds.  Pulmonary:     Effort: Pulmonary effort is normal.     Breath sounds: Normal breath sounds.  Abdominal:     General: There is no distension.     Palpations: Abdomen is soft.     Tenderness: There is no abdominal tenderness.  Musculoskeletal:        General: Tenderness present.     Right lower leg: No edema.     Left lower leg: No edema.  Neurological:     General: No focal deficit present.     Mental Status: He is alert and oriented to person, place, and time.     Labs on Admission: I have personally reviewed following labs and imaging studies CBC: Recent Labs  Lab 01/13/24 1334  WBC 6.8  HGB 9.6*  HCT 29.7*  MCV 79.2*  PLT 225   Basic Metabolic Panel: Recent Labs  Lab 01/13/24 1334  NA 134*  K 2.7*  CL 96*  CO2 26  GLUCOSE 200*  BUN 13  CREATININE 1.46*  CALCIUM  8.6*   GFR: Estimated Creatinine Clearance: 34.9 mL/min (A) (by C-G formula based on SCr of 1.46 mg/dL (H)). Liver Function Tests: No results for input(s): AST, ALT, ALKPHOS, BILITOT, PROT, ALBUMIN  in the last 168 hours. No results for input(s): LIPASE, AMYLASE in the last 168 hours. No results for input(s): AMMONIA in the last 168 hours. Recent Labs    04/06/23 1424 11/06/23 1359 01/01/24 1100 01/04/24 1234 01/05/24 0348 01/13/24 1334  BUN  --  22 16 14 18 13   CREATININE 1.50* 1.43* 1.33* 1.40* 1.70*  1.46*    Cardiac Enzymes: No results for input(s): CKTOTAL, CKMB, CKMBINDEX, TROPONINI in the last 168 hours. BNP (last 3 results) No results for input(s): PROBNP in the last 8760 hours. HbA1C: No results for input(s): HGBA1C in the last 72 hours. CBG: No results for input(s): GLUCAP in the last 168 hours. Lipid Profile: No results for input(s): CHOL, HDL, LDLCALC, TRIG, CHOLHDL, LDLDIRECT in the last 72 hours. Thyroid  Function Tests: No results for input(s): TSH, T4TOTAL, FREET4, T3FREE, THYROIDAB in the last 72 hours. Anemia Panel: No results for input(s): VITAMINB12, FOLATE, FERRITIN, TIBC, IRON, RETICCTPCT in the last 72 hours. Urine analysis:    Component Value Date/Time   COLORURINE YELLOW 01/01/2024 1110   APPEARANCEUR CLEAR 01/01/2024 1110   LABSPEC 1.009 01/01/2024 1110   PHURINE 7.0 01/01/2024 1110   GLUCOSEU NEGATIVE 01/01/2024 1110   HGBUR NEGATIVE 01/01/2024 1110   BILIRUBINUR NEGATIVE 01/01/2024 1110   KETONESUR NEGATIVE 01/01/2024 1110   PROTEINUR NEGATIVE 01/01/2024 1110   UROBILINOGEN 0.2 07/11/2014 0827   NITRITE NEGATIVE 01/01/2024 1110   LEUKOCYTESUR NEGATIVE 01/01/2024 1110   Radiological Exams on Admission: DG Hip Unilat W or Wo Pelvis 2-3 Views Right Result Date: 01/13/2024 CLINICAL DATA:  Clemens last week, inability to ambulate, right hip pain EXAM: DG HIP (WITH OR WITHOUT PELVIS) 2-3V RIGHT COMPARISON:  11/29/2023 FINDINGS: Frontal view of the pelvis as well as frontal and frogleg lateral views of the right hip are obtained. There is an impacted subcapital right femoral neck fracture with varus angulation at the fracture site. Left hip is unremarkable. Remainder of the bony pelvis appears normal. Embolic coils left hemipelvis. And luminal stent graft within the  abdominal aorta and iliac vessels. IMPRESSION: 1. Acute subcapital right femoral neck fracture with impaction and varus angulation. Electronically Signed    By: Ozell Daring M.D.   On: 01/13/2024 14:12   Data Reviewed: Relevant notes from primary care and specialist visits, past discharge summaries as available in EHR, including Care Everywhere . Prior diagnostic testing as pertinent to current admission diagnoses, Updated medications and problem lists for reconciliation .ED course, including vitals, labs, imaging, treatment and response to treatment,Triage notes, nursing and pharmacy notes and ED provider's notes.Notable results as noted in HPI.Discussed case with EDMD/ ED APP/ or Specialty MD on call and as needed.  Assessment & Plan   >> Mechanical fall: Fall precautions physical therapy, short-term rehab as needed per PT recommendations.  >> Right femoral neck fracture: N.p.o. after midnight, gentle IV fluid hydration, pain control, orthopedics consulted with tentative surgery plan in AM.   >> History of CAD/NSTEMI: EKG nonischemic today.  Continue patient on rosuvastatin  metoprolol . ASA held.   >> Essential hypertension: Vitals:   01/13/24 1304 01/13/24 1757 01/13/24 1758 01/13/24 1900  BP: (!) 156/107 (!) 203/114 (!) 211/121 (!) 189/107  Continue patient on his metoprolol and Imdur  pain control. PRN hydralazine .   >> CKD stage IIIa: Lab Results  Component Value Date   CREATININE 1.46 (H) 01/13/2024   CREATININE 1.70 (H) 01/05/2024   CREATININE 1.40 (H) 01/04/2024  Improved CKD, again will avoid contrast unless absolutely necessary take renal precautions , renally dose needed medications.  >> Abdominal aortic aneurysm S/P endovascular repair: 7.5 cm infrarenal abdominal aortic aneurysm is noted with some degree of mural thrombus. There is an infrarenal neck of at least 5 cm. Recommend referral to a vascular specialist.18 mm right common iliac artery aneurysm is noted. 19 mm right internal iliac artery aneurysm is noted. 3 cm left common iliac artery aneurysm is noted. 3.2 cm left internal iliac artery aneurysm is noted, and  pt is s/p recent infrarenal endovascular aortic repair with coil of the left internal iliac artery, its branches on 01/04/2024.   DVT prophylaxis:  SCD.  Consults:  Ortho.   Advance Care Planning:    Code Status: Full Code   Family Communication:  Daughter at bedside.   Disposition Plan:  To be determined.  Severity of Illness: The appropriate patient status for this patient is INPATIENT. Inpatient status is judged to be reasonable and necessary in order to provide the required intensity of service to ensure the patient's safety. The patient's presenting symptoms, physical exam findings, and initial radiographic and laboratory data in the context of their chronic comorbidities is felt to place them at high risk for further clinical deterioration. Furthermore, it is not anticipated that the patient will be medically stable for discharge from the hospital within 2 midnights of admission.   * I certify that at the point of admission it is my clinical judgment that the patient will require inpatient hospital care spanning beyond 2 midnights from the point of admission due to high intensity of service, high risk for further deterioration and high frequency of surveillance required.*  Unresulted Labs (From admission, onward)     Start     Ordered   01/14/24 0500  CBC  Tomorrow morning,   R        01/13/24 1600   01/14/24 0500  Basic metabolic panel  Tomorrow morning,   R        01/13/24 1600   01/13/24 1536  Hepatic function panel  Add-on,  AD        01/13/24 1535   01/13/24 1536  Hemoglobin A1c  Add-on,   AD        01/13/24 1536   01/13/24 1446  Magnesium   Add-on,   AD        01/13/24 1445            Meds ordered this encounter  Medications   potassium chloride  SA (KLOR-CON  M) CR tablet 40 mEq   potassium chloride  10 mEq in 100 mL IVPB   magnesium  sulfate IVPB 2 g 50 mL   lactated ringers bolus 500 mL   tranexamic acid (CYKLOKAPRON) IVPB 1,000 mg   ceFAZolin  (ANCEF ) IVPB  2g/100 mL premix    Antibiotic Indication::   Surgical Prophylaxis   DISCONTD: HYDROcodone-acetaminophen  (NORCO/VICODIN) 5-325 MG per tablet 1-2 tablet    Refill:  0   DISCONTD: morphine  (PF) 2 MG/ML injection 2 mg   OR Linked Order Group    methocarbamol (ROBAXIN) tablet 500 mg    methocarbamol (ROBAXIN) injection 500 mg   polyethylene glycol (MIRALAX / GLYCOLAX) packet 17 g   bisacodyl (DULCOLAX) EC tablet 5 mg   DISCONTD: hydrALAZINE  (APRESOLINE ) injection 10 mg   morphine  (PF) 2 MG/ML injection 2 mg   HYDROcodone-acetaminophen  (NORCO/VICODIN) 5-325 MG per tablet 1-2 tablet    Refill:  0   metoprolol succinate (TOPROL-XL) 24 hr tablet 25 mg   isosorbide  mononitrate (IMDUR ) 24 hr tablet 30 mg   rosuvastatin  (CRESTOR ) tablet 40 mg   hydrALAZINE  (APRESOLINE ) injection 10 mg     Orders Placed This Encounter  Procedures   Critical Care   DG Hip Unilat W or Wo Pelvis 2-3 Views Right   CBC   Basic metabolic panel   Magnesium    Hepatic function panel   Hemoglobin A1c   CBC   Basic metabolic panel   Diet Carb Modified Fluid consistency: Thin; Room service appropriate? Yes   Diet NPO time specified   SCDs   Vital signs every hour x 4, then every 4 hours   Neurovascular checks every hour x 4, then every 4 hours   Bed with an overhead patient helper or overhead frame with a trapeze bar   Apply ice to affected area   Elevate heels off of bed   Turn cough deep breathe   Incentive spirometry   Bed rest with HOB to 30 degrees   Intake and output   If diabetic or glucose greater than 140mg /dl, notify physician to place Gylcemic Control (SSI) Order Set   Initiate Oral Care Protocol   Initiate Carrier Fluid Protocol   Document Pasero Opioid-Induced Sedation Scale (POSS) per protocol (see sidebar report)   Informed Consent Details: Physician/Practitioner Attestation; Transcribe to consent form and obtain patient signature   Full code   Consult to orthopedic surgery   Consult to  hospitalist   Consult to Transition of Care Team   Consult to Registered Dietitian   Consult to anesthesiology for nerve block Laterality: Right; Nerve block will be performed in PACU.   Oxygen therapy Mode or (Route): Nasal cannula; Liters Per Minute: 2   Pulse oximetry, continuous   Pulse oximetry, every 4 hours   ED EKG   EKG 12-Lead   Type and screen [facility]   Admit to Inpatient (patient's expected length of stay will be greater than 2 midnights or inpatient only procedure)    Author: Mario LULLA Blanch, MD 12 pm- 8 pm. [facility]. 01/13/2024 7:26  PM Please note for any communication after hours contact [facility] Assigned provider on call on Amion.

## 2024-01-13 NOTE — ED Notes (Signed)
 X-ray at bedside.

## 2024-01-14 ENCOUNTER — Encounter (HOSPITAL_COMMUNITY)
Admission: EM | Disposition: A | Discharge status: Skilled Nursing Facility | Payer: Self-pay | Source: Home / Self Care | Attending: Family Medicine

## 2024-01-14 ENCOUNTER — Inpatient Hospital Stay (HOSPITAL_COMMUNITY)

## 2024-01-14 ENCOUNTER — Inpatient Hospital Stay (HOSPITAL_COMMUNITY): Admitting: Anesthesiology

## 2024-01-14 ENCOUNTER — Encounter (HOSPITAL_COMMUNITY): Payer: Self-pay | Admitting: Internal Medicine

## 2024-01-14 DIAGNOSIS — I69020 Aphasia following nontraumatic subarachnoid hemorrhage: Secondary | ICD-10-CM

## 2024-01-14 DIAGNOSIS — G928 Other toxic encephalopathy: Secondary | ICD-10-CM | POA: Diagnosis not present

## 2024-01-14 DIAGNOSIS — E43 Unspecified severe protein-calorie malnutrition: Secondary | ICD-10-CM | POA: Insufficient documentation

## 2024-01-14 DIAGNOSIS — Z87891 Personal history of nicotine dependence: Secondary | ICD-10-CM

## 2024-01-14 DIAGNOSIS — E876 Hypokalemia: Secondary | ICD-10-CM

## 2024-01-14 DIAGNOSIS — R569 Unspecified convulsions: Secondary | ICD-10-CM | POA: Diagnosis not present

## 2024-01-14 DIAGNOSIS — S72001A Fracture of unspecified part of neck of right femur, initial encounter for closed fracture: Secondary | ICD-10-CM

## 2024-01-14 DIAGNOSIS — I2510 Atherosclerotic heart disease of native coronary artery without angina pectoris: Secondary | ICD-10-CM

## 2024-01-14 DIAGNOSIS — R410 Disorientation, unspecified: Secondary | ICD-10-CM | POA: Diagnosis not present

## 2024-01-14 DIAGNOSIS — S72041A Displaced fracture of base of neck of right femur, initial encounter for closed fracture: Secondary | ICD-10-CM

## 2024-01-14 HISTORY — PX: HIP ARTHROPLASTY: SHX981

## 2024-01-14 LAB — SURGICAL PCR SCREEN
MRSA, PCR: NEGATIVE
Staphylococcus aureus: NEGATIVE

## 2024-01-14 LAB — BASIC METABOLIC PANEL WITH GFR
Anion gap: 12 (ref 5–15)
BUN: 12 mg/dL (ref 8–23)
CO2: 25 mmol/L (ref 22–32)
Calcium: 8.5 mg/dL — ABNORMAL LOW (ref 8.9–10.3)
Chloride: 97 mmol/L — ABNORMAL LOW (ref 98–111)
Creatinine, Ser: 1.35 mg/dL — ABNORMAL HIGH (ref 0.61–1.24)
GFR, Estimated: 51 mL/min — ABNORMAL LOW (ref >60)
Glucose, Bld: 126 mg/dL — ABNORMAL HIGH (ref 70–99)
Potassium: 3.6 mmol/L (ref 3.5–5.1)
Sodium: 134 mmol/L — ABNORMAL LOW (ref 135–145)

## 2024-01-14 LAB — CBC
HCT: 29.1 % — ABNORMAL LOW (ref 39.0–52.0)
Hemoglobin: 9.6 g/dL — ABNORMAL LOW (ref 13.0–17.0)
MCH: 25.7 pg — ABNORMAL LOW (ref 26.0–34.0)
MCHC: 33 g/dL (ref 30.0–36.0)
MCV: 77.8 fL — ABNORMAL LOW (ref 80.0–100.0)
Platelets: 253 K/uL (ref 150–400)
RBC: 3.74 MIL/uL — ABNORMAL LOW (ref 4.22–5.81)
RDW: 16.5 % — ABNORMAL HIGH (ref 11.5–15.5)
WBC: 6.5 K/uL (ref 4.0–10.5)
nRBC: 0 % (ref 0.0–0.2)

## 2024-01-14 SURGERY — HEMIARTHROPLASTY (BIPOLAR) HIP, POSTERIOR APPROACH FOR FRACTURE
Anesthesia: General | Laterality: Right

## 2024-01-14 MED ORDER — ACETAMINOPHEN 10 MG/ML IV SOLN
INTRAVENOUS | Status: AC
Start: 2024-01-14 — End: 2024-01-14
  Filled 2024-01-14: qty 100

## 2024-01-14 MED ORDER — ONDANSETRON HCL 4 MG/2ML IJ SOLN
INTRAMUSCULAR | Status: DC | PRN
  Administered 2024-01-14: 4 mg via INTRAVENOUS

## 2024-01-14 MED ORDER — 0.9 % SODIUM CHLORIDE (POUR BTL) OPTIME
TOPICAL | Status: DC | PRN
  Administered 2024-01-14: 1000 mL

## 2024-01-14 MED ORDER — FENTANYL CITRATE (PF) 100 MCG/2ML IJ SOLN
INTRAMUSCULAR | Status: AC
  Filled 2024-01-14: qty 2

## 2024-01-14 MED ORDER — PHENYLEPHRINE HCL-NACL 20-0.9 MG/250ML-% IV SOLN
INTRAVENOUS | Status: DC | PRN
  Administered 2024-01-14: 55 ug/min via INTRAVENOUS

## 2024-01-14 MED ORDER — ROCURONIUM BROMIDE 10 MG/ML (PF) SYRINGE
PREFILLED_SYRINGE | INTRAVENOUS | Status: DC | PRN
  Administered 2024-01-14: 30 mg via INTRAVENOUS
  Administered 2024-01-14: 40 mg via INTRAVENOUS
  Administered 2024-01-14: 30 mg via INTRAVENOUS

## 2024-01-14 MED ORDER — VANCOMYCIN HCL 1000 MG IV SOLR
INTRAVENOUS | Status: AC
Start: 2024-01-14 — End: 2024-01-14
  Filled 2024-01-14: qty 20

## 2024-01-14 MED ORDER — ONDANSETRON HCL 4 MG/2ML IJ SOLN
INTRAMUSCULAR | Status: AC
  Filled 2024-01-14: qty 2

## 2024-01-14 MED ORDER — FENTANYL CITRATE (PF) 100 MCG/2ML IJ SOLN
25.0000 ug | INTRAMUSCULAR | Status: DC | PRN
  Administered 2024-01-14: 50 ug via INTRAVENOUS

## 2024-01-14 MED ORDER — ENSURE PLUS HIGH PROTEIN PO LIQD: 237.0000 mL | Freq: Two times a day (BID) | ORAL | Status: DC

## 2024-01-14 MED ORDER — CHLORHEXIDINE GLUCONATE 0.12 % MT SOLN: 15.0000 mL | Freq: Once | OROMUCOSAL | Status: AC

## 2024-01-14 MED ORDER — CHLORHEXIDINE GLUCONATE 0.12 % MT SOLN
OROMUCOSAL | Status: AC
  Administered 2024-01-14: 15 mL via OROMUCOSAL
  Filled 2024-01-14: qty 15

## 2024-01-14 MED ORDER — LACTATED RINGERS IV SOLN: INTRAVENOUS | Status: DC | PRN

## 2024-01-14 MED ORDER — HYDROMORPHONE HCL 1 MG/ML IJ SOLN
INTRAMUSCULAR | Status: DC | PRN
  Administered 2024-01-14: .5 mg via INTRAVENOUS

## 2024-01-14 MED ORDER — LIDOCAINE 2% (20 MG/ML) 5 ML SYRINGE
INTRAMUSCULAR | Status: DC | PRN
Start: 2024-01-14 — End: 2024-01-14
  Administered 2024-01-14: 60 mg via INTRAVENOUS

## 2024-01-14 MED ORDER — HYDROMORPHONE HCL 1 MG/ML IJ SOLN
INTRAMUSCULAR | Status: AC
  Filled 2024-01-14: qty 0.5

## 2024-01-14 MED ORDER — VITAMIN D 25 MCG (1000 UNIT) PO TABS: 1000.0000 [IU] | ORAL_TABLET | Freq: Every day | ORAL | Status: DC

## 2024-01-14 MED ORDER — FENTANYL CITRATE (PF) 100 MCG/2ML IJ SOLN
INTRAMUSCULAR | Status: DC | PRN
  Administered 2024-01-14: 100 ug via INTRAVENOUS
  Administered 2024-01-14 (×2): 50 ug via INTRAVENOUS

## 2024-01-14 MED ORDER — SUGAMMADEX SODIUM 200 MG/2ML IV SOLN
INTRAVENOUS | Status: DC | PRN
  Administered 2024-01-14: 200 mg via INTRAVENOUS

## 2024-01-14 MED ORDER — IOHEXOL 350 MG/ML SOLN
75.0000 mL | Freq: Once | INTRAVENOUS | Status: AC | PRN
  Administered 2024-01-14: 75 mL via INTRAVENOUS

## 2024-01-14 MED ORDER — VANCOMYCIN HCL 1000 MG IV SOLR
INTRAVENOUS | Status: DC | PRN
Start: 2024-01-14 — End: 2024-01-14
  Administered 2024-01-14: 1000 mg

## 2024-01-14 MED ORDER — ADULT MULTIVITAMIN W/MINERALS CH: 1.0000 | ORAL_TABLET | Freq: Every day | ORAL | Status: DC

## 2024-01-14 MED ORDER — PROPOFOL 10 MG/ML IV BOLUS
INTRAVENOUS | Status: DC | PRN
  Administered 2024-01-14: 100 mg via INTRAVENOUS

## 2024-01-14 MED ORDER — THIAMINE MONONITRATE 100 MG PO TABS: 100.0000 mg | ORAL_TABLET | Freq: Every day | ORAL | Status: DC

## 2024-01-14 MED ORDER — LACTATED RINGERS IV SOLN: INTRAVENOUS | Status: DC

## 2024-01-14 MED ORDER — AMISULPRIDE (ANTIEMETIC) 5 MG/2ML IV SOLN
10.0000 mg | Freq: Once | INTRAVENOUS | Status: DC | PRN
Start: 2024-01-14 — End: 2024-01-14

## 2024-01-14 MED ORDER — ORAL CARE MOUTH RINSE: 15.0000 mL | Freq: Once | OROMUCOSAL | Status: AC

## 2024-01-14 SURGICAL SUPPLY — 60 items
BIT DRILL QC 2.5MM SHRT EVO SM (DRILL) IMPLANT
BLADE SAGITTAL 25.0X1.27X90 (BLADE) ×1 IMPLANT
BRUSH FEMORAL CANAL (MISCELLANEOUS) IMPLANT
CEMENT BONE RALLY HV 40G (Cement) IMPLANT
CHLORAPREP W/TINT 26 (MISCELLANEOUS) ×2 IMPLANT
COVER SURGICAL LIGHT HANDLE (MISCELLANEOUS) ×1 IMPLANT
DERMABOND ADVANCED .7 DNX12 (GAUZE/BANDAGES/DRESSINGS) ×1 IMPLANT
DRAPE HALF SHEET 40X57 (DRAPES) ×1 IMPLANT
DRAPE HIP W/POCKET STRL (MISCELLANEOUS) ×1 IMPLANT
DRAPE INCISE IOBAN 66X45 STRL (DRAPES) ×1 IMPLANT
DRAPE INCISE IOBAN 85X60 (DRAPES) ×1 IMPLANT
DRAPE POUCH INSTRU U-SHP 10X18 (DRAPES) ×1 IMPLANT
DRAPE U-SHAPE 47X51 STRL (DRAPES) ×2 IMPLANT
DRSG AQUACEL AG ADV 3.5X10 (GAUZE/BANDAGES/DRESSINGS) ×1 IMPLANT
ELECT REM PT RETURN 15FT ADLT (MISCELLANEOUS) ×1 IMPLANT
ELECTRODE BLDE 4.0 EZ CLN MEGD (MISCELLANEOUS) ×1 IMPLANT
GLOVE BIO SURGEON STRL SZ 6.5 (GLOVE) ×1 IMPLANT
GLOVE BIOGEL PI IND STRL 6.5 (GLOVE) ×1 IMPLANT
GLOVE SRG 8 PF TXTR STRL LF DI (GLOVE) ×1 IMPLANT
GLOVE SURG ORTHO LTX SZ8 (GLOVE) ×2 IMPLANT
GOWN STRL REUS W/ TWL LRG LVL3 (GOWN DISPOSABLE) ×1 IMPLANT
GOWN STRL REUS W/ TWL XL LVL3 (GOWN DISPOSABLE) ×2 IMPLANT
HEAD 28MM 0 (Hips) IMPLANT
HOOD PEEL AWAY T7 (MISCELLANEOUS) ×3 IMPLANT
KIT BASIN OR (CUSTOM PROCEDURE TRAY) ×1 IMPLANT
KIT PREP HIP W/CEMENT RESTRICT (Miscellaneous) IMPLANT
KIT TURNOVER KIT B (KITS) ×1 IMPLANT
LINER BIPOLAR SHELL 28ID 52OD (Liner) IMPLANT
MANIFOLD NEPTUNE II (INSTRUMENTS) ×1 IMPLANT
MARKER SKIN DUAL TIP RULER LAB (MISCELLANEOUS) ×1 IMPLANT
NDL 18GX1X1/2 (RX/OR ONLY) (NEEDLE) ×2 IMPLANT
NEEDLE 18GX1X1/2 (RX/OR ONLY) (NEEDLE) ×2 IMPLANT
PACK TOTAL JOINT (CUSTOM PROCEDURE TRAY) ×1 IMPLANT
PRESSURIZER FEMORAL UNIV (MISCELLANEOUS) ×1 IMPLANT
RETRIEVER SUT HEWSON (MISCELLANEOUS) ×1 IMPLANT
SEALER BIPOLAR AQUA 6.0 (INSTRUMENTS) IMPLANT
SET HNDPC FAN SPRY TIP SCT (DISPOSABLE) IMPLANT
SET INTERPULSE LAVAGE W/TIP (ORTHOPEDIC DISPOSABLE SUPPLIES) IMPLANT
SOLN 0.9% NACL POUR BTL 1000ML (IV SOLUTION) ×1 IMPLANT
SOLN STERILE WATER BTL 1000 ML (IV SOLUTION) ×1 IMPLANT
SPONGE T-LAP 18X18 ~~LOC~~+RFID (SPONGE) IMPLANT
STAPLER SKIN PROX 35W (STAPLE) IMPLANT
STEM CENTR INVIS DIST FEM 9 (Stem) IMPLANT
STEM FEMORAL SZ9 110 131D (Stem) IMPLANT
SUCTION TUBE FRAZIER 10FR DISP (SUCTIONS) ×1 IMPLANT
SUT BONE WAX W31G (SUTURE) ×1 IMPLANT
SUT ETHIBOND 2 V 37 (SUTURE) ×1 IMPLANT
SUT MNCRL AB 3-0 PS2 27 (SUTURE) ×1 IMPLANT
SUT STRATAFIX 1PDS 45CM VIOLET (SUTURE) ×2 IMPLANT
SUT VIC AB 0 CT1 27XBRD ANBCTR (SUTURE) ×1 IMPLANT
SUT VIC AB 1 CT1 18XCR BRD 8 (SUTURE) IMPLANT
SUT VIC AB 2-0 CT2 18 VCP726D (SUTURE) IMPLANT
SUT VIC AB 2-0 CT2 27 (SUTURE) ×2 IMPLANT
SUTURE FIBERWR #2 38 T-5 BLUE (SUTURE) IMPLANT
SYR 20ML LL LF (SYRINGE) ×1 IMPLANT
SYR 50ML LL SCALE MARK (SYRINGE) ×1 IMPLANT
TOWEL GREEN STERILE (TOWEL DISPOSABLE) ×1 IMPLANT
TOWER CARTRIDGE SMART MIX (DISPOSABLE) IMPLANT
TRAY FOLEY MTR SLVR 16FR STAT (SET/KITS/TRAYS/PACK) IMPLANT
TUBE SUCT ARGYLE STRL (TUBING) ×1 IMPLANT

## 2024-01-14 NOTE — Progress Notes (Signed)
 Patient seen in PACU. No acute events since surgery. Recovering in PACU. Pain well controlled. He is now s/p right cemented hemiarthroplasty for a right femoral neck fracture.    RLE: EHL/TA/GSC intact Sensation intact to light touch in s/s/dp/sp/t nerve distributions Foot warm and well perfused Palpable DP pulse Leg lengths symmetric   Plan:  WBAT RLE with posterior hip precautions Abduction pillow when in bed Aspirin  81mg  BID for DVT ppx PT evaluate and treat Ancef  x2 post-op doses Heart healthy diet To floor from PACU   Ozell DELENA Ada, MD Orthopedic Surgeon

## 2024-01-14 NOTE — Anesthesia Preprocedure Evaluation (Addendum)
 Anesthesia Evaluation  Patient identified by MRN, date of birth, ID band Patient awake and Patient confused    Reviewed: Allergy & Precautions, NPO status , Patient's Chart, lab work & pertinent test results  Airway Mallampati: II  TM Distance: >3 FB Neck ROM: Full    Dental  (+) Teeth Intact, Dental Advisory Given   Pulmonary former smoker   breath sounds clear to auscultation       Cardiovascular hypertension, Pt. on home beta blockers + CAD, + Past MI and + Peripheral Vascular Disease   Rhythm:Regular Rate:Normal  Echo:   1. Left ventricular ejection fraction, by estimation, is 60 to 65%. The  left ventricle has normal function. The left ventricle has no regional  wall motion abnormalities. There is severe asymmetric left ventricular  hypertrophy. Left ventricular diastolic   parameters are consistent with Grade I diastolic dysfunction (impaired  relaxation).   2. Right ventricular systolic function is normal. The right ventricular  size is normal. Moderately increased right ventricular wall thickness.   3. The mitral valve is degenerative. No evidence of mitral valve  regurgitation.   4. The aortic valve was not well visualized. Aortic valve regurgitation  is trivial. No aortic stenosis is present.   5. Aortic dilatation noted. There is mild dilatation of the ascending  aorta, measuring 43 mm.     Neuro/Psych  Neuromuscular disease    GI/Hepatic Neg liver ROS, PUD,,,  Endo/Other  negative endocrine ROS    Renal/GU Renal disease     Musculoskeletal   Abdominal   Peds  Hematology  (+) Blood dyscrasia, anemia   Anesthesia Other Findings   Reproductive/Obstetrics                              Lab Results  Component Value Date   WBC 6.5 01/14/2024   HGB 9.6 (L) 01/14/2024   HCT 29.1 (L) 01/14/2024   MCV 77.8 (L) 01/14/2024   PLT 253 01/14/2024   Lab Results  Component Value  Date   NA 134 (L) 01/14/2024   CL 97 (L) 01/14/2024   K 3.6 01/14/2024   CO2 25 01/14/2024   BUN 12 01/14/2024   CREATININE 1.35 (H) 01/14/2024   GFRNONAA 51 (L) 01/14/2024   CALCIUM  8.5 (L) 01/14/2024   PHOS 3.2 03/27/2017   ALBUMIN  2.3 (L) 01/13/2024   GLUCOSE 126 (H) 01/14/2024    Anesthesia Physical Anesthesia Plan  ASA: 3  Anesthesia Plan: General   Post-op Pain Management: Ofirmev  IV (intra-op)*   Induction: Intravenous  PONV Risk Score and Plan: 3 and Ondansetron , Dexamethasone  and Midazolam   Airway Management Planned: Oral ETT  Additional Equipment:   Intra-op Plan:   Post-operative Plan: Extubation in OR  Informed Consent: I have reviewed the patients History and Physical, chart, labs and discussed the procedure including the risks, benefits and alternatives for the proposed anesthesia with the patient or authorized representative who has indicated his/her understanding and acceptance.     Dental advisory given and Consent reviewed with POA  Plan Discussed with: CRNA  Anesthesia Plan Comments: (PAT note by Lynwood Hope, PA-C: 88 year old male follows with cardiology for history of CVA, HTN, HLD, mildly dilated ascending aorta, orthostatic hypotension, CAD s/p NSTEMI 04/2020 in setting of hypertensive urgency with LHC with diffuse small vessel disease in the distal PDA 75%, small OM1 75%, OM2 99% branches and 50% P D1 which were not amenable to PCI and recommended medical management.  Myoview  10/2020 with no significant ischemia.Echo 09/2022 normal LVEF, severe LVH, ascending arota dialtion 4.3 cm. Subsequent cardiac PET 12/2022 with LVEF 56%, no ischemia, low risk. CT chest 04/06/23 with ascending aorta measurment 3.9 cm. Abdominal aortic aneurysm measuring 7.5 cm incidentally finding on CT 11/2023. By CTA 11/29/23 renal arteries patent, normal adrenal gland.  Patient seen by Reche Finder, NP on 12/11/2023 for preop evaluation.  Per note, Preop / AAA - EVAR pending  with Dr. Lanis for 7.5cm infrarenal abdominal aneurysm. According to the Revised Cardiac Risk Index (RCRI), his Perioperative Risk of Major Cardiac Event is (%): 11. Based on his activity at PT twice per week and home exercises capacity >4METS.  Cardiac PET 12/2022 - low risk. Per AHA/ACC guidelines, he is deemed acceptable risk for the planned procedure without additional cardiovascular testing. Will route to surgical team so they are aware.  May hold Aspirin  7 days prior to procedure if necessary.  Other pertinent history includes former smoker, anemia, CKD 3A, GI bleed.  Preop labs reviewed, mild hypokalemia potassium 3.2, creatinine mildly elevated 1.33, mild hypoalbuminemia albumin  3.2, mild chronic anemia hemoglobin 11.3, otherwise unremarkable.  EKG 12/11/2023: Normal sinus rhythm.  Rate 75. Left anterior fascicular block  Cardiac PET CT 01/09/2023:   The study is normal. The study is low risk.   LV perfusion is normal.   Rest left ventricular function is normal. Rest EF: 56%. Stress left ventricular function is normal. Stress EF: 59%. End diastolic cavity size is normal. End systolic cavity size is normal.   Myocardial blood flow was computed to be 0.37ml/g/min at rest and 2.30ml/g/min at stress. Global myocardial blood flow reserve was 2.37 and was normal.   Electronically signed by Jerel Balding, MD  TTE 10/02/2022: 1. Left ventricular ejection fraction, by estimation, is 60 to 65%. The  left ventricle has normal function. The left ventricle has no regional  wall motion abnormalities. There is severe asymmetric left ventricular  hypertrophy. Left ventricular diastolic  parameters are consistent with Grade I diastolic dysfunction (impaired  relaxation).  2. Right ventricular systolic function is normal. The right ventricular  size is normal. Moderately increased right ventricular wall thickness.  3. The mitral valve is degenerative. No evidence of mitral valve   regurgitation.  4. The aortic valve was not well visualized. Aortic valve regurgitation  is trivial. No aortic stenosis is present.  5. Aortic dilatation noted. There is mild dilatation of the ascending  aorta, measuring 43 mm.   Comparison(s): Prior images reviewed side by side. Acending diameter  increased from prior; technically difficult study.   Conclusion(s)/Recommendation(s): In the setting of biventricular  hypertrophy, nuclear scintigraphy imaging (PYP) may be considered.     )         Anesthesia Quick Evaluation

## 2024-01-14 NOTE — Progress Notes (Signed)
 RR nurse Mindy at Patient bedside to evaluate patient. CBG 154. Rectal probe in use for temperature monitoring. Patient not consistently or accurately answering questions. Following some commands intermittently.

## 2024-01-14 NOTE — Progress Notes (Addendum)
 Initial Nutrition Assessment  DOCUMENTATION CODES:   Severe malnutrition in context of chronic illness  INTERVENTION:  Encourage po intake Recommend regular diet after OR Room service with assist Nursing to assist with meals  Ensure Plus High Protein po BID, each supplement provides 350 kcal and 20 grams of protein Magic cup TID with meals, each supplement provides 290 kcal and 9 grams of protein *Prefers chocolate MVI with minerals daily  100 mg Thiamine x 7 days  1000 units Vitamin D  daily High calorie, high protein handout in AVS Monitor magnesium , potassium, and phosphorus daily for at least 3 days, MD to replete as needed, as pt is at risk for refeeding syndrome  NUTRITION DIAGNOSIS:   Severe Malnutrition related to chronic illness as evidenced by severe muscle depletion, severe fat depletion.  GOAL:   Patient will meet greater than or equal to 90% of their needs  MONITOR:   PO intake, Supplement acceptance, Diet advancement, Labs, Skin  REASON FOR ASSESSMENT:   Consult Assessment of nutrition requirement/status, Hip fracture protocol  ASSESSMENT:  88 y.o. male with PMH of AAA, CAD, CKD 3a, duodenal ulcer/history of GI bleed, CKD stage IIIa, CAD, HLD, HTN,  MI. Admitted after ground level fall and found to have right femoral neck fracture.  11/2 - Imaging of right hip and pelvis showed displaced subcapital femoral neck fracture   Pt resting on visit, appears frail. Pt does not seem to be a reliable historian as he was confused answering questions. He is also very hard to understand and mumbles, seems to have aphasia? He does report losing weight but is unable to explain how. States he lives alone. Per chart review daughter reports pt was living independently and ambulatory PTA. On exam pt with severe muscle and fat wasting which indicates pt most likely has not been eating well for some time.   Limited weight history available however in the last 2 months pt has been  steadily losing weight, suspect pt has been losing weight for a longer time period but, unable to confirm. New weight of 147 lbs today down from 150 lbs 1 weeks ago, 2%. Not clinically significant but is concerning given overall clinically picture. Pt is severely malnourished. Plan for OR later today, recommend regular diet with ONS, and vitamins post op. Refeeding risk. Encourage Po intake.   Admit weight: 66.8 kg Current weight: 66.8 kg   Wt Readings from Last 10 Encounters:  01/04/24 68 kg  01/02/24 68.1 kg  01/01/24 67.6 kg  12/27/23 68.6 kg  12/25/23 68.1 kg  12/20/23 69.4 kg  12/18/23 68.8 kg  12/11/23 68 kg  12/07/23 69.4 kg  12/06/23 68.6 kg     Average Meal Intake: NPO for surgery   Nutritionally Relevant Medications: Scheduled Meds:  [START ON 01/15/2024] cholecalciferol  1,000 Units Oral Daily   [START ON 01/15/2024] feeding supplement  237 mL Oral BID BM   [START ON 01/15/2024] multivitamin with minerals  1 tablet Oral Daily   [START ON 01/15/2024] thiamine  100 mg Oral Daily   Continuous Infusions:   ceFAZolin  (ANCEF ) IV     tranexamic acid     Labs Reviewed: Sodium 134 Creatinine 1.35 AST 103 ALT 76 GFR 51 CBG ranges from 126-200 mg/dL over the last 24 hours HgbA1c 6.0  NUTRITION - FOCUSED PHYSICAL EXAM:  Flowsheet Row Most Recent Value  Orbital Region Severe depletion  Upper Arm Region Severe depletion  Thoracic and Lumbar Region Severe depletion  Buccal Region Severe depletion  Temple Region Severe depletion  Clavicle Bone Region Severe depletion  Clavicle and Acromion Bone Region Severe depletion  Scapular Bone Region Severe depletion  Dorsal Hand Severe depletion  Patellar Region Severe depletion  Anterior Thigh Region Severe depletion  Posterior Calf Region Severe depletion  Edema (RD Assessment) None  Hair Reviewed  Eyes Reviewed  Mouth Unable to assess  Skin Reviewed  Nails Reviewed    Diet Order:   Diet Order             Diet NPO  time specified Except for: Sips with Meds  Diet effective midnight                   EDUCATION NEEDS:   Not appropriate for education at this time  Skin:  Skin Assessment: Skin Integrity Issues: Skin Integrity Issues:: Incisions Incisions: Closed surgical incision left groin  Last BM:  PTA  Height:   Ht Readings from Last 1 Encounters:  01/04/24 6' 1 (1.854 m)    Weight:   Wt Readings from Last 1 Encounters:  01/14/24 66.8 kg    Ideal Body Weight:  83.6 kg  BMI:  Body mass index is 19.43 kg/m.  Estimated Nutritional Needs:   Kcal:  1800-2000 kcal  Protein:  90-110 gm  Fluid:  >1.8L/day   Olivia Kenning, RD Registered Dietitian  See Amion for more information

## 2024-01-14 NOTE — Progress Notes (Signed)
 Dr. FABIENE Needle aware of elevated BP. No new orders at this time.  Pt A&O to self, place, and situation. Preop questions answered by pt and then verified with pt's daughter.

## 2024-01-14 NOTE — Anesthesia Procedure Notes (Signed)
 Procedure Name: Intubation Date/Time: 01/14/2024 5:38 PM  Performed by: Mannie Krystal LABOR, CRNAPre-anesthesia Checklist: Patient identified, Emergency Drugs available, Suction available and Patient being monitored Patient Re-evaluated:Patient Re-evaluated prior to induction Oxygen Delivery Method: Circle system utilized Preoxygenation: Pre-oxygenation with 100% oxygen Induction Type: IV induction Ventilation: Mask ventilation without difficulty Laryngoscope Size: Miller and 3 Grade View: Grade I Tube type: Oral Tube size: 7.5 mm Number of attempts: 1 Airway Equipment and Method: Stylet and Oral airway Placement Confirmation: ETT inserted through vocal cords under direct vision, positive ETCO2 and breath sounds checked- equal and bilateral Secured at: 22 cm Tube secured with: Tape Dental Injury: Teeth and Oropharynx as per pre-operative assessment

## 2024-01-14 NOTE — Progress Notes (Signed)
 Code Stroke Called at 2254

## 2024-01-14 NOTE — Significant Event (Signed)
 Rapid Response Event Note   Reason for Call :  Aphasia.  Pt s/p R hip surgery and is in PACU.   Note from 1634 says pt was alert and oriented to person, place, and situation.   Initial Focused Assessment:  Pt lying in bed with eyes open, in no visible distress. He will not answer questions or follow commands. When turned he will say no stop and will say yes with continued verbal stimuli.  NIH-20.   HR-92, BP-143/75, RR-18, SpO2-94% on 2.5L [place], CBG-154.   Code Stroke initiated and pt txed to CT.  CT head/CTA negative. Pt NIH improved to 13 while in CT. TNK not given-outside window, recent surgery.  Plan to tx pt to PCU for closer monitoring.    Interventions:  CBG-154 Code Stroke initiated at 2254 CT head-negative for acute changes CTA-no LVO Tx to PCU once PACU recovery completed Plan of Care:  MRI ordered.  Please complete VS/neuro checks(including NIH) q2h x 12h, then q4h. Tx to PCU.   Event Summary:   MD Notified: Toribio READ NP, Marcene, [facility]  MD Call [phone] Arrival [phone] End Upfz:[id number]  Tish Graeme Piety, RN

## 2024-01-14 NOTE — Transfer of Care (Signed)
 Immediate Anesthesia Transfer of Care Note  Patient: Juan Goodwin  Procedure(s) Performed: HEMIARTHROPLASTY (BIPOLAR) HIP, POSTERIOR APPROACH FOR FRACTURE (Right)  Patient Location: PACU  Anesthesia Type:General  Level of Consciousness: awake, alert , and oriented  Airway & Oxygen Therapy: Patient Spontanous Breathing and Patient connected to face mask oxygen  Post-op Assessment: Report given to RN and Post -op Vital signs reviewed and stable  Post vital signs: Reviewed and stable  Last Vitals:  Vitals Value Taken Time  BP    Temp    Pulse    Resp    SpO2      Last Pain:  Vitals:   01/14/24 1628  TempSrc: Oral  PainSc:       Patients Stated Pain Goal: 0 (01/13/24 1801)  Complications: No notable events documented.

## 2024-01-14 NOTE — Progress Notes (Signed)
 PROGRESS NOTE    Juan Goodwin  FMW:[id number] DOB: 11/16/1935 DOA: 01/13/2024 PCP: [name], MD    Brief Narrative:  Juan Goodwin is a 88 y.o. male with past medical history  of  AAA, CAD, CKD, history of subdural hematoma, duodenal ulcer/history of GI bleed, CKD stage IIIa, CAD, hyperlipidemia, essential hypertension presenting today with a fall that happened last night when he went to use the bathroom and fell, daughter helped him up back in bed.  Prior to this he was independent and ambulatory.  Patient fell on his right side and has right hip pain.  Found to have hip fracture with plan for repair on 11/3  Assessment and Plan:  Mechanical fall resulting in Right femoral neck fracture: -N.p.o. with plans for surgery on 11/3 Orthopedic consult - Pain control per orthopedics - PT OT consult    History of CAD/NSTEMI: EKG nonischemic today.  Continue patient on rosuvastatin  metoprolol .   Anemia - Unclear but could be from chronic kidney disease - Will need close follow-up status post surgery - Transfuse for less than 7 or symptomatic   Essential hypertension: Continue patient on his metoprolol and Imdur  - pain control. PRN hydralazine .     CKD stage IIIa: -Trend    Abdominal aortic aneurysm S/P endovascular repair: - 3.2 cm left internal iliac artery aneurysm is noted, and pt is s/p recent infrarenal endovascular aortic repair with coil of the left internal iliac artery on 01/04/2024.  Hyponatremia - Mild - Trend  Hypokalemia - Replete  Mild elevations of LFTs - Trend -Continue statin for now  DVT prophylaxis: SCDs Start: 01/13/24 1538    Code Status: Full Code Family Communication: Spoke with daughter  Disposition Plan:  Level of care: Telemetry Status is: Inpatient     Consultants:  Orthopedics  Subjective: Lives at home with his daughter  Objective: Vitals:   01/14/24 0517 01/14/24 0942 01/14/24 1017 01/14/24 1035  BP: (!) 150/83  (!)  170/98 (!) 165/88  Pulse: 81  78 81  Resp: 18  16 16   Temp: 98.8 F (37.1 C)  98.1 F (36.7 C) 99.1 F (37.3 C)  TempSrc: Oral  Oral Oral  SpO2: 100%  98% 98%  Weight:  66.8 kg      Intake/Output Summary (Last 24 hours) at 01/14/2024 1122 Last data filed at 01/14/2024 0223 Gross per 24 hour  Intake 267.92 ml  Output 375 ml  Net -107.08 ml   Filed Weights   01/14/24 0942  Weight: 66.8 kg    Examination:   General: Appearance:    Thin male in no acute distress     Lungs:     respirations unlabored  Heart:    Normal heart rate. .    MS:   All extremities are intact.    Neurologic:   Awake, alert       Data Reviewed: I have personally reviewed following labs and imaging studies  CBC: Recent Labs  Lab 01/13/24 1334 01/14/24 0404  WBC 6.8 6.5  HGB 9.6* 9.6*  HCT 29.7* 29.1*  MCV 79.2* 77.8*  PLT 225 253   Basic Metabolic Panel: Recent Labs  Lab 01/13/24 1334 01/13/24 1954 01/14/24 0404  NA 134*  --  134*  K 2.7*  --  3.6  CL 96*  --  97*  CO2 26  --  25  GLUCOSE 200*  --  126*  BUN 13  --  12  CREATININE 1.46*  --  1.35*  CALCIUM  8.6*  --  8.5*  MG  --  2.2  --    GFR: Estimated Creatinine Clearance: 37.1 mL/min (A) (by C-G formula based on SCr of 1.35 mg/dL (H)). Liver Function Tests: Recent Labs  Lab 01/13/24 1954  AST 103*  ALT 76*  ALKPHOS 91  BILITOT 0.9  PROT 6.7  ALBUMIN  2.3*   No results for input(s): LIPASE, AMYLASE in the last 168 hours. No results for input(s): AMMONIA in the last 168 hours. Coagulation Profile: No results for input(s): INR, PROTIME in the last 168 hours. Cardiac Enzymes: No results for input(s): CKTOTAL, CKMB, CKMBINDEX, TROPONINI in the last 168 hours. BNP (last 3 results) No results for input(s): PROBNP in the last 8760 hours. HbA1C: Recent Labs    01/13/24 1954  HGBA1C 6.0*   CBG: No results for input(s): GLUCAP in the last 168 hours. Lipid Profile: No results for input(s):  CHOL, HDL, LDLCALC, TRIG, CHOLHDL, LDLDIRECT in the last 72 hours. Thyroid  Function Tests: No results for input(s): TSH, T4TOTAL, FREET4, T3FREE, THYROIDAB in the last 72 hours. Anemia Panel: No results for input(s): VITAMINB12, FOLATE, FERRITIN, TIBC, IRON, RETICCTPCT in the last 72 hours. Sepsis Labs: Recent Labs  Lab 01/13/24 1705  LATICACIDVEN 1.0    Recent Results (from the past 240 hours)  Surgical pcr screen     Status: None   Collection Time: 01/14/24  4:47 AM   Specimen: Nasal Mucosa; Nasal Swab  Result Value Ref Range Status   MRSA, PCR NEGATIVE NEGATIVE Final   Staphylococcus aureus NEGATIVE NEGATIVE Final    Comment: (NOTE) The Xpert SA Assay (FDA approved for NASAL specimens in patients 47 years of age and older), is one component of a comprehensive surveillance program. It is not intended to diagnose infection nor to guide or monitor treatment. Performed at [facility], [address], [place], [place] [address]          Radiology Studies: DG Hip Unilat W or Wo Pelvis 2-3 Views Right Result Date: 01/13/2024 CLINICAL DATA:  Clemens last week, inability to ambulate, right hip pain EXAM: DG HIP (WITH OR WITHOUT PELVIS) 2-3V RIGHT COMPARISON:  11/29/2023 FINDINGS: Frontal view of the pelvis as well as frontal and frogleg lateral views of the right hip are obtained. There is an impacted subcapital right femoral neck fracture with varus angulation at the fracture site. Left hip is unremarkable. Remainder of the bony pelvis appears normal. Embolic coils left hemipelvis. And luminal stent graft within the abdominal aorta and iliac vessels. IMPRESSION: 1. Acute subcapital right femoral neck fracture with impaction and varus angulation. Electronically Signed   By: Ozell Daring M.D.   On: 01/13/2024 14:12        Scheduled Meds:  [START ON 01/15/2024] cholecalciferol  1,000 Units Oral Daily   [START ON 01/15/2024] feeding supplement   237 mL Oral BID BM   Influenza vac split trivalent PF  0.5 mL Intramuscular Tomorrow-1000   isosorbide  mononitrate  30 mg Oral Daily   metoprolol succinate  25 mg Oral Daily   [START ON 01/15/2024] multivitamin with minerals  1 tablet Oral Daily   pneumococcal 20-valent conjugate vaccine  0.5 mL Intramuscular Tomorrow-1000   rosuvastatin   40 mg Oral Daily   [START ON 01/15/2024] thiamine  100 mg Oral Daily   Continuous Infusions:   ceFAZolin  (ANCEF ) IV     tranexamic acid       LOS: 1 day    Time spent: 45 minutes spent on  chart review, discussion with nursing staff, consultants, updating family and interview/physical exam; more than 50% of that time was spent in counseling and/or coordination of care.    Harlene RAYMOND Bowl, DO [facility] Available via Epic secure chat 7am-7pm After these hours, please refer to coverage provider listed on [email or web address] 01/14/2024, 11:22 AM

## 2024-01-14 NOTE — Progress Notes (Signed)
 Report given to surgical nurse prior.

## 2024-01-14 NOTE — Discharge Instructions (Addendum)
 Orthopedic Surgery Discharge Instructions  Patient name: Juan Goodwin Fracture: right femoral neck fracture Procedure Performed: right hip cemented hemiarthroplasty Date of Surgery: 01/14/2024 Surgeon: Ozell Ada, MD  Activity: You are allowed to put as much weight on your leg as you would like. You can walk as much as you would like. Follow the posterior hip precautions that were discussed with you in the hospital. This means you should not flex your hip more than 90 degrees, cross your legs over in bed, internally rotate your hip (so make sure your toes are pointing forward or slightly out). There is a picture below demonstrating the safe zones for moving your hip.   You can perform household activities such as cleaning dishes, doing laundry, vacuuming, etc. as long as you are following the posterior hip precautions. When in bed, use the abduction pillow that you got in the hospital to prevent crossing your legs over in your sleep. The posterior hip precautions will be discontinued at 3 months when there has been enough healing.   Incision Care: Your incision site has a dressing over it. That dressing should remain in place and dry at all times for a total of one week after surgery. After one week, you can remove the dressing. Underneath the dressing, you will find staples. You should leave these staples in place. They will be taken off in the office. Do not pick, rub, or scrub at them. Do not put cream or lotion over the surgical area. After one week and once the dressing is off, it is okay to let soap and water run over your incision. Again, do not pick, scrub, or rub at the skin glue when bathing. Do not submerge (e.g., take a bath, swim, go in a hot tub, etc.) until six weeks after surgery. There may be some bloody drainage from the incision into the dressing after surgery. This is normal. You do not need to replace the dressing. Continue to leave it in place for the one week as instructed  above. Should the dressing become saturated with blood or drainage, please call the office for further instructions.   Medications: You have been prescribed Norco. This is a narcotic pain medication and should only be taken as prescribed. You should not drink alcohol or operate heavy machinery (including driving) while taking this medication. The Norco can cause constipation as a side effect. For that reason, you have been prescribed senna and miralax. These are both laxatives. You do not need to take this medication if you develop diarrhea. Should you remain constipated even while taking the senna and miralax, please use start taking the miralax twice daily. Norco contains tylenol  in it so do not take additional tylenol  when you are using the Norco.   You have been prescribed Xarelto as a blood thinner. This medication is to be taken to prevent blood clots. Take 10 milligrams daily. You should refrain from using other blood thinners (warfarin, apixaban, plavix, xarelto, etc.) while using the Xarelto. You will need to take this medication for a total of 6 weeks after your surgery.   You should not use over-the-counter NSAIDs (ibuprofen, Aleve, Celebrex, naproxen, meloxicam, etc.) for pain relief due to your chronic kidney disease.   In order to set expectations for opioid prescriptions, you will only be prescribed opioids for a total of six weeks after surgery and, at two-weeks after surgery, your opioid prescription will start to tapered (decreased dosage and number of pills). If you have ongoing need for  opioid medication six weeks after surgery, you will be referred to pain management. If you are already established with a provider that is giving you opioid medications, you should schedule an appointment with them for six weeks after surgery if you feel you are going to need another prescription. State law only allows for opioid prescriptions one week at a time. If you are running out of opioid medication  near the end of the week, please call the office during business hours before running out so I can send you another prescription.    Driving: You should not drive while taking narcotic pain medications. You should start getting back to driving slowly and you may want to try driving in a parking lot before doing anything more.   Diet: You are safe to resume your regular diet after surgery.   Reasons to Call the Office After Surgery: You should feel free to call the office with any concerns or questions you have in the post-operative period, but you should definitely notify the office if you develop: -shortness of breath, chest pain, or trouble breathing -excessive bleeding, drainage, redness, or swelling around the surgical site -fevers, chills, or pain that is getting worse with each passing day -persistent nausea or vomiting -new weakness in the right lower extremity, new or worsening numbness or tingling in the right lower extremity -other concerns about your surgery  Follow Up Appointments: You have a follow up appointment with Juan Goodwin on 01/30/2024 at 9:45am. The office location and phone number are listed below. Please arrive on time to your appointment.    Office Information:  -Ozell Georgina, MD -Phone number: [phone] -Address: [address]       [place], [place] [address]

## 2024-01-14 NOTE — Progress Notes (Signed)
 Dr Leopoldo at bedside. PT daughter called Jazmin Vensel) regarding questions pertaining to Patient's mentation prior to surgery. Daughter states she has been concerned since the Patient's fall due to some disorientation. Patient reportedly hit his head at the time of the fall per Daughter. Baseline prior to fall per Daughter: Alert and oriented x4, PT independent with ADLs. Questioned whether Patient experienced arm tremors prior to surgery. Daughter denied arm tremors prior to surgery. Conveyed to Daughter that we would update her when there were further updates regarding plan of care. Daughter verbalized understanding.

## 2024-01-14 NOTE — Progress Notes (Signed)
 Patient left the unit in stable condition via stretcher going down to surgery.

## 2024-01-14 NOTE — Op Note (Signed)
 Orthopedic Surgery Operative Report   Procedure: Right hip cemented hemiarthroplasty for displaced femoral neck fracture   Modifier: none   Date of procedure: 01/14/24   Patient name: Juan Goodwin MRN: [id number] DOB: 1935-06-15   Surgeon: Ozell Ada, MD Assistant: none Pre-operative diagnosis: displaced right femoral neck fracture Post-operative diagnosis: same as above Findings: displaced femoral neck fracture   Specimens: none Anesthesia: general EBL: 250cc Complications: none Pre-incision antibiotic: ancef  TXA was given prior to incision as well   Implants:  Implant Name Type Inv. Item Serial No. Manufacturer Lot No. LRB No. Used Action  CEMENT BONE RALLY HV 40G - [id number] Cement CEMENT BONE RALLY HV 40G  [employer] [id number] Right 2 Implanted  KIT PREP HIP W/CEMENT RESTRICT - [id number] Miscellaneous KIT PREP HIP W/CEMENT ACEY [employer] [id number] Right 1 Implanted  STEM FEMORAL SZ9 110 131D - [id number] Stem STEM FEMORAL SZ9 110 131D  [employer] [id number] Right 1 Implanted  STEM CENTR INVIS DIST FEM 9 - [id number] Stem STEM CENTR INVIS DIST FEM 9  [employer] [id number] Right 1 Implanted  LINER BIPOLAR SHELL 28ID 52OD - [id number] Liner LINER BIPOLAR SHELL 28ID 52OD  [employer] [id number] Right 1 Implanted  HEAD 0 - [id number] Hips HEAD 0  [employer] [id number] Right 1 Implanted          Indication for procedure: Patient is a 88 year old male who had a ground level fall. The patient was complaining of hip pain and was found to have a right femoral neck fracture. I recommended cemented hemiarthroplasty to treat this fracture. Explained the risks of this procedure included, but were not limited to: dislocation, leg length discrepancy, femur fracture, hardware failure, infection, bleeding, stiffness, need for additional procedures, deep vein thrombosis,  MI, pulmonary embolism, and death. The benefits of this procedure would be to allow for early ambulation and mobilization. The alternatives of this surgery would be to treat the fracture with immobilization in bed. I answered all of the patient's and his daughter's questions to their satisfaction. Patient and is daughter elected to proceed with hemiarthroplasty.    Procedure Description: The patient was met in the pre-operative holding area. The patient's identity and consent were verified. The operative site was marked. The patient's remaining questions about surgery were answered. The patient was brought back to the operating room. General anesthesia was induced and an endotracheal tube was placed by the anesthesia staff. The patient was transferred to the operating table in the lateral position on a peg board with the operative hip up. An axillary roll was placed in the axilla of the down arm. Pegs were placed to secure the patient in the lateral position. All bony prominences were well padded. The surgical area was cleansed with chlorhexidine scrub brush and then alcohol. Ancef  was given prior to incision. 1g of TXA was also given before incision. The patient's skin was then prepped and draped in a standard, sterile fashion. A time out was performed that identified the patient, the procedure, and the laterality. All team members agreed with what was stated in the time out.    An incision was made from the base of the greater trochanter to the buttock. This was incision was gently curved posteriorly and centered over the posterior 1/3 of the greater trochanter. Incision was taken sharply down through skin and dermis. Electrocautery was used to dissect through the subcutaneous fat. The fascia  was divided in line with the skin incision.The gluteus was divided with blunt dissection. The leg was internally rotated and a deaver was placed under the gluteus medius. The short external rotator tendons were observed  inserting around the greater trochanter. Electrocautery was used to dissect the short external rotators and capsule off of the proximal femur. An L shaped capsulotomy was made. 2 #2 fiberwire sutures were placed into the capsule and short external rotators. At this point, the fracture was visible within the wound. The lesser trochanter was palpable at the base of the wound. A saw used to make a cut in the neck about 1cm superior to the top of the lesser trochanter. The part of the neck that was cut with the saw was removed with a kocher. A combination of a cobb going around the femoral head and a corkscrew were used to remove the femoral head from the acetabulum. The femoral head measured 52 on the back table. A 52 head trial was inserted into the acetabulum and was felt to have a good fit. The leg was then adducted and internally rotated. A femoral elevator was placed under the proximal femur to expose the proximal femur. A box osteotome was used to open the canal. A canal finder was used to open the canal further. A 8 broach was advanced down the canal. As it was withdrawn, it was used as a rasp along the lateral aspect of the canal to further lateralize. This process was repeated with the broaches until a size 10 broach was reached. At this broach size, there was felt to be a good canal fit. Since the plan was to cement, a size 9 stem was chosen. A trial 52 head and standard neck were placed on the 10 broach that was left in place. The hip was reduced and taken through range of motion. It was felt to be stable. The leg lengths were felt to be symmetric. Plan was made for final implantation of a size 9 stem and a 52 head. The canal was then irrigated with pulse lavage and suctioned dry while cement was being prepared on the back table. A canal restrictor was placed down the canal to be just distal to the length of the final stem. Cement was then pressurized into the canal. A distal centralizer was added to the  final stem. The stem was inserted into the canal and impacted into place. Excess cement was removed from around the stem. The stem handle was used to hold the stem slightly anteverted while the cement dried. I waited about 15 minutes for the cement to completely dry. At that point a trial head and neck were placed onto the final stem. A standard neck and 52 head provided stability through range of motion. The hip was able to flex to 90 degrees and internally rotate to 80 degrees before dislocating. The leg lengths were palpated with that trial in and were felt to be symmetric. A 52 head and standard neck were selected. These final implants were placed over the stem and impacted into place. The hip was then reduced and taken through range of motion. Dislocation was noted at the same position as the trials with the final implants in. Leg lengths again were felt to be similar.    The wound was copiously irrigated with sterile saline via pulse lavage. Vancomycin  powder was placed into the wound. Two drill holes were made in the greater trochanter. The fiberwire that was placed into the capsule and short  external rotators was passed through the drill holes. These sutures were tied over a bone bridge on the greater trochanter with the leg in external rotation. The capsule and short external rotators were able to reapproximated. The fascial layer was closed with 0 vicryl. The deep dermal layer was closed with 2-0 vicryl. The skin was closed with staples. An aquacel dressing was applied. All counts were correct at the end of the case. The patient was transferred back to a hospital bed and a foam abduction pillow was placed between her legs. The patient was extubated and brought back to the post anesthesia care unit in stable condition.    Post-operative plan: The patient will recover in the post-anesthesia care unit and then go to the floor. The patient will receive two post-operative doses of ancef  and another dose of  TXA. The patient will be weight bearing as tolerated with posterior hip precautions. The patient should have an abduction pillow between her legs when in bed. The patient will work with physical therapy. The patient's disposition will be determined by the medicine service.     Ozell Ada, MD Orthopedic Surgeon

## 2024-01-14 NOTE — Progress Notes (Addendum)
 PT transported to CT by Mliss PEAK and [employer] RN

## 2024-01-14 NOTE — Consult Note (Signed)
 NEUROLOGY CONSULT NOTE   Date of service: January 14, 2024 Patient Name: Juan Goodwin MRN:  [id number] DOB:  January 01, 1936 Chief Complaint: ?global aphasia Requesting Provider: Juvenal Harlene PENNER, DO  History of Present Illness  Juan Goodwin is a 88 y.o. male with hx of prior SDH in 2019 with global aphasia that seemed to have resolved, hx of GI bleed, MI, CKD, HTN, who presented after a fall and R femoral neck displaced fracture and underwent surgery for it. Pot op, he was noted to not follow commands and weak all over.  A code stroke was activated with a LKW of 1634 for limited speech outpute. STAT CT Head with no acute abnormalities. CTA with no LVO.  He is unable to provide any history secondary to lethargy and decreased speech output.  LKW: 1634 when he was documented to be oriented x 2. Modified rankin score: unclear. IV Thrombolysis: not offered, prior hx of SDH. EVT: not offered, no LVO.  NIHSS components Score: Comment  1a Level of Conscious 0[]  1[x]  2[]  3[]      1b LOC Questions 0[]  1[]  2[x]       1c LOC Commands 0[]  1[x]  2[]       2 Best Gaze 0[x]  1[]  2[]       3 Visual 0[x]  1[]  2[]  3[]      4 Facial Palsy 0[x]  1[]  2[]  3[]      5a Motor Arm - left 0[]  1[x]  2[]  3[]  4[]  UN[]    5b Motor Arm - Right 0[]  1[x]  2[]  3[]  4[]  UN[]    6a Motor Leg - Left 0[]  1[]  2[]  3[]  4[]  UN[x]  Both legs seem to be fixed and wrapped post op.  6b Motor Leg - Right 0[]  1[]  2[]  3[]  4[]  UN[x]    7 Limb Ataxia 0[x]  1[]  2[]  UN[]      8 Sensory 0[x]  1[]  2[]  UN[]      9 Best Language 0[]  1[]  2[]  3[x]      10 Dysarthria 0[]  1[]  2[x]  UN[]      11 Extinct. and Inattention 0[x]  1[]  2[]       TOTAL: 11      ROS  Unable to ascertain due to limited speech output.  Past History   Past Medical History:  Diagnosis Date   AAA (abdominal aortic aneurysm)    Acute pyloric channel ulcer 07/13/2014   Acute upper GI bleed 07/13/2014   Aortic atherosclerosis    Ascending aorta dilatation    4.3cm by echo 09/2022 and  3.9cm by Chest CT 03/2023.   Blood transfusion without reported diagnosis    CAD (coronary artery disease)    Cerebrovascular disease 10/08/2018   Chronic kidney disease, stage 3a (HCC) 03/30/2017   Dilated aortic root    38 mm by echo with 04/2021   Gastrointestinal hemorrhage with melena 06/2014   Global aphasia 03/27/2017   Helicobacter pylori gastritis 07/13/2014   High cholesterol    Hypernatremia 07/13/2014   Hypertension    Hypokalemia 07/13/2014   Myocardial infarction [facility])    mild heart attack in 2022   Normocytic normochromic anemia 04/10/2017   REM sleep behavior disorder 10/08/2018   Subdural hematoma (HCC) 03/26/2017   Thrombocytopenia 07/13/2014    Past Surgical History:  Procedure Laterality Date   ABDOMINAL AORTIC ENDOVASCULAR STENT GRAFT Bilateral 01/04/2024   Procedure: INSERTION, ENDOVASCULAR STENT GRAFT, AORTA, ABDOMINAL; COIL EMBOLIZATION OF LEFT INTERNAL ILIAC ARTERY ANEURYSM;  Surgeon: Lanis Fonda BRAVO, MD;  Location: [facility] OR;  Service: Vascular;  Laterality: Bilateral;   BRAIN SURGERY  CRANIOTOMY Left 03/26/2017   Procedure: CRANIOTOMY HEMATOMA EVACUATION SUBDURAL;  Surgeon: Onetha Kuba, MD;  Location: [facility] OR;  Service: Neurosurgery;  Laterality: Left;   ESOPHAGOGASTRODUODENOSCOPY N/A 07/11/2014   Procedure: ESOPHAGOGASTRODUODENOSCOPY (EGD);  Surgeon: Jerrell Sol, MD;  Location: [facility];  Service: Endoscopy;  Laterality: N/A;   LEFT HEART CATH AND CORONARY ANGIOGRAPHY N/A 04/26/2020   Procedure: LEFT HEART CATH AND CORONARY ANGIOGRAPHY;  Surgeon: Dann Candyce RAMAN, MD;  Location: [facility] INVASIVE CV LAB;  Service: Cardiovascular;  Laterality: N/A;   ULTRASOUND GUIDANCE FOR VASCULAR ACCESS Bilateral 01/04/2024   Procedure: ULTRASOUND GUIDANCE, FOR VASCULAR ACCESS;  Surgeon: Lanis Fonda BRAVO, MD;  Location: [facility] OR;  Service: Vascular;  Laterality: Bilateral;    Family History: Family History  Problem Relation Age of Onset   Heart failure Mother      Social History  reports that he has quit smoking. His smoking use included cigarettes. He has a 35 pack-year smoking history. He has never used smokeless tobacco. He reports that he does not currently use drugs. He reports that he does not drink alcohol.  Allergies  Allergen Reactions   Cardura [Doxazosin Mesylate] Other (See Comments)    Dizziness  Syncope     Medications   Current Facility-Administered Medications:    bisacodyl (DULCOLAX) EC tablet 5 mg, 5 mg, Oral, Daily PRN, Patel, Ekta V, MD   [START ON 01/15/2024] cholecalciferol (VITAMIN D3) 25 MCG (1000 UNIT) tablet 1,000 Units, 1,000 Units, Oral, Daily, Vann, Jessica U, DO   [START ON 01/15/2024] feeding supplement (ENSURE PLUS HIGH PROTEIN) liquid 237 mL, 237 mL, Oral, BID BM, Vann, Jessica U, DO   hydrALAZINE  (APRESOLINE ) injection 10 mg, 10 mg, Intravenous, Q4H PRN, Tobie Mario GAILS, MD, 10 mg at 01/14/24 0428   HYDROcodone-acetaminophen  (NORCO/VICODIN) 5-325 MG per tablet 1-2 tablet, 1-2 tablet, Oral, Q4H PRN, Tobie Mario GAILS, MD   Influenza vac split trivalent PF (FLUZONE HIGH-DOSE) injection 0.5 mL, 0.5 mL, Intramuscular, Tomorrow-1000, Patel, Mario GAILS, MD   isosorbide  mononitrate (IMDUR ) 24 hr tablet 30 mg, 30 mg, Oral, Daily, Patel, Ekta V, MD, 30 mg at 01/14/24 1035   methocarbamol (ROBAXIN) tablet 500 mg, 500 mg, Oral, Q6H PRN **OR** methocarbamol (ROBAXIN) injection 500 mg, 500 mg, Intravenous, Q6H PRN, Patel, Ekta V, MD   metoprolol succinate (TOPROL-XL) 24 hr tablet 25 mg, 25 mg, Oral, Daily, Patel, Ekta V, MD, 25 mg at 01/14/24 1035   morphine  (PF) 2 MG/ML injection 2 mg, 2 mg, Intravenous, Q2H PRN, Tobie Mario V, MD, 2 mg at 01/14/24 1425   [START ON 01/15/2024] multivitamin with minerals tablet 1 tablet, 1 tablet, Oral, Daily, Vann, Jessica U, DO   pneumococcal 20-valent conjugate vaccine (PREVNAR 20) injection 0.5 mL, 0.5 mL, Intramuscular, Tomorrow-1000, Patel, Ekta V, MD   polyethylene glycol (MIRALAX / GLYCOLAX)  packet 17 g, 17 g, Oral, Daily PRN, Tobie Mario GAILS, MD   rosuvastatin  (CRESTOR ) tablet 40 mg, 40 mg, Oral, Daily, Tobie Mario V, MD, 40 mg at 01/14/24 1035   [START ON 01/15/2024] thiamine (VITAMIN B1) tablet 100 mg, 100 mg, Oral, Daily, Vann, Jessica U, DO   tranexamic acid (CYKLOKAPRON) IVPB 1,000 mg, 1,000 mg, Intravenous, On Call to OR, Georgina Ozell LABOR, MD  Vitals   Vitals:   01/14/24 2105 01/14/24 2120 01/14/24 2135 01/14/24 2150  BP: (!) 143/93 (!) 163/99 (!) 153/93 (!) 122/96  Pulse: 68 98 100   Resp: 18 (!) 22 (!) 26 (!) 27  Temp: (!) 97.4 F (36.3 C)  TempSrc:      SpO2: 100% 95% 97% 95%  Weight:      Height:        Body mass index is 19.43 kg/m.   Physical Exam   General: appears old and frail, laying comfortably in bed; in no acute distress.  HENT: Normal oropharynx and mucosa. Normal external appearance of ears and nose.  Neck: Supple, no pain or tenderness  CV: No JVD. No peripheral edema.  Pulmonary: Symmetric Chest rise. Normal respiratory effort.  Abdomen: Soft to touch, non-tender.  Ext: No cyanosis, edema, or deformity  Skin: No rash. Normal palpation of skin.   Musculoskeletal: Normal digits and nails by inspection. No clubbing.   Neurologic Examination  Mental status/Cognition: awake.  Speech/language: limited speech, oriented to self. Able to follow 1 step commands intermittently after a lot of coaxing. Cranial nerves:   CN II Pupils equal and reactive to light, blinks to threat BL.   CN III,IV,VI EOM intact, no gaze preference or deviation, no nystagmus   CN V normal sensation in V1, V2, and V3 segments bilaterally    CN VII no asymmetry, no nasolabial fold flattening    CN VIII Makes eye contact to speech   CN IX & X Protecting his airway   CN XI Head is midline   CN XII Does not protrude tongue on command.   Motor:  Muscle bulk: poor, tone normal Holds BL upper extremities off the bed on command with slight drift. Limited testing in BL  lower extremities post op. Able to wiggles toes on command.  Sensation:  Light touch    Pin prick Localizes to proximal pinch in all extremities.   Temperature    Vibration   Proprioception    Coordination/Complex Motor:  - Unable to assess.  Labs/Imaging/Neurodiagnostic studies   CBC:  Recent Labs  Lab 27-Jan-2024 1334 01/14/24 0404  WBC 6.8 6.5  HGB 9.6* 9.6*  HCT 29.7* 29.1*  MCV 79.2* 77.8*  PLT 225 253   Basic Metabolic Panel:  Lab Results  Component Value Date   NA 134 (L) 01/14/2024   K 3.6 01/14/2024   CO2 25 01/14/2024   GLUCOSE 126 (H) 01/14/2024   BUN 12 01/14/2024   CREATININE 1.35 (H) 01/14/2024   CALCIUM  8.5 (L) 01/14/2024   GFRNONAA 51 (L) 01/14/2024   GFRAA 46 (L) 06/08/2018   Lipid Panel:  Lab Results  Component Value Date   LDLCALC 66 09/11/2022   HgbA1c:  Lab Results  Component Value Date   HGBA1C 6.0 (H) 2024/01/27   Urine Drug Screen: No results found for: LABOPIA, COCAINSCRNUR, LABBENZ, AMPHETMU, THCU, LABBARB  Alcohol Level No results found for: [facility] INR  Lab Results  Component Value Date   INR 1.2 01/04/2024   APTT  Lab Results  Component Value Date   APTT 42 (H) 01/04/2024   AED levels: No results found for: PHENYTOIN, ZONISAMIDE, LAMOTRIGINE, LEVETIRACETA  CT Head without contrast(Personally reviewed): CTH was negative for a large hypodensity concerning for a large territory infarct or hyperdensity concerning for an ICH  CT angio Head and Neck with contrast(Personally reviewed): No LVO  MRI Brain(Personally reviewed): pending ASSESSMENT   Juan Goodwin is a 88 y.o. male with hx of prior SDH in 2019 with global aphasia that seemed to have resolved, hx of GI bleed, MI, CKD, HTN, who presented after a fall and R femoral neck displaced fracture and underwent surgery for it. Pot op, he was noted to not follow commands  and weak all over.  A code stroke was activated with a LKW of 1634 for limited speech  outpute. STAT CT Head with no acute abnormalities. CTA with no LVO.  Overall, presentation seems to be more concerning for a toxic metabolic etiology and delirium rather than focal finding concerning for stroke. Symptoms could also be secondary to recrudescence of global aphasia from prior SDH/SAH or possible seizure.  RECOMMENDATIONS  - recommend delirium precautions - recommend judicious use of sedating medications, specifically opiods. - rEEG in AM - MRI Brain without contrast routinely when able and when cleared by ortho(not ordered at this time) - TSH, B12, Ammonia, Thiamine, Folate. ______________________________________________________________________  I personally spent a total of 75 minutes in the care of the patient today including preparing to see the patient, getting/reviewing separately obtained history, performing a medically appropriate exam/evaluation, placing orders, documenting clinical information in the EHR, and independently interpreting results.   Signed, Kathyleen Radice, MD [facility]

## 2024-01-14 NOTE — TOC CAGE-AID Note (Signed)
 Transition of Care [facility]) - CAGE-AID Screening   Patient Details  Name: Juan Goodwin MRN: [id number] Date of Birth: Jul 11, 1935  Transition of Care [facility]) CM/SW Contact:    Sriman Tally E Cleave Ternes, LCSW Phone Number: 01/14/2024, 12:49 PM   Clinical Narrative: No SA noted.   CAGE-AID Screening:    Have You Ever Felt You Ought to Cut Down on Your Drinking or Drug Use?: No Have People Annoyed You By Critizing Your Drinking Or Drug Use?: No Have You Felt Bad Or Guilty About Your Drinking Or Drug Use?: No Have You Ever Had a Drink or Used Drugs First Thing In The Morning to Steady Your Nerves or to Get Rid of a Hangover?: No CAGE-AID Score: 0  Substance Abuse Education Offered: No

## 2024-01-14 NOTE — H&P (Addendum)
 Orthopedic Surgery H&P Update  Patient's history and physical reviewed - no updates at this time Risks of surgery were covered again, patient elected to continue with planned procedure Written consent verified Site marked Hold anticoagulation in anticipation of spine surgery Ancef  and TXA on call to OR To OR when ready  EHL/TA/GSC intact on the right side. Foot warm and well perfused. SILT in s/s/dp/sp/t nerve distributions on the right.    Ozell Ada, MD Orthopedic Surgeon

## 2024-01-14 NOTE — Brief Op Note (Signed)
 01/14/2024  9:14 PM  PATIENT:  Rutha LITTIE Ned  88 y.o. male  PRE-OPERATIVE DIAGNOSIS:  RIGHT FEMORAL NECK FRACTURE  POST-OPERATIVE DIAGNOSIS:  RIGHT FEMORAL NECK FRACTURE  PROCEDURE:  Procedure(s): HEMIARTHROPLASTY (BIPOLAR) HIP, POSTERIOR APPROACH FOR FRACTURE (Right)  SURGEON:  Surgeons and Role:    * Georgina Ozell LABOR, MD - Primary  PHYSICIAN ASSISTANT: none  ASSISTANTS: none   ANESTHESIA:   general  EBL:  250 mL   BLOOD ADMINISTERED:none  DRAINS: none   LOCAL MEDICATIONS USED:  NONE  SPECIMEN:  No Specimen  DISPOSITION OF SPECIMEN:  N/A  COUNTS:  YES  TOURNIQUET: NONE  DICTATION: .Note written in EPIC  PLAN OF CARE: Admit to inpatient   PATIENT DISPOSITION:  PACU - hemodynamically stable.   Delay start of Pharmacological VTE agent (>24hrs) due to surgical blood loss or risk of bleeding: no

## 2024-01-15 ENCOUNTER — Encounter (HOSPITAL_COMMUNITY): Payer: Self-pay | Admitting: Internal Medicine

## 2024-01-15 ENCOUNTER — Inpatient Hospital Stay (HOSPITAL_COMMUNITY)

## 2024-01-15 DIAGNOSIS — I69391 Dysphagia following cerebral infarction: Secondary | ICD-10-CM | POA: Diagnosis not present

## 2024-01-15 DIAGNOSIS — E876 Hypokalemia: Secondary | ICD-10-CM | POA: Diagnosis not present

## 2024-01-15 DIAGNOSIS — Z7982 Long term (current) use of aspirin: Secondary | ICD-10-CM

## 2024-01-15 DIAGNOSIS — I69020 Aphasia following nontraumatic subarachnoid hemorrhage: Secondary | ICD-10-CM | POA: Diagnosis not present

## 2024-01-15 DIAGNOSIS — R4182 Altered mental status, unspecified: Secondary | ICD-10-CM

## 2024-01-15 DIAGNOSIS — R569 Unspecified convulsions: Secondary | ICD-10-CM | POA: Diagnosis not present

## 2024-01-15 DIAGNOSIS — S72001A Fracture of unspecified part of neck of right femur, initial encounter for closed fracture: Secondary | ICD-10-CM | POA: Diagnosis not present

## 2024-01-15 DIAGNOSIS — Z87891 Personal history of nicotine dependence: Secondary | ICD-10-CM

## 2024-01-15 DIAGNOSIS — G928 Other toxic encephalopathy: Secondary | ICD-10-CM | POA: Diagnosis not present

## 2024-01-15 LAB — CBC
HCT: 29.8 % — ABNORMAL LOW (ref 39.0–52.0)
Hemoglobin: 9.7 g/dL — ABNORMAL LOW (ref 13.0–17.0)
MCH: 25.3 pg — ABNORMAL LOW (ref 26.0–34.0)
MCHC: 32.6 g/dL (ref 30.0–36.0)
MCV: 77.8 fL — ABNORMAL LOW (ref 80.0–100.0)
Platelets: 295 K/uL (ref 150–400)
RBC: 3.83 MIL/uL — ABNORMAL LOW (ref 4.22–5.81)
RDW: 17 % — ABNORMAL HIGH (ref 11.5–15.5)
WBC: 9.6 K/uL (ref 4.0–10.5)
nRBC: 0 % (ref 0.0–0.2)

## 2024-01-15 LAB — COMPREHENSIVE METABOLIC PANEL WITH GFR
ALT: 69 U/L — ABNORMAL HIGH (ref 0–44)
AST: 88 U/L — ABNORMAL HIGH (ref 15–41)
Albumin: 2.4 g/dL — ABNORMAL LOW (ref 3.5–5.0)
Alkaline Phosphatase: 90 U/L (ref 38–126)
Anion gap: 13 (ref 5–15)
BUN: 21 mg/dL (ref 8–23)
CO2: 24 mmol/L (ref 22–32)
Calcium: 8.5 mg/dL — ABNORMAL LOW (ref 8.9–10.3)
Chloride: 98 mmol/L (ref 98–111)
Creatinine, Ser: 1.86 mg/dL — ABNORMAL HIGH (ref 0.61–1.24)
GFR, Estimated: 35 mL/min — ABNORMAL LOW (ref >60)
Glucose, Bld: 136 mg/dL — ABNORMAL HIGH (ref 70–99)
Potassium: 3.6 mmol/L (ref 3.5–5.1)
Sodium: 135 mmol/L (ref 135–145)
Total Bilirubin: 0.5 mg/dL (ref 0.0–1.2)
Total Protein: 6.8 g/dL (ref 6.5–8.1)

## 2024-01-15 LAB — GLUCOSE, CAPILLARY: Glucose-Capillary: 154 mg/dL — ABNORMAL HIGH (ref 70–99)

## 2024-01-15 LAB — LIPID PANEL
Cholesterol: 91 mg/dL (ref 0–200)
HDL: 27 mg/dL — ABNORMAL LOW (ref >40)
LDL Cholesterol: 53 mg/dL (ref 0–99)
Total CHOL/HDL Ratio: 3.4 ratio
Triglycerides: 53 mg/dL (ref <150)
VLDL: 11 mg/dL (ref 0–40)

## 2024-01-15 LAB — VITAMIN B12: Vitamin B-12: 2699 pg/mL — ABNORMAL HIGH (ref 180–914)

## 2024-01-15 LAB — TSH: TSH: 3.964 u[IU]/mL (ref 0.350–4.500)

## 2024-01-15 LAB — FOLATE: Folate: 18.8 ng/mL (ref >5.9)

## 2024-01-15 MED ORDER — LORAZEPAM 2 MG/ML IJ SOLN
1.0000 mg | Freq: Once | INTRAMUSCULAR | Status: AC | PRN
  Administered 2024-01-15: 1 mg via INTRAVENOUS
  Filled 2024-01-15: qty 1

## 2024-01-15 MED ORDER — ASPIRIN 81 MG PO TBEC: 81.0000 mg | DELAYED_RELEASE_TABLET | Freq: Two times a day (BID) | ORAL | Status: DC

## 2024-01-15 MED ORDER — ASPIRIN 300 MG RE SUPP
300.0000 mg | Freq: Two times a day (BID) | RECTAL | Status: DC
  Administered 2024-01-15: 300 mg via RECTAL
  Filled 2024-01-15 (×4): qty 1

## 2024-01-15 MED ORDER — ASPIRIN 81 MG PO CHEW: 81.0000 mg | CHEWABLE_TABLET | Freq: Every day | ORAL | Status: DC

## 2024-01-15 MED ORDER — ASPIRIN 300 MG RE SUPP
300.0000 mg | Freq: Every day | RECTAL | Status: DC
  Administered 2024-01-15: 300 mg via RECTAL
  Filled 2024-01-15: qty 1

## 2024-01-15 MED ORDER — TRANEXAMIC ACID-NACL 1000-0.7 MG/100ML-% IV SOLN
1000.0000 mg | Freq: Once | INTRAVENOUS | Status: AC
  Administered 2024-01-15: 1000 mg via INTRAVENOUS
  Filled 2024-01-15: qty 100

## 2024-01-15 MED ORDER — ACETAMINOPHEN 10 MG/ML IV SOLN
1000.0000 mg | Freq: Four times a day (QID) | INTRAVENOUS | Status: AC
  Administered 2024-01-15 – 2024-01-16 (×4): 1000 mg via INTRAVENOUS
  Filled 2024-01-15 (×4): qty 100

## 2024-01-15 MED ORDER — SODIUM CHLORIDE 0.9 % IV SOLN: INTRAVENOUS | Status: AC

## 2024-01-15 MED ORDER — MORPHINE SULFATE (PF) 2 MG/ML IV SOLN: 2.0000 mg | INTRAVENOUS | Status: AC | PRN

## 2024-01-15 MED ORDER — ASPIRIN 81 MG PO CHEW
81.0000 mg | CHEWABLE_TABLET | Freq: Two times a day (BID) | ORAL | Status: DC
  Administered 2024-01-16 (×2): 81 mg via ORAL
  Filled 2024-01-15 (×2): qty 1

## 2024-01-15 MED ORDER — CEFAZOLIN SODIUM-DEXTROSE 2-4 GM/100ML-% IV SOLN
2.0000 g | Freq: Four times a day (QID) | INTRAVENOUS | Status: AC
  Administered 2024-01-15 (×3): 2 g via INTRAVENOUS
  Filled 2024-01-15 (×3): qty 100

## 2024-01-15 MED ORDER — ACETAMINOPHEN 500 MG PO TABS: 1000.0000 mg | ORAL_TABLET | Freq: Three times a day (TID) | ORAL | Status: DC

## 2024-01-15 MED ORDER — STROKE: EARLY STAGES OF RECOVERY BOOK
Freq: Once | Status: AC
  Filled 2024-01-15: qty 1

## 2024-01-15 NOTE — Progress Notes (Signed)
 Patient arrived in PACU from OR awake and visibly tremulous. Pill rolling and tremor type movements in arms and hands. Patient unable to answer questions at that time. Per OR Staff report patient was A&O x 4 prior to surgery. 2125 Dr. Georgina at bedside assessing patient and awaiting x ray to be completed. Dr. Georgina assisted in obtaining x rays. 2133 Patient has only answered yeah to whatever question is asked of him. Tremor like movements continue. Pain medication given to assist patient to relax and ensure comfort. 2145 Dr. Medford Custard, CRNA Powell Ruth, and CRNA Krystal Gravely at bedside assisting with patient evaluation. I explained that per report the patient had been A&O x $ prior to surgery but during his time in PACU I had not been able to elicit an appropriate response. Pt continued with tremor like movements that had not been reported prior to surgery. Tremor movements will decrease when holding onto patient but return as soon as he is released. Beckey Chard, RN called patient's daughter to question about mentation prior to surgery. (See note per Chesley Chard, RN). Daughter did express concern over the last few days that he had not responded appropriately but she had wondered if it was the morphine  he had been getting. She stated that she had intended to speak to someone about [id number] Dr. Custard at bedside stated give him about 30 more minutes to wake up more and if he doesn't improve give me a call. 2230 Call Placed to Dr. Custard to inform that patient had not improved. Dr. Custard agreeable to calling Rapid response nurse to initiate Code Stroke to rule out possible bleed. 2231 Rapid Response nurse Graeme Roses, RN called to inform of patient status. 2234 Mindy Rapid Response nurse arrived to PACU to assess patient. Code Stroke called at 2254. 2255 Out of PACU to CT . Per neuro MD the patient will need to be placed in a higher level of care. 2328 Returned to PACU after CT. RR nurse Mindy is contacting [facility] to arrange for placement in higher level of care. 2345 Patient Answered appropriately, and stated that he had to pee. Pt voided 100 ml amber urine in urinal with assistance. 0030 Pt answered name, month, and year of birth correctly. Doesn't remember falling and breaking his hip. Denies pain.Follows commands appropriately. 0145 Report called to 4NP nurse Reine, RN. [phone] Patient transported to 4NP 09 via hospital; bed by myself and Beckey Chard, RN. Patient tolerated transport well. Updated bedside report and visual assessment completed with receiving nurse Nukol, RN.

## 2024-01-15 NOTE — Plan of Care (Signed)
   Problem: Education: Goal: Knowledge of General Education information will improve Description: Including pain rating scale, medication(s)/side effects and non-pharmacologic comfort measures Outcome: Progressing   Problem: Clinical Measurements: Goal: Diagnostic test results will improve Outcome: Progressing   Problem: Elimination: Goal: Will not experience complications related to bowel motility Outcome: Progressing

## 2024-01-15 NOTE — Evaluation (Signed)
 Clinical/Bedside Swallow Evaluation Patient Details  Name: Juan Goodwin MRN: [id number] Date of Birth: 18-Nov-1935  Today's Date: 01/15/2024 Time: SLP Start Time (ACUTE ONLY): 1438 SLP Stop Time (ACUTE ONLY): 1455 SLP Time Calculation (min) (ACUTE ONLY): 17 min  Past Medical History:  Past Medical History:  Diagnosis Date   AAA (abdominal aortic aneurysm)    Acute pyloric channel ulcer 07/13/2014   Acute upper GI bleed 07/13/2014   Aortic atherosclerosis    Ascending aorta dilatation    4.3cm by echo 09/2022 and 3.9cm by Chest CT 03/2023.   Blood transfusion without reported diagnosis    CAD (coronary artery disease)    Cerebrovascular disease 10/08/2018   Chronic kidney disease, stage 3a (HCC) 03/30/2017   Dilated aortic root    38 mm by echo with 04/2021   Gastrointestinal hemorrhage with melena 06/2014   Global aphasia 03/27/2017   Helicobacter pylori gastritis 07/13/2014   High cholesterol    Hypernatremia 07/13/2014   Hypertension    Hypokalemia 07/13/2014   Myocardial infarction [facility])    mild heart attack in 2022   Normocytic normochromic anemia 04/10/2017   REM sleep behavior disorder 10/08/2018   Subdural hematoma (HCC) 03/26/2017   Thrombocytopenia 07/13/2014   Past Surgical History:  Past Surgical History:  Procedure Laterality Date   ABDOMINAL AORTIC ENDOVASCULAR STENT GRAFT Bilateral 01/04/2024   Procedure: INSERTION, ENDOVASCULAR STENT GRAFT, AORTA, ABDOMINAL; COIL EMBOLIZATION OF LEFT INTERNAL ILIAC ARTERY ANEURYSM;  Surgeon: Lanis Fonda BRAVO, MD;  Location: [facility] OR;  Service: Vascular;  Laterality: Bilateral;   BRAIN SURGERY     CRANIOTOMY Left 03/26/2017   Procedure: CRANIOTOMY HEMATOMA EVACUATION SUBDURAL;  Surgeon: Onetha Kuba, MD;  Location: [facility] OR;  Service: Neurosurgery;  Laterality: Left;   ESOPHAGOGASTRODUODENOSCOPY N/A 07/11/2014   Procedure: ESOPHAGOGASTRODUODENOSCOPY (EGD);  Surgeon: Jerrell Sol, MD;  Location: [facility];  Service: Endoscopy;   Laterality: N/A;   HIP ARTHROPLASTY Right 01/14/2024   Procedure: HEMIARTHROPLASTY (BIPOLAR) HIP, POSTERIOR APPROACH FOR FRACTURE;  Surgeon: Georgina Ozell LABOR, MD;  Location: [facility] OR;  Service: Orthopedics;  Laterality: Right;   LEFT HEART CATH AND CORONARY ANGIOGRAPHY N/A 04/26/2020   Procedure: LEFT HEART CATH AND CORONARY ANGIOGRAPHY;  Surgeon: Dann Candyce RAMAN, MD;  Location: [facility] INVASIVE CV LAB;  Service: Cardiovascular;  Laterality: N/A;   ULTRASOUND GUIDANCE FOR VASCULAR ACCESS Bilateral 01/04/2024   Procedure: ULTRASOUND GUIDANCE, FOR VASCULAR ACCESS;  Surgeon: Lanis Fonda BRAVO, MD;  Location: [facility] OR;  Service: Vascular;  Laterality: Bilateral;   HPI:  88 yo male presents to ED 11/2 with fall x1 week ago, subsequent R hip pain and + head trauma. Pt sustained R femoral neck fx s/p hemiarthroplasty (WBAT, posterior hip precautions) 11/3. Code stroke called post-op, CTH and CTA negative for acute findings, MRI negative. PMH includes: aphasia, AAA  s/p recent infrarenal endovascular aortic repair with coil of the left internal iliac artery on 01/04/2024, CAD, CKD, hypertension, MI, SDH s/p craniotomy for evacuation 2019, duodenal ulcer. Previous swallow evals x2 in 2019 both without overt signs of aspiration, mild oral deficits noted.    Assessment / Plan / Recommendation  Clinical Impression  Pt has altered mentation and signs of dysphagia that are both an acute change from his baseline. Recommend that he remain NPO pending improved mentation, at which time PO diet vs MBS can be considered. Daughter at bedside was educated and is in agreement with plan.   Pt is following some commands but also has reduced attention and awareness during PO trials.  He needs frequent cues to initiate bolus acceptance, including cues to open his mouth when presented with a straw or spoon, and he swallows ice chips whole. Regardless of consistency offered today, he has multiple swallows, possibly with some piecemeal  swallowing, but he does not always open his mouth to better try to see. Weak cough and occasional throat clearing noted occasionally but mentation is not yet ready for PO diet anyway.    SLP Visit Diagnosis: Dysphagia, unspecified (R13.10)    Aspiration Risk       Diet Recommendation NPO    Medication Administration: Via alternative means    Other  Recommendations Oral Care Recommendations: Oral care QID Caregiver Recommendations: Have oral suction available     Assistance Recommended at Discharge    Functional Status Assessment Patient has had a recent decline in their functional status and demonstrates the ability to make significant improvements in function in a reasonable and predictable amount of time.  Frequency and Duration min 2x/week  2 weeks       Prognosis Prognosis for improved oropharyngeal function: Good Barriers to Reach Goals: Cognitive deficits      Swallow Study   General HPI: 88 yo male presents to ED 11/2 with fall x1 week ago, subsequent R hip pain and + head trauma. Pt sustained R femoral neck fx s/p hemiarthroplasty (WBAT, posterior hip precautions) 11/3. Code stroke called post-op, CTH and CTA negative for acute findings, MRI negative. PMH includes: aphasia, AAA  s/p recent infrarenal endovascular aortic repair with coil of the left internal iliac artery on 01/04/2024, CAD, CKD, hypertension, MI, SDH s/p craniotomy for evacuation 2019, duodenal ulcer. Previous swallow evals x2 in 2019 both without overt signs of aspiration, mild oral deficits noted. Type of Study: Bedside Swallow Evaluation Previous Swallow Assessment: see HPI Diet Prior to this Study: NPO Temperature Spikes Noted: No Respiratory Status: Room air History of Recent Intubation: Yes Total duration of intubation (days):  (for procedure) Date extubated: 01/14/24 Behavior/Cognition: Lethargic/Drowsy;Cooperative;Requires cueing Oral Cavity Assessment:  (limited visibility) Oral Care Completed  by SLP: No Oral Cavity - Dentition:  (limited visibility) Self-Feeding Abilities: Total assist Patient Positioning: Upright in bed Baseline Vocal Quality: Normal Volitional Cough: Weak Volitional Swallow: Able to elicit    Oral/Motor/Sensory Function Overall Oral Motor/Sensory Function:  (not following commands well for complete assessment, without overt asymmetry)   Ice Chips Ice chips: Impaired Presentation: Spoon Oral Phase Impairments: Impaired mastication;Poor awareness of bolus (swallowed whole) Pharyngeal Phase Impairments: Multiple swallows   Thin Liquid Thin Liquid: Impaired Presentation: Spoon;Straw Oral Phase Impairments: Poor awareness of bolus Pharyngeal  Phase Impairments: Multiple swallows    Nectar Thick Nectar Thick Liquid: Not tested   Honey Thick Honey Thick Liquid: Not tested   Puree Puree: Impaired Presentation: Spoon Oral Phase Impairments: Poor awareness of bolus Pharyngeal Phase Impairments: Multiple swallows   Solid     Solid: Not tested      Leita SAILOR., M.A. CCC-SLP Acute Rehabilitation Services Office: [phone]  Secure chat preferred  01/15/2024,3:01 PM

## 2024-01-15 NOTE — Progress Notes (Signed)
 PROGRESS NOTE    Juan Goodwin  FMW:[id number] DOB: 1936/02/23 DOA: 01/13/2024 PCP: [name], MD    Brief Narrative:  Juan Goodwin is a 88 y.o. male with past medical history  of  AAA, CAD, CKD, history of subdural hematoma, duodenal ulcer/history of GI bleed, CKD stage IIIa, CAD, hyperlipidemia, essential hypertension presenting today with a fall that happened last night when he went to use the bathroom and fell, daughter helped him up back in bed.  Prior to this he was independent and ambulatory.  Patient fell on his right side and has right hip pain.  Found to have hip fracture with  repair on 11/3.  After surgery patient has no acute neurologic changes and was a code stroke.  MRI negative  Assessment and Plan:  Mechanical fall resulting in Right femoral neck fracture: Orthopedic consult -Status post surgery on 11 3 - Pain control per orthopedics - PT OT consult   Altered mental status/neurologic changes status post surgery - Seen by neurology - MRI negative for CVA - EEG pending - Avoid pain medication   History of CAD/NSTEMI: EKG nonischemic today.  Continue patient on rosuvastatin  metoprolol .   Anemia - Unclear but could be from chronic kidney disease - Will need close follow-up status post surgery - Transfuse for less than 7 or symptomatic   Essential hypertension: Continue patient on his metoprolol and Imdur  - pain control. PRN hydralazine .    AKI on CKD stage IIIa: -Gentle IV fluids on 11 floor with recheck of labs in the a.m.    Abdominal aortic aneurysm S/P endovascular repair: - 3.2 cm left internal iliac artery aneurysm is noted, and pt is s/p recent infrarenal endovascular aortic repair with coil of the left internal iliac artery on 01/04/2024.  Hyponatremia - Mild - resolved  Hypokalemia - Repleted  Mild elevations of LFTs - Trending down -Continue statin for now  DVT prophylaxis: SCDs Start: 01/13/24 1538    Code Status: Full  Code Family Communication: Spoke with daughter at bedside   Disposition Plan:  Level of care: Progressive Status is: Inpatient     Consultants:  Orthopedics Neurology  Subjective: Says he is not claustrophobic and will be able to undergo an MRI without issues  Objective: Vitals:   01/15/24 0210 01/15/24 0300 01/15/24 0800 01/15/24 1210  BP: 135/74 119/73 120/66 122/69  Pulse: 86 88 85 94  Resp: 17 (!) 27 20 20   Temp: 97.8 F (36.6 C)  98.7 F (37.1 C) 98.5 F (36.9 C)  TempSrc: Oral  Oral Oral  SpO2: 92% 100% 100% 96%  Weight:      Height:        Intake/Output Summary (Last 24 hours) at 01/15/2024 1227 Last data filed at 01/15/2024 0844 Gross per 24 hour  Intake 1300 ml  Output 500 ml  Net 800 ml   Filed Weights   01/14/24 0942 01/14/24 1438  Weight: 66.8 kg 66.8 kg    Examination:   General: Appearance:    Thin male in no acute distress     Lungs:     respirations unlabored  Heart:    Normal heart rate. .    MS:   All extremities are intact.    Neurologic:   Awake, alert-no current tremors or abnormal movements       Data Reviewed: I have personally reviewed following labs and imaging studies  CBC: Recent Labs  Lab 01/13/24 1334 01/14/24 0404 01/15/24 0251  WBC 6.8 6.5  9.6  HGB 9.6* 9.6* 9.7*  HCT 29.7* 29.1* 29.8*  MCV 79.2* 77.8* 77.8*  PLT 225 253 295   Basic Metabolic Panel: Recent Labs  Lab 01/13/24 1334 01/13/24 1954 01/14/24 0404 01/15/24 0251  NA 134*  --  134* 135  K 2.7*  --  3.6 3.6  CL 96*  --  97* 98  CO2 26  --  25 24  GLUCOSE 200*  --  126* 136*  BUN 13  --  12 21  CREATININE 1.46*  --  1.35* 1.86*  CALCIUM  8.6*  --  8.5* 8.5*  MG  --  2.2  --   --    GFR: Estimated Creatinine Clearance: 26.9 mL/min (A) (by C-G formula based on SCr of 1.86 mg/dL (H)). Liver Function Tests: Recent Labs  Lab 01/13/24 1954 01/15/24 0251  AST 103* 88*  ALT 76* 69*  ALKPHOS 91 90  BILITOT 0.9 0.5  PROT 6.7 6.8  ALBUMIN   2.3* 2.4*   No results for input(s): LIPASE, AMYLASE in the last 168 hours. No results for input(s): AMMONIA in the last 168 hours. Coagulation Profile: No results for input(s): INR, PROTIME in the last 168 hours. Cardiac Enzymes: No results for input(s): CKTOTAL, CKMB, CKMBINDEX, TROPONINI in the last 168 hours. BNP (last 3 results) No results for input(s): PROBNP in the last 8760 hours. HbA1C: Recent Labs    01/13/24 1954  HGBA1C 6.0*   CBG: Recent Labs  Lab 01/14/24 2236  GLUCAP 154*   Lipid Profile: Recent Labs    01/15/24 0855  CHOL 91  HDL 27*  LDLCALC 53  TRIG 53  CHOLHDL 3.4   Thyroid  Function Tests: Recent Labs    01/15/24 0251  TSH 3.964   Anemia Panel: Recent Labs    01/15/24 0251  VITAMINB12 2,699*  FOLATE 18.8   Sepsis Labs: Recent Labs  Lab 01/13/24 1705  LATICACIDVEN 1.0    Recent Results (from the past 240 hours)  Surgical pcr screen     Status: None   Collection Time: 01/14/24  4:47 AM   Specimen: Nasal Mucosa; Nasal Swab  Result Value Ref Range Status   MRSA, PCR NEGATIVE NEGATIVE Final   Staphylococcus aureus NEGATIVE NEGATIVE Final    Comment: (NOTE) The Xpert SA Assay (FDA approved for NASAL specimens in patients 38 years of age and older), is one component of a comprehensive surveillance program. It is not intended to diagnose infection nor to guide or monitor treatment. Performed at [facility], [address], [place], [place] [address]          Radiology Studies: MR BRAIN WO CONTRAST Result Date: 01/15/2024 EXAM: MRI BRAIN WITHOUT CONTRAST 01/15/2024 11:06:00 AM TECHNIQUE: Multiplanar multisequence MRI of the head/brain was performed without the administration of intravenous contrast. COMPARISON: MRI head June 08, 2018 CLINICAL HISTORY: Neuro deficit, acute, stroke suspected FINDINGS: BRAIN AND VENTRICLES: No acute infarct. No acute intracranial hemorrhage. Subtle chronic superficial  siderosis along the left sylvian fissure. No mass. No midline shift. No hydrocephalus. Small remote lacunar infarcts in the basal ganglia and mild for age T2/FLAIR hyperintensities in the white matter, compatible with chronic microvascular ischemic change. Normal flow voids. ORBITS: No acute abnormality. SINUSES AND MASTOIDS: No acute abnormality. BONES AND SOFT TISSUES: Normal marrow signal. IMPRESSION: 1. No acute intracranial abnormality. 2. Similar mild for age chronic microvascular ischemic change. 3. Subtle chronic superficial siderosis along the left sylvian fissure. Electronically signed by: Gilmore Molt MD 01/15/2024 12:08 PM EST  RP Workstation: [id number]   CT ANGIO HEAD NECK W WO CM (CODE STROKE) Result Date: 01/15/2024 EXAM: CTA HEAD AND NECK WITH AND WITHOUT 01/14/2024 11:11:11 PM TECHNIQUE: CTA of the head and neck was performed with and without the administration of 75 mL of intravenous iohexol  (OMNIPAQUE ) 350 MG/ML injection. Multiplanar 2D and/or 3D reformatted images are provided for review. Automated exposure control, iterative reconstruction, and/or weight based adjustment of the mA/kV was utilized to reduce the radiation dose to as low as reasonably achievable. Stenosis of the internal carotid arteries measured using NASCET criteria. COMPARISON: None available CLINICAL HISTORY: Neuro deficit, acute, stroke suspected. FINDINGS: CTA NECK: AORTIC ARCH AND ARCH VESSELS: Standard aortic arch branching pattern with predominantly soft plaque and scattered small areas of plaque ulceration. No dissection or arterial injury. No significant stenosis of the brachiocephalic or subclavian arteries. CERVICAL CAROTID ARTERIES: No dissection, arterial injury, or hemodynamically significant stenosis by NASCET criteria. CERVICAL VERTEBRAL ARTERIES: No dissection, arterial injury, or significant stenosis. LUNGS AND MEDIASTINUM: Unremarkable. SOFT TISSUES: 1.4 cm thyroid  nodule for which no follow up imaging  is recommended. BONES: Moderately advanced cervical disc degeneration and asymmetric right sided facet arthrosis. CTA HEAD: ANTERIOR CIRCULATION: The intracranial internal carotid arteries are patent with diffuse atherosclerotic irregularity but no significant stenosis. ACAs and MCAs are patent with widespread atherosclerotic irregularity but no evidence of a proximal branch occlusion. There are mild right and moderate left M1 stenoses and moderate bilateral M2 stenoses. There is also a moderate distal right A2 stenosis. No aneurysm. POSTERIOR CIRCULATION: The intracranial vertebral arteries are patent to the basilar with mild atherosclerotic irregularity bilaterally as well as a mild stenosis of the distal right V4 segment at the vertebrobasilar junction. The basilar artery is patent with diffuse atherosclerotic irregularity and a moderate stenosis proximally. There are small posterior communicating arteries bilaterally. The PCAs are patent with diffuse atherosclerotic irregularity including moderate narrowing of the right P1 and proximal P2 segments as well as a severe distal left P2 stenosis. No aneurysm. OTHER: No dural venous sinus thrombosis on this non-dedicated study. IMPRESSION: 1. No large vessel occlusion. 2. Widespread intracranial atherosclerosis with multiple predominantly moderate anterior and posterior circulation stenoses as above. 3. Widely patent cervical carotid and vertebral arteries. Electronically signed by: Dasie Hamburg MD 01/15/2024 08:26 AM EST RP Workstation: [id number]   CT HEAD CODE STROKE WO CONTRAST Result Date: 01/14/2024 EXAM: CT HEAD WITHOUT CONTRAST 01/14/2024 11:04:44 PM TECHNIQUE: CT of the head was performed without the administration of intravenous contrast. Automated exposure control, iterative reconstruction, and/or weight based adjustment of the mA/kV was utilized to reduce the radiation dose to as low as reasonably achievable. COMPARISON: CT head 12/29/2017 CLINICAL  HISTORY: Neuro deficit, acute, stroke suspected. FINDINGS: BRAIN AND VENTRICLES: No acute hemorrhage. No evidence of acute infarct. Small remote left caudate and thalamic lacunar infarcts. No hydrocephalus. No extra-axial collection. No mass effect or midline shift. ORBITS: No acute abnormality. SINUSES: No acute abnormality. SOFT TISSUES AND SKULL: No skull fracture. Findings conveyed to Dr. Vanessa via pager at 11:15 PM. IMPRESSION: 1. No acute intracranial abnormality. 2. Small remote left caudate and thalamic lacunar infarcts. Electronically signed by: Gilmore Molt MD 01/14/2024 11:15 PM EST RP Workstation: [id number]   DG Hip Unilat W or Wo Pelvis 2-3 Views Right Result Date: 01/13/2024 CLINICAL DATA:  Clemens last week, inability to ambulate, right hip pain EXAM: DG HIP (WITH OR WITHOUT PELVIS) 2-3V RIGHT COMPARISON:  11/29/2023 FINDINGS: Frontal view of the pelvis as well as frontal and frogleg  lateral views of the right hip are obtained. There is an impacted subcapital right femoral neck fracture with varus angulation at the fracture site. Left hip is unremarkable. Remainder of the bony pelvis appears normal. Embolic coils left hemipelvis. And luminal stent graft within the abdominal aorta and iliac vessels. IMPRESSION: 1. Acute subcapital right femoral neck fracture with impaction and varus angulation. Electronically Signed   By: Ozell Daring M.D.   On: 01/13/2024 14:12        Scheduled Meds:  acetaminophen   1,000 mg Oral TID   aspirin  EC  81 mg Oral BID WC   cholecalciferol  1,000 Units Oral Daily   feeding supplement  237 mL Oral BID BM   Influenza vac split trivalent PF  0.5 mL Intramuscular Tomorrow-1000   isosorbide  mononitrate  30 mg Oral Daily   metoprolol succinate  25 mg Oral Daily   multivitamin with minerals  1 tablet Oral Daily   pneumococcal 20-valent conjugate vaccine  0.5 mL Intramuscular Tomorrow-1000   rosuvastatin   40 mg Oral Daily   thiamine  100 mg Oral Daily    Continuous Infusions:  sodium chloride  75 mL/hr at 01/15/24 0844    ceFAZolin  (ANCEF ) IV 2 g (01/15/24 1211)     LOS: 2 days    Time spent: 45 minutes spent on chart review, discussion with nursing staff, consultants, updating family and interview/physical exam; more than 50% of that time was spent in counseling and/or coordination of care.    Harlene RAYMOND Bowl, DO [facility] Available via Epic secure chat 7am-7pm After these hours, please refer to coverage provider listed on [email or web address] 01/15/2024, 12:27 PM

## 2024-01-15 NOTE — Progress Notes (Signed)
 [facility] night cross cover note:   Patient hospitalized with acute right hip fracture after mechanical fall, admitted to [facility]. This evening he underwent hemiarthroplasty of the right hip. In PACU afterwards he was noted to exhibit evidence of expressive aphasia, prompting code stroke to be called. Responded to by Dr. Vanessa of neurology. CT head showed no e/o acute intracranial process, including no evidence of intracranial hemorrhage or evidence of acute infarct. CTA head and neck was reported to show no evidence of LVO. Neurology did not feel that pt was candidate for TNK at this time. I subsequently received request for consideration for escalating the patient's level of care relative to his current med/tele status on [facility], including need for frequent neurochecks.  Based upon the above, I subsequently placed orders for transfer/change in level of care to PCU.  I also completed orders from the focused ischemic CVA order set, including orders for lipid panel, PT/OT/ST consults, serial neuro checks. A1c was checked on 01/13/24 and noted to be 6.0% at that time. I have kept the patient n.p.o. for now, and will follow for additional neurology recs, including rec for MRI brain for further evaluation of the above. Patient has subsequently been transferred to [facility].    Eva Pore, DO Hospitalist

## 2024-01-15 NOTE — Progress Notes (Signed)
 EEG complete - results pending

## 2024-01-15 NOTE — Progress Notes (Addendum)
 STROKE TEAM PROGRESS NOTE    SIGNIFICANT HOSPITAL EVENTS  Presented to ED 11/2 after falling Saturday AM.  Right femoral neck fx S/p hemiarthroplasty 11/3: Neurology consulted d/t post-op change in mental status CTH: small remote lacunar infarcts 11/4: MRI negative.   INTERIM HISTORY/SUBJECTIVE  Tremors (transient with no clear cause for starting and stopping) on exam, no history of. Daughter at bedside states this started after surgery. EEG pending.   On exam, dysarthric speech, follows commands, oriented to name/month/year, did not recognize daughter at beside at first, but with assistance, he was able to say her name later. He stated he knew she had kids but couldn't name them.  RLE weak due to pain, post-op. No other focal deficits seen.    OBJECTIVE  CBC    Component Value Date/Time   WBC 9.6 01/15/2024 0251   RBC 3.83 (L) 01/15/2024 0251   HGB 9.7 (L) 01/15/2024 0251   HGB 10.5 (L) 12/07/2023 1428   HCT 29.8 (L) 01/15/2024 0251   PLT 295 01/15/2024 0251   PLT 193 12/07/2023 1428   MCV 77.8 (L) 01/15/2024 0251   MCH 25.3 (L) 01/15/2024 0251   MCHC 32.6 01/15/2024 0251   RDW 17.0 (H) 01/15/2024 0251   LYMPHSABS 1.4 12/07/2023 1428   MONOABS 0.3 12/07/2023 1428   EOSABS 0.1 12/07/2023 1428   BASOSABS 0.0 12/07/2023 1428    BMET    Component Value Date/Time   NA 135 01/15/2024 0251   K 3.6 01/15/2024 0251   CL 98 01/15/2024 0251   CO2 24 01/15/2024 0251   GLUCOSE 136 (H) 01/15/2024 0251   BUN 21 01/15/2024 0251   CREATININE 1.86 (H) 01/15/2024 0251   CREATININE 1.43 (H) 11/06/2023 1359   CALCIUM  8.5 (L) 01/15/2024 0251   GFRNONAA 35 (L) 01/15/2024 0251   GFRNONAA 48 (L) 11/06/2023 1359    IMAGING past 24 hours CT ANGIO HEAD NECK W WO CM (CODE STROKE) Result Date: 01/15/2024 EXAM: CTA HEAD AND NECK WITH AND WITHOUT 01/14/2024 11:11:11 PM TECHNIQUE: CTA of the head and neck was performed with and without the administration of 75 mL of intravenous iohexol   (OMNIPAQUE ) 350 MG/ML injection. Multiplanar 2D and/or 3D reformatted images are provided for review. Automated exposure control, iterative reconstruction, and/or weight based adjustment of the mA/kV was utilized to reduce the radiation dose to as low as reasonably achievable. Stenosis of the internal carotid arteries measured using NASCET criteria. COMPARISON: None available CLINICAL HISTORY: Neuro deficit, acute, stroke suspected. FINDINGS: CTA NECK: AORTIC ARCH AND ARCH VESSELS: Standard aortic arch branching pattern with predominantly soft plaque and scattered small areas of plaque ulceration. No dissection or arterial injury. No significant stenosis of the brachiocephalic or subclavian arteries. CERVICAL CAROTID ARTERIES: No dissection, arterial injury, or hemodynamically significant stenosis by NASCET criteria. CERVICAL VERTEBRAL ARTERIES: No dissection, arterial injury, or significant stenosis. LUNGS AND MEDIASTINUM: Unremarkable. SOFT TISSUES: 1.4 cm thyroid  nodule for which no follow up imaging is recommended. BONES: Moderately advanced cervical disc degeneration and asymmetric right sided facet arthrosis. CTA HEAD: ANTERIOR CIRCULATION: The intracranial internal carotid arteries are patent with diffuse atherosclerotic irregularity but no significant stenosis. ACAs and MCAs are patent with widespread atherosclerotic irregularity but no evidence of a proximal branch occlusion. There are mild right and moderate left M1 stenoses and moderate bilateral M2 stenoses. There is also a moderate distal right A2 stenosis. No aneurysm. POSTERIOR CIRCULATION: The intracranial vertebral arteries are patent to the basilar with mild atherosclerotic irregularity bilaterally as well as a  mild stenosis of the distal right V4 segment at the vertebrobasilar junction. The basilar artery is patent with diffuse atherosclerotic irregularity and a moderate stenosis proximally. There are small posterior communicating arteries  bilaterally. The PCAs are patent with diffuse atherosclerotic irregularity including moderate narrowing of the right P1 and proximal P2 segments as well as a severe distal left P2 stenosis. No aneurysm. OTHER: No dural venous sinus thrombosis on this non-dedicated study. IMPRESSION: 1. No large vessel occlusion. 2. Widespread intracranial atherosclerosis with multiple predominantly moderate anterior and posterior circulation stenoses as above. 3. Widely patent cervical carotid and vertebral arteries. Electronically signed by: Dasie Hamburg MD 01/15/2024 08:26 AM EST RP Workstation: [id number]   CT HEAD CODE STROKE WO CONTRAST Result Date: 01/14/2024 EXAM: CT HEAD WITHOUT CONTRAST 01/14/2024 11:04:44 PM TECHNIQUE: CT of the head was performed without the administration of intravenous contrast. Automated exposure control, iterative reconstruction, and/or weight based adjustment of the mA/kV was utilized to reduce the radiation dose to as low as reasonably achievable. COMPARISON: CT head 12/29/2017 CLINICAL HISTORY: Neuro deficit, acute, stroke suspected. FINDINGS: BRAIN AND VENTRICLES: No acute hemorrhage. No evidence of acute infarct. Small remote left caudate and thalamic lacunar infarcts. No hydrocephalus. No extra-axial collection. No mass effect or midline shift. ORBITS: No acute abnormality. SINUSES: No acute abnormality. SOFT TISSUES AND SKULL: No skull fracture. Findings conveyed to Dr. Vanessa via pager at 11:15 PM. IMPRESSION: 1. No acute intracranial abnormality. 2. Small remote left caudate and thalamic lacunar infarcts. Electronically signed by: Gilmore Molt MD 01/14/2024 11:15 PM EST RP Workstation: [id number]    Vitals:   01/15/24 0145 01/15/24 0210 01/15/24 0300 01/15/24 0800  BP: (!) 165/78 135/74 119/73 120/66  Pulse: 83 86 88 85  Resp: 20 17 (!) 27 20  Temp: 98.6 F (37 C) 97.8 F (36.6 C)  98.7 F (37.1 C)  TempSrc:  Oral  Oral  SpO2: 100% 92% 100% 100%  Weight:      Height:          PHYSICAL EXAM General:  Alert, well-nourished, well-developed elderly patient in no acute distress CV: Regular rate and rhythm on monitor Respiratory:  Regular, unlabored respirations on room air  NEURO:  Mental Status: AA. Oriented to place, name, age, month, year. Trouble recognizing daughter at bedside at first.  Speech/Language: expressive aphasia present. Naming, repetition, fluency, and comprehension intact.  Cranial Nerves:  II: PERRL. Visual fields full.  III, IV, VI: EOMI. Eyelids elevate symmetrically.  V: Sensation is intact to light touch and symmetrical to face.  VII: Face is symmetrical resting and smiling VIII: hearing intact to voice. IX, X: Palate elevates symmetrically. Moderate dysarthria   KP:Dynloizm shrug 5/5. XII: tongue is midline without fasciculations. Motor:  RLE: decreased ROM and weakness due to pain post-op. LLE: decreased and weak due to pain  Tone: is normal and bulk is normal intermittent coarse action tremor in both upper extremities.  No flaps.  Tremor is absent at rest.  No cogwheel rigidity. Sensation- Intact to light touch bilaterally. Extinction absent to light touch to DSS.   Coordination: FTN intact bilaterally, HKS: no ataxia in BLE.No drift.  Gait- deferred  Most Recent NIH: 6    ASSESSMENT/PLAN  Mr. IRVING BLOOR is a 88 y.o. male with hx of prior SDH in 2019 with global aphasia that seemed to have resolved, hx of GI bleed, MI, CKD, HTN, who presented after a fall and R femoral neck displaced fracture and underwent surgery for it. Post op, he  was noted to not follow commands and weak all over. 11/3, code stroke was activated with a LKW of 1634 for limited speech output. STAT CT Head with no acute abnormalities. CTA with no LVO. Presentation more concerning for toxic metabolic etiology and delirium rather than focal finding concerning for stroke.  Toxic--Metabolic Encephalopathy versus Recrudescence of previous stroke symptoms  versus Seizure  CT Head without contrast(Personally reviewed): CTH was negative for a large hypodensity concerning for a large territory infarct or hyperdensity concerning for an ICH CT angio Head and Neck with contrast(Personally reviewed): No LVO MRI Brain(Personally reviewed): Pending clearance from ortho LDL 66 HgbA1c 6.0 VTE prophylaxis - per Ortho aspirin  81 mg daily prior to admission, now on aspirin  81 mg daily or ASA suppository daily.  Therapy recommendations:  Pending Disposition:  pending  Hx of Stroke/TIA SDH 2019 Global aphasia that resolved  Hypertension Home meds:  none Stable BP goal: normotension  Hyperlipidemia Home meds:  none LDL 66, goal < 70 High intensity statin not indicated due to LDLwithin goal   Diabetes type II, Pre-diabetic Home meds:  none HgbA1c 6.0, goal < 7.0 CBGs SSI Recommend close follow-up with PCP   Tobacco Abuse Former cigarette smoker Patient smoked a pack a day for 35 years  Dysphagia Patient has post-stroke dysphagia, SLP consulted    Diet   Diet NPO time specified   Advance diet as tolerated  Other Stroke Risk Factors Coronary artery disease Advanced Age  Other Active Problems History of GI bleed  Hospital day # 2  Pt seen by Neuro NP/APP with MD. Note/plan to be edited by MD as needed.    Rocky JAYSON Likes, DNP [facility] Please use AMION for contact information & EPIC for messaging.  I have personally obtained history,examined this patient, reviewed notes, independently viewed imaging studies, participated in medical decision making and plan of care.ROS completed by me personally and pertinent positives fully documented  I have made any additions or clarifications directly to the above note. Agree with note above.  Patient with recent fall and hip fracture underwent surgery and developed altered mental status with tremors likely due to encephalopathy etiologies indeterminate at this time.  MRI is  negative for stroke.  Liver enzymes and renal function is slightly deranged and could contribute.  Recommend check EEG for seizures.  Check comprehensive metabolic panel labs and ammonia.  Expect improvement over the next few days.  No further stroke workup is necessary.  Continue aspirin .  Long discussion patient and daughter at the bedside and answered questions.  Discussed with Dr. Juvenal   I personally spent a total of 50 minutes in the care of the patient today including getting/reviewing separately obtained history, performing a medically appropriate exam/evaluation, counseling and educating, placing orders, referring and communicating with other health care professionals, documenting clinical information in the EHR, independently interpreting results, and coordinating care.        Eather Popp, MD Medical Director [facility] Pager: [phone] 01/15/2024 2:52 PM  To contact Stroke Continuity provider, please refer to [email or web address]. After hours, contact General Neurology

## 2024-01-15 NOTE — Progress Notes (Signed)
 Pt is at MRI at the moment will try back, as schedule allows for EEG.

## 2024-01-15 NOTE — Progress Notes (Signed)
 OT Cancellation Note  Patient Details Name: Juan Goodwin MRN: [id number] DOB: 25-Mar-1935   Cancelled Treatment:    Reason Eval/Treat Not Completed: Patient at procedure or test/ unavailable- EEG setup. Will follow and see as able.   Etta NOVAK, OT Acute Rehabilitation Services Office [phone] Secure Chat Preferred    Etta GORMAN Hope 01/15/2024, 12:51 PM

## 2024-01-15 NOTE — Procedures (Signed)
 Patient Name: Juan Goodwin  MRN: [id number]  Epilepsy Attending: Arlin MALVA Krebs  Referring Physician/Provider: Khaliqdina, Salman, MD  Date: 01/15/2024 Duration: 26.18 mins  Patient history: 88yo M with ams. EEG to evaluate for seizure  Level of alertness: Awake  AEDs during EEG study: None  Technical aspects: This EEG study was done with scalp electrodes positioned according to the 10-20 International system of electrode placement. Electrical activity was reviewed with band pass filter of 1-70Hz , sensitivity of 7 uV/mm, display speed of 25mm/sec with a 60Hz  notched filter applied as appropriate. EEG data were recorded continuously and digitally stored.  Video monitoring was available and reviewed as appropriate.  Description: The posterior dominant rhythm consists of 8-9 Hz activity of moderate voltage (25-35 uV) seen predominantly in posterior head regions, symmetric and reactive to eye opening and eye closing. EEG showed intermittent generalized 3 to 6 Hz theta-delta slowing admixed with 13-15 Hz beta activity distributed symmetrically and diffusely. Hyperventilation and photic stimulation were not performed.     ABNORMALITY - Intermittent slow, generalized  IMPRESSION: This study is suggestive of generalized cerebral dysfunction (encephalopathy). No seizures or epileptiform discharges were seen throughout the recording.  Faustina Gebert O Branch Pacitti

## 2024-01-15 NOTE — Progress Notes (Signed)
 Orthopedic Surgery Progress Note   Assessment: Patient is a 88 y.o. male with right femoral neck fracture s/p hemiarthroplasty   Plan: -Operative plans: complete -NPO as undergoing further work up, okay for diet from ortho perspective -Components are MRI compatible so okay to get MRI today -DVT ppx: aspirin  81mg  BID -Antibiotics: ancef  x2 post-op doses -Weight bearing status: WBAT RLE with posterior hip precautions -Abduction pillow when in bed -Neurology consulted for change in mental status -PT evaluate and treat -Pain control -Dispo: per primary  ___________________________________________________________________________  Subjective: Patient with mental status change after surgery last night. Code stroke was called and neurology evaluated the patient. CT was obtained which did not show evidence of acute abnormality. Neurology recommending MRI. Hip pain well controlled.    Physical Exam:  General: no acute distress, appears stated age Neurologic: alert, answering questions appropriately, following commands Respiratory: unlabored breathing on room air, symmetric chest rise Psychiatric: appropriate affect, normal cadence to speech  MSK:   -Right lower extremity  Dressing over hip c/d/i EHL/TA/GSC intact Plantarflexes and dorsiflexes toes Sensation intact to light touch in sural, saphenous, tibial, deep peroneal, and superficial peroneal nerve distributions Foot warm and well perfused   Yesterday's total administered Morphine  Milligram Equivalents: 91   Patient name: Juan Goodwin Patient MRN: [id number] Date: 01/15/24

## 2024-01-15 NOTE — Progress Notes (Signed)
 PT Cancellation Note  Patient Details Name: Juan Goodwin MRN: [id number] DOB: 1935/03/26   Cancelled Treatment:    Reason Eval/Treat Not Completed: Patient at procedure or test/unavailable - upon PT arrival to room at 1230, EEG being donned. PT unable to check back this date, will check back tomorrow.   Tadan Shill S, PT DPT Acute Rehabilitation Services Secure Chat Preferred  Office [phone]    Juan Goodwin 01/15/2024, 2:49 PM

## 2024-01-15 NOTE — Plan of Care (Signed)
  Problem: Education: Goal: Knowledge of General Education information will improve Description: Including pain rating scale, medication(s)/side effects and non-pharmacologic comfort measures Outcome: Progressing   Problem: Health Behavior/Discharge Planning: Goal: Ability to manage health-related needs will improve Outcome: Progressing   Problem: Clinical Measurements: Goal: Ability to maintain clinical measurements within normal limits will improve Outcome: Progressing Goal: Will remain free from infection Outcome: Progressing Goal: Diagnostic test results will improve Outcome: Progressing Goal: Respiratory complications will improve Outcome: Progressing Goal: Cardiovascular complication will be avoided Outcome: Progressing   Problem: Activity: Goal: Risk for activity intolerance will decrease Outcome: Progressing   Problem: Nutrition: Goal: Adequate nutrition will be maintained Outcome: Progressing   Problem: Coping: Goal: Level of anxiety will decrease Outcome: Progressing   Problem: Elimination: Goal: Will not experience complications related to bowel motility Outcome: Progressing Goal: Will not experience complications related to urinary retention Outcome: Progressing   Problem: Pain Managment: Goal: General experience of comfort will improve and/or be controlled Outcome: Progressing   Problem: Safety: Goal: Ability to remain free from injury will improve Outcome: Progressing   Problem: Skin Integrity: Goal: Risk for impaired skin integrity will decrease Outcome: Progressing   Problem: Education: Goal: Required Educational Video(s) Outcome: Progressing   Problem: Clinical Measurements: Goal: Postoperative complications will be avoided or minimized Outcome: Progressing   Problem: Skin Integrity: Goal: Demonstration of wound healing without infection will improve Outcome: Progressing   Problem: Delirium Goal: STG: Juan Goodwin's ability to perform  activities of daily living will improve Outcome: Progressing Goal: STG: Juan Goodwin's ability to verbalize feelings about their condition will improve Outcome: Progressing

## 2024-01-16 ENCOUNTER — Inpatient Hospital Stay (HOSPITAL_COMMUNITY)

## 2024-01-16 DIAGNOSIS — E43 Unspecified severe protein-calorie malnutrition: Secondary | ICD-10-CM | POA: Diagnosis not present

## 2024-01-16 DIAGNOSIS — R1310 Dysphagia, unspecified: Secondary | ICD-10-CM | POA: Diagnosis not present

## 2024-01-16 DIAGNOSIS — S72001A Fracture of unspecified part of neck of right femur, initial encounter for closed fracture: Secondary | ICD-10-CM | POA: Diagnosis not present

## 2024-01-16 DIAGNOSIS — G928 Other toxic encephalopathy: Secondary | ICD-10-CM | POA: Diagnosis not present

## 2024-01-16 LAB — BASIC METABOLIC PANEL WITH GFR
Anion gap: 15 (ref 5–15)
BUN: 29 mg/dL — ABNORMAL HIGH (ref 8–23)
CO2: 21 mmol/L — ABNORMAL LOW (ref 22–32)
Calcium: 8 mg/dL — ABNORMAL LOW (ref 8.9–10.3)
Chloride: 102 mmol/L (ref 98–111)
Creatinine, Ser: 2.57 mg/dL — ABNORMAL HIGH (ref 0.61–1.24)
GFR, Estimated: 24 mL/min — ABNORMAL LOW (ref >60)
Glucose, Bld: 83 mg/dL (ref 70–99)
Potassium: 3.7 mmol/L (ref 3.5–5.1)
Sodium: 138 mmol/L (ref 135–145)

## 2024-01-16 LAB — URINALYSIS, ROUTINE W REFLEX MICROSCOPIC
Bilirubin Urine: NEGATIVE
Glucose, UA: NEGATIVE mg/dL
Ketones, ur: 5 mg/dL — AB
Leukocytes,Ua: NEGATIVE
Nitrite: NEGATIVE
Protein, ur: NEGATIVE mg/dL
Specific Gravity, Urine: 1.018 (ref 1.005–1.030)
pH: 5 (ref 5.0–8.0)

## 2024-01-16 LAB — CBC
HCT: 25.2 % — ABNORMAL LOW (ref 39.0–52.0)
Hemoglobin: 8.3 g/dL — ABNORMAL LOW (ref 13.0–17.0)
MCH: 25.4 pg — ABNORMAL LOW (ref 26.0–34.0)
MCHC: 32.9 g/dL (ref 30.0–36.0)
MCV: 77.1 fL — ABNORMAL LOW (ref 80.0–100.0)
Platelets: 277 K/uL (ref 150–400)
RBC: 3.27 MIL/uL — ABNORMAL LOW (ref 4.22–5.81)
RDW: 16.7 % — ABNORMAL HIGH (ref 11.5–15.5)
WBC: 7.4 K/uL (ref 4.0–10.5)
nRBC: 0 % (ref 0.0–0.2)

## 2024-01-16 LAB — GLUCOSE, CAPILLARY
Glucose-Capillary: 100 mg/dL — ABNORMAL HIGH (ref 70–99)
Glucose-Capillary: 128 mg/dL — ABNORMAL HIGH (ref 70–99)
Glucose-Capillary: 75 mg/dL (ref 70–99)

## 2024-01-16 LAB — AMMONIA: Ammonia: 13 umol/L (ref 9–35)

## 2024-01-16 MED ORDER — ADULT MULTIVITAMIN W/MINERALS CH
1.0000 | ORAL_TABLET | Freq: Every day | ORAL | Status: DC
  Administered 2024-01-17: 1
  Filled 2024-01-16: qty 1

## 2024-01-16 MED ORDER — PROSOURCE TF20 ENFIT COMPATIBL EN LIQD
60.0000 mL | Freq: Every day | ENTERAL | Status: DC
  Administered 2024-01-16 – 2024-01-18 (×3): 60 mL
  Filled 2024-01-16 (×3): qty 60

## 2024-01-16 MED ORDER — OSMOLITE 1.2 CAL PO LIQD: 1000.0000 mL | ORAL | Status: DC

## 2024-01-16 MED ORDER — VITAMIN D 25 MCG (1000 UNIT) PO TABS
1000.0000 [IU] | ORAL_TABLET | Freq: Every day | ORAL | Status: DC
  Administered 2024-01-17: 1000 [IU]
  Filled 2024-01-16: qty 1

## 2024-01-16 MED ORDER — FREE WATER
100.0000 mL | Status: DC
  Administered 2024-01-16 – 2024-01-18 (×12): 100 mL

## 2024-01-16 MED ORDER — OSMOLITE 1.5 CAL PO LIQD
1000.0000 mL | ORAL | Status: DC
  Administered 2024-01-16 – 2024-01-17 (×2): 1000 mL
  Filled 2024-01-16: qty 1000

## 2024-01-16 MED ORDER — THIAMINE MONONITRATE 100 MG PO TABS
100.0000 mg | ORAL_TABLET | Freq: Every day | ORAL | Status: DC
  Administered 2024-01-17: 100 mg
  Filled 2024-01-16: qty 1

## 2024-01-16 NOTE — Plan of Care (Signed)
  Problem: Education: Goal: Knowledge of General Education information will improve Description: Including pain rating scale, medication(s)/side effects and non-pharmacologic comfort measures Outcome: Progressing   Problem: Health Behavior/Discharge Planning: Goal: Ability to manage health-related needs will improve Outcome: Progressing   Problem: Clinical Measurements: Goal: Ability to maintain clinical measurements within normal limits will improve Outcome: Progressing Goal: Will remain free from infection Outcome: Progressing Goal: Diagnostic test results will improve Outcome: Progressing Goal: Respiratory complications will improve Outcome: Progressing Goal: Cardiovascular complication will be avoided Outcome: Progressing   Problem: Activity: Goal: Risk for activity intolerance will decrease Outcome: Progressing   Problem: Nutrition: Goal: Adequate nutrition will be maintained Outcome: Progressing   Problem: Coping: Goal: Level of anxiety will decrease Outcome: Progressing   Problem: Elimination: Goal: Will not experience complications related to bowel motility Outcome: Progressing Goal: Will not experience complications related to urinary retention Outcome: Progressing   Problem: Pain Managment: Goal: General experience of comfort will improve and/or be controlled Outcome: Progressing   Problem: Safety: Goal: Ability to remain free from injury will improve Outcome: Progressing   Problem: Skin Integrity: Goal: Risk for impaired skin integrity will decrease Outcome: Progressing   Problem: Education: Goal: Required Educational Video(s) Outcome: Progressing   Problem: Clinical Measurements: Goal: Postoperative complications will be avoided or minimized Outcome: Progressing   Problem: Skin Integrity: Goal: Demonstration of wound healing without infection will improve Outcome: Progressing   Problem: Delirium Goal: STG: Juan Goodwin's ability to perform  activities of daily living will improve Outcome: Progressing Goal: STG: Juan Goodwin's ability to verbalize feelings about their condition will improve Outcome: Progressing   Problem: Education: Goal: Knowledge of disease or condition will improve Outcome: Progressing Goal: Knowledge of secondary prevention will improve (MUST DOCUMENT ALL) Outcome: Progressing Goal: Knowledge of patient specific risk factors will improve (DELETE if not current risk factor) Outcome: Progressing   Problem: Ischemic Stroke/TIA Tissue Perfusion: Goal: Complications of ischemic stroke/TIA will be minimized Outcome: Progressing   Problem: Coping: Goal: Will verbalize positive feelings about self Outcome: Progressing Goal: Will identify appropriate support needs Outcome: Progressing   Problem: Health Behavior/Discharge Planning: Goal: Ability to manage health-related needs will improve Outcome: Progressing Goal: Goals will be collaboratively established with patient/family Outcome: Progressing   Problem: Self-Care: Goal: Ability to participate in self-care as condition permits will improve Outcome: Progressing Goal: Verbalization of feelings and concerns over difficulty with self-care will improve Outcome: Progressing Goal: Ability to communicate needs accurately will improve Outcome: Progressing   Problem: Nutrition: Goal: Risk of aspiration will decrease Outcome: Progressing Goal: Dietary intake will improve Outcome: Progressing   Problem: Education: Goal: Verbalization of understanding the information provided (i.e., activity precautions, restrictions, etc) will improve Outcome: Progressing   Problem: Activity: Goal: Ability to ambulate and perform ADLs will improve Outcome: Progressing   Problem: Clinical Measurements: Goal: Postoperative complications will be avoided or minimized Outcome: Progressing   Problem: Self-Concept: Goal: Ability to maintain and perform role responsibilities to  the fullest extent possible will improve Outcome: Progressing   Problem: Pain Management: Goal: Pain level will decrease Outcome: Progressing

## 2024-01-16 NOTE — Progress Notes (Addendum)
 Nutrition Follow-up  DOCUMENTATION CODES:   Severe malnutrition in context of chronic illness  INTERVENTION:  Initiate tube feeding via Cortrak once placed and confirmed: Osmolite 1.5  at 20 ml/h and increase by 10 ml every 8 hours until goal of 55 ml/hr (1320 ml per day) Prosource TF20 60 ml daily 100 ml FWF Q4H  Provides 2060 kcal, 102 gm protein, 1005 ml free water daily (1605 ml water daily TF + FWF)  100 mg Thiamine x 7 days per tube MVI with minerals daily per tube 1,000 units vitamin D  daily per tube Diet advancement per SLP Add ONS as diet allows Monitor magnesium , potassium, and phosphorus daily for at least 3 days, MD to replete as needed, as pt is at risk for refeeding syndrome May need PEG if pt with prolonged AMS if within GOC Recommend starting bowel regimen as pt has not had a BM since 10/21  NUTRITION DIAGNOSIS:   Severe Malnutrition related to chronic illness as evidenced by severe muscle depletion, severe fat depletion. - Ongoing   GOAL:   Patient will meet greater than or equal to 90% of their needs - Progressing   MONITOR:   PO intake, Supplement acceptance, Diet advancement, Labs, Skin  REASON FOR ASSESSMENT:   Consult Assessment of nutrition requirement/status, Hip fracture protocol  ASSESSMENT:  88 y.o. male with PMH of AAA, CAD, CKD 3a, duodenal ulcer/history of GI bleed, CKD stage IIIa, CAD, HLD, HTN,  MI. Admitted after ground level fall and found to have right femoral neck fracture s/p rigth hemiarthoplasty. Post op complicated from AMS, transferred to progressive unit for further evaluation.   11/2 - Imaging of right hip and pelvis showed displaced subcapital femoral neck fracture  11/3 - s/p Right Hemiarthroplasty  11/4 - Change of mental status, code stroke CT negative for acute abnormality, Transferred to progressive, MRI negative, SLP eval->NPO  Pt with AMS following Right Hemiarthroplasty. Code stroke called however imaging revealed no  acute abnormalities. EEG negative for seizures. Ongoing evaluation, Acute metabolic encephalopathy, possibly from worsening renal function.   Pt sleeping on assessment, no family bedisdie, in mitts. Discussed pt with SLP, pt with continued right sided tremors. Pt would answer yes to liquids but then would have oral holding even with cueing, no initiation of swallow. SLP had to suction out liquids. Pt not ready for po diet. Plan for cortrak today, after MD discussed with family.   Dicussed with PT/OT pt was able to sit EOB with total assist, was able to answer some question sbut needed a lot of cueing to be able to answer correctly. Able to say name, place, but not why he was at the hospital. Has aphasia. Mental status will be biggest barrier to pt's po intake. When on diet, likely will need nursing to assist with meals and automatic trays.  Pt has been NPO since admission. Per last RD note, likely pt with poor po intake PTA, severely malnourished. Titrate tube feeds to goal and monitor lytes as pt at refeedinfg risk. No BM since 10/31, messaged MD about adding bowel regimen.   Admit weight: 66.8 kg Current weight: 68.8 kg   Average Meal Intake: NPO  Nutritionally Relevant Medications: Scheduled Meds:  aspirin   81 mg Oral BID   Or   aspirin   300 mg Rectal BID   [START ON 01/17/2024] cholecalciferol  1,000 Units Per Tube Daily   feeding supplement (PROSource TF20)  60 mL Per Tube Daily   Influenza vac split trivalent PF  0.5  mL Intramuscular Tomorrow-1000   isosorbide  mononitrate  30 mg Oral Daily   metoprolol succinate  25 mg Oral Daily   [START ON 01/17/2024] multivitamin with minerals  1 tablet Per Tube Daily   pneumococcal 20-valent conjugate vaccine  0.5 mL Intramuscular Tomorrow-1000   rosuvastatin   40 mg Oral Daily   [START ON 01/17/2024] thiamine  100 mg Per Tube Daily   Continuous Infusions:  acetaminophen  1,000 mg (01/16/24 0600)   feeding supplement (OSMOLITE 1.5 CAL)     Labs  Reviewed: BUN 29 Creatinine 2.57 AST 88 ALT 69 GFR 24 CBG ranges from 83-136 mg/dL over the last 24 hours HgbA1c 6.0  Diet Order:   Diet Order             Diet NPO time specified  Diet effective now                   EDUCATION NEEDS:   Not appropriate for education at this time  Skin:  Skin Assessment: Skin Integrity Issues: Skin Integrity Issues:: Incisions Incisions: Closed surgical incision left groin  Last BM:  10/31  Height:   Ht Readings from Last 1 Encounters:  01/14/24 6' 1 (1.854 m)    Weight:   Wt Readings from Last 1 Encounters:  01/16/24 68.8 kg    Ideal Body Weight:  83.6 kg  BMI:  Body mass index is 20.01 kg/m.  Estimated Nutritional Needs:   Kcal:  1800-2000 kcal  Protein:  90-110 gm  Fluid:  >1.8L/day   Olivia Kenning, RD Registered Dietitian  See Amion for more information

## 2024-01-16 NOTE — Progress Notes (Addendum)
 STROKE TEAM PROGRESS NOTE    SIGNIFICANT HOSPITAL EVENTS Presented to ED 11/2 after falling Saturday AM.  Right femoral neck fx S/p hemiarthroplasty 11/3: Neurology consulted d/t post-op change in mental status CTH: small remote lacunar infarcts 11/4: MRI negative.   INTERIM HISTORY/SUBJECTIVE  No family at the bedside.  Patient is in bilateral mitts.  Patient remains encephalopathic with tremulousness in both hands.  No new neurological events overnight.  Hematocrit is drifting down to 25.2 and renal function is worsening with creatinine now up to 2.57.  Ammonia is normal.  TSH, vitamin B 1, folate and B12 are normal EEG with no seizures. Neurology will sign off   CBC    Component Value Date/Time   WBC 7.4 01/16/2024 0300   RBC 3.27 (L) 01/16/2024 0300   HGB 8.3 (L) 01/16/2024 0300   HGB 10.5 (L) 12/07/2023 1428   HCT 25.2 (L) 01/16/2024 0300   PLT 277 01/16/2024 0300   PLT 193 12/07/2023 1428   MCV 77.1 (L) 01/16/2024 0300   MCH 25.4 (L) 01/16/2024 0300   MCHC 32.9 01/16/2024 0300   RDW 16.7 (H) 01/16/2024 0300   LYMPHSABS 1.4 12/07/2023 1428   MONOABS 0.3 12/07/2023 1428   EOSABS 0.1 12/07/2023 1428   BASOSABS 0.0 12/07/2023 1428    BMET    Component Value Date/Time   NA 138 01/16/2024 0300   K 3.7 01/16/2024 0300   CL 102 01/16/2024 0300   CO2 21 (L) 01/16/2024 0300   GLUCOSE 83 01/16/2024 0300   BUN 29 (H) 01/16/2024 0300   CREATININE 2.57 (H) 01/16/2024 0300   CREATININE 1.43 (H) 11/06/2023 1359   CALCIUM  8.0 (L) 01/16/2024 0300   GFRNONAA 24 (L) 01/16/2024 0300   GFRNONAA 48 (L) 11/06/2023 1359    IMAGING past 24 hours EEG adult Result Date: 01/15/2024 Juan Arlin KIDD, MD     01/15/2024  5:56 PM Patient Name: Juan Goodwin MRN: [id number] Epilepsy Attending: Arlin Goodwin Juan Referring Physician/Provider: Vanessa Robert, MD Date: 01/15/2024 Duration: 26.18 mins Patient history: 88yo M with ams. EEG to evaluate for seizure Level of alertness: Awake AEDs  during EEG study: None Technical aspects: This EEG study was done with scalp electrodes positioned according to the 10-20 International system of electrode placement. Electrical activity was reviewed with band pass filter of 1-70Hz , sensitivity of 7 uV/mm, display speed of 64mm/sec with a 60Hz  notched filter applied as appropriate. EEG data were recorded continuously and digitally stored.  Video monitoring was available and reviewed as appropriate. Description: The posterior dominant rhythm consists of 8-9 Hz activity of moderate voltage (25-35 uV) seen predominantly in posterior head regions, symmetric and reactive to eye opening and eye closing. EEG showed intermittent generalized 3 to 6 Hz theta-delta slowing admixed with 13-15 Hz beta activity distributed symmetrically and diffusely. Hyperventilation and photic stimulation were not performed.   ABNORMALITY - Intermittent slow, generalized IMPRESSION: This study is suggestive of generalized cerebral dysfunction (encephalopathy). No seizures or epileptiform discharges were seen throughout the recording. Arlin Goodwin Juan   MR BRAIN WO CONTRAST Result Date: 01/15/2024 EXAM: MRI BRAIN WITHOUT CONTRAST 01/15/2024 11:06:00 AM TECHNIQUE: Multiplanar multisequence MRI of the head/brain was performed without the administration of intravenous contrast. COMPARISON: MRI head June 08, 2018 CLINICAL HISTORY: Neuro deficit, acute, stroke suspected FINDINGS: BRAIN AND VENTRICLES: No acute infarct. No acute intracranial hemorrhage. Subtle chronic superficial siderosis along the left sylvian fissure. No mass. No midline shift. No hydrocephalus. Small remote lacunar infarcts in the basal ganglia and  mild for age T2/FLAIR hyperintensities in the white matter, compatible with chronic microvascular ischemic change. Normal flow voids. ORBITS: No acute abnormality. SINUSES AND MASTOIDS: No acute abnormality. BONES AND SOFT TISSUES: Normal marrow signal. IMPRESSION: 1. No acute  intracranial abnormality. 2. Similar mild for age chronic microvascular ischemic change. 3. Subtle chronic superficial siderosis along the left sylvian fissure. Electronically signed by: Gilmore Molt MD 01/15/2024 12:08 PM EST RP Workstation: [id number]    Vitals:   01/16/24 0346 01/16/24 0400 01/16/24 0500 01/16/24 0800  BP: (!) 151/86 (!) 153/81  (!) 162/97  Pulse: 80 75  78  Resp: 17 (!) 23  (!) 23  Temp: 98.4 F (36.9 C)   98.4 F (36.9 C)  TempSrc: Axillary   Oral  SpO2: 100% 100%  100%  Weight:   68.8 kg   Height:         PHYSICAL EXAM General:  Alert, well-nourished, well-developed elderly patient in no acute distress CV: Regular rate and rhythm on monitor Respiratory:  Regular, unlabored respirations on room air   NEURO:  Mental Status: AA. Oriented to place, name, age, month, year.  Intermittently following commands Speech/Language: expressive aphasia present. Naming, repetition, fluency, and comprehension intact.   Cranial Nerves:  II: PERRL. Visual fields full.  III, IV, VI: EOMI. Eyelids elevate symmetrically.  V: Sensation is intact to light touch and symmetrical to face.  VII: Face is symmetrical resting and smiling VIII: hearing intact to voice. IX, X: Palate elevates symmetrically. Moderate dysarthria   KP:Dynloizm shrug 5/5. XII: tongue is midline without fasciculations. Motor:  RLE: decreased ROM and weakness due to pain post-op. LLE: decreased and weak due to pain  Tone: is normal and bulk is normal intermittent coarse action tremor in both upper extremities.  No flaps.  Tremor is absent at rest.  No cogwheel rigidity. Sensation- Intact to light touch bilaterally. Extinction absent to light touch to DSS.   Coordination: FTN intact bilaterally, HKS: no ataxia in BLE.No drift.  Gait- deferred   Most Recent NIH: 6   ASSESSMENT/PLAN   Juan Goodwin is a 88 y.o. male with hx of prior SDH in 2019 with global aphasia that seemed to have resolved, hx  of GI bleed, MI, CKD, HTN, who presented after a fall and R femoral neck displaced fracture and underwent surgery for it. Post op, he was noted to not follow commands and weak all over. 11/3, code stroke was activated with a LKW of 1634 for limited speech output. STAT CT Head with no acute abnormalities. CTA with no LVO. Presentation more concerning for toxic metabolic etiology and delirium rather than focal finding concerning for stroke.   Toxic--Metabolic Encephalopathy likely from worsening renal function CT Head without contrast(Personally reviewed): CTH was negative for a large hypodensity concerning for a large territory infarct or hyperdensity concerning for an ICH CT angio Head and Neck with contrast(Personally reviewed): No LVO MRI Brain: 1. No acute intracranial abnormality. 2. Similar mild for age chronic microvascular ischemic change. 3. Subtle chronic superficial siderosis along the left sylvian fissure. LDL 66 HgbA1c 6.0 EEG This study is suggestive of generalized cerebral dysfunction (encephalopathy). No seizures or epileptiform discharges were seen throughout the recording.  VTE prophylaxis - per Ortho aspirin  81 mg daily prior to admission, now on aspirin  81 mg daily or ASA suppository daily.  Therapy recommendations:  Pending Disposition:  pending   Hx of Stroke/TIA SDH 2019 Global aphasia that resolved   Hypertension Home meds:  none Stable  BP goal: normotension   Hyperlipidemia Home meds:  none LDL 66, goal < 70 High intensity statin not indicated due to LDLwithin goal    Diabetes type II, Pre-diabetic Home meds:  none HgbA1c 6.0, goal < 7.0 CBGs SSI Recommend close follow-up with PCP    Tobacco Abuse Former cigarette smoker Patient smoked a pack a day for 35 years   Dysphagia Patient has post-stroke dysphagia, SLP consulted    Diet   Diet NPO time specified   Advance diet as tolerated  Other Stroke Risk Factors Coronary artery disease Advanced  Age   Other Active Problems History of GI bleed  Hospital day # 3   Karna Geralds DNP, ACNPC-AG  [facility]  I have personally obtained history,examined this patient, reviewed notes, independently viewed imaging studies, participated in medical decision making and plan of care.ROS completed by me personally and pertinent positives fully documented  I have made any additions or clarifications directly to the above note. Agree with note above.  Patient with mental status changes post surgery likely encephalopathy now from worsening renal function.  No evidence of stroke or seizures.  Continue reversing worsening medical parameters as per primary team.  Stroke team will sign off.  Kindly call for questions.  Discussed with Dr. Briana Eather Popp, MD Medical Director [facility] Pager: [phone] 01/16/2024 2:52 PM   To contact Stroke Continuity provider, please refer to [email or web address]. After hours, contact General Neurology

## 2024-01-16 NOTE — Hospital Course (Addendum)
 Juan Goodwin is a 88 y.o. male with a history of AAA, CAD, CKD stage IIIa, subdural hematoma, duodenal ulcer, CAD, hyperlipidemia, primary pretension.  Patient presented secondary to a fall and resultant right femoral neck fracture.  Orthopedic surgery was consulted and performed a right hip hemiarthroplasty on 11/3.  Hospitalization complicated by resultant metabolic encephalopathy of unknown etiology which is slowly improved..  Had a temperature earlier on admission to the low.Juan Goodwin  LFTs started worsening so GI was consulted and feel there is ischemic hepatitis and they feel that this is reassuring and self-limited and given his slow improvement in LFTs.  His core track was removed and he is tolerating oral intake and diet has been advanced.  Subsequently hospitalization has also been complicated by worsening renal function and hyperkalemia for which nephrology was consulted for. He is getting close to being medically stable for discharge given his improvement.  PT/OT recommending SNF and has a bed available with insurance authorization and will be discharged today given that he is cleared by the Nephrology team.  He is medically stable for discharge at this time and will need to follow-up with PCP, and orthopedic surgery within 1 to 2 weeks  Assessment and Plan:  Right Femoral Neck Fracture: Secondary to fall. Orthopedic surgery consulted and performed a right hemiarthroplasty on 11/3.  Orthopedic surgery recommendation for weightbearing as tolerated regarding right lower extremity with posterior hip precautions in addition to aspirin  81 mg twice daily for DVT prophylaxis.  Orthopedic surgery is also recommending abduction pillow when in bed and continuing pain control and leave the incision open to air.  They are recommending that that is okay for the soap and water run over her incision but not for submersion. C/w Bowel Regimen and Pain Control.  PT/OT recommending SNF and now will start pursuing given LFTs  improving given that he also has Cortrak removed.    Acute Metabolic Encephalopathy: Presumed diagnosis, although unclear etiology and is improving. Symptoms occurred post-surgery. CT head and MRI brain without evidence of acute intracranial process. EEG obtained and was negative for evidence of seizure activity. Some worsening renal function with BUN up to 32. TSH, Vitamin B12, folate, ammonia level do not explain symptoms (Ammonia Lvl was 31). Mentation continues to improve slowly and appears baseline. Continue to Maintain Delirium Precautions even at SNF    Dysphagia and poor po intake: Secondary to mental status change.  Patient was seen and evaluated by speech therapy who initially recommended n.p.o. status but now on D3 after SLP re-evaluation and will continue at.  Initially failed his calorie count but then improved -Dietitian recommendations (11/11): Encourage PO intake: Nursing to assist with meals Room service with assist  Went over preordering meals with daughter at bedside Increase Ensure Plus High Protein po to TID, each supplement provides 350 kcal and 20 grams of protein Continue Magic cup TID with meals, each supplement provides 290 kcal and 9 grams of protein 100 mg Thiamine x 7 days po MVI with minerals daily po 1,000 units vitamin D  daily po Discontinue Calorie count Remove cortrak tube, discontinue tube feeds  AKI on CKD Stage 3a: Baseline Cr of about 1.3-1.4. This admission, creatinine up to a peak of 2.57 and fluctuated but now at baseline. BUN/Cr Trend is stabilized and improved now and is 32/1.30. Worsened with initial IV fluids. Urine output quantity not documented. Blood pressure appears to have been adequate during surgery without evidence of significant hypotension. Renal ultrasound significant for evidence of medical renal  disease and bladder suggestive of history of retention. Creatinine has reached peak and is trending down. -C/w Strict I's and O's and CTM UOP.  Avoid Nephrotoxic Medications, Contrast Dyes, Hypotension and Dehydration to Ensure Adequate Renal Perfusion and will need to Renally Adjust Meds. CTM & Trend Renal Function carefully & repeat CMP in the AM  -Currently getting IV fluid hydration with fluids but now rate has been reduced to 85 mL/hr and will be continuing x1 Day.  -Nephrology was consulted and feel his renal Fxn is stable but they discontinued some of his blood pressure medications with his blood pressure being soft.  His metoprolol XL has been discontinued and recommendation is to hold Imdur  if his SBP is less than 120.  The goal is to keep his SBP's greater than 150 and 120 for now.  Hyperkalemia: Improved. K+ Trend:  Recent Labs  Lab 01/24/24 0323 01/24/24 1055 01/24/24 1738 01/25/24 0517 01/25/24 1357 01/26/24 0332 01/27/24 0611  K 5.4* 5.5* 5.4* 5.4* 5.0 5.4* 4.9  -Nephrology consulted and recommended Lokelma 10 grams BID; Now getting IVF as below.  Patient's feeding supplements have been adjusted now and has been placed on renal diet.  Follow-up and repeat CMP within 1 week   Hyponatremia: Mild and Resolved. Na+ was 136 on last check. CTM & Trend & repeat CMP in the AM   Abnormal/Elevated LFTs in the setting of ? Liver Cirrhosis: Initially mild but now worsened significantly and now improving. Unclear etiology. AST and ALT Trend now improving: AST went from 931 at its peak and is now 86 and ALT went from 393 at its peak and is now 118 on last check  -Checked RUQ U/S and it showed Echogenic liver parenchyma with subtle nodular appearance of the liver contour raising the question of underlying cirrhosis and  No evidence for cholelithiasis or biliary dilatation.  Acute Hepatitis Panel Non-Reactive.  -Ammonia Lvl normal at 31. GI was consulted for further evaluation and they are recommending continue to monitor LFTs and complete workup with AMA was <20.0, alpha-1 antitrypsin was 267 and ceruloplasmin was 23.9; ASMA pending  F-Actin IgG was 14. GI feels it may may be related to ischemic hepatitis but labs are trending down and reassuring.  GI feels that this is self-limited and should resolve on its own and recommending repeating LFTs in the a.m.   History of CAD: Noted.  Patient with prior NSTEMI.  Patient was on Metoprolol Succinate 25 mg po Daily but now discontinued by Nephrology. Isosorbide  Mononitrate 30 mg po Daily with parameters to hold if SBP <120. Holding Rosuvastatin  given abnormal LFTs. Currently on ASA 81 mg po BID for DVT prophylaxis per Orthopedic Surgery.   Essential Hypertension -> Hypotension: Uncontrolled on admission but BP became soft. Nephrology discontinued Metoprolol Succinate 25 mg po Daily and recommending Holding Isosorbide  Mononitrate 30 mg po Daily if SBP <120. C/w IV Hydralazine  10 mg q4hprn HBP and SBP >160. CTM BP per Protcol. Last BP reading is now 152/86   History of Abdominal Aortic Aneurysm: S/p recent infrarenal endovascular aortic repair with coil of the left internal iliac artery; Will need outpt follow up w/ Vascular Surgery.   Fever: Unclear Etiology but is Resolved; afebrile in the last 24 hours but last Tmax was 99.1. Blood Cx x2 showing NGTD @ 5 Days; DG Chest showed no active Disease. U/A Negative. CTM and Trend and Follow Cx's but he has now been afebrile and has no Leukocytosis   Hypophosphatemia: Phos Lvl is now  2.7. CTM and Replete as Necessary. Repeat Phos in the AM    Chronic Microcytic Anemia: Likely anemia of chronic disease secondary to known CKD. Baseline hemoglobin of around 9-10. Continued to drift down post-operatively to a low of 7.3 and has stabilized. Hgb/Hct Trend fluctuating but now trended down. Last Hgb/Hct is now 7.5/22.9 w/ MCV of 79.0. Anemia panel done and showed an iron of 26, UIBC 106, TIBC 132, saturation 55%, ferritin 597; FOBT is Negative. CTM for S/Sx of Bleeding; No overt bleeding noted. Repeat CBC in the AM and Transfue for Hgb < 7.0    Hypoalbuminemia: Patient's Albumin  Lvl ranging from 1.7-3.2. (Was 1.9 this AM). CTM & Trend & repeat CMP in the AM   Severe Malnutrition in the Context of Chronic illness: Nutrition Status: Nutrition Problem: Severe Malnutrition Etiology: chronic illness Signs/Symptoms: severe muscle depletion, severe fat depletion Interventions: Refer to RD note for recommendations;Cortrak removed 11/12 and Ensure Plus high-protein p.o. 3 times daily changed to Nepro carb steady 237 mL p.o. 3 times daily with meals; continue with Magic cup 3 times daily with meals

## 2024-01-16 NOTE — Evaluation (Signed)
 Physical Therapy Evaluation Patient Details Name: Juan Goodwin MRN: [id number] DOB: Dec 16, 1935 Today's Date: 01/16/2024  History of Present Illness  88 yo male presents to ED 11/2 with fall x1 week ago, subsequent R hip pain and + head trauma. Pt sustained R femoral neck fx s/p hemiarthroplasty (WBAT, posterior hip precautions) 11/3. Code stroke called post-op, CTH and CTA negative for acute findings, MRI negative. EEG negative. PMH includes AAA  s/p recent infrarenal endovascular aortic repair with coil of the left internal iliac artery on 01/04/2024, CAD, CKD, hypertension, MI, SDH, duodenal ulcer.  Clinical Impression  Pt admitted with above diagnosis and presents to PT with functional limitations due to deficits listed below (See PT problem list). Pt needs skilled PT to maximize independence and safety. Pt with significant decline in function after rt hip fx and head trauma. Hopefully lethargy will improve for pt to progress. Patient will benefit from continued inpatient follow up therapy, <3 hours/day.           If plan is discharge home, recommend the following: Two people to help with walking and/or transfers;Two people to help with bathing/dressing/bathroom;Assistance with feeding;Assistance with cooking/housework;Direct supervision/assist for medications management;Direct supervision/assist for financial management;Assist for transportation   Can travel by private vehicle   No    Equipment Recommendations Rolling walker (2 wheels);Wheelchair (measurements PT);Wheelchair cushion (measurements PT)  Recommendations for Other Services       Functional Status Assessment Patient has had a recent decline in their functional status and demonstrates the ability to make significant improvements in function in a reasonable and predictable amount of time.     Precautions / Restrictions Precautions Precautions: Fall;Posterior Hip Precaution Booklet Issued: No Recall of  Precautions/Restrictions: Impaired Precaution/Restrictions Comments: R posterior hip precautions, abduction pillow Restrictions Weight Bearing Restrictions Per Provider Order: Yes RLE Weight Bearing Per Provider Order: Weight bearing as tolerated      Mobility  Bed Mobility Overal bed mobility: Needs Assistance Bed Mobility: Supine to Sit, Sit to Supine     Supine to sit: Total assist, +2 for physical assistance Sit to supine: Total assist, +2 for physical assistance        Transfers                   General transfer comment: deferred    Ambulation/Gait                  Stairs            Wheelchair Mobility     Tilt Bed    Modified Rankin (Stroke Patients Only)       Balance Overall balance assessment: Needs assistance Sitting-balance support: No upper extremity supported, Feet supported Sitting balance-Leahy Scale: Poor Sitting balance - Comments: min to mod assist for sitting balance statically with posterior R lateral lean Postural control: Posterior lean, Right lateral lean                                   Pertinent Vitals/Pain Pain Assessment Pain Assessment: Faces Faces Pain Scale: No hurt Pain Intervention(s): Monitored during session    Home Living Family/patient expects to be discharged to:: Private residence Living Arrangements: Children Available Help at Discharge: Family Type of Home: House Home Access: Level entry       Home Layout: One level   Additional Comments: pt unable to report, per chart from 2022 was living with children in 1 level  home.  Need to confirm    Prior Function Prior Level of Function : Needs assist             Mobility Comments: using cane PTA (cane in room) ADLs Comments: unsure     Extremity/Trunk Assessment   Upper Extremity Assessment Upper Extremity Assessment: Defer to OT evaluation RUE Deficits / Details: pt with inital R UE tremors, able to use UE  functionally to wipe mouth.  grossly 3/5 MMT RUE Coordination: decreased fine motor;decreased gross motor LUE Deficits / Details: pt with some decreased coordination, grossly 3/5.  needs assist to fully bring to face to wipe eyes. rigidity noted. LUE Coordination: decreased fine motor;decreased gross motor    Lower Extremity Assessment Lower Extremity Assessment: RLE deficits/detail;LLE deficits/detail RLE Deficits / Details: tolerates movement with assist but not actively moving on his own LLE Deficits / Details: Assist with movement       Communication   Communication Communication: Impaired Factors Affecting Communication: Hearing impaired;Difficulty expressing self;Reduced clarity of speech    Cognition Arousal: Lethargic Behavior During Therapy: Flat affect   PT - Cognitive impairments: No family/caregiver present to determine baseline, Orientation, Awareness, Attention, Initiation, Problem solving, Safety/Judgement   Orientation impairments: Time, Situation                   PT - Cognition Comments: Rt gaze preference, slow to process Following commands: Impaired Following commands impaired: Follows one step commands with increased time     Cueing Cueing Techniques: Verbal cues, Tactile cues, Visual cues     General Comments General comments (skin integrity, edema, etc.): VSS on RA    Exercises     Assessment/Plan    PT Assessment Patient needs continued PT services  PT Problem List Decreased strength;Decreased activity tolerance;Decreased balance;Decreased mobility;Decreased cognition;Decreased knowledge of precautions       PT Treatment Interventions DME instruction;Gait training;Functional mobility training;Therapeutic activities;Therapeutic exercise;Balance training;Patient/family education    PT Goals (Current goals can be found in the Care Plan section)  Acute Rehab PT Goals Patient Stated Goal: Unable to state PT Goal Formulation: Patient unable  to participate in goal setting Time For Goal Achievement: 01/30/24 Potential to Achieve Goals: Fair    Frequency Min 2X/week     Co-evaluation   Reason for Co-Treatment: For patient/therapist safety;To address functional/ADL transfers PT goals addressed during session: Mobility/safety with mobility OT goals addressed during session: ADL's and self-care       AM-PAC PT 6 Clicks Mobility  Outcome Measure Help needed turning from your back to your side while in a flat bed without using bedrails?: Total Help needed moving from lying on your back to sitting on the side of a flat bed without using bedrails?: Total Help needed moving to and from a bed to a chair (including a wheelchair)?: Total Help needed standing up from a chair using your arms (e.g., wheelchair or bedside chair)?: Total Help needed to walk in hospital room?: Total Help needed climbing 3-5 steps with a railing? : Total 6 Click Score: 6    End of Session   Activity Tolerance: Patient limited by lethargy Patient left: in bed;with call bell/phone within reach;with bed alarm set   PT Visit Diagnosis: Other abnormalities of gait and mobility (R26.89);History of falling (Z91.81);Muscle weakness (generalized) (M62.81);Difficulty in walking, not elsewhere classified (R26.2)    Time: 8949-8886 PT Time Calculation (min) (ACUTE ONLY): 23 min   Charges:   PT Evaluation $PT Eval Moderate Complexity: 1 Mod   PT  General Charges $$ ACUTE PT VISIT: 1 Visit         [facility] PT Acute Rehabilitation Services Office [phone]   Rodgers ORN [facility] 01/16/2024, 1:58 PM

## 2024-01-16 NOTE — Procedures (Signed)
 Cortrak  Person Inserting Tube:  Juan Goodwin, RD Tube Type:  Cortrak - 43 inches Tube Size:  10 Tube Location:  Left nare Secured by: Bridle Initial Placement:  Gastric Technique Used to Measure Tube Placement:  Marking at nare/corner of mouth Cortrak Secured At:  78 cm Initial Placement Verification:  Cortrak device (Registered Dieticians Only)  Cortrak Tube Team Note:  Consult received to place a Cortrak feeding tube.   No x-ray is required. RN may begin using tube.   If the tube becomes dislodged please keep the tube and contact the Cortrak team at [email or web address] for replacement.  If after hours and replacement cannot be delayed, place a NG tube and confirm placement with an abdominal x-ray.    Juan Elihue, MS, RDN, LDN Clinical Dietitian I Please reach out via secure chat

## 2024-01-16 NOTE — Evaluation (Signed)
 Occupational Therapy Evaluation Patient Details Name: Juan Goodwin MRN: [id number] DOB: 05-10-1935 Today's Date: 01/16/2024   History of Present Illness   88 yo male presents to ED 11/2 with fall x1 week ago, subsequent R hip pain and + head trauma. Pt sustained R femoral neck fx s/p hemiarthroplasty (WBAT, posterior hip precautions) 11/3. Code stroke called post-op, CTH and CTA negative for acute findings, MRI negative. EEG negative. PMH includes AAA  s/p recent infrarenal endovascular aortic repair with coil of the left internal iliac artery on 01/04/2024, CAD, CKD, hypertension, MI, SDH, duodenal ulcer.     Clinical Impressions Patient admitted for above and presents with problem list below.  Pt oriented to self, place with choices but not situation or time.  He follows 1 step commands with increased time, improved once sitting EOB and more alert, but difficulty with attention and multiple step commands.  He has R gaze preference, can scan eyes to L side with increased time but to fully turn head needs physical support; able to locate correct # of fingers on both sides.  He has R UE tremor initially, rigid movement of L UE, and poor coordination bilaterally/ grossly weak.  Needing total assist +2 for bed mobility, min-mod assist to sit EOB with posterior R lateral lean, and mod to total assist +2 for Adls. Based on performance today, believe patient will best benefit from continued OT services acutely and after dc at inpatient setting with <3hrs/day to optimize independence, safety with ADLs and mobility.      If plan is discharge home, recommend the following:   Two people to help with walking and/or transfers;Two people to help with bathing/dressing/bathroom;Assistance with cooking/housework;Direct supervision/assist for medications management;Direct supervision/assist for financial management;Assist for transportation;Help with stairs or ramp for entrance;Supervision due to cognitive  status;Assistance with feeding     Functional Status Assessment   Patient has had a recent decline in their functional status and demonstrates the ability to make significant improvements in function in a reasonable and predictable amount of time.     Equipment Recommendations   BSC/3in1;Other (comment) (defer)     Recommendations for Other Services         Precautions/Restrictions   Precautions Precautions: Fall;Posterior Hip Precaution Booklet Issued: No Recall of Precautions/Restrictions: Impaired Precaution/Restrictions Comments: R posterior hip precautions, abduction pillow Restrictions Weight Bearing Restrictions Per Provider Order: Yes RLE Weight Bearing Per Provider Order: Weight bearing as tolerated     Mobility Bed Mobility Overal bed mobility: Needs Assistance Bed Mobility: Supine to Sit, Sit to Supine     Supine to sit: Total assist, +2 for physical assistance Sit to supine: Total assist, +2 for physical assistance        Transfers                   General transfer comment: deferred      Balance Overall balance assessment: Needs assistance Sitting-balance support: No upper extremity supported, Feet supported Sitting balance-Leahy Scale: Poor Sitting balance - Comments: min to mod assist for sitting balance statically with posterior R lateral lean Postural control: Posterior lean, Right lateral lean                                 ADL either performed or assessed with clinical judgement   ADL Overall ADL's : Needs assistance/impaired Eating/Feeding: NPO   Grooming: Moderate assistance;Sitting Grooming Details (indicate cue type and reason): sitting EOB  with support for balance and min-mod assist for grooming tasks.         Upper Body Dressing : Sitting;Maximal assistance   Lower Body Dressing: Total assistance;+2 for physical assistance;Sitting/lateral leans;Bed level     Toilet Transfer Details (indicate cue  type and reason): deferred         Functional mobility during ADLs: Total assistance;+2 for physical assistance       Vision   Vision Assessment?: Vision impaired- to be further tested in functional context Additional Comments: pt with R gaze preference, able to scan towards L side with increased time and increased difficulty with turning head to L side.  unable to sustain.  able to correclty locate # of fingers on L and R.   Continue assessment.     Perception         Praxis         Pertinent Vitals/Pain Pain Assessment Pain Assessment: Faces Faces Pain Scale: No hurt Pain Intervention(s): Monitored during session     Extremity/Trunk Assessment Upper Extremity Assessment Upper Extremity Assessment: RUE deficits/detail;LUE deficits/detail;Right hand dominant RUE Deficits / Details: pt with inital R UE tremors, able to use UE functionally to wipe mouth.  grossly 3/5 MMT RUE Coordination: decreased fine motor;decreased gross motor LUE Deficits / Details: pt with some decreased coordination, grossly 3/5.  needs assist to fully bring to face to wipe eyes. rigidity noted. LUE Coordination: decreased fine motor;decreased gross motor   Lower Extremity Assessment Lower Extremity Assessment: Defer to PT evaluation       Communication Communication Communication: Impaired Factors Affecting Communication: Hearing impaired;Difficulty expressing self;Reduced clarity of speech   Cognition Arousal: Lethargic Behavior During Therapy: Flat affect Cognition: Difficult to assess Difficult to assess due to: Level of arousal           OT - Cognition Comments: pt oriented to self, initally not knowing birthday but once upright and more alert able to state birthday. Able choose correct location when given choices, no awareness of situation.  Able to follow 1 step commands with increased time. Slow processing.                 Following commands: Impaired Following commands  impaired: Follows one step commands with increased time     Cueing  General Comments   Cueing Techniques: Verbal cues;Tactile cues;Visual cues  VSS on RA   Exercises     Shoulder Instructions      Home Living Family/patient expects to be discharged to:: Private residence Living Arrangements: Children Available Help at Discharge: Family                             Additional Comments: pt unable to report, per chart from 2022 was living with children in 1 level home.  Need to confirm      Prior Functioning/Environment Prior Level of Function : Needs assist             Mobility Comments: using cane PTA (cane in room) ADLs Comments: unsure    OT Problem List: Decreased strength;Decreased activity tolerance;Impaired balance (sitting and/or standing);Decreased knowledge of precautions;Decreased knowledge of use of DME or AE;Decreased safety awareness;Decreased cognition;Decreased coordination;Impaired vision/perception   OT Treatment/Interventions: Self-care/ADL training;Therapeutic exercise;DME and/or AE instruction;Therapeutic activities;Cognitive remediation/compensation;Visual/perceptual remediation/compensation;Patient/family education;Balance training;Neuromuscular education      OT Goals(Current goals can be found in the care plan section)   Acute Rehab OT Goals Patient Stated Goal: unable OT Goal Formulation:  Patient unable to participate in goal setting Time For Goal Achievement: 01/30/24 Potential to Achieve Goals: Good   OT Frequency:  Min 2X/week    Co-evaluation PT/OT/SLP Co-Evaluation/Treatment: Yes Reason for Co-Treatment: For patient/therapist safety;To address functional/ADL transfers PT goals addressed during session: Mobility/safety with mobility OT goals addressed during session: ADL's and self-care      AM-PAC OT 6 Clicks Daily Activity     Outcome Measure Help from another person eating meals?: Total Help from another person  taking care of personal grooming?: A Lot Help from another person toileting, which includes using toliet, bedpan, or urinal?: Total Help from another person bathing (including washing, rinsing, drying)?: A Lot Help from another person to put on and taking off regular upper body clothing?: A Lot Help from another person to put on and taking off regular lower body clothing?: Total 6 Click Score: 9   End of Session Nurse Communication: Mobility status;Precautions  Activity Tolerance: Patient tolerated treatment well Patient left: in bed;with call bell/phone within reach;with bed alarm set  OT Visit Diagnosis: Other abnormalities of gait and mobility (R26.89);Muscle weakness (generalized) (M62.81);Other symptoms and signs involving the nervous system (R29.898);Other symptoms and signs involving cognitive function;History of falling (Z91.81);Cognitive communication deficit (R41.841)                Time: 8949-8886 OT Time Calculation (min): 23 min Charges:  OT General Charges $OT Visit: 1 Visit OT Evaluation $OT Eval Moderate Complexity: 1 Mod  Etta NOVAK, OT Acute Rehabilitation Services Office [phone] Secure Chat Preferred    Etta GORMAN [facility] 01/16/2024, 1:32 PM

## 2024-01-16 NOTE — Evaluation (Signed)
 Speech Language Pathology Evaluation Patient Details Name: Juan Goodwin MRN: [id number] DOB: 1935/06/21 Today's Date: 01/16/2024 Time: 9074-9051 SLP Time Calculation (min) (ACUTE ONLY): 23 min  Problem List:  Patient Active Problem List   Diagnosis Date Noted   Toxic metabolic encephalopathy 01/15/2024   Protein-calorie malnutrition, severe 01/14/2024   Closed right hip fracture (HCC) 01/14/2024   Fall 01/13/2024   Closed displaced fracture of right femoral neck (HCC) 01/13/2024   Type 2 diabetes mellitus with diabetic chronic kidney disease (HCC) 01/09/2024   Stage 3 chronic kidney disease (HCC) 01/09/2024   Abdominal aortic aneurysm (AAA) greater than 5.5 cm in diameter in male 01/04/2024   Deficiency anemia 11/06/2023   Sarcopenia 11/06/2023   Chronic constipation 11/06/2023   Aortic atherosclerosis    Ascending aorta dilatation    CAD (coronary artery disease) 10/12/2020   Angina at rest 10/11/2020   Elevated troponin    Chest pain 04/25/2020   NSTEMI (non-ST elevated myocardial infarction) (HCC) 04/25/2020   Hyperlipidemia 08/08/2019   Cerebrovascular disease 10/08/2018   REM sleep behavior disorder 10/08/2018   Weakness 04/10/2017   Dehydration 04/10/2017   Acute kidney injury superimposed on chronic kidney disease 04/10/2017   Normocytic normochromic anemia 04/10/2017   Chronic kidney disease (CKD), stage III (moderate) (HCC) 03/30/2017   HTN (hypertension), malignant 03/29/2017   Orthostatic hypotension 03/27/2017   Global aphasia 03/27/2017   Subdural hematoma (HCC) 03/26/2017   Acute pyloric channel ulcer 07/13/2014   Hypokalemia 07/13/2014   Acute upper GI bleed 07/13/2014   ARF (acute renal failure) 07/13/2014   Hypernatremia 07/13/2014   Hypotension 07/13/2014   Thrombocytopenia 07/13/2014   Helicobacter pylori gastritis 07/13/2014   Gastrointestinal hemorrhage with melena    Melena 07/11/2014   GI bleed 07/11/2014   Acute blood loss anemia 07/10/2014    Past Medical History:  Past Medical History:  Diagnosis Date   AAA (abdominal aortic aneurysm)    Acute pyloric channel ulcer 07/13/2014   Acute upper GI bleed 07/13/2014   Aortic atherosclerosis    Ascending aorta dilatation    4.3cm by echo 09/2022 and 3.9cm by Chest CT 03/2023.   Blood transfusion without reported diagnosis    CAD (coronary artery disease)    Cerebrovascular disease 10/08/2018   Chronic kidney disease, stage 3a (HCC) 03/30/2017   Dilated aortic root    38 mm by echo with 04/2021   Gastrointestinal hemorrhage with melena 06/2014   Global aphasia 03/27/2017   Helicobacter pylori gastritis 07/13/2014   High cholesterol    Hypernatremia 07/13/2014   Hypertension    Hypokalemia 07/13/2014   Myocardial infarction [facility])    mild heart attack in 2022   Normocytic normochromic anemia 04/10/2017   REM sleep behavior disorder 10/08/2018   Subdural hematoma (HCC) 03/26/2017   Thrombocytopenia 07/13/2014   Past Surgical History:  Past Surgical History:  Procedure Laterality Date   ABDOMINAL AORTIC ENDOVASCULAR STENT GRAFT Bilateral 01/04/2024   Procedure: INSERTION, ENDOVASCULAR STENT GRAFT, AORTA, ABDOMINAL; COIL EMBOLIZATION OF LEFT INTERNAL ILIAC ARTERY ANEURYSM;  Surgeon: Lanis Fonda BRAVO, MD;  Location: [facility] OR;  Service: Vascular;  Laterality: Bilateral;   BRAIN SURGERY     CRANIOTOMY Left 03/26/2017   Procedure: CRANIOTOMY HEMATOMA EVACUATION SUBDURAL;  Surgeon: Onetha Kuba, MD;  Location: [facility] OR;  Service: Neurosurgery;  Laterality: Left;   ESOPHAGOGASTRODUODENOSCOPY N/A 07/11/2014   Procedure: ESOPHAGOGASTRODUODENOSCOPY (EGD);  Surgeon: Jerrell Sol, MD;  Location: [facility];  Service: Endoscopy;  Laterality: N/A;   HIP ARTHROPLASTY Right 01/14/2024  Procedure: HEMIARTHROPLASTY (BIPOLAR) HIP, POSTERIOR APPROACH FOR FRACTURE;  Surgeon: Georgina Ozell LABOR, MD;  Location: [facility] OR;  Service: Orthopedics;  Laterality: Right;   LEFT HEART CATH AND CORONARY  ANGIOGRAPHY N/A 04/26/2020   Procedure: LEFT HEART CATH AND CORONARY ANGIOGRAPHY;  Surgeon: Dann Candyce RAMAN, MD;  Location: [facility] INVASIVE CV LAB;  Service: Cardiovascular;  Laterality: N/A;   ULTRASOUND GUIDANCE FOR VASCULAR ACCESS Bilateral 01/04/2024   Procedure: ULTRASOUND GUIDANCE, FOR VASCULAR ACCESS;  Surgeon: Lanis Fonda BRAVO, MD;  Location: [facility] OR;  Service: Vascular;  Laterality: Bilateral;   HPI:  88 yo male presents to ED 11/2 with fall x1 week ago, subsequent R hip pain and + head trauma. Pt sustained R femoral neck fx s/p hemiarthroplasty (WBAT, posterior hip precautions) 11/3. Code stroke called post-op, CTH and CTA negative for acute findings, MRI negative. Pt cognition relatively normal at baseline, slow processing reported.  PMH includes: aphasia, AAA  s/p recent infrarenal endovascular aortic repair with coil of the left internal iliac artery on 01/04/2024, CAD, CKD, hypertension, MI, SDH s/p craniotomy for evacuation 2019, duodenal ulcer. Previous swallow evals x2 in 2019 both without overt signs of aspiration, mild oral deficits noted.   Assessment / Plan / Recommendation Clinical Impression  Pt demonstrates significant cognitive linguistic impairments. Pt awake, verbalizes yes to simple questions when hearing aid in place, but there is no accuracy with Y/N questions. Pt cannot follow commands consistently, but does seem to intermittently follow repeated visual and verbal cues for open your mouth.  Increasing pts activity increases his right handed tremor. Tremor does not break when right hand is recruited for simple motor task like holding a cup. Given no verbalization other than yes, suspect some persistent aphasia which was documented in other providers notes.    SLP Assessment  SLP Recommendation/Assessment: Patient needs continued Speech Language Pathology Services SLP Visit Diagnosis: Cognitive communication deficit (R41.841)     Assistance Recommended at Discharge   Frequent or constant Supervision/Assistance  Functional Status Assessment Patient has had a recent decline in their functional status and demonstrates the ability to make significant improvements in function in a reasonable and predictable amount of time.  Frequency and Duration min 2x/week  2 weeks      SLP Evaluation Cognition  Overall Cognitive Status: Impaired/Different from baseline Arousal/Alertness: Awake/alert Orientation Level: Oriented to person;Disoriented to place;Disoriented to time Attention: Focused Focused Attention: Impaired Focused Attention Impairment: Verbal basic;Functional basic Behaviors: Physical agitation       Comprehension  Auditory Comprehension Overall Auditory Comprehension: Impaired Yes/No Questions: Impaired Basic Biographical Questions: 26-50% accurate Commands: Impaired One Step Basic Commands: 0-24% accurate Interfering Components: Anxiety;Motor planning    Expression Verbal Expression Overall Verbal Expression: Impaired Initiation: Impaired Automatic Speech:  (yes) Level of Generative/Spontaneous Verbalization: Word Repetition: Impaired Level of Impairment: Word level   Oral / Motor  Oral Motor/Sensory Function Overall Oral Motor/Sensory Function: Moderate impairment Facial ROM: Reduced left;Suspected CN VII (facial) dysfunction Facial Symmetry: Abnormal symmetry right Facial Strength: Reduced right Motor Speech Overall Motor Speech: Impaired Phonation: Low vocal intensity            Ladesha Pacini, [name] 01/16/2024, 10:25 AM

## 2024-01-16 NOTE — Progress Notes (Signed)
 Speech Language Pathology Treatment: Dysphagia  Patient Details Name: Juan Goodwin MRN: [id number] DOB: 1936-01-05 Today's Date: 01/16/2024 Time: 9074-9051 SLP Time Calculation (min) (ACUTE ONLY): 23 min  Assessment / Plan / Recommendation Clinical Impression  Pt wakes up when spoken to, but right sided tremors immediately begin. SLP placed charged hearing aid on right and pt indicated he could hear. Offered tastes of water when pt nodded yes to water and accepted small sips from a spoon though anterior spillage and ultimately oral holding with fear and increased tremoring observed. Pts ability to swallow expected to improve with mental status and management of involuntary movement. Pt will need alternate method of nutrition in the meantime. Reported to MD and RN.    HPI HPI: 88 yo male presents to ED 11/2 with fall x1 week ago, subsequent R hip pain and + head trauma. Pt sustained R femoral neck fx s/p hemiarthroplasty (WBAT, posterior hip precautions) 11/3. Code stroke called post-op, CTH and CTA negative for acute findings, MRI negative. PMH includes: aphasia, AAA  s/p recent infrarenal endovascular aortic repair with coil of the left internal iliac artery on 01/04/2024, CAD, CKD, hypertension, MI, SDH s/p craniotomy for evacuation 2019, duodenal ulcer. Previous swallow evals x2 in 2019 both without overt signs of aspiration, mild oral deficits noted.      SLP Plan  Continue with current plan of care          Recommendations  Diet recommendations: NPO                  Oral care QID   Frequent or constant Supervision/Assistance Dysphagia, unspecified (R13.10)     Continue with current plan of care     [name], [name]  01/16/2024, 9:57 AM

## 2024-01-16 NOTE — Progress Notes (Signed)
 PROGRESS NOTE    Juan Goodwin  FMW:[id number] DOB: 1935/08/22 DOA: 01/13/2024 PCP: [name], MD   Brief Narrative: Juan Goodwin is a 88 y.o. male with a history of AAA, CAD, CKD stage IIIa, subdural hematoma, duodenal ulcer, CAD, hyperlipidemia, primary pretension.  Patient presented secondary to a fall and resultant right femoral neck fracture.  Orthopedic surgery was consulted and performed a right hip hemiarthroplasty on 11/3.  Hospitalization complicated by resultant metabolic encephalopathy of unknown etiology.   Assessment and Plan:  Right femoral neck fracture Secondary to fall. Orthopedic surgery consulted and performed a right hemiarthroplasty on 11/3.  Orthopedic surgery recommendation for weightbearing as tolerated regarding right lower extremity with posterior hip precautions in addition to aspirin  81 mg twice daily for DVT prophylaxis.  Acute metabolic encephalopathy Presumed diagnosis, although unclear etiology. Symptoms occurred post-surgery. CT head and MRI brain without evidence of acute intracranial process. EEG obtained and was negative for evidence of seizure activity. Some worsening renal function with BUN up to 29. TSH, Vitamin B12, folate, ammonia level do not explain symptoms.  AKI on CKD stage IIIa Baseline creatinine of about 1.3-1.4. This admission, creatinine up to 2.57. Worsening with IV fluids. Urine output quantity not documented. Blood pressure appears to have been adequate during surgery without evidence of significant hypotension. -Check renal ultrasound -Strict in/out -Urinalysis  Dysphagia Secondary to mental status change.  Patient is seen and evaluated by speech therapy who recommends n.p.o. status.  Discussed with family and decision made to have Cortrak feeding tube placed temporarily. -Cortrak NG tube - Dietitian consultation  Hyponatremia Mild. Resolved.  Elevated LFTs Mild. Unclear etiology but improving.  History of  CAD Noted.  Patient with prior NSTEMI.  Patient is on metoprolol and Crestor  as an outpatient.  Currently on aspirin  for DVT prophylaxis per orthopedic surgery.  Chronic anemia Likely anemia of chronic disease secondary to known CKD. Baseline hemoglobin of around 9-10. Slight drift downward. -Trend CBC  Primary hypertension Uncontrolled on admission.  History of abdominal aortic aneurysm Patient is status post recent infrarenal endovascular aortic repair with coil of the left internal iliac artery    DVT prophylaxis: Per orthopedic surgery: aspirin  81 mg BID [on suppositories secondary to mental status] Code Status:   Code Status: Full Code Family Communication: Daughter via telephone Disposition Plan: Likely to skilled nursing facility pending improvement of mental status   Consultants:  Orthopedic surgery Neurology  Procedures:  Right hip hemiarthroplasty  Antimicrobials: Vancomycin  Cefazolin    Subjective: Patient is unable to provide a history secondary to mental status.  Objective: BP (!) 153/81 (BP Location: Left Arm)   Pulse 75   Temp 98.4 F (36.9 C) (Axillary)   Resp (!) 23   Ht 6' 1 (1.854 m)   Wt 68.8 kg   SpO2 100%   BMI 20.01 kg/m   Examination:  General exam: Appears calm and comfortable. Respiratory system: Clear to auscultation. Respiratory effort normal. Cardiovascular system: S1 & S2 heard, RRR. No murmur. Gastrointestinal system: Abdomen is nondistended, soft and nontender. Normal bowel sounds heard. Central nervous system: Asleep but opens his eyes. Does not follow commands. Right-sided upper extremity tremor. Musculoskeletal: No edema. No calf tenderness Psychiatry: Judgement and insight appear normal. Mood & affect appropriate.    Data Reviewed: I have personally reviewed following labs and imaging studies  CBC Lab Results  Component Value Date   WBC 7.4 01/16/2024   RBC 3.27 (L) 01/16/2024   HGB 8.3 (L) 01/16/2024  HCT 25.2 (L)  01/16/2024   MCV 77.1 (L) 01/16/2024   MCH 25.4 (L) 01/16/2024   PLT 277 01/16/2024   MCHC 32.9 01/16/2024   RDW 16.7 (H) 01/16/2024   LYMPHSABS 1.4 12/07/2023   MONOABS 0.3 12/07/2023   EOSABS 0.1 12/07/2023   BASOSABS 0.0 12/07/2023     Last metabolic panel Lab Results  Component Value Date   NA 138 01/16/2024   K 3.7 01/16/2024   CL 102 01/16/2024   CO2 21 (L) 01/16/2024   BUN 29 (H) 01/16/2024   CREATININE 2.57 (H) 01/16/2024   GLUCOSE 83 01/16/2024   GFRNONAA 24 (L) 01/16/2024   GFRAA 46 (L) 06/08/2018   CALCIUM  8.0 (L) 01/16/2024   PHOS 3.2 03/27/2017   PROT 6.8 01/15/2024   ALBUMIN  2.4 (L) 01/15/2024   LABGLOB 4.2 (H) 11/06/2023   BILITOT 0.5 01/15/2024   ALKPHOS 90 01/15/2024   AST 88 (H) 01/15/2024   ALT 69 (H) 01/15/2024   ANIONGAP 15 01/16/2024    GFR: Estimated Creatinine Clearance: 20.1 mL/min (A) (by C-G formula based on SCr of 2.57 mg/dL (H)).  Recent Results (from the past 240 hours)  Surgical pcr screen     Status: None   Collection Time: 01/14/24  4:47 AM   Specimen: Nasal Mucosa; Nasal Swab  Result Value Ref Range Status   MRSA, PCR NEGATIVE NEGATIVE Final   Staphylococcus aureus NEGATIVE NEGATIVE Final    Comment: (NOTE) The Xpert SA Assay (FDA approved for NASAL specimens in patients 37 years of age and older), is one component of a comprehensive surveillance program. It is not intended to diagnose infection nor to guide or monitor treatment. Performed at [facility], [address] [place], [place] [address]       Radiology Studies: EEG adult Result Date: 01/15/2024 Shelton Arlin KIDD, MD     01/15/2024  5:56 PM Patient Name: Juan Goodwin MRN: [id number] Epilepsy Attending: Arlin KIDD Shelton Referring Physician/Provider: Khaliqdina, Salman, MD Date: 01/15/2024 Duration: 26.18 mins Patient history: 88yo M with ams. EEG to evaluate for seizure Level of alertness: Awake AEDs during EEG study: None Technical aspects: This EEG study  was done with scalp electrodes positioned according to the 10-20 International system of electrode placement. Electrical activity was reviewed with band pass filter of 1-70Hz , sensitivity of 7 uV/mm, display speed of 80mm/sec with a 60Hz  notched filter applied as appropriate. EEG data were recorded continuously and digitally stored.  Video monitoring was available and reviewed as appropriate. Description: The posterior dominant rhythm consists of 8-9 Hz activity of moderate voltage (25-35 uV) seen predominantly in posterior head regions, symmetric and reactive to eye opening and eye closing. EEG showed intermittent generalized 3 to 6 Hz theta-delta slowing admixed with 13-15 Hz beta activity distributed symmetrically and diffusely. Hyperventilation and photic stimulation were not performed.   ABNORMALITY - Intermittent slow, generalized IMPRESSION: This study is suggestive of generalized cerebral dysfunction (encephalopathy). No seizures or epileptiform discharges were seen throughout the recording. Arlin KIDD Shelton   DG Pelvis 1-2 Views Result Date: 01/15/2024 CLINICAL DATA:  Right hip prosthesis placement for a subcapital right femoral neck fracture. EXAM: PELVIS - 1-2 VIEW COMPARISON:  01/13/2024 FINDINGS: Interval right hip bipolar prosthesis in satisfactory position and alignment. No interval fracture or dislocation. Diffuse osteopenia. Stable vascular stents and coils. IMPRESSION: Satisfactory postoperative appearance of a right hip bipolar prosthesis. Electronically Signed   By: Elspeth Bathe M.D.   On: 01/15/2024 16:14   MR BRAIN  WO CONTRAST Result Date: 01/15/2024 EXAM: MRI BRAIN WITHOUT CONTRAST 01/15/2024 11:06:00 AM TECHNIQUE: Multiplanar multisequence MRI of the head/brain was performed without the administration of intravenous contrast. COMPARISON: MRI head June 08, 2018 CLINICAL HISTORY: Neuro deficit, acute, stroke suspected FINDINGS: BRAIN AND VENTRICLES: No acute infarct. No acute  intracranial hemorrhage. Subtle chronic superficial siderosis along the left sylvian fissure. No mass. No midline shift. No hydrocephalus. Small remote lacunar infarcts in the basal ganglia and mild for age T2/FLAIR hyperintensities in the white matter, compatible with chronic microvascular ischemic change. Normal flow voids. ORBITS: No acute abnormality. SINUSES AND MASTOIDS: No acute abnormality. BONES AND SOFT TISSUES: Normal marrow signal. IMPRESSION: 1. No acute intracranial abnormality. 2. Similar mild for age chronic microvascular ischemic change. 3. Subtle chronic superficial siderosis along the left sylvian fissure. Electronically signed by: Gilmore Molt MD 01/15/2024 12:08 PM EST RP Workstation: [id number]   CT ANGIO HEAD NECK W WO CM (CODE STROKE) Result Date: 01/15/2024 EXAM: CTA HEAD AND NECK WITH AND WITHOUT 01/14/2024 11:11:11 PM TECHNIQUE: CTA of the head and neck was performed with and without the administration of 75 mL of intravenous iohexol  (OMNIPAQUE ) 350 MG/ML injection. Multiplanar 2D and/or 3D reformatted images are provided for review. Automated exposure control, iterative reconstruction, and/or weight based adjustment of the mA/kV was utilized to reduce the radiation dose to as low as reasonably achievable. Stenosis of the internal carotid arteries measured using NASCET criteria. COMPARISON: None available CLINICAL HISTORY: Neuro deficit, acute, stroke suspected. FINDINGS: CTA NECK: AORTIC ARCH AND ARCH VESSELS: Standard aortic arch branching pattern with predominantly soft plaque and scattered small areas of plaque ulceration. No dissection or arterial injury. No significant stenosis of the brachiocephalic or subclavian arteries. CERVICAL CAROTID ARTERIES: No dissection, arterial injury, or hemodynamically significant stenosis by NASCET criteria. CERVICAL VERTEBRAL ARTERIES: No dissection, arterial injury, or significant stenosis. LUNGS AND MEDIASTINUM: Unremarkable. SOFT TISSUES:  1.4 cm thyroid  nodule for which no follow up imaging is recommended. BONES: Moderately advanced cervical disc degeneration and asymmetric right sided facet arthrosis. CTA HEAD: ANTERIOR CIRCULATION: The intracranial internal carotid arteries are patent with diffuse atherosclerotic irregularity but no significant stenosis. ACAs and MCAs are patent with widespread atherosclerotic irregularity but no evidence of a proximal branch occlusion. There are mild right and moderate left M1 stenoses and moderate bilateral M2 stenoses. There is also a moderate distal right A2 stenosis. No aneurysm. POSTERIOR CIRCULATION: The intracranial vertebral arteries are patent to the basilar with mild atherosclerotic irregularity bilaterally as well as a mild stenosis of the distal right V4 segment at the vertebrobasilar junction. The basilar artery is patent with diffuse atherosclerotic irregularity and a moderate stenosis proximally. There are small posterior communicating arteries bilaterally. The PCAs are patent with diffuse atherosclerotic irregularity including moderate narrowing of the right P1 and proximal P2 segments as well as a severe distal left P2 stenosis. No aneurysm. OTHER: No dural venous sinus thrombosis on this non-dedicated study. IMPRESSION: 1. No large vessel occlusion. 2. Widespread intracranial atherosclerosis with multiple predominantly moderate anterior and posterior circulation stenoses as above. 3. Widely patent cervical carotid and vertebral arteries. Electronically signed by: Dasie Hamburg MD 01/15/2024 08:26 AM EST RP Workstation: [id number]   CT HEAD CODE STROKE WO CONTRAST Result Date: 01/14/2024 EXAM: CT HEAD WITHOUT CONTRAST 01/14/2024 11:04:44 PM TECHNIQUE: CT of the head was performed without the administration of intravenous contrast. Automated exposure control, iterative reconstruction, and/or weight based adjustment of the mA/kV was utilized to reduce the radiation dose to as low as reasonably  achievable. COMPARISON: CT head 12/29/2017 CLINICAL HISTORY: Neuro deficit, acute, stroke suspected. FINDINGS: BRAIN AND VENTRICLES: No acute hemorrhage. No evidence of acute infarct. Small remote left caudate and thalamic lacunar infarcts. No hydrocephalus. No extra-axial collection. No mass effect or midline shift. ORBITS: No acute abnormality. SINUSES: No acute abnormality. SOFT TISSUES AND SKULL: No skull fracture. Findings conveyed to Dr. Vanessa via pager at 11:15 PM. IMPRESSION: 1. No acute intracranial abnormality. 2. Small remote left caudate and thalamic lacunar infarcts. Electronically signed by: Gilmore Molt MD 01/14/2024 11:15 PM EST RP Workstation: [id number]      LOS: 3 days    Elgin Lam, MD [facility] 01/16/2024, 7:26 AM   If 7PM-7AM, please contact night-coverage [email or web address]

## 2024-01-17 DIAGNOSIS — G928 Other toxic encephalopathy: Secondary | ICD-10-CM | POA: Diagnosis not present

## 2024-01-17 DIAGNOSIS — R1310 Dysphagia, unspecified: Secondary | ICD-10-CM | POA: Diagnosis not present

## 2024-01-17 DIAGNOSIS — Z7982 Long term (current) use of aspirin: Secondary | ICD-10-CM | POA: Diagnosis not present

## 2024-01-17 DIAGNOSIS — N289 Disorder of kidney and ureter, unspecified: Secondary | ICD-10-CM

## 2024-01-17 DIAGNOSIS — I69391 Dysphagia following cerebral infarction: Secondary | ICD-10-CM | POA: Diagnosis not present

## 2024-01-17 DIAGNOSIS — E43 Unspecified severe protein-calorie malnutrition: Secondary | ICD-10-CM | POA: Diagnosis not present

## 2024-01-17 DIAGNOSIS — S72001A Fracture of unspecified part of neck of right femur, initial encounter for closed fracture: Secondary | ICD-10-CM | POA: Diagnosis not present

## 2024-01-17 LAB — BASIC METABOLIC PANEL WITH GFR
Anion gap: 12 (ref 5–15)
Anion gap: 9 (ref 5–15)
BUN: 36 mg/dL — ABNORMAL HIGH (ref 8–23)
BUN: 37 mg/dL — ABNORMAL HIGH (ref 8–23)
CO2: 23 mmol/L (ref 22–32)
CO2: 24 mmol/L (ref 22–32)
Calcium: 7.9 mg/dL — ABNORMAL LOW (ref 8.9–10.3)
Calcium: 8.2 mg/dL — ABNORMAL LOW (ref 8.9–10.3)
Chloride: 103 mmol/L (ref 98–111)
Chloride: 106 mmol/L (ref 98–111)
Creatinine, Ser: 2.21 mg/dL — ABNORMAL HIGH (ref 0.61–1.24)
Creatinine, Ser: 2.22 mg/dL — ABNORMAL HIGH (ref 0.61–1.24)
GFR, Estimated: 28 mL/min — ABNORMAL LOW (ref >60)
GFR, Estimated: 28 mL/min — ABNORMAL LOW (ref >60)
Glucose, Bld: 159 mg/dL — ABNORMAL HIGH (ref 70–99)
Glucose, Bld: 252 mg/dL — ABNORMAL HIGH (ref 70–99)
Potassium: 3.7 mmol/L (ref 3.5–5.1)
Potassium: 3.5 mmol/L (ref 3.5–5.1)
Sodium: 138 mmol/L (ref 135–145)
Sodium: 139 mmol/L (ref 135–145)

## 2024-01-17 LAB — IRON AND TIBC
Iron: 26 ug/dL — ABNORMAL LOW (ref 45–182)
Saturation Ratios: 20 % (ref 17.9–39.5)
TIBC: 132 ug/dL — ABNORMAL LOW (ref 250–450)
UIBC: 106 ug/dL

## 2024-01-17 LAB — CBC
HCT: 22.2 % — ABNORMAL LOW (ref 39.0–52.0)
Hemoglobin: 7.3 g/dL — ABNORMAL LOW (ref 13.0–17.0)
MCH: 25.4 pg — ABNORMAL LOW (ref 26.0–34.0)
MCHC: 32.9 g/dL (ref 30.0–36.0)
MCV: 77.4 fL — ABNORMAL LOW (ref 80.0–100.0)
Platelets: 276 K/uL (ref 150–400)
RBC: 2.87 MIL/uL — ABNORMAL LOW (ref 4.22–5.81)
RDW: 16.8 % — ABNORMAL HIGH (ref 11.5–15.5)
WBC: 6.2 K/uL (ref 4.0–10.5)
nRBC: 0 % (ref 0.0–0.2)

## 2024-01-17 LAB — RETICULOCYTES
Immature Retic Fract: 24.2 % — ABNORMAL HIGH (ref 2.3–15.9)
RBC.: 2.96 MIL/uL — ABNORMAL LOW (ref 4.22–5.81)
Retic Count, Absolute: 57.1 K/uL (ref 19.0–186.0)
Retic Ct Pct: 1.9 % (ref 0.4–3.1)

## 2024-01-17 LAB — GLUCOSE, CAPILLARY
Glucose-Capillary: 177 mg/dL — ABNORMAL HIGH (ref 70–99)
Glucose-Capillary: 218 mg/dL — ABNORMAL HIGH (ref 70–99)
Glucose-Capillary: 233 mg/dL — ABNORMAL HIGH (ref 70–99)
Glucose-Capillary: 243 mg/dL — ABNORMAL HIGH (ref 70–99)
Glucose-Capillary: 237 mg/dL — ABNORMAL HIGH (ref 70–99)

## 2024-01-17 LAB — HEPATIC FUNCTION PANEL
ALT: 10 U/L (ref 0–44)
AST: 50 U/L — ABNORMAL HIGH (ref 15–41)
Albumin: 2 g/dL — ABNORMAL LOW (ref 3.5–5.0)
Alkaline Phosphatase: 81 U/L (ref 38–126)
Bilirubin, Direct: 0.1 mg/dL (ref 0.0–0.2)
Indirect Bilirubin: 0.5 mg/dL (ref 0.3–0.9)
Total Bilirubin: 0.6 mg/dL (ref 0.0–1.2)
Total Protein: 6 g/dL — ABNORMAL LOW (ref 6.5–8.1)

## 2024-01-17 LAB — PHOSPHORUS: Phosphorus: 3.8 mg/dL (ref 2.5–4.6)

## 2024-01-17 LAB — MAGNESIUM: Magnesium: 2.3 mg/dL (ref 1.7–2.4)

## 2024-01-17 LAB — FERRITIN: Ferritin: 597 ng/mL — ABNORMAL HIGH (ref 24–336)

## 2024-01-17 LAB — LACTATE DEHYDROGENASE: LDH: 191 U/L (ref 98–192)

## 2024-01-17 MED ORDER — INSULIN ASPART 100 UNIT/ML IJ SOLN
0.0000 [IU] | INTRAMUSCULAR | Status: DC
  Administered 2024-01-17 (×2): 3 [IU] via SUBCUTANEOUS
  Administered 2024-01-18: 1 [IU] via SUBCUTANEOUS
  Administered 2024-01-18: 5 [IU] via SUBCUTANEOUS
  Administered 2024-01-18: 3 [IU] via SUBCUTANEOUS
  Administered 2024-01-18: 5 [IU] via SUBCUTANEOUS
  Administered 2024-01-18 – 2024-01-19 (×6): 3 [IU] via SUBCUTANEOUS
  Administered 2024-01-19 (×2): 2 [IU] via SUBCUTANEOUS
  Administered 2024-01-19: 3 [IU] via SUBCUTANEOUS
  Administered 2024-01-20: 1 [IU] via SUBCUTANEOUS
  Administered 2024-01-20: 2 [IU] via SUBCUTANEOUS
  Administered 2024-01-20: 3 [IU] via SUBCUTANEOUS
  Administered 2024-01-20 – 2024-01-23 (×11): 2 [IU] via SUBCUTANEOUS
  Administered 2024-01-23 (×2): 1 [IU] via SUBCUTANEOUS
  Administered 2024-01-24: 2 [IU] via SUBCUTANEOUS
  Administered 2024-01-24: 3 [IU] via SUBCUTANEOUS
  Administered 2024-01-24: 2 [IU] via SUBCUTANEOUS
  Administered 2024-01-24: 1 [IU] via SUBCUTANEOUS
  Administered 2024-01-25 (×2): 2 [IU] via SUBCUTANEOUS
  Administered 2024-01-25: 1 [IU] via SUBCUTANEOUS
  Administered 2024-01-26: 2 [IU] via SUBCUTANEOUS
  Administered 2024-01-26 – 2024-01-27 (×3): 1 [IU] via SUBCUTANEOUS
  Administered 2024-01-27: 2 [IU] via SUBCUTANEOUS
  Administered 2024-01-28: 1 [IU] via SUBCUTANEOUS
  Filled 2024-01-17: qty 3
  Filled 2024-01-17: qty 2
  Filled 2024-01-17: qty 5
  Filled 2024-01-17 (×3): qty 2
  Filled 2024-01-17 (×3): qty 3
  Filled 2024-01-17 (×5): qty 2
  Filled 2024-01-17: qty 1
  Filled 2024-01-17 (×2): qty 3
  Filled 2024-01-17: qty 2
  Filled 2024-01-17 (×2): qty 3
  Filled 2024-01-17: qty 2
  Filled 2024-01-17: qty 1
  Filled 2024-01-17 (×2): qty 3
  Filled 2024-01-17 (×2): qty 2
  Filled 2024-01-17: qty 5
  Filled 2024-01-17: qty 2
  Filled 2024-01-17: qty 1
  Filled 2024-01-17: qty 2
  Filled 2024-01-17: qty 3
  Filled 2024-01-17: qty 1
  Filled 2024-01-17 (×3): qty 2
  Filled 2024-01-17: qty 1
  Filled 2024-01-17: qty 4
  Filled 2024-01-17 (×3): qty 1
  Filled 2024-01-17 (×2): qty 2

## 2024-01-17 MED ORDER — ASPIRIN 81 MG PO CHEW
81.0000 mg | CHEWABLE_TABLET | Freq: Two times a day (BID) | ORAL | Status: DC
  Administered 2024-01-17 – 2024-01-23 (×14): 81 mg
  Filled 2024-01-17 (×13): qty 1

## 2024-01-17 MED ORDER — ASPIRIN 300 MG RE SUPP
300.0000 mg | Freq: Two times a day (BID) | RECTAL | Status: DC
  Filled 2024-01-17 (×14): qty 1

## 2024-01-17 MED ORDER — THIAMINE MONONITRATE 100 MG PO TABS
100.0000 mg | ORAL_TABLET | Freq: Every day | ORAL | Status: DC
  Administered 2024-01-18: 100 mg via ORAL
  Filled 2024-01-17: qty 1

## 2024-01-17 MED ORDER — ENSURE PLUS HIGH PROTEIN PO LIQD
237.0000 mL | Freq: Two times a day (BID) | ORAL | Status: DC
  Administered 2024-01-17 – 2024-01-22 (×9): 237 mL via ORAL

## 2024-01-17 MED ORDER — VITAMIN D 25 MCG (1000 UNIT) PO TABS
1000.0000 [IU] | ORAL_TABLET | Freq: Every day | ORAL | Status: DC
  Administered 2024-01-18 – 2024-01-28 (×11): 1000 [IU] via ORAL
  Filled 2024-01-17 (×11): qty 1

## 2024-01-17 MED ORDER — ADULT MULTIVITAMIN W/MINERALS CH
1.0000 | ORAL_TABLET | Freq: Every day | ORAL | Status: DC
  Administered 2024-01-18 – 2024-01-28 (×11): 1 via ORAL
  Filled 2024-01-17 (×11): qty 1

## 2024-01-17 MED ORDER — ROSUVASTATIN CALCIUM 20 MG PO TABS
40.0000 mg | ORAL_TABLET | Freq: Every day | ORAL | Status: DC
  Administered 2024-01-17 – 2024-01-20 (×4): 40 mg
  Filled 2024-01-17 (×3): qty 2

## 2024-01-17 NOTE — Progress Notes (Addendum)
   01/17/24 1556  Charting Type  Charting Type Full Reassessment No Change  Focused Reassessment No Changes Neurological;Respiratory;Cardiac  Focused Reassessment Changes Noted Vascular   Work Intensity Score (Update with each assessment and as needed)  Work Intensity Score (Level) 3  Level 3 Intensity B.Frequent assistance requests (patient or family)  Neurological  Neuro (WDL) X  Orientation Level Oriented to person;Oriented to place;Disoriented to time;Disoriented to situation  Cognition Follows commands  Speech Delayed responses  NIH Stroke Scale   Dizziness Present No  Headache Present No  Interval Other (Comment)  Level of Consciousness (1a.)    0  LOC Questions (1b. )    1  LOC Commands (1c. )    0  Best Gaze (2. )   0  Visual (3. )   0  Facial Palsy (4. )     0  Motor Arm, Left (5a. )    0  Motor Arm, Right (5b. )  1  Motor Leg, Left (6a. )   UN  Motor Leg, Right (6b. )  UN  Limb Ataxia (7. ) 0  Sensory (8. )   0  Best Language (9. )   1  Dysarthria (10. ) 1  Extinction/Inattention (11.)    0  Complete NIHSS TOTAL 4  Respiratory  Respiratory Pattern Regular;Unlabored  Chest Assessment Chest expansion symmetrical  Respiratory (WDL) WDL  Cardiac  Cardiac (WDL) X  Pulse Regular  Second Vascular Site Assessment  #2 - Location of Site Assessment Left femoral  #2 - Vascular Site Assessment Scale Level 1 (marked, Nettey made aware)  Neurological  Level of Consciousness Alert   RN and NT bathe patient, RN noticed left groin hematoma level 1. RN marked and MD made aware.

## 2024-01-17 NOTE — Progress Notes (Addendum)
 Nutrition Follow-up  DOCUMENTATION CODES:   Severe malnutrition in context of chronic illness  INTERVENTION:  Continue tube feeding via Cortrak: Osmolite 1.5  at 20 ml/h and increase by 10 ml every 8 hours until goal of 55 ml/hr (1320 ml per day) Prosource TF20 60 ml daily 100 ml FWF Q4H   Provides 2060 kcal, 102 gm protein, 1005 ml free water daily (1605 ml water daily TF + FWF) *Recommend continuing tube feeds until pt consistently eating >50% of his meals or meeting >60% of his nutritional needs by po.   Encourage PO intake: Nursing to assist with meals Automatic trays until mentation improves Went over preordering melas with daughter at bedside Ensure Plus High Protein po BID, each supplement provides 350 kcal and 20 grams of protein Magic cup TID with meals, each supplement provides 290 kcal and 9 grams of protein 100 mg Thiamine x 7 po MVI with minerals daily po 1,000 units vitamin D  daily po Monitor magnesium , potassium, and phosphorus daily for at least 3 days, MD to replete as needed, as pt is at risk for refeeding syndrome May need PEG if pt with prolonged poor po intake/AMS if within GOC Recommend adding Q4H insulin coverage for tube feeds   NUTRITION DIAGNOSIS:   Severe Malnutrition related to chronic illness as evidenced by severe muscle depletion, severe fat depletion. - Ongoing   GOAL:   Patient will meet greater than or equal to 90% of their needs - Progressing   MONITOR:   PO intake, Supplement acceptance, Diet advancement, Labs, Skin  REASON FOR ASSESSMENT:   Consult Assessment of nutrition requirement/status, Hip fracture protocol  ASSESSMENT:   88 y.o. male with PMH of AAA, CAD, CKD 3a, duodenal ulcer/history of GI bleed, CKD stage IIIa, CAD, HLD, HTN,  MI. Admitted after ground level fall and found to have right femoral neck fracture s/p rigth hemiarthoplasty. Post op complicated from AMS, transferred to progressive unit for further evaluation.    11/2 - Imaging of right hip and pelvis showed displaced subcapital femoral neck fracture  11/3 - s/p Right Hemiarthroplasty  11/4 - Change of mental status, code stroke CT negative for acute abnormality, Transferred to progressive, MRI negative, SLP eval->NPO 11/6 - Dysphagia 3, thin liquids, tube feeds at 40 ml/hr   Pt resting in bed, did awaken to voice but then fell back asleep. Daughter at bedside reports he had about 25% of his lunch. At baseline pt only had 2 meals per day breakfast and dinner with an Ensure in between.  Daughter lives with pt and prepares his meals for him. Pt was working with physical therapy at home and was able to walk with a cane PTA. Pt was losing weight earlier this year and then she started giving pt Ensures 1-2 times per day and pt had stated to regain weight. Daughter did state she did not know why pt was losing weight however, after going over dietary recall with her, pt was not eating enough. Pt also had trouble with fluid intake at home as daughter reports ongoing kidney issues.   Tube feeds running at 40 ml/hr, not yet at goal of 55 ml/hr. Pt tolerating. Recommend continuing tube feeds until goal is reached and then can consider nocturnal tube feeds to help stimulate appetite. Recommend continuing tube feeds until pt consistently eating >50% of his meals or meeting >60% of his nutritional needs by po. Only had 25% of his lunch today. Pt is severely malnourished, add ONS, monitor po intake. May  need PEG if pt cannot adequately take in enough by po as it seems he was doing poorly PTA.  No documented BM since 10/31, reached out to RN to give PRN Miralax.   Dietary Recall: Breakfast: toast, fruit, eggs Lunch: Ensure Dinner: Protein, vegetable, starch    Admit weight: 66.8 kg Current weight: 69.4 kg   Average Meal Intake: 11/6: 25% intake x 1 recorded meals  Nutritionally Relevant Medications: Scheduled Meds:  aspirin   81 mg Per Tube BID   Or   aspirin    300 mg Rectal BID   cholecalciferol  1,000 Units Per Tube Daily   feeding supplement  237 mL Oral BID BM   feeding supplement (PROSource TF20)  60 mL Per Tube Daily   free water  100 mL Per Tube Q4H   Influenza vac split trivalent PF  0.5 mL Intramuscular Tomorrow-1000   isosorbide  mononitrate  30 mg Oral Daily   metoprolol succinate  25 mg Oral Daily   multivitamin with minerals  1 tablet Per Tube Daily   pneumococcal 20-valent conjugate vaccine  0.5 mL Intramuscular Tomorrow-1000   rosuvastatin   40 mg Per Tube Daily   thiamine  100 mg Per Tube Daily   Continuous Infusions:  feeding supplement (OSMOLITE 1.5 CAL) 40 mL/hr at 01/17/24 0800    Labs Reviewed: BUN 37 Creatinine 2.22 AST 50 GFR 28 CBG ranges from 75-233 mg/dL over the last 24 hours HgbA1c 6.0  Diet Order:   Diet Order             DIET DYS 3 Room service appropriate? No; Fluid consistency: Thin  Diet effective now                   EDUCATION NEEDS:   Not appropriate for education at this time  Skin:  Skin Assessment: Skin Integrity Issues: Skin Integrity Issues:: Incisions Incisions: Closed surgical incision left groin  Last BM:  10/31  Height:   Ht Readings from Last 1 Encounters:  01/14/24 6' 1 (1.854 m)    Weight:   Wt Readings from Last 1 Encounters:  01/17/24 69.4 kg    Ideal Body Weight:  83.6 kg  BMI:  Body mass index is 20.19 kg/m.  Estimated Nutritional Needs:   Kcal:  1800-2000 kcal  Protein:  90-110 gm  Fluid:  >1.8L/day   Olivia Kenning, RD Registered Dietitian  See Amion for more information

## 2024-01-17 NOTE — Progress Notes (Signed)
 PROGRESS NOTE    [name]  FMW:[id number] DOB: 1935-07-23 DOA: 01/13/2024 PCP: [name], MD   Brief Narrative: Juan Goodwin is a 88 y.o. male with a history of AAA, CAD, CKD stage IIIa, subdural hematoma, duodenal ulcer, CAD, hyperlipidemia, primary pretension.  Patient presented secondary to a fall and resultant right femoral neck fracture.  Orthopedic surgery was consulted and performed a right hip hemiarthroplasty on 11/3.  Hospitalization complicated by resultant metabolic encephalopathy of unknown etiology.   Assessment and Plan:  Right femoral neck fracture Secondary to fall. Orthopedic surgery consulted and performed a right hemiarthroplasty on 11/3.  Orthopedic surgery recommendation for weightbearing as tolerated regarding right lower extremity with posterior hip precautions in addition to aspirin  81 mg twice daily for DVT prophylaxis.  Acute metabolic encephalopathy Presumed diagnosis, although unclear etiology. Symptoms occurred post-surgery. CT head and MRI brain without evidence of acute intracranial process. EEG obtained and was negative for evidence of seizure activity. Some worsening renal function with BUN up to 29. TSH, Vitamin B12, folate, ammonia level do not explain symptoms. Mentation is improved today but not at baseline.  AKI on CKD stage IIIa Baseline creatinine of about 1.3-1.4. This admission, creatinine up to a peak of 2.57. Worsened with initial IV fluids. Urine output quantity not documented. Blood pressure appears to have been adequate during surgery without evidence of significant hypotension. Renal ultrasound significant for evidence of medical renal disease and bladder suggestive of history of retention. Creatinine has trended down today -Strict in/out -Recheck BMP  Dysphagia Secondary to mental status change.  Patient is seen and evaluated by speech therapy who recommends n.p.o. status.  Discussed with family and decision made to have  Cortrak feeding tube placed temporarily. -Cortrak NG tube -Dietitian consultation -SLP recommendations: pending today  Hyponatremia Mild. Resolved.  Elevated LFTs Mild. Unclear etiology but improving.  History of CAD Noted.  Patient with prior NSTEMI.  Patient is on metoprolol and Crestor  as an outpatient.  Currently on aspirin  for DVT prophylaxis per orthopedic surgery.  Chronic anemia Likely anemia of chronic disease secondary to known CKD. Baseline hemoglobin of around 9-10. Continues to drift down post-operatively. -Trend CBC -Check LDH, haptoglobin, FOBT, iron, TIBC, ferritin, reticulocyte count -Type and screen  Primary hypertension Uncontrolled on admission.  History of abdominal aortic aneurysm Patient is status post recent infrarenal endovascular aortic repair with coil of the left internal iliac artery    DVT prophylaxis: Per orthopedic surgery: aspirin  81 mg BID [on suppositories secondary to mental status] Code Status:   Code Status: Full Code Family Communication: Daughter via telephone Disposition Plan: Likely to skilled nursing facility pending improvement of mental status, stable hemoglobin, and improved oral intake   Consultants:  Orthopedic surgery Neurology  Procedures:  Right hip hemiarthroplasty  Antimicrobials: Vancomycin  Cefazolin    Subjective: Patient states he does not feel well but can't explain why specifically.  Objective: BP 114/65   Pulse 68   Temp 98.3 F (36.8 C) (Oral)   Resp 18   Ht 6' 1 (1.854 m)   Wt 69.4 kg   SpO2 100%   BMI 20.19 kg/m   Examination:  General exam: Appears calm and comfortable. Respiratory system: Clear to auscultation. Respiratory effort normal. Cardiovascular system: S1 & S2 heard, RRR. Gastrointestinal system: Abdomen is nondistended, soft and nontender. Normal bowel sounds heard. Central nervous system: Alert and oriented to person and place. Musculoskeletal: No edema. No calf  tenderness   Data Reviewed: I have personally reviewed following  labs and imaging studies  CBC Lab Results  Component Value Date   WBC 6.2 01/17/2024   RBC 2.87 (L) 01/17/2024   HGB 7.3 (L) 01/17/2024   HCT 22.2 (L) 01/17/2024   MCV 77.4 (L) 01/17/2024   MCH 25.4 (L) 01/17/2024   PLT 276 01/17/2024   MCHC 32.9 01/17/2024   RDW 16.8 (H) 01/17/2024   LYMPHSABS 1.4 12/07/2023   MONOABS 0.3 12/07/2023   EOSABS 0.1 12/07/2023   BASOSABS 0.0 12/07/2023     Last metabolic panel Lab Results  Component Value Date   NA 138 01/17/2024   K 3.7 01/17/2024   CL 103 01/17/2024   CO2 23 01/17/2024   BUN 36 (H) 01/17/2024   CREATININE 2.21 (H) 01/17/2024   GLUCOSE 159 (H) 01/17/2024   GFRNONAA 28 (L) 01/17/2024   GFRAA 46 (L) 06/08/2018   CALCIUM  7.9 (L) 01/17/2024   PHOS 3.8 01/17/2024   PROT 6.8 01/15/2024   ALBUMIN  2.4 (L) 01/15/2024   LABGLOB 4.2 (H) 11/06/2023   BILITOT 0.5 01/15/2024   ALKPHOS 90 01/15/2024   AST 88 (H) 01/15/2024   ALT 69 (H) 01/15/2024   ANIONGAP 12 01/17/2024    GFR: Estimated Creatinine Clearance: 23.6 mL/min (A) (by C-G formula based on SCr of 2.21 mg/dL (H)).  Recent Results (from the past 240 hours)  Surgical pcr screen     Status: None   Collection Time: 01/14/24  4:47 AM   Specimen: Nasal Mucosa; Nasal Swab  Result Value Ref Range Status   MRSA, PCR NEGATIVE NEGATIVE Final   Staphylococcus aureus NEGATIVE NEGATIVE Final    Comment: (NOTE) The Xpert SA Assay (FDA approved for NASAL specimens in patients 23 years of age and older), is one component of a comprehensive surveillance program. It is not intended to diagnose infection nor to guide or monitor treatment. Performed at [facility], [address], [place], [place] [address]       Radiology Studies: DG CHEST PORT 1 VIEW Result Date: 01/16/2024 CLINICAL DATA:  Tachypnea EXAM: PORTABLE CHEST 1 VIEW COMPARISON:  10/11/2020 FINDINGS: Single frontal view of the chest  demonstrates enteric catheter passing below diaphragm, tip projecting over the gastric body. Cardiac silhouette is unremarkable. No acute airspace disease, effusion, or pneumothorax. No acute bony abnormalities. IMPRESSION: 1. No acute intrathoracic process. Electronically Signed   By: Ozell Daring M.D.   On: 01/16/2024 20:09   US  RENAL Result Date: 01/16/2024 CLINICAL DATA:  Acute kidney injury. EXAM: RENAL / URINARY TRACT ULTRASOUND COMPLETE COMPARISON:  September 23, 2004 FINDINGS: Right Kidney: Renal measurements: 7.9 cm x 4.3 cm x 4.8 cm = volume: 85.3 mL. There is increased echogenicity of the renal parenchyma. No mass or hydronephrosis visualized. Left Kidney: Renal measurements: 8.9 cm x 4.8 cm x 4.2 cm = volume: 94.5 mL. There is increased echogenicity of the renal parenchyma. 3.2 cm x 2.6 cm x 2.5 cm and 0.7 cm x 0.8 cm x 0.7 cm simple cysts are seen within the upper pole of the left kidney. No hydronephrosis is visualized. Bladder: The urinary bladder wall is trabeculated in appearance. Other: The prostate gland measures 2.9 cm x 2.9 cm x 4.6 cm. The study is technically difficult secondary to limited patient positioning, as per the ultrasound technologist. IMPRESSION: 1. Bilateral echogenic kidneys which may represent sequelae associated with medical renal disease. 2. Simple left renal cysts. 3. Urinary bladder trabeculation which may represent sequelae associated with chronic urinary bladder outlet obstruction. Electronically Signed  By: Suzen Dials M.D.   On: 01/16/2024 19:37   EEG adult Result Date: 01/15/2024 Shelton Arlin KIDD, MD     01/15/2024  5:56 PM Patient Name: KASHON KRAYNAK MRN: [id number] Epilepsy Attending: Arlin KIDD Shelton Referring Physician/Provider: Khaliqdina, Salman, MD Date: 01/15/2024 Duration: 26.18 mins Patient history: 88yo M with ams. EEG to evaluate for seizure Level of alertness: Awake AEDs during EEG study: None Technical aspects: This EEG study was done with scalp  electrodes positioned according to the 10-20 International system of electrode placement. Electrical activity was reviewed with band pass filter of 1-70Hz , sensitivity of 7 uV/mm, display speed of 75mm/sec with a 60Hz  notched filter applied as appropriate. EEG data were recorded continuously and digitally stored.  Video monitoring was available and reviewed as appropriate. Description: The posterior dominant rhythm consists of 8-9 Hz activity of moderate voltage (25-35 uV) seen predominantly in posterior head regions, symmetric and reactive to eye opening and eye closing. EEG showed intermittent generalized 3 to 6 Hz theta-delta slowing admixed with 13-15 Hz beta activity distributed symmetrically and diffusely. Hyperventilation and photic stimulation were not performed.   ABNORMALITY - Intermittent slow, generalized IMPRESSION: This study is suggestive of generalized cerebral dysfunction (encephalopathy). No seizures or epileptiform discharges were seen throughout the recording. Arlin KIDD Shelton   MR BRAIN WO CONTRAST Result Date: 01/15/2024 EXAM: MRI BRAIN WITHOUT CONTRAST 01/15/2024 11:06:00 AM TECHNIQUE: Multiplanar multisequence MRI of the head/brain was performed without the administration of intravenous contrast. COMPARISON: MRI head June 08, 2018 CLINICAL HISTORY: Neuro deficit, acute, stroke suspected FINDINGS: BRAIN AND VENTRICLES: No acute infarct. No acute intracranial hemorrhage. Subtle chronic superficial siderosis along the left sylvian fissure. No mass. No midline shift. No hydrocephalus. Small remote lacunar infarcts in the basal ganglia and mild for age T2/FLAIR hyperintensities in the white matter, compatible with chronic microvascular ischemic change. Normal flow voids. ORBITS: No acute abnormality. SINUSES AND MASTOIDS: No acute abnormality. BONES AND SOFT TISSUES: Normal marrow signal. IMPRESSION: 1. No acute intracranial abnormality. 2. Similar mild for age chronic microvascular ischemic  change. 3. Subtle chronic superficial siderosis along the left sylvian fissure. Electronically signed by: Gilmore Molt MD 01/15/2024 12:08 PM EST RP Workstation: [id number]      LOS: 4 days    Elgin Lam, MD [facility] 01/17/2024, 10:50 AM   If 7PM-7AM, please contact night-coverage [email or web address]

## 2024-01-17 NOTE — Inpatient Diabetes Management (Signed)
 Inpatient Diabetes Program Recommendations  AACE/ADA: New Consensus Statement on Inpatient Glycemic Control (2015)  Target Ranges:  Prepandial:   less than 140 mg/dL      Peak postprandial:   less than 180 mg/dL (1-2 hours)      Critically ill patients:  140 - 180 mg/dL   Lab Results  Component Value Date   GLUCAP 233 (H) 01/17/2024   HGBA1C 6.0 (H) 01/13/2024    Review of Glycemic Control  Diabetes history: Prediabetes Outpatient Diabetes medications: None Current orders for Inpatient glycemic control: Novolog 0-9 Q4H  HgbA1C - 6.0%  Inpatient Diabetes Program Recommendations:    If blood sugars > goal of 140-180 mg/dL, may need TF coverage - Novolog 2 units Q4H.   Will f/u in am.   Thank you. Shona Brandy, RD, LDN, CDCES Inpatient Diabetes Coordinator [phone]

## 2024-01-17 NOTE — TOC Progression Note (Addendum)
 Transition of Care [facility]) - Progression Note    Patient Details  Name: Juan Goodwin MRN: [id number] Date of Birth: 1935-10-01  Transition of Care [facility]) CM/SW Contact  Montie LOISE Louder, [place] Phone Number: 01/17/2024, 12:04 PM  Clinical Narrative:     CSW acknowledges consult for SNF placement. Patient currently with cortrak and mittens- CSW will send out referral once patient is closer to being medically stable for d/c.  Spoke with patient's daughter, Shelba- informed her of the recommendations for short term rehab at [facility]. She is agrees with recommendations and gave CSW permission to fax out clinicals once patient is stable.    TOC will continue to follow and assist with discharge planning.   Montie Louder, MSW, LCSW Clinical Social Worker                      Expected Discharge Plan and Services                                               Social Drivers of Health (SDOH) Interventions SDOH Screenings   Food Insecurity: No Food Insecurity (01/13/2024)  Housing: Low Risk  (01/13/2024)  Transportation Needs: No Transportation Needs (01/13/2024)  Utilities: Not At Risk (01/13/2024)  Depression (PHQ2-9): Low Risk  (12/07/2023)  Physical Activity: Inactive (11/08/2021)  Social Connections: Unknown (01/13/2024)  Stress: No Stress Concern Present (11/08/2021)  Tobacco Use: Medium Risk (01/14/2024)    Readmission Risk Interventions     No data to display

## 2024-01-17 NOTE — Progress Notes (Signed)
 Speech Language Pathology Treatment: Dysphagia;Cognitive-Linguistic  Patient Details Name: Juan Goodwin MRN: [id number] DOB: [birth year] Today's Date: 01/17/2024 Time: 8874-8856 SLP Time Calculation (min) (ACUTE ONLY): 18 min  Assessment / Plan / Recommendation Clinical Impression  Pt demonstrates easy arousal, follows commands answers simple biographical questions. Mitten removed pt able to participate in self feeding. No coughing or signs of dysphagia. Pt drank about 8 z of water from a straw, self fed half a container of applesauce and fed himself a graham cracker. Pt ok to start a mechanical soft diet with thin liquids. Cognition and language gradually improving. No tremors today but pt stiff with neck turned right. Will f/u for tolerance   HPI HPI: 88 yo male presents to ED 11/2 with fall x1 week ago, subsequent R hip pain and + head trauma. Pt sustained R femoral neck fx s/p hemiarthroplasty (WBAT, posterior hip precautions) 11/3. Code stroke called post-op, CTH and CTA negative for acute findings, MRI negative. Pt cognition relatively normal at baseline, slow processing reported.  PMH includes: aphasia, AAA  s/p recent infrarenal endovascular aortic repair with coil of the left internal iliac artery on 01/04/2024, CAD, CKD, hypertension, MI, SDH s/p craniotomy for evacuation 2019, duodenal ulcer. Previous swallow evals x2 in 2019 both without overt signs of aspiration, mild oral deficits noted.      SLP Plan  Continue with current plan of care          Recommendations  Diet recommendations: Dysphagia 3 (mechanical soft);Thin liquid Liquids provided via: Cup;Straw Medication Administration: Whole meds with liquid Supervision: Staff to assist with self feeding                              Continue with current plan of care     Shamekia Tippets, [name]  01/17/2024, 1:56 PM

## 2024-01-17 NOTE — Anesthesia Postprocedure Evaluation (Signed)
 Anesthesia Post Note  Patient: Juan Goodwin  Procedure(s) Performed: HEMIARTHROPLASTY (BIPOLAR) HIP, POSTERIOR APPROACH FOR FRACTURE (Right)     Patient location during evaluation: PACU Anesthesia Type: General Level of consciousness: confused and obtunded/minimal responses Pain management: pain level controlled Vital Signs Assessment: post-procedure vital signs reviewed and stable Respiratory status: spontaneous breathing, nonlabored ventilation, respiratory function stable and patient connected to nasal cannula oxygen Cardiovascular status: blood pressure returned to baseline and stable Postop Assessment: no apparent nausea or vomiting Comments: Do to PMH stoke, length of aphasia and confusion code stroke called. Appeared metabolic but could not rule out with formal eval. CT neg for acute process, q2hr neuro exams to follow   No notable events documented.  Last Vitals:  Vitals:   01/17/24 0800 01/17/24 0900  BP: 122/66 114/65  Pulse: 65 68  Resp: 17 18  Temp: 36.8 C   SpO2: 100% 100%    Last Pain:  Vitals:   01/17/24 0800  TempSrc: Oral  PainSc: 0-No pain                 Andi Mahaffy

## 2024-01-17 NOTE — Progress Notes (Addendum)
 STROKE TEAM PROGRESS NOTE    SIGNIFICANT HOSPITAL EVENTS Presented to ED 11/2 after falling Saturday AM.  Right femoral neck fx S/p hemiarthroplasty 11/3: Neurology consulted d/t post-op change in mental status CTH: small remote lacunar infarcts 11/4: MRI negative.   INTERIM HISTORY/SUBJECTIVE No family at the bedside.  Patient appears to be a little bit calmer today.  No new neurological events overnight.  Hematocrit trending down.  Serum creatinine remains high.  Patient is less tremulous today and follow simple commands but is in 4-point restraints   CBC    Component Value Date/Time   WBC 6.2 01/17/2024 0231   RBC 2.87 (L) 01/17/2024 0231   HGB 7.3 (L) 01/17/2024 0231   HGB 10.5 (L) 12/07/2023 1428   HCT 22.2 (L) 01/17/2024 0231   PLT 276 01/17/2024 0231   PLT 193 12/07/2023 1428   MCV 77.4 (L) 01/17/2024 0231   MCH 25.4 (L) 01/17/2024 0231   MCHC 32.9 01/17/2024 0231   RDW 16.8 (H) 01/17/2024 0231   LYMPHSABS 1.4 12/07/2023 1428   MONOABS 0.3 12/07/2023 1428   EOSABS 0.1 12/07/2023 1428   BASOSABS 0.0 12/07/2023 1428    BMET    Component Value Date/Time   NA 138 01/17/2024 0231   K 3.7 01/17/2024 0231   CL 103 01/17/2024 0231   CO2 23 01/17/2024 0231   GLUCOSE 159 (H) 01/17/2024 0231   BUN 36 (H) 01/17/2024 0231   CREATININE 2.21 (H) 01/17/2024 0231   CREATININE 1.43 (H) 11/06/2023 1359   CALCIUM  7.9 (L) 01/17/2024 0231   GFRNONAA 28 (L) 01/17/2024 0231   GFRNONAA 48 (L) 11/06/2023 1359    IMAGING past 24 hours DG CHEST PORT 1 VIEW Result Date: 01/16/2024 CLINICAL DATA:  Tachypnea EXAM: PORTABLE CHEST 1 VIEW COMPARISON:  10/11/2020 FINDINGS: Single frontal view of the chest demonstrates enteric catheter passing below diaphragm, tip projecting over the gastric body. Cardiac silhouette is unremarkable. No acute airspace disease, effusion, or pneumothorax. No acute bony abnormalities. IMPRESSION: 1. No acute intrathoracic process. Electronically Signed   By:  Ozell Daring M.D.   On: 01/16/2024 20:09   US  RENAL Result Date: 01/16/2024 CLINICAL DATA:  Acute kidney injury. EXAM: RENAL / URINARY TRACT ULTRASOUND COMPLETE COMPARISON:  September 23, 2004 FINDINGS: Right Kidney: Renal measurements: 7.9 cm x 4.3 cm x 4.8 cm = volume: 85.3 mL. There is increased echogenicity of the renal parenchyma. No mass or hydronephrosis visualized. Left Kidney: Renal measurements: 8.9 cm x 4.8 cm x 4.2 cm = volume: 94.5 mL. There is increased echogenicity of the renal parenchyma. 3.2 cm x 2.6 cm x 2.5 cm and 0.7 cm x 0.8 cm x 0.7 cm simple cysts are seen within the upper pole of the left kidney. No hydronephrosis is visualized. Bladder: The urinary bladder wall is trabeculated in appearance. Other: The prostate gland measures 2.9 cm x 2.9 cm x 4.6 cm. The study is technically difficult secondary to limited patient positioning, as per the ultrasound technologist. IMPRESSION: 1. Bilateral echogenic kidneys which may represent sequelae associated with medical renal disease. 2. Simple left renal cysts. 3. Urinary bladder trabeculation which may represent sequelae associated with chronic urinary bladder outlet obstruction. Electronically Signed   By: Suzen Dials M.D.   On: 01/16/2024 19:37    Vitals:   01/17/24 0451 01/17/24 0800 01/17/24 0900 01/17/24 1000  BP:  122/66 114/65 109/68  Pulse:  65 68 66  Resp:  17 18 15   Temp:  98.3 F (36.8 C)  TempSrc:  Oral    SpO2:  100% 100% 100%  Weight: 69.4 kg     Height:         PHYSICAL EXAM General:  Alert, well-nourished, well-developed elderly patient in no acute distress CV: Regular rate and rhythm on monitor Respiratory:  Regular, unlabored respirations on room air   NEURO:  Mental Status: AA. Oriented to place, name, age, month, year.  Intermittently following commands Speech/Language: expressive aphasia present. Naming, repetition, fluency, and comprehension intact.   Cranial Nerves:  II: PERRL. Visual fields  full.  III, IV, VI: EOMI. Eyelids elevate symmetrically.  V: Sensation is intact to light touch and symmetrical to face.  VII: Face is symmetrical resting and smiling VIII: hearing intact to voice. IX, X: Palate elevates symmetrically. Moderate dysarthria   KP:Dynloizm shrug 5/5. XII: tongue is midline without fasciculations. Motor:  RLE: decreased ROM and weakness due to pain post-op. LLE: decreased and weak due to pain  Tone: is normal and bulk is normal intermittent coarse action tremor in both upper extremities.   .  Tremor is absent at rest.    Sensation- Intact to light touch bilaterally. Extinction absent to light touch to DSS.   Coordination: FTN intact bilaterally, HKS: no ataxia in BLE.No drift.  Gait- deferred   Most Recent NIH: 6   ASSESSMENT/PLAN   [name] is a 88 y.o. male with hx of prior SDH in 2019 with global aphasia that seemed to have resolved, hx of GI bleed, MI, CKD, HTN, who presented after a fall and R femoral neck displaced fracture and underwent surgery for it. Post op, he was noted to not follow commands and weak all over. 11/3, code stroke was activated with a LKW of 1634 for limited speech output. STAT CT Head with no acute abnormalities. CTA with no LVO. Presentation more concerning for toxic metabolic etiology and delirium rather than focal finding concerning for stroke.   Toxic--Metabolic Encephalopathy likely from worsening renal function CT Head without contrast(Personally reviewed): CTH was negative for a large hypodensity concerning for a large territory infarct or hyperdensity concerning for an ICH CT angio Head and Neck with contrast(Personally reviewed): No LVO MRI Brain: 1. No acute intracranial abnormality. 2. Similar mild for age chronic microvascular ischemic change. 3. Subtle chronic superficial siderosis along the left sylvian fissure. LDL 66 HgbA1c 6.0 EEG This study is suggestive of generalized cerebral dysfunction  (encephalopathy). No seizures or epileptiform discharges were seen throughout the recording.  VTE prophylaxis - per Ortho aspirin  81 mg daily prior to admission, now on aspirin  81 mg daily or ASA suppository daily.  Therapy recommendations:  Pending Disposition:  pending   Hx of Stroke/TIA SDH 2019 Global aphasia that resolved   Hypertension Home meds:  none Stable BP goal: normotension   Hyperlipidemia Home meds:  none LDL 66, goal < 70 High intensity statin not indicated due to LDLwithin goal    Diabetes type II, Pre-diabetic Home meds:  none HgbA1c 6.0, goal < 7.0 CBGs SSI Recommend close follow-up with PCP    Tobacco Abuse Former cigarette smoker Patient smoked a pack a day for 35 years   Dysphagia Patient has post-stroke dysphagia, SLP consulted    Diet   Diet NPO time specified   Advance diet as tolerated  Other Stroke Risk Factors Coronary artery disease Advanced Age   Other Active Problems History of GI bleed  Hospital day # 4   Karna Geralds DNP, ACNPC-AG  [facility] I have  personally obtained history,examined this patient, reviewed notes, independently viewed imaging studies, participated in medical decision making and plan of care.ROS completed by me personally and pertinent positives fully documented  I have made any additions or clarifications directly to the above note. Agree with note above.  Patient remains encephalopathic with mild renal insufficiency and anemia.  Kindly correct these parameters as per primary team.  Stroke team will sign off.  Kindly call for questions.  Eather Popp, MD Medical Director [facility] Pager: [phone] 01/17/2024 1:16 PM    To contact Stroke Continuity provider, please refer to [email or web address]. After hours, contact General Neurology

## 2024-01-17 NOTE — Plan of Care (Signed)
   Problem: Education: Goal: Knowledge of General Education information will improve Description: Including pain rating scale, medication(s)/side effects and non-pharmacologic comfort measures Outcome: Progressing   Problem: Clinical Measurements: Goal: Ability to maintain clinical measurements within normal limits will improve Outcome: Progressing Goal: Will remain free from infection Outcome: Progressing

## 2024-01-18 DIAGNOSIS — S72001A Fracture of unspecified part of neck of right femur, initial encounter for closed fracture: Secondary | ICD-10-CM | POA: Diagnosis not present

## 2024-01-18 LAB — BASIC METABOLIC PANEL WITH GFR
Anion gap: 9 (ref 5–15)
BUN: 40 mg/dL — ABNORMAL HIGH (ref 8–23)
CO2: 26 mmol/L (ref 22–32)
Calcium: 8.2 mg/dL — ABNORMAL LOW (ref 8.9–10.3)
Chloride: 104 mmol/L (ref 98–111)
Creatinine, Ser: 1.91 mg/dL — ABNORMAL HIGH (ref 0.61–1.24)
GFR, Estimated: 34 mL/min — ABNORMAL LOW (ref >60)
Glucose, Bld: 121 mg/dL — ABNORMAL HIGH (ref 70–99)
Potassium: 3.8 mmol/L (ref 3.5–5.1)
Sodium: 139 mmol/L (ref 135–145)

## 2024-01-18 LAB — CBC
HCT: 23.6 % — ABNORMAL LOW (ref 39.0–52.0)
Hemoglobin: 7.7 g/dL — ABNORMAL LOW (ref 13.0–17.0)
MCH: 25.7 pg — ABNORMAL LOW (ref 26.0–34.0)
MCHC: 32.6 g/dL (ref 30.0–36.0)
MCV: 78.7 fL — ABNORMAL LOW (ref 80.0–100.0)
Platelets: 313 K/uL (ref 150–400)
RBC: 3 MIL/uL — ABNORMAL LOW (ref 4.22–5.81)
RDW: 17 % — ABNORMAL HIGH (ref 11.5–15.5)
WBC: 6.7 K/uL (ref 4.0–10.5)
nRBC: 0 % (ref 0.0–0.2)

## 2024-01-18 LAB — GLUCOSE, CAPILLARY
Glucose-Capillary: 139 mg/dL — ABNORMAL HIGH (ref 70–99)
Glucose-Capillary: 220 mg/dL — ABNORMAL HIGH (ref 70–99)
Glucose-Capillary: 222 mg/dL — ABNORMAL HIGH (ref 70–99)
Glucose-Capillary: 239 mg/dL — ABNORMAL HIGH (ref 70–99)
Glucose-Capillary: 246 mg/dL — ABNORMAL HIGH (ref 70–99)
Glucose-Capillary: 252 mg/dL — ABNORMAL HIGH (ref 70–99)
Glucose-Capillary: 255 mg/dL — ABNORMAL HIGH (ref 70–99)
Glucose-Capillary: 263 mg/dL — ABNORMAL HIGH (ref 70–99)

## 2024-01-18 LAB — VITAMIN B1: Vitamin B1 (Thiamine): 95.7 nmol/L (ref 66.5–200.0)

## 2024-01-18 LAB — TYPE AND SCREEN
ABO/RH(D): O POS
Antibody Screen: NEGATIVE

## 2024-01-18 LAB — MAGNESIUM: Magnesium: 2.1 mg/dL (ref 1.7–2.4)

## 2024-01-18 LAB — OCCULT BLOOD X 1 CARD TO LAB, STOOL: Fecal Occult Bld: NEGATIVE

## 2024-01-18 LAB — PHOSPHORUS: Phosphorus: 1.3 mg/dL — ABNORMAL LOW (ref 2.5–4.6)

## 2024-01-18 MED ORDER — ASPIRIN 81 MG PO TBEC: 81.0000 mg | DELAYED_RELEASE_TABLET | Freq: Two times a day (BID) | ORAL | 0 refills | Status: AC

## 2024-01-18 MED ORDER — OSMOLITE 1.5 CAL PO LIQD
720.0000 mL | ORAL | Status: DC
Start: 2024-01-18 — End: 2024-01-22
  Administered 2024-01-18 – 2024-01-21 (×4): 720 mL
  Filled 2024-01-18 (×3): qty 948
  Filled 2024-01-18: qty 1000
  Filled 2024-01-18: qty 948
  Filled 2024-01-18: qty 1000

## 2024-01-18 MED ORDER — THIAMINE MONONITRATE 100 MG PO TABS
100.0000 mg | ORAL_TABLET | Freq: Every day | ORAL | Status: AC
  Administered 2024-01-19 – 2024-01-23 (×5): 100 mg via ORAL
  Filled 2024-01-18 (×5): qty 1

## 2024-01-18 MED ORDER — POLYETHYLENE GLYCOL 3350 17 G PO PACK: 17.0000 g | PACK | Freq: Every day | ORAL | 0 refills | Status: AC

## 2024-01-18 MED ORDER — SENNA 8.6 MG PO TABS: 1.0000 | ORAL_TABLET | Freq: Two times a day (BID) | ORAL | 0 refills | Status: AC

## 2024-01-18 MED ORDER — K PHOS MONO-SOD PHOS DI & MONO 155-852-130 MG PO TABS
500.0000 mg | ORAL_TABLET | Freq: Two times a day (BID) | ORAL | Status: AC
  Administered 2024-01-18 (×2): 500 mg
  Filled 2024-01-18 (×2): qty 2

## 2024-01-18 MED ORDER — FREE WATER
100.0000 mL | Freq: Four times a day (QID) | Status: DC
  Administered 2024-01-18 – 2024-01-22 (×11): 100 mL

## 2024-01-18 MED ORDER — HYDROCODONE-ACETAMINOPHEN 5-325 MG PO TABS: 1.0000 | ORAL_TABLET | ORAL | 0 refills | Status: AC | PRN

## 2024-01-18 NOTE — Progress Notes (Signed)
 Occupational Therapy Treatment Patient Details Name: Juan Goodwin MRN: [id number] DOB: [birth year] Today's Date: 01/18/2024   History of present illness 88 yo male presents to ED 11/2 with fall x1 week ago, subsequent R hip pain and + head trauma. Pt sustained R femoral neck fx s/p hemiarthroplasty (WBAT, posterior hip precautions) 11/3. Code stroke called post-op, CTH and CTA negative for acute findings, MRI negative. EEG negative. PMH includes AAA  s/p recent infrarenal endovascular aortic repair with coil of the left internal iliac artery on 01/04/2024, CAD, CKD, hypertension, MI, SDH, duodenal ulcer.   OT comments  Pt progressing well, much clearer mentally today. Disoriented to year but able to correct with cueing, continue to have difficulty with problem solving and multiple step command following.  Mobilizing with mod assist +2 for bed mobility, min assist +2 for transfers and stepping to recliner using RW, and needing min to total assist for ADLs.  Sitting EOB with supervision.  Able to recall 1/3 hip precautions at end of session.  Continue to recommend <3hrs/day inpatient setting at dc.  Updated goals today. Will follow acutely.       If plan is discharge home, recommend the following:  Assistance with cooking/housework;Direct supervision/assist for financial management;Assist for transportation;Help with stairs or ramp for entrance;A lot of help with walking and/or transfers;A lot of help with bathing/dressing/bathroom;Assistance with feeding;Direct supervision/assist for medications management;Supervision due to cognitive status   Equipment Recommendations  BSC/3in1;Other (comment) (defer)    Recommendations for Other Services      Precautions / Restrictions Precautions Precautions: Fall;Posterior Hip Precaution Booklet Issued: Yes (comment) Recall of Precautions/Restrictions: Impaired Precaution/Restrictions Comments: R posterior hip precautions, abduction pillow soiled- placed  pillow between knees (asking RN to call for new abduction pillow) Restrictions Weight Bearing Restrictions Per Provider Order: Yes RLE Weight Bearing Per Provider Order: Weight bearing as tolerated       Mobility Bed Mobility Overal bed mobility: Needs Assistance Bed Mobility: Supine to Sit     Supine to sit: Mod assist, +2 for safety/equipment     General bed mobility comments: assist for RLE and scooting hips forward, cueing for technique and safety/precautions; 2nd stand with stedy, min assist and cueing for hand placement to adhere to prec (pushing up from chair with R hand)    Transfers Overall transfer level: Needs assistance Equipment used: Rolling walker (2 wheels) Transfers: Sit to/from Stand Sit to Stand: Min assist, +2 safety/equipment           General transfer comment: min assist to power up and steady initally, with cueing for technique for precuations     Balance Overall balance assessment: Needs assistance Sitting-balance support: No upper extremity supported, Feet supported Sitting balance-Leahy Scale: Fair Sitting balance - Comments: close supervision statically   Standing balance support: Bilateral upper extremity supported, During functional activity, Reliant on assistive device for balance Standing balance-Leahy Scale: Fair Standing balance comment: min to contact guard with BUE support                           ADL either performed or assessed with clinical judgement   ADL Overall ADL's : Needs assistance/impaired     Grooming: Set up;Sitting           Upper Body Dressing : Minimal assistance;Sitting   Lower Body Dressing: Total assistance;+2 for physical assistance;Sit to/from stand Lower Body Dressing Details (indicate cue type and reason): min assist +2 to stand Toilet Transfer: Minimal assistance;+2  for physical assistance;Cueing for safety;Cueing for sequencing;Rolling walker (2 wheels) Toilet Transfer Details (indicate  cue type and reason): stand step to recliner         Functional mobility during ADLs: Minimal assistance;Moderate assistance;+2 for safety/equipment;+2 for physical assistance      Extremity/Trunk Assessment Upper Extremity Assessment Upper Extremity Assessment: Generalized weakness   Lower Extremity Assessment Lower Extremity Assessment: Defer to PT evaluation        Vision   Additional Comments: not formally assessed but vision appears much more functional today, no gaze preference or difficulty scanning to L side   Perception     Praxis     Communication Communication Communication: Impaired Factors Affecting Communication: Hearing impaired   Cognition Arousal: Alert Behavior During Therapy: Flat affect Cognition: Cognition impaired   Orientation impairments: Time (initally reports 2023 but able to correct with cueing) Awareness: Intellectual awareness intact, Online awareness impaired Memory impairment (select all impairments): Working civil service fast streamer, Short-term memory Attention impairment (select first level of impairment): Sustained attention Executive functioning impairment (select all impairments): Sequencing, Problem solving, Organization OT - Cognition Comments: pt able to correct year with cueing, follows simple commands and demonstrating improved awareness to situation. Able to recall 1/3 hip precautions at end of session                 Following commands: Impaired Following commands impaired: Follows one step commands with increased time, Follows multi-step commands inconsistently      Cueing   Cueing Techniques: Verbal cues, Tactile cues  Exercises      Shoulder Instructions       General Comments VSS on RA    Pertinent Vitals/ Pain       Pain Assessment Pain Assessment: Faces Faces Pain Scale: Hurts a little bit Pain Location: R hip Pain Descriptors / Indicators: Discomfort, Grimacing Pain Intervention(s): Monitored during session,  Repositioned  Home Living                                          Prior Functioning/Environment              Frequency  Min 2X/week        Progress Toward Goals  OT Goals(current goals can now be found in the care plan section)  Progress towards OT goals: Goals met and updated - see care plan  Acute Rehab OT Goals Patient Stated Goal: get better OT Goal Formulation: With patient Time For Goal Achievement: 01/30/24 Potential to Achieve Goals: Good  Plan      Co-evaluation    PT/OT/SLP Co-Evaluation/Treatment: Yes Reason for Co-Treatment: For patient/therapist safety;To address functional/ADL transfers PT goals addressed during session: Mobility/safety with mobility OT goals addressed during session: ADL's and self-care      AM-PAC OT 6 Clicks Daily Activity     Outcome Measure   Help from another person eating meals?: A Lot Help from another person taking care of personal grooming?: A Lot Help from another person toileting, which includes using toliet, bedpan, or urinal?: Total Help from another person bathing (including washing, rinsing, drying)?: A Lot Help from another person to put on and taking off regular upper body clothing?: A Little Help from another person to put on and taking off regular lower body clothing?: A Lot 6 Click Score: 12    End of Session Equipment Utilized During Treatment: Gait belt;Rolling walker (2 wheels)  OT  Visit Diagnosis: Other abnormalities of gait and mobility (R26.89);Muscle weakness (generalized) (M62.81);Other symptoms and signs involving the nervous system (R29.898);Other symptoms and signs involving cognitive function;History of falling (Z91.81);Cognitive communication deficit (R41.841)   Activity Tolerance Patient tolerated treatment well   Patient Left in bed;with call bell/phone within reach;with bed alarm set   Nurse Communication Mobility status;Precautions;Need for lift equipment         Time: [phone] OT Time Calculation (min): 28 min  Charges: OT General Charges $OT Visit: 1 Visit OT Treatments $Self Care/Home Management : 8-22 mins  Etta NOVAK, OT Acute Rehabilitation Services Office [phone] Secure Chat Preferred    Etta GORMAN Hope 01/18/2024, 9:11 AM

## 2024-01-18 NOTE — Progress Notes (Signed)
 Speech Language Pathology Treatment: Dysphagia;Cognitive-Linguistic  Patient Details Name: Juan Goodwin MRN: [id number] DOB: 1935/10/24 Today's Date: 01/18/2024 Time: [id number] SLP Time Calculation (min) (ACUTE ONLY): 23 min  Assessment / Plan / Recommendation Clinical Impression  Pt much improved today, speech WNL, oriented to self, place and situation. Needed cues for time, but able to read clock. Also requests hearing aids and placed them himself. Feeding himself after set up assist given. Needed instruction on use of call bell but able to verbalize and demonstrate what to do to ask for help after a delay. Pt tolerating meal, but food was cold by the time he got assist to reach the tray and he didn't find it appetizing. But he did eat all of a banana and all his liquids. Tried some eggs with encouragement. Pt also checking in about mobility precautions independently, knows he can't bend more than 90 degrees. Appears back to baseline cognition. No SLP f/u needed at this time. Continue cut up foods to aid in self feeding as needed.   HPI HPI: 88 yo male presents to ED 11/2 with fall x1 week ago, subsequent R hip pain and + head trauma. Pt sustained R femoral neck fx s/p hemiarthroplasty (WBAT, posterior hip precautions) 11/3. Code stroke called post-op, CTH and CTA negative for acute findings, MRI negative. Pt cognition relatively normal at baseline, slow processing reported.  PMH includes: aphasia, AAA  s/p recent infrarenal endovascular aortic repair with coil of the left internal iliac artery on 01/04/2024, CAD, CKD, hypertension, MI, SDH s/p craniotomy for evacuation 2019, duodenal ulcer. Previous swallow evals x2 in 2019 both without overt signs of aspiration, mild oral deficits noted.      SLP Plan  Continue with current plan of care          Recommendations  Diet recommendations: Dysphagia 3 (mechanical soft);Thin liquid Liquids provided via: Cup;Straw Medication Administration:  Whole meds with liquid Supervision: Staff to assist with self feeding (set up assist)                              Continue with current plan of care     Aadam Zhen, [name]  01/18/2024, 10:05 AM

## 2024-01-18 NOTE — Plan of Care (Signed)
   Problem: Education: Goal: Knowledge of General Education information will improve Description Including pain rating scale, medication(s)/side effects and non-pharmacologic comfort measures Outcome: Progressing   Problem: Health Behavior/Discharge Planning: Goal: Ability to manage health-related needs will improve Outcome: Progressing

## 2024-01-18 NOTE — Progress Notes (Signed)
 Physical Therapy Treatment Patient Details Name: Juan Goodwin MRN: [id number] DOB: August 28, 1935 Today's Date: 01/18/2024   History of Present Illness 88 yo male presents to ED 11/2 with fall x1 week ago, subsequent R hip pain and + head trauma. Pt sustained R femoral neck fx s/p hemiarthroplasty (WBAT, posterior hip precautions) 11/3. Code stroke called post-op, CTH and CTA negative for acute findings, MRI negative. EEG negative. PMH includes AAA  s/p recent infrarenal endovascular aortic repair with coil of the left internal iliac artery on 01/04/2024, CAD, CKD, hypertension, MI, SDH, duodenal ulcer.    PT Comments  Pt tolerates treatment well, much more alert and with improved mobility. Pt is able to progress to out of bed transfers with physical assistance of PT and verbal cues to maintain hip precautions. Pt will benefit from frequent mobilization in an effort to improve mobility quality and to reduce risk for falls. Patient will benefit from continued inpatient follow up therapy, <3 hours/day.    If plan is discharge home, recommend the following: Two people to help with walking and/or transfers;Two people to help with bathing/dressing/bathroom;Assistance with feeding;Assistance with cooking/housework;Direct supervision/assist for medications management;Direct supervision/assist for financial management;Assist for transportation   Can travel by private vehicle     No  Equipment Recommendations  Rolling walker (2 wheels);Wheelchair (measurements PT);Wheelchair cushion (measurements PT)    Recommendations for Other Services       Precautions / Restrictions Precautions Precautions: Fall;Posterior Hip Precaution Booklet Issued: Yes (comment) Recall of Precautions/Restrictions: Impaired Precaution/Restrictions Comments: R posterior hip precautions, abduction pillow soiled- new pillow delivered after session Restrictions Weight Bearing Restrictions Per Provider Order: Yes RLE Weight  Bearing Per Provider Order: Weight bearing as tolerated     Mobility  Bed Mobility Overal bed mobility: Needs Assistance Bed Mobility: Supine to Sit     Supine to sit: Mod assist, +2 for safety/equipment     General bed mobility comments: assist for RLE and scooting hips forward, cueing for technique and safety/precautions; 2nd stand with stedy, min assist and cueing for hand placement to adhere to prec (pushing up from chair with R hand)    Transfers Overall transfer level: Needs assistance Equipment used: Rolling walker (2 wheels) Transfers: Sit to/from Stand Sit to Stand: Min assist, +2 safety/equipment           General transfer comment: min assist to power up and steady initally, with cueing for technique for precuations. 2nd stand attempt from recliner with STEDY    Ambulation/Gait Ambulation/Gait assistance: Min assist Gait Distance (Feet): 2 Feet Assistive device: Rolling walker (2 wheels) Gait Pattern/deviations: Step-to pattern Gait velocity: reduced Gait velocity interpretation: <1.31 ft/sec, indicative of household ambulator   General Gait Details: short shuffling steps, cues for direction anid hip precautions   Stairs             Wheelchair Mobility     Tilt Bed    Modified Rankin (Stroke Patients Only)       Balance Overall balance assessment: Needs assistance Sitting-balance support: No upper extremity supported, Feet supported Sitting balance-Leahy Scale: Fair     Standing balance support: Bilateral upper extremity supported, Reliant on assistive device for balance Standing balance-Leahy Scale: Poor                              Communication Communication Communication: Impaired Factors Affecting Communication: Hearing impaired  Cognition Arousal: Alert Behavior During Therapy: Flat affect   PT - Cognitive  impairments: No family/caregiver present to determine baseline   Orientation impairments: Time                      Following commands: Impaired Following commands impaired: Follows one step commands with increased time, Follows multi-step commands inconsistently    Cueing Cueing Techniques: Verbal cues, Tactile cues  Exercises      General Comments General comments (skin integrity, edema, etc.): VSS on RA      Pertinent Vitals/Pain Pain Assessment Pain Assessment: Faces Faces Pain Scale: Hurts even more Pain Location: R hip Pain Descriptors / Indicators: Sore Pain Intervention(s): Monitored during session    Home Living                          Prior Function            PT Goals (current goals can now be found in the care plan section) Acute Rehab PT Goals Patient Stated Goal: to improve mobility quality Progress towards PT goals: Progressing toward goals    Frequency    Min 2X/week      PT Plan      Co-evaluation PT/OT/SLP Co-Evaluation/Treatment: Yes Reason for Co-Treatment: Complexity of the patient's impairments (multi-system involvement);To address functional/ADL transfers;For patient/therapist safety PT goals addressed during session: Mobility/safety with mobility;Balance;Strengthening/ROM;Proper use of DME OT goals addressed during session: ADL's and self-care      AM-PAC PT 6 Clicks Mobility   Outcome Measure  Help needed turning from your back to your side while in a flat bed without using bedrails?: A Lot Help needed moving from lying on your back to sitting on the side of a flat bed without using bedrails?: A Lot Help needed moving to and from a bed to a chair (including a wheelchair)?: A Little Help needed standing up from a chair using your arms (e.g., wheelchair or bedside chair)?: A Little Help needed to walk in hospital room?: Total Help needed climbing 3-5 steps with a railing? : Total 6 Click Score: 12    End of Session Equipment Utilized During Treatment: Gait belt Activity Tolerance: Patient tolerated treatment  well Patient left: in chair;with call bell/phone within reach;with chair alarm set Nurse Communication: Mobility status PT Visit Diagnosis: Other abnormalities of gait and mobility (R26.89);History of falling (Z91.81);Muscle weakness (generalized) (M62.81);Difficulty in walking, not elsewhere classified (R26.2)     Time: 9175-9145 PT Time Calculation (min) (ACUTE ONLY): 30 min  Charges:    $Therapeutic Activity: 8-22 mins PT General Charges $$ ACUTE PT VISIT: 1 Visit                     Bernardino JINNY Ruth, PT, DPT Acute Rehabilitation Office [phone]    Bernardino JINNY Ruth 01/18/2024, 9:43 AM

## 2024-01-18 NOTE — Progress Notes (Signed)
 PROGRESS NOTE    Juan Goodwin  FMW:[id number] DOB: October 10, 1935 DOA: 01/13/2024 PCP: [name], MD   Brief Narrative: Juan Goodwin is a 87 y.o. male with a history of AAA, CAD, CKD stage IIIa, subdural hematoma, duodenal ulcer, CAD, hyperlipidemia, primary pretension.  Patient presented secondary to a fall and resultant right femoral neck fracture.  Orthopedic surgery was consulted and performed a right hip hemiarthroplasty on 11/3.  Hospitalization complicated by resultant metabolic encephalopathy of unknown etiology.   Assessment and Plan:  Right femoral neck fracture Secondary to fall. Orthopedic surgery consulted and performed a right hemiarthroplasty on 11/3.  Orthopedic surgery recommendation for weightbearing as tolerated regarding right lower extremity with posterior hip precautions in addition to aspirin  81 mg twice daily for DVT prophylaxis.  Acute metabolic encephalopathy Presumed diagnosis, although unclear etiology. Symptoms occurred post-surgery. CT head and MRI brain without evidence of acute intracranial process. EEG obtained and was negative for evidence of seizure activity. Some worsening renal function with BUN up to 29. TSH, Vitamin B12, folate, ammonia level do not explain symptoms. Mentation continues to improve.  AKI on CKD stage IIIa Baseline creatinine of about 1.3-1.4. This admission, creatinine up to a peak of 2.57. Worsened with initial IV fluids. Urine output quantity not documented. Blood pressure appears to have been adequate during surgery without evidence of significant hypotension. Renal ultrasound significant for evidence of medical renal disease and bladder suggestive of history of retention. Creatinine has reached peak and is trending down. -Strict in/out -BMP in AM  Dysphagia Secondary to mental status change.  Patient is seen and evaluated by speech therapy who recommends n.p.o. status.  Discussed with family and decision made to have  Cortrak feeding tube placed temporarily. Speech therapy reevaluated and patient's diet advanced to a dysphagia 3 diet. Oral intake is improving. -Cortrak NG tube -Dietitian recommendations -SLP recommendations (11/7): Diet recommendations: Dysphagia 3 (mechanical soft);Thin liquid Liquids provided via: Cup;Straw Medication Administration: Whole meds with liquid Supervision: Staff to assist with self feeding (set up assist)  Hyponatremia Mild. Resolved.  Elevated LFTs Mild. Unclear etiology but improving.  History of CAD Noted.  Patient with prior NSTEMI.  Patient is on metoprolol and Crestor  as an outpatient.  Currently on aspirin  for DVT prophylaxis per orthopedic surgery.  Chronic anemia Likely anemia of chronic disease secondary to known CKD. Baseline hemoglobin of around 9-10. Continued to drift down post-operatively to a low of 7.3 and has stabilized. -Trend CBC  Primary hypertension Uncontrolled on admission.  History of abdominal aortic aneurysm Patient is status post recent infrarenal endovascular aortic repair with coil of the left internal iliac artery    DVT prophylaxis: Per orthopedic surgery: aspirin  81 mg BID Code Status:   Code Status: Full Code Family Communication: Daughter via telephone Disposition Plan: Likely to skilled nursing facility pending improvement of mental status, stable hemoglobin, and improved oral intake   Consultants:  Orthopedic surgery Neurology  Procedures:  Right hip hemiarthroplasty  Antimicrobials: Vancomycin  Cefazolin    Subjective: Patient states he does not feel well but can't explain why specifically.  Objective: BP (!) 140/77 (BP Location: Right Arm)   Pulse 93   Temp 98.6 F (37 C) (Oral)   Resp (!) 23   Ht 6' 1 (1.854 m)   Wt 69.4 kg   SpO2 100%   BMI 20.19 kg/m   Examination:  General exam: Appears calm and comfortable. Respiratory system: Clear to auscultation. Respiratory effort normal. Cardiovascular  system: S1 & S2 heard,  RRR. Gastrointestinal system: Abdomen is nondistended, soft and nontender. Normal bowel sounds heard. Central nervous system: Alert and oriented to person and time, but not place.   Data Reviewed: I have personally reviewed following labs and imaging studies  CBC Lab Results  Component Value Date   WBC 6.7 01/18/2024   RBC 3.00 (L) 01/18/2024   HGB 7.7 (L) 01/18/2024   HCT 23.6 (L) 01/18/2024   MCV 78.7 (L) 01/18/2024   MCH 25.7 (L) 01/18/2024   PLT 313 01/18/2024   MCHC 32.6 01/18/2024   RDW 17.0 (H) 01/18/2024   LYMPHSABS 1.4 12/07/2023   MONOABS 0.3 12/07/2023   EOSABS 0.1 12/07/2023   BASOSABS 0.0 12/07/2023     Last metabolic panel Lab Results  Component Value Date   NA 139 01/18/2024   K 3.8 01/18/2024   CL 104 01/18/2024   CO2 26 01/18/2024   BUN 40 (H) 01/18/2024   CREATININE 1.91 (H) 01/18/2024   GLUCOSE 121 (H) 01/18/2024   GFRNONAA 34 (L) 01/18/2024   GFRAA 46 (L) 06/08/2018   CALCIUM  8.2 (L) 01/18/2024   PHOS 1.3 (L) 01/18/2024   PROT 6.0 (L) 01/17/2024   ALBUMIN  2.0 (L) 01/17/2024   LABGLOB 4.2 (H) 11/06/2023   BILITOT 0.6 01/17/2024   ALKPHOS 81 01/17/2024   AST 50 (H) 01/17/2024   ALT 10 01/17/2024   ANIONGAP 9 01/18/2024    GFR: Estimated Creatinine Clearance: 27.3 mL/min (A) (by C-G formula based on SCr of 1.91 mg/dL (H)).  Recent Results (from the past 240 hours)  Surgical pcr screen     Status: None   Collection Time: 01/14/24  4:47 AM   Specimen: Nasal Mucosa; Nasal Swab  Result Value Ref Range Status   MRSA, PCR NEGATIVE NEGATIVE Final   Staphylococcus aureus NEGATIVE NEGATIVE Final    Comment: (NOTE) The Xpert SA Assay (FDA approved for NASAL specimens in patients 81 years of age and older), is one component of a comprehensive surveillance program. It is not intended to diagnose infection nor to guide or monitor treatment. Performed at [facility], [address], [place], [place] [address]        Radiology Studies: DG CHEST PORT 1 VIEW Result Date: 01/16/2024 CLINICAL DATA:  Tachypnea EXAM: PORTABLE CHEST 1 VIEW COMPARISON:  10/11/2020 FINDINGS: Single frontal view of the chest demonstrates enteric catheter passing below diaphragm, tip projecting over the gastric body. Cardiac silhouette is unremarkable. No acute airspace disease, effusion, or pneumothorax. No acute bony abnormalities. IMPRESSION: 1. No acute intrathoracic process. Electronically Signed   By: Ozell Daring M.D.   On: 01/16/2024 20:09   US  RENAL Result Date: 01/16/2024 CLINICAL DATA:  Acute kidney injury. EXAM: RENAL / URINARY TRACT ULTRASOUND COMPLETE COMPARISON:  September 23, 2004 FINDINGS: Right Kidney: Renal measurements: 7.9 cm x 4.3 cm x 4.8 cm = volume: 85.3 mL. There is increased echogenicity of the renal parenchyma. No mass or hydronephrosis visualized. Left Kidney: Renal measurements: 8.9 cm x 4.8 cm x 4.2 cm = volume: 94.5 mL. There is increased echogenicity of the renal parenchyma. 3.2 cm x 2.6 cm x 2.5 cm and 0.7 cm x 0.8 cm x 0.7 cm simple cysts are seen within the upper pole of the left kidney. No hydronephrosis is visualized. Bladder: The urinary bladder wall is trabeculated in appearance. Other: The prostate gland measures 2.9 cm x 2.9 cm x 4.6 cm. The study is technically difficult secondary to limited patient positioning, as per the ultrasound technologist. IMPRESSION: 1.  Bilateral echogenic kidneys which may represent sequelae associated with medical renal disease. 2. Simple left renal cysts. 3. Urinary bladder trabeculation which may represent sequelae associated with chronic urinary bladder outlet obstruction. Electronically Signed   By: Suzen Dials M.D.   On: 01/16/2024 19:37      LOS: 5 days    Elgin Lam, MD [facility] 01/18/2024, 12:00 PM   If 7PM-7AM, please contact night-coverage [email or web address]

## 2024-01-18 NOTE — Progress Notes (Addendum)
 Nutrition Follow-up  DOCUMENTATION CODES:   Severe malnutrition in context of chronic illness  INTERVENTION:  Transition to nocturnal tube feeding via Cortrak: Osmolite 1.5  at 60 ml/hr x 12 hours from 1800-0600 (720 ml per day) 100 ml FWF Q6H  Provides 1080 kcal, 45 gm protein, 548 ml free water daily (948 ml water daily TF + FWF) *Recommend continuing tube feeds until pt consistently eating >50% of his meals or meeting >60% of his nutritional needs by po.   Encourage PO intake: Nursing to assist with meals Room service with assist  Went over preordering melas with daughter at bedside Ensure Plus High Protein po BID, each supplement provides 350 kcal and 20 grams of protein Magic cup TID with meals, each supplement provides 290 kcal and 9 grams of protein 100 mg Thiamine x 7 days po MVI with minerals daily po 1,000 units vitamin D  daily po Monitor magnesium , potassium, and phosphorus daily for at least 3 days, MD to replete as needed, as pt is at risk for refeeding syndrome Calorie count   NUTRITION DIAGNOSIS:   Severe Malnutrition related to chronic illness as evidenced by severe muscle depletion, severe fat depletion. - Ongoing   GOAL:   Patient will meet greater than or equal to 90% of their needs - Progressing   MONITOR:   PO intake, Supplement acceptance, Diet advancement, Labs, Skin  REASON FOR ASSESSMENT:   Consult Assessment of nutrition requirement/status, Hip fracture protocol  ASSESSMENT:  88 y.o. male with PMH of AAA, CAD, CKD 3a, duodenal ulcer/history of GI bleed, CKD stage IIIa, CAD, HLD, HTN,  MI. Admitted after ground level fall and found to have right femoral neck fracture s/p rigth hemiarthoplasty. Post op complicated from AMS, transferred to progressive unit for further evaluation.  11/2 - Imaging of right hip and pelvis showed displaced subcapital femoral neck fracture  11/3 - s/p Right Hemiarthroplasty  11/4 - Change of mental status, code  stroke CT negative for acute abnormality, Transferred to progressive, MRI negative, SLP eval->NPO 11/5 - Cortrak placed, tube feeds started  11/6 - Dysphagia 3, thin liquids, tube feeds at 40 ml/hr  11/7 - Tube feeds at goal, transition to nocturnal tube feeds   Pt doing remarkably well today, this is the best I have seen him since admission. Pt up in chair with daughter at bedside. Awake and alert and able to answer questions. Has an appetite, had an Enusre for breakfast today, was working on lunch during assessment.  Encouraged pt to increase intake and drink ONS to get cortrak removed, pt and daughter acknowledged. Pt seems motivated to eat more however has a hx of fluctuating mental status. Transition to nocturnal tube feeds to stimulate appetite and start calorie count. If pt is eating >50% of his meals or meeting >60% of his nutritional needs by po, and drinking Ensures, can remove cortrak. RN, MD aware.   SLP signed off.   Admit weight: 66.8 kg Current weight: 69.4 kg    Average Meal Intake: 11/6: 25% intake x 1 recorded meals  Nutritionally Relevant Medications: Scheduled Meds:  cholecalciferol  1,000 Units Oral Daily   feeding supplement  237 mL Oral BID BM   feeding supplement (OSMOLITE 1.5 CAL)  720 mL Per Tube Q24H   Influenza vac split trivalent PF  0.5 mL Intramuscular Tomorrow-1000   insulin aspart  0-9 Units Subcutaneous Q4H   multivitamin with minerals  1 tablet Oral Daily   phosphorus  500 mg Per Tube BID  thiamine  100 mg Oral Daily   Labs Reviewed: BUN 40 Creatinine 1.91 GFR 34 CBG ranges from 139-246 mg/dL over the last 24 hours HgbA1c 6.0  Diet Order:   Diet Order             DIET DYS 3 Room service appropriate? Yes with Assist; Fluid consistency: Thin  Diet effective now                   EDUCATION NEEDS:   Not appropriate for education at this time  Skin:  Skin Assessment: Skin Integrity Issues: Skin Integrity Issues:: Incisions Incisions:  Closed surgical incision left groin  Last BM:  11/7  Height:   Ht Readings from Last 1 Encounters:  01/14/24 6' 1 (1.854 m)    Weight:   Wt Readings from Last 1 Encounters:  01/18/24 69.4 kg    Ideal Body Weight:  83.6 kg  BMI:  Body mass index is 20.19 kg/m.  Estimated Nutritional Needs:   Kcal:  1800-2000 kcal  Protein:  90-110 gm  Fluid:  >1.8L/day   Olivia Kenning, RD Registered Dietitian  See Amion for more information

## 2024-01-18 NOTE — Inpatient Diabetes Management (Signed)
 Inpatient Diabetes Program Recommendations  AACE/ADA: New Consensus Statement on Inpatient Glycemic Control (2015)  Target Ranges:  Prepandial:   less than 140 mg/dL      Peak postprandial:   less than 180 mg/dL (1-2 hours)      Critically ill patients:  140 - 180 mg/dL   Lab Results  Component Value Date   GLUCAP 239 (H) 01/18/2024   HGBA1C 6.0 (H) 01/13/2024    Review of Glycemic Control  Latest Reference Range & Units 01/17/24 07:59 01/17/24 11:36 01/17/24 15:51 01/17/24 19:37 01/18/24 00:04 01/18/24 03:08 01/18/24 07:51  Glucose-Capillary 70 - 99 mg/dL 781 (H) 766 (H) 756 (H) 237 (H) 220 (H) 139 (H) 239 (H)  (H): Data is abnormally high Diabetes history: Prediabetes Outpatient Diabetes medications: None Current orders for Inpatient glycemic control: Novolog 0-9 Q4H Osmolite @ 55 ml/hr HgbA1C - 6.0%   Inpatient Diabetes Program Recommendations:     If blood sugars > goal of 140-180 mg/dL AND to continue tube feeds, consider TF coverage - Novolog 2 units Q4H.     Thanks, Tinnie Minus, MSN, RNC-OB Diabetes Coordinator [phone] (8a-5p) m

## 2024-01-18 NOTE — Care Management Important Message (Signed)
 Important Message  Patient Details  Name: Juan Goodwin MRN: [id number] Date of Birth: 1935/05/25   Important Message Given:  Yes - Medicare IM     Jon Cruel 01/18/2024, 4:13 PM

## 2024-01-19 ENCOUNTER — Inpatient Hospital Stay (HOSPITAL_COMMUNITY)

## 2024-01-19 DIAGNOSIS — R6510 Systemic inflammatory response syndrome (SIRS) of non-infectious origin without acute organ dysfunction: Secondary | ICD-10-CM | POA: Diagnosis not present

## 2024-01-19 DIAGNOSIS — G928 Other toxic encephalopathy: Secondary | ICD-10-CM | POA: Diagnosis not present

## 2024-01-19 DIAGNOSIS — E43 Unspecified severe protein-calorie malnutrition: Secondary | ICD-10-CM | POA: Diagnosis not present

## 2024-01-19 DIAGNOSIS — I517 Cardiomegaly: Secondary | ICD-10-CM | POA: Diagnosis not present

## 2024-01-19 DIAGNOSIS — S72001A Fracture of unspecified part of neck of right femur, initial encounter for closed fracture: Secondary | ICD-10-CM | POA: Diagnosis not present

## 2024-01-19 LAB — GLUCOSE, CAPILLARY
Glucose-Capillary: 171 mg/dL — ABNORMAL HIGH (ref 70–99)
Glucose-Capillary: 181 mg/dL — ABNORMAL HIGH (ref 70–99)
Glucose-Capillary: 184 mg/dL — ABNORMAL HIGH (ref 70–99)
Glucose-Capillary: 211 mg/dL — ABNORMAL HIGH (ref 70–99)
Glucose-Capillary: 230 mg/dL — ABNORMAL HIGH (ref 70–99)
Glucose-Capillary: 231 mg/dL — ABNORMAL HIGH (ref 70–99)

## 2024-01-19 LAB — CBC
HCT: 26 % — ABNORMAL LOW (ref 39.0–52.0)
Hemoglobin: 8.5 g/dL — ABNORMAL LOW (ref 13.0–17.0)
MCH: 25.6 pg — ABNORMAL LOW (ref 26.0–34.0)
MCHC: 32.7 g/dL (ref 30.0–36.0)
MCV: 78.3 fL — ABNORMAL LOW (ref 80.0–100.0)
Platelets: 363 K/uL (ref 150–400)
RBC: 3.32 MIL/uL — ABNORMAL LOW (ref 4.22–5.81)
RDW: 17 % — ABNORMAL HIGH (ref 11.5–15.5)
WBC: 11 K/uL — ABNORMAL HIGH (ref 4.0–10.5)
nRBC: 0 % (ref 0.0–0.2)

## 2024-01-19 LAB — BASIC METABOLIC PANEL WITH GFR
Anion gap: 9 (ref 5–15)
BUN: 36 mg/dL — ABNORMAL HIGH (ref 8–23)
CO2: 28 mmol/L (ref 22–32)
Calcium: 8.2 mg/dL — ABNORMAL LOW (ref 8.9–10.3)
Chloride: 101 mmol/L (ref 98–111)
Creatinine, Ser: 1.56 mg/dL — ABNORMAL HIGH (ref 0.61–1.24)
GFR, Estimated: 43 mL/min — ABNORMAL LOW (ref >60)
Glucose, Bld: 155 mg/dL — ABNORMAL HIGH (ref 70–99)
Potassium: 3.7 mmol/L (ref 3.5–5.1)
Sodium: 138 mmol/L (ref 135–145)

## 2024-01-19 LAB — MAGNESIUM: Magnesium: 1.9 mg/dL (ref 1.7–2.4)

## 2024-01-19 LAB — HAPTOGLOBIN: Haptoglobin: 270 mg/dL (ref 38–329)

## 2024-01-19 LAB — PHOSPHORUS
Phosphorus: <1 mg/dL — CL (ref 2.5–4.6)
Phosphorus: 2.5 mg/dL (ref 2.5–4.6)

## 2024-01-19 LAB — POTASSIUM: Potassium: 3.9 mmol/L (ref 3.5–5.1)

## 2024-01-19 MED ORDER — SODIUM PHOSPHATES 45 MMOLE/15ML IV SOLN
30.0000 mmol | Freq: Once | INTRAVENOUS | Status: AC
  Administered 2024-01-19: 30 mmol via INTRAVENOUS
  Filled 2024-01-19: qty 10

## 2024-01-19 NOTE — Plan of Care (Signed)
 Problem: Education: Goal: Knowledge of General Education information will improve Description: Including pain rating scale, medication(s)/side effects and non-pharmacologic comfort measures 01/19/2024 1927 by Evander Arley SQUIBB, RN Outcome: Progressing 01/19/2024 1926 by Evander Arley SQUIBB, RN Outcome: Progressing   Problem: Health Behavior/Discharge Planning: Goal: Ability to manage health-related needs will improve 01/19/2024 1927 by Evander Arley SQUIBB, RN Outcome: Progressing 01/19/2024 1926 by Evander Arley SQUIBB, RN Outcome: Progressing   Problem: Clinical Measurements: Goal: Ability to maintain clinical measurements within normal limits will improve 01/19/2024 1927 by Evander Arley SQUIBB, RN Outcome: Progressing 01/19/2024 1926 by Evander Arley SQUIBB, RN Outcome: Progressing Goal: Will remain free from infection 01/19/2024 1927 by Evander Arley SQUIBB, RN Outcome: Progressing 01/19/2024 1926 by Evander Arley SQUIBB, RN Outcome: Progressing Goal: Diagnostic test results will improve 01/19/2024 1927 by Evander Arley SQUIBB, RN Outcome: Progressing 01/19/2024 1926 by Evander Arley SQUIBB, RN Outcome: Progressing Goal: Respiratory complications will improve 01/19/2024 1927 by Evander Arley SQUIBB, RN Outcome: Progressing 01/19/2024 1926 by Evander Arley SQUIBB, RN Outcome: Progressing Goal: Cardiovascular complication will be avoided 01/19/2024 1927 by Evander Arley SQUIBB, RN Outcome: Progressing 01/19/2024 1926 by Evander Arley SQUIBB, RN Outcome: Progressing   Problem: Activity: Goal: Risk for activity intolerance will decrease 01/19/2024 1927 by Evander Arley SQUIBB, RN Outcome: Progressing 01/19/2024 1926 by Evander Arley SQUIBB, RN Outcome: Progressing   Problem: Nutrition: Goal: Adequate nutrition will be maintained 01/19/2024 1927 by Evander Arley SQUIBB, RN Outcome: Progressing 01/19/2024 1926 by Evander Arley SQUIBB, RN Outcome: Progressing   Problem: Coping: Goal: Level of anxiety will decrease 01/19/2024 1927 by Evander Arley SQUIBB, RN Outcome: Progressing 01/19/2024 1926 by Evander Arley SQUIBB, RN Outcome: Progressing   Problem: Elimination: Goal: Will not experience complications related to bowel motility 01/19/2024 1927 by Evander Arley SQUIBB, RN Outcome: Progressing 01/19/2024 1926 by Evander Arley SQUIBB, RN Outcome: Progressing Goal: Will not experience complications related to urinary retention 01/19/2024 1927 by Evander Arley SQUIBB, RN Outcome: Progressing 01/19/2024 1926 by Evander Arley SQUIBB, RN Outcome: Progressing   Problem: Pain Managment: Goal: General experience of comfort will improve and/or be controlled 01/19/2024 1927 by Evander Arley SQUIBB, RN Outcome: Progressing 01/19/2024 1926 by Evander Arley SQUIBB, RN Outcome: Progressing   Problem: Safety: Goal: Ability to remain free from injury will improve 01/19/2024 1927 by Evander Arley SQUIBB, RN Outcome: Progressing 01/19/2024 1926 by Evander Arley SQUIBB, RN Outcome: Progressing   Problem: Skin Integrity: Goal: Risk for impaired skin integrity will decrease 01/19/2024 1927 by Evander Arley SQUIBB, RN Outcome: Progressing 01/19/2024 1926 by Evander Arley SQUIBB, RN Outcome: Progressing   Problem: Education: Goal: Required Educational Video(s) 01/19/2024 1927 by Evander Arley SQUIBB, RN Outcome: Progressing 01/19/2024 1926 by Evander Arley SQUIBB, RN Outcome: Progressing   Problem: Clinical Measurements: Goal: Postoperative complications will be avoided or minimized 01/19/2024 1927 by Evander Arley SQUIBB, RN Outcome: Progressing 01/19/2024 1926 by Evander Arley SQUIBB, RN Outcome: Progressing   Problem: Skin Integrity: Goal: Demonstration of wound healing without infection will improve 01/19/2024 1927 by Evander Arley SQUIBB, RN Outcome: Progressing 01/19/2024 1926 by Evander Arley SQUIBB, RN Outcome: Progressing   Problem: Delirium Goal: STG: Juan Goodwin's ability to perform activities of daily living will improve 01/19/2024 1927 by Evander Arley SQUIBB, RN Outcome: Progressing 01/19/2024 1926 by  Evander Arley SQUIBB, RN Outcome: Progressing Goal: STG: Juan Goodwin's ability to verbalize feelings about their condition will improve 01/19/2024 1927 by Evander Arley SQUIBB, RN Outcome: Progressing 01/19/2024 1926 by Evander Arley SQUIBB, RN Outcome: Progressing   Problem: Education: Goal: Knowledge of  disease or condition will improve 01/19/2024 1927 by Evander Arley SQUIBB, RN Outcome: Progressing 01/19/2024 1926 by Evander Arley SQUIBB, RN Outcome: Progressing Goal: Knowledge of secondary prevention will improve (MUST DOCUMENT ALL) 01/19/2024 1927 by Evander Arley SQUIBB, RN Outcome: Progressing 01/19/2024 1926 by Evander Arley SQUIBB, RN Outcome: Progressing Goal: Knowledge of patient specific risk factors will improve (DELETE if not current risk factor) 01/19/2024 1927 by Evander Arley SQUIBB, RN Outcome: Progressing 01/19/2024 1926 by Evander Arley SQUIBB, RN Outcome: Progressing   Problem: Ischemic Stroke/TIA Tissue Perfusion: Goal: Complications of ischemic stroke/TIA will be minimized 01/19/2024 1927 by Evander Arley SQUIBB, RN Outcome: Progressing 01/19/2024 1926 by Evander Arley SQUIBB, RN Outcome: Progressing   Problem: Coping: Goal: Will verbalize positive feelings about self 01/19/2024 1927 by Evander Arley SQUIBB, RN Outcome: Progressing 01/19/2024 1926 by Evander Arley SQUIBB, RN Outcome: Progressing Goal: Will identify appropriate support needs 01/19/2024 1927 by Evander Arley SQUIBB, RN Outcome: Progressing 01/19/2024 1926 by Evander Arley SQUIBB, RN Outcome: Progressing   Problem: Health Behavior/Discharge Planning: Goal: Ability to manage health-related needs will improve 01/19/2024 1927 by Evander Arley SQUIBB, RN Outcome: Progressing 01/19/2024 1926 by Evander Arley SQUIBB, RN Outcome: Progressing Goal: Goals will be collaboratively established with patient/family 01/19/2024 1927 by Evander Arley SQUIBB, RN Outcome: Progressing 01/19/2024 1926 by Evander Arley SQUIBB, RN Outcome: Progressing   Problem: Self-Care: Goal: Ability to  participate in self-care as condition permits will improve 01/19/2024 1927 by Evander Arley SQUIBB, RN Outcome: Progressing 01/19/2024 1926 by Evander Arley SQUIBB, RN Outcome: Progressing Goal: Verbalization of feelings and concerns over difficulty with self-care will improve 01/19/2024 1927 by Evander Arley SQUIBB, RN Outcome: Progressing 01/19/2024 1926 by Evander Arley SQUIBB, RN Outcome: Progressing Goal: Ability to communicate needs accurately will improve 01/19/2024 1927 by Evander Arley SQUIBB, RN Outcome: Progressing 01/19/2024 1926 by Evander Arley SQUIBB, RN Outcome: Progressing   Problem: Nutrition: Goal: Risk of aspiration will decrease 01/19/2024 1927 by Evander Arley SQUIBB, RN Outcome: Progressing 01/19/2024 1926 by Evander Arley SQUIBB, RN Outcome: Progressing Goal: Dietary intake will improve 01/19/2024 1927 by Evander Arley SQUIBB, RN Outcome: Progressing 01/19/2024 1926 by Evander Arley SQUIBB, RN Outcome: Progressing   Problem: Education: Goal: Verbalization of understanding the information provided (i.e., activity precautions, restrictions, etc) will improve 01/19/2024 1927 by Evander Arley SQUIBB, RN Outcome: Progressing 01/19/2024 1926 by Evander Arley SQUIBB, RN Outcome: Progressing   Problem: Activity: Goal: Ability to ambulate and perform ADLs will improve 01/19/2024 1927 by Evander Arley SQUIBB, RN Outcome: Progressing 01/19/2024 1926 by Evander Arley SQUIBB, RN Outcome: Progressing   Problem: Clinical Measurements: Goal: Postoperative complications will be avoided or minimized 01/19/2024 1927 by Evander Arley SQUIBB, RN Outcome: Progressing 01/19/2024 1926 by Evander Arley SQUIBB, RN Outcome: Progressing   Problem: Self-Concept: Goal: Ability to maintain and perform role responsibilities to the fullest extent possible will improve 01/19/2024 1927 by Evander Arley SQUIBB, RN Outcome: Progressing 01/19/2024 1926 by Evander Arley SQUIBB, RN Outcome: Progressing   Problem: Pain Management: Goal: Pain level will  decrease 01/19/2024 1927 by Evander Arley SQUIBB, RN Outcome: Progressing 01/19/2024 1926 by Evander Arley SQUIBB, RN Outcome: Progressing   Problem: Education: Goal: Ability to describe self-care measures that may prevent or decrease complications (Diabetes Survival Skills Education) will improve 01/19/2024 1927 by Evander Arley SQUIBB, RN Outcome: Progressing 01/19/2024 1926 by Evander Arley SQUIBB, RN Outcome: Progressing Goal: Individualized Educational Video(s) 01/19/2024 1927 by Evander Arley SQUIBB, RN Outcome: Progressing 01/19/2024 1926 by Evander Arley SQUIBB, RN Outcome: Progressing   Problem: Coping: Goal:  Ability to adjust to condition or change in health will improve 01/19/2024 1927 by Evander Arley SQUIBB, RN Outcome: Progressing 01/19/2024 1926 by Evander Arley SQUIBB, RN Outcome: Progressing   Problem: Fluid Volume: Goal: Ability to maintain a balanced intake and output will improve 01/19/2024 1927 by Evander Arley SQUIBB, RN Outcome: Progressing 01/19/2024 1926 by Evander Arley SQUIBB, RN Outcome: Progressing   Problem: Health Behavior/Discharge Planning: Goal: Ability to identify and utilize available resources and services will improve 01/19/2024 1927 by Evander Arley SQUIBB, RN Outcome: Progressing 01/19/2024 1926 by Evander Arley SQUIBB, RN Outcome: Progressing Goal: Ability to manage health-related needs will improve 01/19/2024 1927 by Evander Arley SQUIBB, RN Outcome: Progressing 01/19/2024 1926 by Evander Arley SQUIBB, RN Outcome: Progressing   Problem: Metabolic: Goal: Ability to maintain appropriate glucose levels will improve 01/19/2024 1927 by Evander Arley SQUIBB, RN Outcome: Progressing 01/19/2024 1926 by Evander Arley SQUIBB, RN Outcome: Progressing   Problem: Nutritional: Goal: Maintenance of adequate nutrition will improve Outcome: Progressing Goal: Progress toward achieving an optimal weight will improve Outcome: Progressing   Problem: Skin Integrity: Goal: Risk for impaired skin integrity will  decrease Outcome: Progressing   Problem: Tissue Perfusion: Goal: Adequacy of tissue perfusion will improve Outcome: Progressing

## 2024-01-19 NOTE — Plan of Care (Signed)
 Problem: Education: Goal: Knowledge of General Education information will improve Description: Including pain rating scale, medication(s)/side effects and non-pharmacologic comfort measures Outcome: Progressing   Problem: Health Behavior/Discharge Planning: Goal: Ability to manage health-related needs will improve Outcome: Progressing   Problem: Clinical Measurements: Goal: Ability to maintain clinical measurements within normal limits will improve Outcome: Progressing Goal: Will remain free from infection Outcome: Progressing Goal: Diagnostic test results will improve Outcome: Progressing Goal: Respiratory complications will improve Outcome: Progressing Goal: Cardiovascular complication will be avoided Outcome: Progressing   Problem: Activity: Goal: Risk for activity intolerance will decrease Outcome: Progressing   Problem: Nutrition: Goal: Adequate nutrition will be maintained Outcome: Progressing   Problem: Coping: Goal: Level of anxiety will decrease Outcome: Progressing   Problem: Elimination: Goal: Will not experience complications related to bowel motility Outcome: Progressing Goal: Will not experience complications related to urinary retention Outcome: Progressing   Problem: Pain Managment: Goal: General experience of comfort will improve and/or be controlled Outcome: Progressing   Problem: Safety: Goal: Ability to remain free from injury will improve Outcome: Progressing   Problem: Skin Integrity: Goal: Risk for impaired skin integrity will decrease Outcome: Progressing   Problem: Education: Goal: Required Educational Video(s) Outcome: Progressing   Problem: Clinical Measurements: Goal: Postoperative complications will be avoided or minimized Outcome: Progressing   Problem: Skin Integrity: Goal: Demonstration of wound healing without infection will improve Outcome: Progressing   Problem: Delirium Goal: STG: Wynn's ability to perform  activities of daily living will improve Outcome: Progressing Goal: STG: Mervin's ability to verbalize feelings about their condition will improve Outcome: Progressing   Problem: Education: Goal: Knowledge of disease or condition will improve Outcome: Progressing Goal: Knowledge of secondary prevention will improve (MUST DOCUMENT ALL) Outcome: Progressing Goal: Knowledge of patient specific risk factors will improve (DELETE if not current risk factor) Outcome: Progressing   Problem: Ischemic Stroke/TIA Tissue Perfusion: Goal: Complications of ischemic stroke/TIA will be minimized Outcome: Progressing   Problem: Coping: Goal: Will verbalize positive feelings about self Outcome: Progressing Goal: Will identify appropriate support needs Outcome: Progressing   Problem: Health Behavior/Discharge Planning: Goal: Ability to manage health-related needs will improve Outcome: Progressing Goal: Goals will be collaboratively established with patient/family Outcome: Progressing   Problem: Self-Care: Goal: Ability to participate in self-care as condition permits will improve Outcome: Progressing Goal: Verbalization of feelings and concerns over difficulty with self-care will improve Outcome: Progressing Goal: Ability to communicate needs accurately will improve Outcome: Progressing   Problem: Nutrition: Goal: Risk of aspiration will decrease Outcome: Progressing Goal: Dietary intake will improve Outcome: Progressing   Problem: Education: Goal: Verbalization of understanding the information provided (i.e., activity precautions, restrictions, etc) will improve Outcome: Progressing   Problem: Activity: Goal: Ability to ambulate and perform ADLs will improve Outcome: Progressing   Problem: Clinical Measurements: Goal: Postoperative complications will be avoided or minimized Outcome: Progressing   Problem: Self-Concept: Goal: Ability to maintain and perform role responsibilities to  the fullest extent possible will improve Outcome: Progressing   Problem: Pain Management: Goal: Pain level will decrease Outcome: Progressing   Problem: Education: Goal: Ability to describe self-care measures that may prevent or decrease complications (Diabetes Survival Skills Education) will improve Outcome: Progressing Goal: Individualized Educational Video(s) Outcome: Progressing   Problem: Coping: Goal: Ability to adjust to condition or change in health will improve Outcome: Progressing   Problem: Fluid Volume: Goal: Ability to maintain a balanced intake and output will improve Outcome: Progressing   Problem: Health Behavior/Discharge Planning: Goal: Ability to identify and  utilize available resources and services will improve Outcome: Progressing Goal: Ability to manage health-related needs will improve Outcome: Progressing   Problem: Metabolic: Goal: Ability to maintain appropriate glucose levels will improve Outcome: Progressing

## 2024-01-19 NOTE — Progress Notes (Signed)
 PROGRESS NOTE    Juan Goodwin  FMW:[id number] DOB: Jun 20, 1935 DOA: 01/13/2024 PCP: [name], MD   Brief Narrative: Juan Goodwin is a 88 y.o. male with a history of AAA, CAD, CKD stage IIIa, subdural hematoma, duodenal ulcer, CAD, hyperlipidemia, primary pretension.  Patient presented secondary to a fall and resultant right femoral neck fracture.  Orthopedic surgery was consulted and performed a right hip hemiarthroplasty on 11/3.  Hospitalization complicated by resultant metabolic encephalopathy of unknown etiology.   Assessment and Plan:  Right femoral neck fracture Secondary to fall. Orthopedic surgery consulted and performed a right hemiarthroplasty on 11/3.  Orthopedic surgery recommendation for weightbearing as tolerated regarding right lower extremity with posterior hip precautions in addition to aspirin  81 mg twice daily for DVT prophylaxis.  Acute metabolic encephalopathy Presumed diagnosis, although unclear etiology. Symptoms occurred post-surgery. CT head and MRI brain without evidence of acute intracranial process. EEG obtained and was negative for evidence of seizure activity. Some worsening renal function with BUN up to 29. TSH, Vitamin B12, folate, ammonia level do not explain symptoms. Mentation continues to improve.  AKI on CKD stage IIIa Baseline creatinine of about 1.3-1.4. This admission, creatinine up to a peak of 2.57. Worsened with initial IV fluids. Urine output quantity initially not documented. Blood pressure appears to have been adequate during surgery without evidence of significant hypotension. Renal ultrasound significant for evidence of medical renal disease and bladder suggestive of history of retention. Creatinine has reached peak and is trending down; BUN is following. -Strict in/out -BMP in AM  Dysphagia Secondary to mental status change.  Patient is seen and evaluated by speech therapy who recommends n.p.o. status.  Discussed with family and  decision made to have Cortrak feeding tube placed temporarily. Speech therapy reevaluated and patient's diet advanced to a dysphagia 3 diet. Oral intake is improving. -Cortrak NG tube -Dietitian recommendations (11/7): Transition to nocturnal tube feeding via Cortrak: Osmolite 1.5  at 60 ml/hr x 12 hours from 1800-0600 (720 ml per day) 100 ml FWF Q6H   Provides 1080 kcal, 45 gm protein, 548 ml free water daily (948 ml water daily TF + FWF) *Recommend continuing tube feeds until pt consistently eating >50% of his meals or meeting >60% of his nutritional needs by po.   Encourage PO intake: Nursing to assist with meals Room service with assist  Went over preordering melas with daughter at bedside Ensure Plus High Protein po BID, each supplement provides 350 kcal and 20 grams of protein Magic cup TID with meals, each supplement provides 290 kcal and 9 grams of protein 100 mg Thiamine x 7 days po MVI with minerals daily po 1,000 units vitamin D  daily po Monitor magnesium , potassium, and phosphorus daily for at least 3 days, MD to replete as needed, as pt is at risk for refeeding syndrome Calorie count -SLP recommendations (11/7): Diet recommendations: Dysphagia 3 (mechanical soft);Thin liquid Liquids provided via: Cup;Straw Medication Administration: Whole meds with liquid Supervision: Staff to assist with self feeding (set up assist)  Hyponatremia Mild. Resolved.  SIRS Patient with temperature of 100.6 F  and WBC of 11,000 on 11/8. Patient reports abdominal pain when having to use the bathroom but states this is with attempts at a bowel movement. Patient with dysphagia and risk for aspiration earlier this admission. -Check urinalysis -Trend fever and CBC -Chest x-ray -Blood cultures  Elevated LFTs Mild. Unclear etiology but improving.  History of CAD Noted.  Patient with prior NSTEMI.  Patient is  on metoprolol and Crestor  as an outpatient.  Currently on aspirin  for DVT  prophylaxis per orthopedic surgery.  Chronic anemia Likely anemia of chronic disease secondary to known CKD. Baseline hemoglobin of around 9-10. Continued to drift down post-operatively to a low of 7.3 and has stabilized. -Trend CBC  Primary hypertension Uncontrolled on admission.  History of abdominal aortic aneurysm Patient is status post recent infrarenal endovascular aortic repair with coil of the left internal iliac artery  Hypophosphatemia Possibly related to refeeding syndrome. -Sodium phosphate  IV -Recheck phosphorus and potassium this evening -Renal function panel in AM    DVT prophylaxis: Per orthopedic surgery: aspirin  81 mg BID Code Status:   Code Status: Full Code Family Communication: Daughter via telephone (11/8) Disposition Plan: Likely to skilled nursing facility pending improved oral intake, fever workup   Consultants:  Orthopedic surgery Neurology  Procedures:  Right hip hemiarthroplasty  Antimicrobials: Vancomycin  Cefazolin    Subjective: Patient reports some abdominal pain when trying to have a bowel movement. No other concerns today.  Objective: BP 136/80 (BP Location: Left Arm)   Pulse 81   Temp 98.4 F (36.9 C) (Oral)   Resp 20   Ht 6' 1 (1.854 m)   Wt 71.5 kg   SpO2 100%   BMI 20.80 kg/m   Examination:  General exam: Appears calm and comfortable. Respiratory system: Clear to auscultation. Respiratory effort normal. Cardiovascular system: S1 & S2 heard, RRR.  Gastrointestinal system: Abdomen is nondistended, soft and nontender. Normal bowel sounds heard. Central nervous system: Alert and oriented. No focal neurological deficits. Musculoskeletal: No edema. No calf tenderness   Data Reviewed: I have personally reviewed following labs and imaging studies  CBC Lab Results  Component Value Date   WBC 11.0 (H) 01/19/2024   RBC 3.32 (L) 01/19/2024   HGB 8.5 (L) 01/19/2024   HCT 26.0 (L) 01/19/2024   MCV 78.3 (L) 01/19/2024   MCH  25.6 (L) 01/19/2024   PLT 363 01/19/2024   MCHC 32.7 01/19/2024   RDW 17.0 (H) 01/19/2024   LYMPHSABS 1.4 12/07/2023   MONOABS 0.3 12/07/2023   EOSABS 0.1 12/07/2023   BASOSABS 0.0 12/07/2023     Last metabolic panel Lab Results  Component Value Date   NA 138 01/19/2024   K 3.7 01/19/2024   CL 101 01/19/2024   CO2 28 01/19/2024   BUN 36 (H) 01/19/2024   CREATININE 1.56 (H) 01/19/2024   GLUCOSE 155 (H) 01/19/2024   GFRNONAA 43 (L) 01/19/2024   GFRAA 46 (L) 06/08/2018   CALCIUM  8.2 (L) 01/19/2024   PHOS <1.0 (LL) 01/19/2024   PROT 6.0 (L) 01/17/2024   ALBUMIN  2.0 (L) 01/17/2024   LABGLOB 4.2 (H) 11/06/2023   BILITOT 0.6 01/17/2024   ALKPHOS 81 01/17/2024   AST 50 (H) 01/17/2024   ALT 10 01/17/2024   ANIONGAP 9 01/19/2024    GFR: Estimated Creatinine Clearance: 34.4 mL/min (A) (by C-G formula based on SCr of 1.56 mg/dL (H)).  Recent Results (from the past 240 hours)  Surgical pcr screen     Status: None   Collection Time: 01/14/24  4:47 AM   Specimen: Nasal Mucosa; Nasal Swab  Result Value Ref Range Status   MRSA, PCR NEGATIVE NEGATIVE Final   Staphylococcus aureus NEGATIVE NEGATIVE Final    Comment: (NOTE) The Xpert SA Assay (FDA approved for NASAL specimens in patients 33 years of age and older), is one component of a comprehensive surveillance program. It is not intended to diagnose infection nor to guide  or monitor treatment. Performed at [facility], [address] [place], [place] [address]       Radiology Studies: No results found.     LOS: 6 days    Elgin Lam, MD [facility] 01/19/2024, 7:18 AM   If 7PM-7AM, please contact night-coverage [email or web address]

## 2024-01-20 DIAGNOSIS — W19XXXA Unspecified fall, initial encounter: Secondary | ICD-10-CM | POA: Diagnosis not present

## 2024-01-20 DIAGNOSIS — G928 Other toxic encephalopathy: Secondary | ICD-10-CM | POA: Diagnosis not present

## 2024-01-20 DIAGNOSIS — S72001A Fracture of unspecified part of neck of right femur, initial encounter for closed fracture: Secondary | ICD-10-CM | POA: Diagnosis not present

## 2024-01-20 DIAGNOSIS — E43 Unspecified severe protein-calorie malnutrition: Secondary | ICD-10-CM | POA: Diagnosis not present

## 2024-01-20 LAB — HEPATIC FUNCTION PANEL
ALT: 173 U/L — ABNORMAL HIGH (ref 0–44)
AST: 453 U/L — ABNORMAL HIGH (ref 15–41)
Albumin: 1.7 g/dL — ABNORMAL LOW (ref 3.5–5.0)
Alkaline Phosphatase: 101 U/L (ref 38–126)
Bilirubin, Direct: 0.1 mg/dL (ref 0.0–0.2)
Indirect Bilirubin: 0.1 mg/dL — ABNORMAL LOW (ref 0.3–0.9)
Total Bilirubin: 0.2 mg/dL (ref 0.0–1.2)
Total Protein: 4.5 g/dL — ABNORMAL LOW (ref 6.5–8.1)

## 2024-01-20 LAB — RENAL FUNCTION PANEL
Albumin: 1.7 g/dL — ABNORMAL LOW (ref 3.5–5.0)
Anion gap: 7 (ref 5–15)
BUN: 32 mg/dL — ABNORMAL HIGH (ref 8–23)
CO2: 28 mmol/L (ref 22–32)
Calcium: 8.1 mg/dL — ABNORMAL LOW (ref 8.9–10.3)
Chloride: 100 mmol/L (ref 98–111)
Creatinine, Ser: 1.48 mg/dL — ABNORMAL HIGH (ref 0.61–1.24)
GFR, Estimated: 46 mL/min — ABNORMAL LOW (ref >60)
Glucose, Bld: 205 mg/dL — ABNORMAL HIGH (ref 70–99)
Phosphorus: 1.6 mg/dL — ABNORMAL LOW (ref 2.5–4.6)
Potassium: 3.9 mmol/L (ref 3.5–5.1)
Sodium: 135 mmol/L (ref 135–145)

## 2024-01-20 LAB — CBC
HCT: 24.2 % — ABNORMAL LOW (ref 39.0–52.0)
Hemoglobin: 8 g/dL — ABNORMAL LOW (ref 13.0–17.0)
MCH: 25.6 pg — ABNORMAL LOW (ref 26.0–34.0)
MCHC: 33.1 g/dL (ref 30.0–36.0)
MCV: 77.6 fL — ABNORMAL LOW (ref 80.0–100.0)
Platelets: 336 K/uL (ref 150–400)
RBC: 3.12 MIL/uL — ABNORMAL LOW (ref 4.22–5.81)
RDW: 17.2 % — ABNORMAL HIGH (ref 11.5–15.5)
WBC: 8.4 K/uL (ref 4.0–10.5)
nRBC: 0 % (ref 0.0–0.2)

## 2024-01-20 LAB — GLUCOSE, CAPILLARY
Glucose-Capillary: 105 mg/dL — ABNORMAL HIGH (ref 70–99)
Glucose-Capillary: 109 mg/dL — ABNORMAL HIGH (ref 70–99)
Glucose-Capillary: 149 mg/dL — ABNORMAL HIGH (ref 70–99)
Glucose-Capillary: 161 mg/dL — ABNORMAL HIGH (ref 70–99)
Glucose-Capillary: 185 mg/dL — ABNORMAL HIGH (ref 70–99)
Glucose-Capillary: 241 mg/dL — ABNORMAL HIGH (ref 70–99)

## 2024-01-20 LAB — MAGNESIUM: Magnesium: 1.7 mg/dL (ref 1.7–2.4)

## 2024-01-20 MED ORDER — POTASSIUM PHOSPHATES 15 MMOLE/5ML IV SOLN
30.0000 mmol | Freq: Once | INTRAVENOUS | Status: AC
  Administered 2024-01-20: 30 mmol via INTRAVENOUS
  Filled 2024-01-20: qty 10

## 2024-01-20 NOTE — Plan of Care (Signed)
 Problem: Education: Goal: Knowledge of General Education information will improve Description: Including pain rating scale, medication(s)/side effects and non-pharmacologic comfort measures Outcome: Progressing   Problem: Health Behavior/Discharge Planning: Goal: Ability to manage health-related needs will improve Outcome: Progressing   Problem: Clinical Measurements: Goal: Ability to maintain clinical measurements within normal limits will improve Outcome: Progressing Goal: Will remain free from infection Outcome: Progressing Goal: Diagnostic test results will improve Outcome: Progressing Goal: Respiratory complications will improve Outcome: Progressing Goal: Cardiovascular complication will be avoided Outcome: Progressing   Problem: Activity: Goal: Risk for activity intolerance will decrease Outcome: Progressing   Problem: Nutrition: Goal: Adequate nutrition will be maintained Outcome: Progressing   Problem: Coping: Goal: Level of anxiety will decrease Outcome: Progressing   Problem: Elimination: Goal: Will not experience complications related to bowel motility Outcome: Progressing Goal: Will not experience complications related to urinary retention Outcome: Progressing   Problem: Pain Managment: Goal: General experience of comfort will improve and/or be controlled Outcome: Progressing   Problem: Safety: Goal: Ability to remain free from injury will improve Outcome: Progressing   Problem: Skin Integrity: Goal: Risk for impaired skin integrity will decrease Outcome: Progressing   Problem: Education: Goal: Required Educational Video(s) Outcome: Progressing   Problem: Clinical Measurements: Goal: Postoperative complications will be avoided or minimized Outcome: Progressing   Problem: Skin Integrity: Goal: Demonstration of wound healing without infection will improve Outcome: Progressing   Problem: Delirium Goal: STG: Wynn's ability to perform  activities of daily living will improve Outcome: Progressing Goal: STG: Mervin's ability to verbalize feelings about their condition will improve Outcome: Progressing   Problem: Education: Goal: Knowledge of disease or condition will improve Outcome: Progressing Goal: Knowledge of secondary prevention will improve (MUST DOCUMENT ALL) Outcome: Progressing Goal: Knowledge of patient specific risk factors will improve (DELETE if not current risk factor) Outcome: Progressing   Problem: Ischemic Stroke/TIA Tissue Perfusion: Goal: Complications of ischemic stroke/TIA will be minimized Outcome: Progressing   Problem: Coping: Goal: Will verbalize positive feelings about self Outcome: Progressing Goal: Will identify appropriate support needs Outcome: Progressing   Problem: Health Behavior/Discharge Planning: Goal: Ability to manage health-related needs will improve Outcome: Progressing Goal: Goals will be collaboratively established with patient/family Outcome: Progressing   Problem: Self-Care: Goal: Ability to participate in self-care as condition permits will improve Outcome: Progressing Goal: Verbalization of feelings and concerns over difficulty with self-care will improve Outcome: Progressing Goal: Ability to communicate needs accurately will improve Outcome: Progressing   Problem: Nutrition: Goal: Risk of aspiration will decrease Outcome: Progressing Goal: Dietary intake will improve Outcome: Progressing   Problem: Education: Goal: Verbalization of understanding the information provided (i.e., activity precautions, restrictions, etc) will improve Outcome: Progressing   Problem: Activity: Goal: Ability to ambulate and perform ADLs will improve Outcome: Progressing   Problem: Clinical Measurements: Goal: Postoperative complications will be avoided or minimized Outcome: Progressing   Problem: Self-Concept: Goal: Ability to maintain and perform role responsibilities to  the fullest extent possible will improve Outcome: Progressing   Problem: Pain Management: Goal: Pain level will decrease Outcome: Progressing   Problem: Education: Goal: Ability to describe self-care measures that may prevent or decrease complications (Diabetes Survival Skills Education) will improve Outcome: Progressing Goal: Individualized Educational Video(s) Outcome: Progressing   Problem: Coping: Goal: Ability to adjust to condition or change in health will improve Outcome: Progressing   Problem: Fluid Volume: Goal: Ability to maintain a balanced intake and output will improve Outcome: Progressing   Problem: Health Behavior/Discharge Planning: Goal: Ability to identify and  utilize available resources and services will improve Outcome: Progressing Goal: Ability to manage health-related needs will improve Outcome: Progressing   Problem: Metabolic: Goal: Ability to maintain appropriate glucose levels will improve Outcome: Progressing

## 2024-01-20 NOTE — Progress Notes (Signed)
 Pt has order for abd ultrasound. Received message from RT to keep NPO and stop tube feeds for imaging. [facility] NP notified. Nocturnal tube feeds stopped at 2100.

## 2024-01-20 NOTE — Progress Notes (Signed)
 PROGRESS NOTE    Juan Goodwin  FMW:[id number] DOB: 1935-10-01 DOA: 01/13/2024 PCP: [name], MD   Brief Narrative:  Juan Goodwin is a 88 y.o. male with a history of AAA, CAD, CKD stage IIIa, subdural hematoma, duodenal ulcer, CAD, hyperlipidemia, primary pretension.  Patient presented secondary to a fall and resultant right femoral neck fracture.  Orthopedic surgery was consulted and performed a right hip hemiarthroplasty on 11/3.  Hospitalization complicated by resultant metabolic encephalopathy of unknown etiology. Now had a temperature overnight and LFTs worsening.   Assessment and Plan:  Right femoral neck fracture: Secondary to fall. Orthopedic surgery consulted and performed a right hemiarthroplasty on 11/3.  Orthopedic surgery recommendation for weightbearing as tolerated regarding right lower extremity with posterior hip precautions in addition to aspirin  81 mg twice daily for DVT prophylaxis. C/w Bowel Regimen and Pain Control.    Acute metabolic encephalopathy: Presumed diagnosis, although unclear etiology. Symptoms occurred post-surgery. CT head and MRI brain without evidence of acute intracranial process. EEG obtained and was negative for evidence of seizure activity. Some worsening renal function with BUN up to 29. TSH, Vitamin B12, folate, ammonia level do not explain symptoms. Mentation continues to improve slowly. Delirium Precautions    Dysphagia Secondary to mental status change.  Patient is seen and evaluated by speech therapy who recommends n.p.o. status.  Discussed with family and decision made to have Cortrak feeding tube placed temporarily. Speech therapy reevaluated and patient's diet advanced to a dysphagia 3 diet. Oral intake is improving. -Cortrak NG tube -Dietitian recommendations (11/7): Transition to nocturnal tube feeding via Cortrak: Osmolite 1.5  at 60 ml/hr x 12 hours from 1800-0600 (720 ml per day) 100 ml FWF Q6H   Provides 1080 kcal, 45 gm  protein, 548 ml free water daily (948 ml water daily TF + FWF) *Recommend continuing tube feeds until pt consistently eating >50% of his meals or meeting >60% of his nutritional needs by po.   Encourage PO intake: Nursing to assist with meals Room service with assist  Went over preordering melas with daughter at bedside Ensure Plus High Protein po BID, each supplement provides 350 kcal and 20 grams of protein Magic cup TID with meals, each supplement provides 290 kcal and 9 grams of protein 100 mg Thiamine x 7 days po MVI with minerals daily po 1,000 units vitamin D  daily po Monitor magnesium , potassium, and phosphorus daily for at least 3 days, MD to replete as needed, as pt is at risk for refeeding syndrome Calorie count -SLP recommendations (11/7): Diet recommendations: Dysphagia 3 (mechanical soft);Thin liquid Liquids provided via: Cup;Straw Medication Administration: Whole meds with liquid Supervision: Staff to assist with self feeding (set up assist)   Hyponatremia: Mild and Resolved. Na+ Trend:  Recent Labs  Lab 01/15/24 0251 01/16/24 0300 01/17/24 0231 01/17/24 1207 01/18/24 0221 01/19/24 0548 01/20/24 0419  NA 135 138 138 139 139 138 135  -CTM and Trend and repeat CMP in the AM   Elevated LFTs: Initially mild but now worsening. Unclear etiology. AST and ALT Trend:  Recent Labs  Lab 01/01/24 1100 01/13/24 1954 01/15/24 0251 01/17/24 1207 01/20/24 0419  AST 27 103* 88* 50* 453*  ALT 15 76* 69* 10 173*  -Check RUQ U/S and Acute Hepatitis Panel in the AM  History of CAD: Noted.  Patient with prior NSTEMI.  Patient is on Metoprolol Succinate 25 mg po Daily, Isosorbide  Mononitrate 30 mg po Daily; Holding Rosuvastatin  given abnormal LFTs  Currently on ASA  81 mg po BID for DVT prophylaxis per orthopedic surgery.   Essential Hypertension: Uncontrolled on admission C/w Metoprolol Succinate 25 mg po Daily and Isosorbide  Mononitrate 30 mg po Daily. C/w IV Hydralazine  10 mg  q4hprn HBP and SBP >160. CTM BP per Protcol. Last BP reading was 143/84   History of abdominal aortic aneurysm: Patient is status post recent infrarenal endovascular aortic repair with coil of the left internal iliac artery; Will need outpt follow up w/ Vascular Surgery.  AKI on CKD Stage 3a: Baseline creatinine of about 1.3-1.4. This admission, creatinine up to a peak of 2.57. BUN/Cr Trend: Recent Labs  Lab 01/15/24 0251 01/16/24 0300 01/17/24 0231 01/17/24 1207 01/18/24 0221 01/19/24 0548 01/20/24 0419  BUN 21 29* 36* 37* 40* 36* 32*  CREATININE 1.86* 2.57* 2.21* 2.22* 1.91* 1.56* 1.48*  -Worsened with initial IV fluids. Urine output quantity not documented. Blood pressure appears to have been adequate during surgery without evidence of significant hypotension. -Renal ultrasound significant for evidence of medical renal disease and bladder suggestive of history of retention. Creatinine has reached peak and is trending down. -C/w Strict I's and O's and CTM UOP. Avoid Nephrotoxic Medications, Contrast Dyes, Hypotension and Dehydration to Ensure Adequate Renal Perfusion and will need to Renally Adjust Meds. CTM & Trend Renal Function carefully & repeat CMP in the AM   Fever: Unclear Etiology; Tmax was 100.6 overnight. Blood Cx x2 showing NGTD <24 hours; DG Chest showed no active Disease. U/A Negative. CTM and Trend and Follow Cx's  Hypophosphatemia: Phos Lvl was 1.6. Replete w/ IV K Phos 30 mmol. CTM and Replete as Necessary. Repeat Phos in the AM   Chronic Microcytic Anemia: Likely anemia of chronic disease secondary to known CKD. Baseline hemoglobin of around 9-10. Continued to drift down post-operatively to a low of 7.3 and has stabilized. Hgb/Hct Trend:  Recent Labs  Lab 01/14/24 0404 01/15/24 0251 01/16/24 0300 01/17/24 0231 01/18/24 0221 01/19/24 0548 01/20/24 0419  HGB 9.6* 9.7* 8.3* 7.3* 7.7* 8.5* 8.0*  HCT 29.1* 29.8* 25.2* 22.2* 23.6* 26.0* 24.2*  MCV 77.8* 77.8* 77.1*  77.4* 78.7* 78.3* 77.6*  -Anemia panel done and showed an iron of 26, UIBC 106, TIBC 132, saturation 55%, ferritin 597; FOBT is Negative -CTM for S/Sx of Bleeding; No overt bleeding noted. Repeat CBC in the AM  Hypoalbuminemia: Patient's Albumin  Lvl Trend: Recent Labs  Lab 01/01/24 1100 01/13/24 1954 01/15/24 0251 01/17/24 1207 01/20/24 0419  ALBUMIN  3.2* 2.3* 2.4* 2.0* 1.7*  1.7*  -CTM & Trend & repeat CMP in the AM  Severe Malnutrition in the Context of Chronic illness: Nutrition Status: Nutrition Problem: Severe Malnutrition Etiology: chronic illness Signs/Symptoms: severe muscle depletion, severe fat depletion Interventions: Refer to RD note for recommendations   DVT prophylaxis: SCDs Start: 01/13/24 1538    Code Status: Full Code Family Communication: No family present @ bedside   Disposition Plan:  Level of care: Progressive Status is: Inpatient Remains inpatient appropriate because: Needs further clinical improvement a   Consultants:  Neurology Orthopedic Surgery  Procedures:  As delineated as above  Antimicrobials:  Anti-infectives (From admission, onward)    Start     Dose/Rate Route Frequency Ordered Stop   01/15/24 0600  ceFAZolin  (ANCEF ) IVPB 2g/100 mL premix        2 g 200 mL/hr over 30 Minutes Intravenous Every 6 hours 01/15/24 0252 01/15/24 1828   01/14/24 1842  vancomycin  (VANCOCIN ) powder  Status:  Discontinued  As needed 01/14/24 1843 01/14/24 2059   01/14/24 0600  ceFAZolin  (ANCEF ) IVPB 2g/100 mL premix        2 g 200 mL/hr over 30 Minutes Intravenous On call to O.R. 01/13/24 1527 01/14/24 1743       Subjective: Seen and examined at bedside and was resting.  Had no new complaints.  Still little confused.  No nausea or vomiting.  Core track in place.  Phos remains low.  No other concerns or complaint this time.  Objective: Vitals:   01/20/24 0300 01/20/24 0752 01/20/24 1157 01/20/24 1614  BP: (!) 153/80 138/70 133/77 (!) 143/84   Pulse: 81 80 78 81  Resp: 20 14 16  (!) 21  Temp: 98.4 F (36.9 C) 99.4 F (37.4 C) (!) 97.4 F (36.3 C) (!) 97.3 F (36.3 C)  TempSrc: Oral Axillary Oral Axillary  SpO2: 100% 100% 100% 100%  Weight:      Height:        Intake/Output Summary (Last 24 hours) at 01/20/2024 1938 Last data filed at 01/20/2024 1836 Gross per 24 hour  Intake 890.28 ml  Output 1500 ml  Net -609.72 ml   Filed Weights   01/17/24 0451 01/18/24 0409 01/19/24 0500  Weight: 69.4 kg 69.4 kg 71.5 kg   Examination: Physical Exam:  Constitutional: Thin elderly chronically ill-appearing African-American male who is resting and is a little confused Respiratory: Diminished to auscultation bilaterally, no wheezing, rales, rhonchi or crackles. Normal respiratory effort and patient is not tachypenic. No accessory muscle use.  Unlabored breathing Cardiovascular: RRR, no murmurs / rubs / gallops. S1 and S2 auscultated.  Mild extremity edema Abdomen: Soft, non-tender, non-distended.  Bowel sounds positive.  GU: Deferred. Musculoskeletal: No clubbing / cyanosis of digits/nails. No joint deformity upper and lower extremities.  Skin: No rashes, lesions, ulcers on limited skin evaluation. No induration; Warm and dry.  Neurologic: Cranial nerves II through XII grossly intact. Psychiatric: Impaired judgment insight and is a little confused.  Data Reviewed: I have personally reviewed following labs and imaging studies  CBC: Recent Labs  Lab 01/16/24 0300 01/17/24 0231 01/18/24 0221 01/19/24 0548 01/20/24 0419  WBC 7.4 6.2 6.7 11.0* 8.4  HGB 8.3* 7.3* 7.7* 8.5* 8.0*  HCT 25.2* 22.2* 23.6* 26.0* 24.2*  MCV 77.1* 77.4* 78.7* 78.3* 77.6*  PLT 277 276 313 363 336   Basic Metabolic Panel: Recent Labs  Lab 01/13/24 1954 01/14/24 0404 01/17/24 0231 01/17/24 1207 01/18/24 0221 01/19/24 0548 01/19/24 1647 01/20/24 0419  NA  --    < > 138 139 139 138  --  135  K  --    < > 3.7 3.5 3.8 3.7 3.9 3.9  CL  --    < >  103 106 104 101  --  100  CO2  --    < > 23 24 26 28   --  28  GLUCOSE  --    < > 159* 252* 121* 155*  --  205*  BUN  --    < > 36* 37* 40* 36*  --  32*  CREATININE  --    < > 2.21* 2.22* 1.91* 1.56*  --  1.48*  CALCIUM   --    < > 7.9* 8.2* 8.2* 8.2*  --  8.1*  MG 2.2  --  2.3  --  2.1 1.9  --  1.7  PHOS  --   --  3.8  --  1.3* <1.0* 2.5 1.6*   < > = values  in this interval not displayed.   GFR: Estimated Creatinine Clearance: 36.2 mL/min (A) (by C-G formula based on SCr of 1.48 mg/dL (H)). Liver Function Tests: Recent Labs  Lab 01/13/24 1954 01/15/24 0251 01/17/24 1207 01/20/24 0419  AST 103* 88* 50* 453*  ALT 76* 69* 10 173*  ALKPHOS 91 90 81 101  BILITOT 0.9 0.5 0.6 0.2  PROT 6.7 6.8 6.0* 4.5*  ALBUMIN  2.3* 2.4* 2.0* 1.7*  1.7*   No results for input(s): LIPASE, AMYLASE in the last 168 hours. Recent Labs  Lab 01/16/24 0926  AMMONIA 13   Coagulation Profile: No results for input(s): INR, PROTIME in the last 168 hours. Cardiac Enzymes: No results for input(s): CKTOTAL, CKMB, CKMBINDEX, TROPONINI in the last 168 hours. BNP (last 3 results) No results for input(s): PROBNP in the last 8760 hours. HbA1C: No results for input(s): HGBA1C in the last 72 hours. CBG: Recent Labs  Lab 01/19/24 2347 01/20/24 0306 01/20/24 0751 01/20/24 1156 01/20/24 1613  GLUCAP 184* 185* 241* 161* 109*   Lipid Profile: No results for input(s): CHOL, HDL, LDLCALC, TRIG, CHOLHDL, LDLDIRECT in the last 72 hours. Thyroid  Function Tests: No results for input(s): TSH, T4TOTAL, FREET4, T3FREE, THYROIDAB in the last 72 hours. Anemia Panel: No results for input(s): VITAMINB12, FOLATE, FERRITIN, TIBC, IRON, RETICCTPCT in the last 72 hours. Sepsis Labs: No results for input(s): PROCALCITON, LATICACIDVEN in the last 168 hours.  Recent Results (from the past 240 hours)  Surgical pcr screen     Status: None   Collection Time: 01/14/24   4:47 AM   Specimen: Nasal Mucosa; Nasal Swab  Result Value Ref Range Status   MRSA, PCR NEGATIVE NEGATIVE Final   Staphylococcus aureus NEGATIVE NEGATIVE Final    Comment: (NOTE) The Xpert SA Assay (FDA approved for NASAL specimens in patients 36 years of age and older), is one component of a comprehensive surveillance program. It is not intended to diagnose infection nor to guide or monitor treatment. Performed at [facility], [address] [place], [place] [address]   Culture, blood (Routine X 2) w Reflex to ID Panel     Status: None (Preliminary result)   Collection Time: 01/19/24  9:58 AM   Specimen: BLOOD RIGHT ARM  Result Value Ref Range Status   Specimen Description BLOOD RIGHT ARM  Final   Special Requests   Final    BOTTLES DRAWN AEROBIC AND ANAEROBIC Blood Culture results may not be optimal due to an inadequate volume of blood received in culture bottles   Culture   Final    NO GROWTH < 24 HOURS Performed at [facility], [address] [place], [place] [address]    Report Status PENDING  Incomplete  Culture, blood (Routine X 2) w Reflex to ID Panel     Status: None (Preliminary result)   Collection Time: 01/19/24 10:17 AM   Specimen: BLOOD RIGHT HAND  Result Value Ref Range Status   Specimen Description BLOOD RIGHT HAND  Final   Special Requests   Final    BOTTLES DRAWN AEROBIC AND ANAEROBIC Blood Culture results may not be optimal due to an inadequate volume of blood received in culture bottles   Culture   Final    NO GROWTH < 24 HOURS Performed at [facility], [address] [place], [place] [address]    Report Status PENDING  Incomplete    Radiology Studies: DG CHEST PORT 1 VIEW Result Date: 01/19/2024 EXAM: 1 VIEW(S)  XRAY OF THE CHEST 01/19/2024 10:30:00 AM COMPARISON: 01/16/2024 CLINICAL HISTORY: SIRS (systemic inflammatory response syndrome) (HCC) FINDINGS: LINES, TUBES AND DEVICES: Feeding tube extends beyond the inferior aspect of the  film. Multiple wires and leads project over the chest on the frontal radiograph. LUNGS AND PLEURA: Interstitial coarsening, likely related to smoking / chronic bronchitis. No pulmonary edema. No pleural effusion. No pneumothorax. HEART AND MEDIASTINUM: Mild cardiomegaly. No acute abnormality of the mediastinal silhouette. BONES AND SOFT TISSUES: No acute osseous abnormality. IMPRESSION: 1. No acute findings. Electronically signed by: Rockey Kilts MD 01/19/2024 03:19 PM EST RP Workstation: [id number]   Scheduled Meds:  aspirin   81 mg Per Tube BID   Or   aspirin   300 mg Rectal BID   cholecalciferol  1,000 Units Oral Daily   feeding supplement  237 mL Oral BID BM   feeding supplement (OSMOLITE 1.5 CAL)  720 mL Per Tube Q24H   free water  100 mL Per Tube Q6H   Influenza vac split trivalent PF  0.5 mL Intramuscular Tomorrow-1000   insulin aspart  0-9 Units Subcutaneous Q4H   isosorbide  mononitrate  30 mg Oral Daily   metoprolol succinate  25 mg Oral Daily   multivitamin with minerals  1 tablet Oral Daily   pneumococcal 20-valent conjugate vaccine  0.5 mL Intramuscular Tomorrow-1000   thiamine  100 mg Oral Daily   Continuous Infusions:   LOS: 7 days   Alejandro Marker, DO [facility] Available via Epic secure chat 7am-7pm After these hours, please refer to coverage provider listed on [email or web address] 01/20/2024, 7:38 PM

## 2024-01-21 ENCOUNTER — Inpatient Hospital Stay (HOSPITAL_COMMUNITY)

## 2024-01-21 DIAGNOSIS — R7989 Other specified abnormal findings of blood chemistry: Secondary | ICD-10-CM

## 2024-01-21 DIAGNOSIS — S72001A Fracture of unspecified part of neck of right femur, initial encounter for closed fracture: Secondary | ICD-10-CM | POA: Diagnosis not present

## 2024-01-21 DIAGNOSIS — E43 Unspecified severe protein-calorie malnutrition: Secondary | ICD-10-CM | POA: Diagnosis not present

## 2024-01-21 DIAGNOSIS — W19XXXA Unspecified fall, initial encounter: Secondary | ICD-10-CM | POA: Diagnosis not present

## 2024-01-21 DIAGNOSIS — G928 Other toxic encephalopathy: Secondary | ICD-10-CM | POA: Diagnosis not present

## 2024-01-21 LAB — CBC WITH DIFFERENTIAL/PLATELET
Abs Immature Granulocytes: 0.03 K/uL (ref 0.00–0.07)
Basophils Absolute: 0.1 K/uL (ref 0.0–0.1)
Basophils Relative: 1 %
Eosinophils Absolute: 0.3 K/uL (ref 0.0–0.5)
Eosinophils Relative: 5 %
HCT: 25.3 % — ABNORMAL LOW (ref 39.0–52.0)
Hemoglobin: 8.3 g/dL — ABNORMAL LOW (ref 13.0–17.0)
Immature Granulocytes: 1 %
Lymphocytes Relative: 21 %
Lymphs Abs: 1.4 K/uL (ref 0.7–4.0)
MCH: 25.8 pg — ABNORMAL LOW (ref 26.0–34.0)
MCHC: 32.8 g/dL (ref 30.0–36.0)
MCV: 78.6 fL — ABNORMAL LOW (ref 80.0–100.0)
Monocytes Absolute: 0.7 K/uL (ref 0.1–1.0)
Monocytes Relative: 10 %
Neutro Abs: 4.1 K/uL (ref 1.7–7.7)
Neutrophils Relative %: 62 %
Platelets: 357 K/uL (ref 150–400)
RBC: 3.22 MIL/uL — ABNORMAL LOW (ref 4.22–5.81)
RDW: 17.3 % — ABNORMAL HIGH (ref 11.5–15.5)
WBC: 6.5 K/uL (ref 4.0–10.5)
nRBC: 0 % (ref 0.0–0.2)

## 2024-01-21 LAB — COMPREHENSIVE METABOLIC PANEL WITH GFR
ALT: 393 U/L — ABNORMAL HIGH (ref 0–44)
AST: 931 U/L — ABNORMAL HIGH (ref 15–41)
Albumin: 1.7 g/dL — ABNORMAL LOW (ref 3.5–5.0)
Alkaline Phosphatase: 105 U/L (ref 38–126)
Anion gap: 8 (ref 5–15)
BUN: 28 mg/dL — ABNORMAL HIGH (ref 8–23)
CO2: 27 mmol/L (ref 22–32)
Calcium: 8.5 mg/dL — ABNORMAL LOW (ref 8.9–10.3)
Chloride: 101 mmol/L (ref 98–111)
Creatinine, Ser: 1.39 mg/dL — ABNORMAL HIGH (ref 0.61–1.24)
GFR, Estimated: 49 mL/min — ABNORMAL LOW (ref >60)
Glucose, Bld: 106 mg/dL — ABNORMAL HIGH (ref 70–99)
Potassium: 4.7 mmol/L (ref 3.5–5.1)
Sodium: 136 mmol/L (ref 135–145)
Total Bilirubin: 0.6 mg/dL (ref 0.0–1.2)
Total Protein: 5.7 g/dL — ABNORMAL LOW (ref 6.5–8.1)

## 2024-01-21 LAB — GLUCOSE, CAPILLARY
Glucose-Capillary: 104 mg/dL — ABNORMAL HIGH (ref 70–99)
Glucose-Capillary: 98 mg/dL (ref 70–99)
Glucose-Capillary: 170 mg/dL — ABNORMAL HIGH (ref 70–99)
Glucose-Capillary: 192 mg/dL — ABNORMAL HIGH (ref 70–99)
Glucose-Capillary: 153 mg/dL — ABNORMAL HIGH (ref 70–99)
Glucose-Capillary: 178 mg/dL — ABNORMAL HIGH (ref 70–99)

## 2024-01-21 LAB — MAGNESIUM: Magnesium: 1.7 mg/dL (ref 1.7–2.4)

## 2024-01-21 LAB — HEPATITIS PANEL, ACUTE
HCV Ab: NONREACTIVE
Hep A IgM: NONREACTIVE
Hep B C IgM: NONREACTIVE
Hepatitis B Surface Ag: NONREACTIVE

## 2024-01-21 LAB — PROTIME-INR
INR: 1.2 (ref 0.8–1.2)
Prothrombin Time: 15.9 s — ABNORMAL HIGH (ref 11.4–15.2)

## 2024-01-21 LAB — AMMONIA: Ammonia: 31 umol/L (ref 9–35)

## 2024-01-21 LAB — PHOSPHORUS: Phosphorus: 2.9 mg/dL (ref 2.5–4.6)

## 2024-01-21 MED ORDER — PHENOL 1.4 % MT LIQD
1.0000 | OROMUCOSAL | Status: DC | PRN
  Administered 2024-01-21: 1 via OROMUCOSAL
  Filled 2024-01-21: qty 177

## 2024-01-21 MED ORDER — MENTHOL 3 MG MT LOZG
1.0000 | LOZENGE | OROMUCOSAL | Status: DC | PRN
  Filled 2024-01-21: qty 9

## 2024-01-21 NOTE — Progress Notes (Signed)
 Calorie Count Note   48-hour calorie count ordered.   Diet: Dysphagia 3, thin liquids Supplements: Ensure Plus HP BID, Magic Cup TID   Estimated Nutritional Needs:  Kcal: 1800-2000 Protein: 90-110 g Fluid: >1.8 L   Day 1: 11/9 Breakfast: 0% Lunch: 0% Dinner: 50% tilapia, 50% green beans, 100% iced tea, 5% cream of potato soup (101 Kcals, 11 g protein) Supplements: 0%   Day 1 Total Intake: 101 Kcal (6% of minimum estimated needs)  11 g protein (12% of minimum estimated needs) ________________________________________  Day 2: 11/10 Breakfast: 20% scrambled eggs (26 Kcals, 2 g protein)  Lunch: 20% mac and cheese (40 Kcals, 2 g protein) Dinner: N/A Supplements: 0%   Day 2 Total Intake: 66 Kcal (4% of minimum estimated needs)  4 g protein (4% of minimum estimated needs)      Nutrition Diagnosis: Severe Malnutrition related to chronic illness as evidenced by severe muscle depletion, severe fat depletion. - Ongoing    Goal: Patient will meet greater than or equal to 90% of their needs - Progressing; being met with TF at this time and not PO   Intervention: Discontinue Calorie Count. Futile at this juncture due to minimal PO intake. Continue nocturnal tube feeding via Cortrak of Osmolite 1.5 at 60 mL/hr X 12 hours from 1800-0600 (720 mL daily.) Continue 100 mL free water flushes every 6 hours. This enteral nutrition regimen provides 1080 Kcals, 45 g protein and 948 mL free water daily. Continue tube feeds until patient consistently eating >50% of meals and/or meeting >60% of his nutrition needs via PO. Continue Dysphagia 3 diet with thin liquids. Advance per SLP. Continue Ensure Plus High Protein PO BID. Each supplement provides 350 Kcals and 20 grams of protein. Continue Magic Cup TID. Each supplement provides 290 Kcals and 9 grams of protein. Continue daily multivitamin PO. Continue 100 mg thiamine X 7 days PO (started 11/8.) Continue Vitamin D3 1000 units daily  PO. Replace electrolytes per protocol.    Leverne Ruth, MS, RDN, LDN [place]. [facility] See AMION for contact information

## 2024-01-21 NOTE — TOC Progression Note (Signed)
 Transition of Care [facility]) - Progression Note    Patient Details  Name: Juan Goodwin MRN: [id number] Date of Birth: 08-31-1935  [facility] of [facility] CM/SW Contact  Montie LOISE Louder, [place] Phone Number: 01/21/2024, 10:49 AM  Clinical Narrative:     TOC continues to follow for medical readiness. Recommendation for SNF. Will send out referrals once cortrak is removed.   Montie Louder, MSW, LCSW Clinical Social Worker                      Expected Discharge Plan and Services                                               Social Drivers of Health (SDOH) Interventions SDOH Screenings   Food Insecurity: No Food Insecurity (01/13/2024)  Housing: Low Risk  (01/13/2024)  Transportation Needs: No Transportation Needs (01/13/2024)  Utilities: Not At Risk (01/13/2024)  Depression (PHQ2-9): Low Risk  (12/07/2023)  Physical Activity: Inactive (11/08/2021)  Social Connections: Unknown (01/13/2024)  Stress: No Stress Concern Present (11/08/2021)  Tobacco Use: Medium Risk (01/14/2024)    Readmission Risk Interventions     No data to display

## 2024-01-21 NOTE — Consult Note (Signed)
 [facility] Consult  Referring Provider: [facility] hospitalist/Dr. Sherrill Primary Care Physician:  [name], MD Primary Gastroenterologist: Dr. Dianna  Reason for Consultation: Abnormal LFTs  HPI: Juan Goodwin is a 88 y.o. male who was admitted on 01/13/2024 after a fall and a right femoral neck fracture.  He underwent right hip hemiarthroplasty on 01/14/2024, was found to be encephalopathic, and his LFTs have been worsening.  History is obtained from the patient as well as his daughter present at bedside. He does not have history of significant alcohol use. There is no recent changes in medications. Denies sick contacts, recent travel, history of IV drug abuse.  Denies recent change in appetite, change in bowel habits, blood in stool or black stools.  Seen by Leonette Rad, PA in 11/2023 iron deficiency anemia and unintentional weight loss of 50 pounds in 9 months, underwent a CAT scan without contrast which showed 7 cm abdominal aortic aneurysm, was referred to vascular surgery, enlarged prostate with bladder outlet obstruction, was referred to urology and colonic diverticulosis.  Previous GI workup: Colonoscopy, 2016, iron deficiency anemia, Dr. Dianna: Diverticulosis, internal hemorrhoids, repeat colonoscopy not recommended due to age EGD, 2016, Dr. Dianna, melena: Pyloric channel ulcer treated with epinephrine  injection  Past Medical History:  Diagnosis Date   AAA (abdominal aortic aneurysm)    Acute pyloric channel ulcer 07/13/2014   Acute upper GI bleed 07/13/2014   Aortic atherosclerosis    Ascending aorta dilatation    4.3cm by echo 09/2022 and 3.9cm by Chest CT 03/2023.   Blood transfusion without reported diagnosis    CAD (coronary artery disease)    Cerebrovascular disease 10/08/2018   Chronic kidney disease, stage 3a (HCC) 03/30/2017   Dilated aortic root    38 mm by echo with 04/2021   Gastrointestinal hemorrhage with melena 06/2014   Global  aphasia 03/27/2017   Helicobacter pylori gastritis 07/13/2014   High cholesterol    Hypernatremia 07/13/2014   Hypertension    Hypokalemia 07/13/2014   Myocardial infarction [facility])    mild heart attack in 2022   Normocytic normochromic anemia 04/10/2017   REM sleep behavior disorder 10/08/2018   Subdural hematoma (HCC) 03/26/2017   Thrombocytopenia 07/13/2014    Past Surgical History:  Procedure Laterality Date   ABDOMINAL AORTIC ENDOVASCULAR STENT GRAFT Bilateral 01/04/2024   Procedure: INSERTION, ENDOVASCULAR STENT GRAFT, AORTA, ABDOMINAL; COIL EMBOLIZATION OF LEFT INTERNAL ILIAC ARTERY ANEURYSM;  Surgeon: Lanis Fonda BRAVO, MD;  Location: [facility] OR;  Service: Vascular;  Laterality: Bilateral;   BRAIN SURGERY     CRANIOTOMY Left 03/26/2017   Procedure: CRANIOTOMY HEMATOMA EVACUATION SUBDURAL;  Surgeon: Onetha Kuba, MD;  Location: [facility] OR;  Service: Neurosurgery;  Laterality: Left;   ESOPHAGOGASTRODUODENOSCOPY N/A 07/11/2014   Procedure: ESOPHAGOGASTRODUODENOSCOPY (EGD);  Surgeon: Jerrell Dianna, MD;  Location: [facility];  Service: Endoscopy;  Laterality: N/A;   HIP ARTHROPLASTY Right 01/14/2024   Procedure: HEMIARTHROPLASTY (BIPOLAR) HIP, POSTERIOR APPROACH FOR FRACTURE;  Surgeon: Georgina Ozell LABOR, MD;  Location: [facility] OR;  Service: Orthopedics;  Laterality: Right;   LEFT HEART CATH AND CORONARY ANGIOGRAPHY N/A 04/26/2020   Procedure: LEFT HEART CATH AND CORONARY ANGIOGRAPHY;  Surgeon: Dann Candyce RAMAN, MD;  Location: [facility] INVASIVE CV LAB;  Service: Cardiovascular;  Laterality: N/A;   ULTRASOUND GUIDANCE FOR VASCULAR ACCESS Bilateral 01/04/2024   Procedure: ULTRASOUND GUIDANCE, FOR VASCULAR ACCESS;  Surgeon: Lanis Fonda BRAVO, MD;  Location: [facility] OR;  Service: Vascular;  Laterality: Bilateral;    Prior to Admission medications   Medication  Sig Start Date End Date Taking? Authorizing Provider  acetaminophen  (TYLENOL ) 500 MG tablet Take 1,000 mg by mouth every 6 (six) hours as needed for  headache or fever (pain).   Yes [provider]  isosorbide  mononitrate (IMDUR ) 30 MG 24 hr tablet TAKE 1 TABLET(30 MG) BY MOUTH DAILY 07/07/22  Yes Turner, Wilbert SAUNDERS, MD  metoprolol succinate (TOPROL XL) 25 MG 24 hr tablet Take 1 tablet (25 mg total) by mouth daily. 12/11/23  Yes Vannie Reche RAMAN, NP  Multiple Vitamins-Minerals (MULTIVITAMIN MEN 50+) TABS Take 1 tablet by mouth daily.   Yes [provider]  nitroGLYCERIN  (NITROSTAT ) 0.4 MG SL tablet Place 1 tablet (0.4 mg total) under the tongue every 5 (five) minutes as needed for chest pain. 09/11/22  Yes Turner, Wilbert SAUNDERS, MD  rosuvastatin  (CRESTOR ) 40 MG tablet Take 1 tablet (40 mg total) by mouth daily. Patient taking differently: Take 40 mg by mouth every evening. 05/10/20  Yes Turner, Wilbert SAUNDERS, MD  senna (SENOKOT) 8.6 MG TABS tablet Take 1 tablet (8.6 mg total) by mouth in the morning and at bedtime for 14 days. 01/18/24 02/01/24 Yes Georgina Ozell LABOR, MD  traMADol (ULTRAM) 50 MG tablet Take 1 tablet (50 mg total) by mouth every 6 (six) hours as needed for moderate pain (pain score 4-6). 01/05/24  Yes Bethanie Cough, PA-C  aspirin  EC (ASPIRIN  LOW DOSE) 81 MG tablet Take 1 tablet (81 mg total) by mouth 2 (two) times daily. 01/18/24 02/27/24  Georgina Ozell LABOR, MD  HYDROcodone-acetaminophen  (NORCO/VICODIN) 5-325 MG tablet Take 1 tablet by mouth every 4 (four) hours as needed for up to 7 days for severe pain (pain score 7-10) (MILD PAIN). 01/18/24 01/25/24  Georgina Ozell LABOR, MD  polyethylene glycol (MIRALAX / GLYCOLAX) 17 g packet Take 17 g by mouth daily for 14 days. 01/18/24 02/01/24  Georgina Ozell LABOR, MD    Current Facility-Administered Medications  Medication Dose Route Frequency Provider Last Rate Last Admin   aspirin  chewable tablet 81 mg  81 mg Per Tube BID Tanda Powell ORN, RPH   81 mg at 01/21/24 9187   Or   aspirin  suppository 300 mg  300 mg Rectal BID Tanda Powell ORN, RPH       bisacodyl (DULCOLAX) EC tablet 5 mg  5 mg Oral  Daily PRN Moore, Michael A, MD       cholecalciferol (VITAMIN D3) 25 MCG (1000 UNIT) tablet 1,000 Units  1,000 Units Oral Daily Briana Elgin LABOR, MD   1,000 Units at 01/21/24 9187   feeding supplement (ENSURE PLUS HIGH PROTEIN) liquid 237 mL  237 mL Oral BID BM Briana Elgin LABOR, MD   237 mL at 01/21/24 0816   feeding supplement (OSMOLITE 1.5 CAL) liquid 720 mL  720 mL Per Tube Q24H Briana Elgin LABOR, MD   Infusion Verify at 01/20/24 1836   free water 100 mL  100 mL Per Tube Q6H Briana Elgin LABOR, MD   100 mL at 01/20/24 1830   hydrALAZINE  (APRESOLINE ) injection 10 mg  10 mg Intravenous Q4H PRN Moore, Michael A, MD   10 mg at 01/16/24 1357   HYDROcodone-acetaminophen  (NORCO/VICODIN) 5-325 MG per tablet 1-2 tablet  1-2 tablet Oral Q4H PRN Moore, Michael A, MD   2 tablet at 01/16/24 1700   Influenza vac split trivalent PF (FLUZONE HIGH-DOSE) injection 0.5 mL  0.5 mL Intramuscular Tomorrow-1000 Tobie Modest V, MD       insulin aspart (novoLOG) injection 0-9 Units  0-9 Units  Subcutaneous Q4H Briana Elgin LABOR, MD   1 Units at 01/20/24 2349   isosorbide  mononitrate (IMDUR ) 24 hr tablet 30 mg  30 mg Oral Daily Moore, Michael A, MD   30 mg at 01/21/24 9187   metoprolol succinate (TOPROL-XL) 24 hr tablet 25 mg  25 mg Oral Daily Moore, Michael A, MD   25 mg at 01/21/24 9187   multivitamin with minerals tablet 1 tablet  1 tablet Oral Daily Briana Elgin LABOR, MD   1 tablet at 01/21/24 9187   pneumococcal 20-valent conjugate vaccine (PREVNAR 20) injection 0.5 mL  0.5 mL Intramuscular Tomorrow-1000 Patel, Ekta V, MD       polyethylene glycol (MIRALAX / GLYCOLAX) packet 17 g  17 g Oral Daily PRN Moore, Michael A, MD   17 g at 01/17/24 1444   thiamine (VITAMIN B1) tablet 100 mg  100 mg Oral Daily Briana Elgin LABOR, MD   100 mg at 01/21/24 9187    Allergies as of 01/13/2024 - Review Complete 01/13/2024  Allergen Reaction Noted   Doxazosin mesylate Other (See Comments) 02/29/2020    Family History  Problem Relation Age of  Onset   Heart failure Mother     Social History   Socioeconomic History   Marital status: Widowed    Spouse name: Not on file   Number of children: 3   Years of education: 14   Highest education level: Not on file  Occupational History   Not on file  Tobacco Use   Smoking status: Former    Current packs/day: 1.00    Average packs/day: 1 pack/day for 35.0 years (35.0 ttl pk-yrs)    Types: Cigarettes   Smokeless tobacco: Never  Vaping Use   Vaping status: Never Used  Substance and Sexual Activity   Alcohol use: No   Drug use: Not Currently   Sexual activity: Never  Other Topics Concern   Not on file  Social History Narrative   Right handed    Occasional cup of coffee    Lives at home alone   Wife is deceased along with 2 children 1 child is living     Social Drivers of Corporate Investment Banker Strain: Not on file  Food Insecurity: No Food Insecurity (01/13/2024)   Hunger Vital Sign    Worried About Running Out of Food in the Last Year: Never true    Ran Out of Food in the Last Year: Never true  Transportation Needs: No Transportation Needs (01/13/2024)   PRAPARE - Administrator, Civil Service (Medical): No    Lack of Transportation (Non-Medical): No  Physical Activity: Inactive (11/08/2021)   Exercise Vital Sign    Days of Exercise per Week: 0 days    Minutes of Exercise per Session: 0 min  Stress: No Stress Concern Present (11/08/2021)   [employer] of Occupational Health - Occupational Stress Questionnaire    Feeling of Stress : Only a little  Social Connections: Unknown (01/13/2024)   Social Connection and Isolation Panel    Frequency of Communication with Friends and Family: More than three times a week    Frequency of Social Gatherings with Friends and Family: More than three times a week    Attends Religious Services: More than 4 times per year    Active Member of [employer] or Organizations: Yes    Attends Engineer, Structural: More  than 4 times per year    Marital Status: Patient declined  Catering Manager  Violence: Not At Risk (01/13/2024)   Humiliation, Afraid, Rape, and Kick questionnaire    Fear of Current or Ex-Partner: No    Emotionally Abused: No    Physically Abused: No    Sexually Abused: No    Review of Systems: As per HPI  Physical Exam: Vital signs in last 24 hours: Temp:  [97.3 F (36.3 C)-98.7 F (37.1 C)] 98 F (36.7 C) (11/10 0814) Pulse Rate:  [76-88] 88 (11/10 0814) Resp:  [13-24] 17 (11/10 0814) BP: (133-151)/(77-84) 151/79 (11/10 0814) SpO2:  [100 %] 100 % (11/10 0814) Weight:  [70.5 kg] 70.5 kg (11/10 0500) Last BM Date : 01/20/24  General:   Alert,  Well-developed, well-nourished, pleasant and cooperative in NAD Head:  Normocephalic and atraumatic. Eyes:  Sclera clear, no icterus.   Conjunctiva pink. Ears:  Normal auditory acuity. Nose:  No deformity, discharge,  or lesions. Mouth:  No deformity or lesions.  Oropharynx pink & moist. Neck:  Supple; no masses or thyromegaly. Lungs:  Clear throughout to auscultation.   No wheezes, crackles, or rhonchi. No acute distress. Heart:  Regular rate and rhythm; no murmurs, clicks, rubs,  or gallops. Extremities:  Without clubbing or edema. Neurologic:  Alert and  oriented x4;  grossly normal neurologically. Skin:  Intact without significant lesions or rashes. Psych:  Alert and cooperative. Normal mood and affect. Abdomen:  Soft, nontender and nondistended. No masses, hepatosplenomegaly or hernias noted. Normal bowel sounds, without guarding, and without rebound.         Lab Results: Recent Labs    01/19/24 0548 01/20/24 0419 01/21/24 0540  WBC 11.0* 8.4 6.5  HGB 8.5* 8.0* 8.3*  HCT 26.0* 24.2* 25.3*  PLT 363 336 357   BMET Recent Labs    01/19/24 0548 01/19/24 1647 01/20/24 0419 01/21/24 0540  NA 138  --  135 136  K 3.7 3.9 3.9 4.7  CL 101  --  100 101  CO2 28  --  28 27  GLUCOSE 155*  --  205* 106*  BUN 36*  --  32*  28*  CREATININE 1.56*  --  1.48* 1.39*  CALCIUM  8.2*  --  8.1* 8.5*   LFT Recent Labs    01/20/24 0419 01/21/24 0540  PROT 4.5* 5.7*  ALBUMIN  1.7*  1.7* 1.7*  AST 453* 931*  ALT 173* 393*  ALKPHOS 101 105  BILITOT 0.2 0.6  BILIDIR 0.1  --   IBILI 0.1*  --    PT/INR Recent Labs    01/21/24 1013  LABPROT 15.9*  INR 1.2    Studies/Results: US  Abdomen Limited RUQ (LIVER/GB) Result Date: 01/21/2024 CLINICAL DATA:  Elevated LFTs. EXAM: ULTRASOUND ABDOMEN LIMITED RIGHT UPPER QUADRANT COMPARISON:  CT scan 11/27/2023. FINDINGS: Gallbladder: No gallstones or wall thickening visualized. No sonographic Murphy sign noted by sonographer. Common bile duct: Diameter: 5 mm Liver: Liver parenchyma is echogenic. Subtle nodular appearance of the liver contour (see images 34 and 31) raises the question of underlying cirrhosis. Portal vein is patent on color Doppler imaging with normal direction of blood flow towards the liver. Other: None. IMPRESSION: 1. Echogenic liver parenchyma with subtle nodular appearance of the liver contour raising the question of underlying cirrhosis. 2. No evidence for cholelithiasis or biliary dilatation. Electronically Signed   By: Camellia Candle M.D.   On: 01/21/2024 06:41    Impression: Abnormal LFTs: Hepatocellular dysfunction, not cholestatic T. bili 0.6, AST 931, ALT 393, ALP 105 AST increased from 50 to 453 to 931  ALT increased from 10 to 173 to 393   Ultrasound: Nodular echogenic liver, possible cirrhosis without gallstones or biliary dilatation MELD Na 13  Ferritin 597, iron saturation 20%, hemochromatosis less likely HBsAg, HCV antibody, HAV antibody unremarkable  Right femoral neck fracture status post right hemiarthroplasty  Acute metabolic encephalopathy, currently alert awake oriented x 3  Dysphagia due to change in mental status, currently tolerating dysphagia level 3 diet with nocturnal feeds via Cortrack to maintain calorie  requirement  Comorbidities: CAD, hypertension, abdominal aortic aneurysm, acute on chronic kidney disease  Plan: Possible ischemic hepatitis Blood pressure 97/50 mmHg at 10 AM today  Will continue to monitor LFTs, for completion of workup will send labs for ASMA, AMA, alpha 1 antitrypsin, ceruloplasmin.  Patient likely has underlying cirrhosis, although LFT abnormality reflects more hepatocellular dysfunction.  Will follow in a.m..    LOS: 8 days   Estelita Manas, MD  01/21/2024, 11:34 AM

## 2024-01-21 NOTE — Progress Notes (Signed)
 Physical Therapy Treatment Patient Details Name: Juan Goodwin MRN: [id number] DOB: September 24, 1935 Today's Date: 01/21/2024   History of Present Illness 88 yo male presents to ED 11/2 with fall x1 week ago, subsequent R hip pain and + head trauma. Pt sustained R femoral neck fx s/p hemiarthroplasty (WBAT, posterior hip precautions) 11/3. Code stroke called post-op, CTH and CTA negative for acute findings, MRI negative. EEG negative. PMH includes AAA  s/p recent infrarenal endovascular aortic repair with coil of the left internal iliac artery on 01/04/2024, CAD, CKD, hypertension, MI, SDH, duodenal ulcer.    PT Comments  Patient unable to recall any of his hip precautions at beginning of session (recalled limited flexion at end of session). Required physical assist and verbal cues to adhere to precautions during transfers. Sit to stand x3 from EOB due to bowel incontinence and pericare. Pt fatigued and able to step around from bed to chair with min assist (+2 for safety). Will continue to benefit from PT including post-acute inpatient therapies <3 hrs/day.     If plan is discharge home, recommend the following: Two people to help with walking and/or transfers;Two people to help with bathing/dressing/bathroom;Assistance with feeding;Assistance with cooking/housework;Direct supervision/assist for medications management;Direct supervision/assist for financial management;Assist for transportation   Can travel by private vehicle     No  Equipment Recommendations  Rolling walker (2 wheels);Wheelchair (measurements PT);Wheelchair cushion (measurements PT)    Recommendations for Other Services       Precautions / Restrictions Precautions Precautions: Fall;Posterior Hip Precaution Booklet Issued: Yes (comment) Recall of Precautions/Restrictions: Impaired Precaution/Restrictions Comments: pt recalled no R posterior hip precautions at beginning of session; recalled one during and at end of  session Restrictions Weight Bearing Restrictions Per Provider Order: Yes RLE Weight Bearing Per Provider Order: Weight bearing as tolerated     Mobility  Bed Mobility Overal bed mobility: Needs Assistance Bed Mobility: Supine to Sit     Supine to sit: Mod assist, +2 for safety/equipment     General bed mobility comments: assist for RLE and scooting hips forward, cueing for technique and safety/precautions;    Transfers Overall transfer level: Needs assistance Equipment used: Rolling walker (2 wheels) Transfers: Sit to/from Stand, Bed to chair/wheelchair/BSC Sit to Stand: Min assist, +2 safety/equipment, From elevated surface   Step pivot transfers: Min assist, +2 safety/equipment       General transfer comment: vc and assist to place Rt foot forward prior to each transfer (to stand and to sit); min assist to power up and steady initally, with cueing for technique for precuations.Stood x 3 from EOB due to bowel incontinence and cleaning    Ambulation/Gait               General Gait Details: pt fatigued after prolonged standing for pericare and needed to sit and rest.   Stairs             Wheelchair Mobility     Tilt Bed    Modified Rankin (Stroke Patients Only)       Balance Overall balance assessment: Needs assistance Sitting-balance support: No upper extremity supported, Feet supported Sitting balance-Leahy Scale: Fair Sitting balance - Comments: close supervision statically   Standing balance support: Bilateral upper extremity supported, Reliant on assistive device for balance Standing balance-Leahy Scale: Poor Standing balance comment: min to contact guard with BUE support  Communication Communication Communication: Impaired Factors Affecting Communication: Hearing impaired  Cognition Arousal: Alert Behavior During Therapy: Flat affect                           PT - Cognition Comments: Rt  gaze preference, slow to process Following commands: Impaired Following commands impaired: Follows one step commands with increased time, Follows multi-step commands inconsistently    Cueing Cueing Techniques: Verbal cues, Tactile cues  Exercises General Exercises - Lower Extremity Heel Slides: AAROM, Right, 5 reps    General Comments        Pertinent Vitals/Pain Pain Assessment Pain Assessment: Faces Faces Pain Scale: Hurts a little bit Pain Location: R hip Pain Descriptors / Indicators: Sore Pain Intervention(s): Limited activity within patient's tolerance, Monitored during session    Home Living                          Prior Function            PT Goals (current goals can now be found in the care plan section) Acute Rehab PT Goals Patient Stated Goal: to improve mobility quality Time For Goal Achievement: 01/30/24 Potential to Achieve Goals: Fair Progress towards PT goals: Progressing toward goals    Frequency    Min 2X/week      PT Plan      Co-evaluation              AM-PAC PT 6 Clicks Mobility   Outcome Measure  Help needed turning from your back to your side while in a flat bed without using bedrails?: A Lot Help needed moving from lying on your back to sitting on the side of a flat bed without using bedrails?: Total Help needed moving to and from a bed to a chair (including a wheelchair)?: A Lot Help needed standing up from a chair using your arms (e.g., wheelchair or bedside chair)?: A Lot Help needed to walk in hospital room?: Total Help needed climbing 3-5 steps with a railing? : Total 6 Click Score: 9    End of Session Equipment Utilized During Treatment: Gait belt Activity Tolerance: Patient limited by fatigue Patient left: in chair;with call bell/phone within reach;with chair alarm set;with family/visitor present Nurse Communication: Mobility status;Precautions PT Visit Diagnosis: Other abnormalities of gait and mobility  (R26.89);History of falling (Z91.81);Muscle weakness (generalized) (M62.81);Difficulty in walking, not elsewhere classified (R26.2)     Time: 8664-8597 PT Time Calculation (min) (ACUTE ONLY): 27 min  Charges:    $Gait Training: 23-37 mins PT General Charges $$ ACUTE PT VISIT: 1 Visit                      Macario RAMAN, PT Acute Rehabilitation Services  Office [phone]    Macario SHAUNNA Soja 01/21/2024, 2:12 PM

## 2024-01-21 NOTE — Progress Notes (Signed)
 PROGRESS NOTE    Juan Goodwin  FMW:[id number] DOB: 07-Jul-1935 DOA: 01/13/2024 PCP: [name], MD   Brief Narrative:  Juan Goodwin is a 88 y.o. male with a history of AAA, CAD, CKD stage IIIa, subdural hematoma, duodenal ulcer, CAD, hyperlipidemia, primary pretension.  Patient presented secondary to a fall and resultant right femoral neck fracture.  Orthopedic surgery was consulted and performed a right hip hemiarthroplasty on 11/3.  Hospitalization complicated by resultant metabolic encephalopathy of unknown etiology. Now had a temperature the night before last and LFTs worsening and so now GI consulted for further evaluation.   Assessment and Plan:  Right Femoral Neck Fracture: Secondary to fall. Orthopedic surgery consulted and performed a right hemiarthroplasty on 11/3.  Orthopedic surgery recommendation for weightbearing as tolerated regarding right lower extremity with posterior hip precautions in addition to aspirin  81 mg twice daily for DVT prophylaxis. C/w Bowel Regimen and Pain Control.    Acute Metabolic Encephalopathy: Presumed diagnosis, although unclear etiology. Symptoms occurred post-surgery. CT head and MRI brain without evidence of acute intracranial process. EEG obtained and was negative for evidence of seizure activity. Some worsening renal function with BUN up to 29. TSH, Vitamin B12, folate, ammonia level do not explain symptoms (Ammonia Lvl was 31). Mentation continues to improve slowly. Delirium Precautions    Dysphagia and poor po intake: Secondary to mental status change.  Patient was seen and evaluated by speech therapy who recommended n.p.o. status but now on D3 after SLP re-evaluation.  Dr. Briana Discussed with family and decision made to have Cortrak feeding tube placed temporarily. Oral intake is improving but still not enough to maintain oral caloric intake as he failed Calorie Count. -Cortrak NG tube -Dietitian recommendations (11/7): Transition to  nocturnal tube feeding via Cortrak: Osmolite 1.5  at 60 ml/hr x 12 hours from 1800-0600 (720 ml per day) 100 ml FWF Q6H   Provides 1080 kcal, 45 gm protein, 548 ml free water daily (948 ml water daily TF + FWF) *Recommend continuing tube feeds until pt consistently eating >50% of his meals or meeting >60% of his nutritional needs by po.   Encourage PO intake: Nursing to assist with meals Room service with assist  Went over preordering melas with daughter at bedside Ensure Plus High Protein po BID, each supplement provides 350 kcal and 20 grams of protein Magic cup TID with meals, each supplement provides 290 kcal and 9 grams of protein 100 mg Thiamine x 7 days po MVI with minerals daily po 1,000 units vitamin D  daily po Monitor magnesium , potassium, and phosphorus daily for at least 3 days, MD to replete as needed, as pt is at risk for refeeding syndrome Calorie count -SLP recommendations (11/7): Diet recommendations: Dysphagia 3 (mechanical soft);Thin liquid Liquids provided via: Cup;Straw Medication Administration: Whole meds with liquid Supervision: Staff to assist with self feeding (set up assist)   Hyponatremia: Mild and Resolved. Na+ Trend:  Recent Labs  Lab 01/16/24 0300 01/17/24 0231 01/17/24 1207 01/18/24 0221 01/19/24 0548 01/20/24 0419 01/21/24 0540  NA 138 138 139 139 138 135 136  -CTM and Trend and repeat CMP in the AM   Abnormal/Elevated LFTs in the setting of ? Liver Cirrhosis: Initially mild but now worsening significantly. Unclear etiology. AST and ALT Trend:  Recent Labs  Lab 01/01/24 1100 01/13/24 1954 01/15/24 0251 01/17/24 1207 01/20/24 0419 01/21/24 0540  AST 27 103* 88* 50* 453* 931*  ALT 15 76* 69* 10 173* 393*  -Checked RUQ U/S  and it showed Echogenic liver parenchyma with subtle nodular appearance of the liver contour raising the question of underlying cirrhosis and  No evidence for cholelithiasis or biliary dilatation.  Acute Hepatitis Panel  Non-Reactive.  -Ammonia Lvl normal at 31. GI consulted for further evaluation and they are recommending continue to monitor LFTs and complete workup with ASMA, AMA, alpha-1 antitrypsin and ceruloplasmin; they feel it may be possibly Ischemic Hepatitis  History of CAD: Noted.  Patient with prior NSTEMI.  Patient is on Metoprolol Succinate 25 mg po Daily, Isosorbide  Mononitrate 30 mg po Daily; Holding Rosuvastatin  given abnormal LFTs. Currently on ASA 81 mg po BID for DVT prophylaxis per Orthopedic Surgery.   Essential Hypertension: Uncontrolled on admission C/w Metoprolol Succinate 25 mg po Daily and Isosorbide  Mononitrate 30 mg po Daily. C/w IV Hydralazine  10 mg q4hprn HBP and SBP >160. CTM BP per Protcol. Last BP reading was on the softer side at 104/65   History of Abdominal Aortic Aneurysm: Patient is status post recent infrarenal endovascular aortic repair with coil of the left internal iliac artery; Will need outpt follow up w/ Vascular Surgery.  AKI on CKD Stage 3a: Baseline Cr of about 1.3-1.4. This admission, creatinine up to a peak of 2.57 but now at baseline. BUN/Cr Trend: Recent Labs  Lab 01/16/24 0300 01/17/24 0231 01/17/24 1207 01/18/24 0221 01/19/24 0548 01/20/24 0419 01/21/24 0540  BUN 29* 36* 37* 40* 36* 32* 28*  CREATININE 2.57* 2.21* 2.22* 1.91* 1.56* 1.48* 1.39*  -Worsened with initial IV fluids. Urine output quantity not documented. Blood pressure appears to have been adequate during surgery without evidence of significant hypotension. -Renal ultrasound significant for evidence of medical renal disease and bladder suggestive of history of retention. Creatinine has reached peak and is trending down. -C/w Strict I's and O's and CTM UOP. Avoid Nephrotoxic Medications, Contrast Dyes, Hypotension and Dehydration to Ensure Adequate Renal Perfusion and will need to Renally Adjust Meds. CTM & Trend Renal Function carefully & repeat CMP in the AM   Fever: Unclear Etiology; Tmax was  100.6 the night before last. Blood Cx x2 showing NGTD @ 2 Days; DG Chest showed no active Disease. U/A Negative. CTM and Trend and Follow Cx's  Hypophosphatemia: Phos Lvl was 1.6 and improved to 2.9. CTM and Replete as Necessary. Repeat Phos in the AM   Chronic Microcytic Anemia: Likely anemia of chronic disease secondary to known CKD. Baseline hemoglobin of around 9-10. Continued to drift down post-operatively to a low of 7.3 and has stabilized. Hgb/Hct Trend:  Recent Labs  Lab 01/15/24 0251 01/16/24 0300 01/17/24 0231 01/18/24 0221 01/19/24 0548 01/20/24 0419 01/21/24 0540  HGB 9.7* 8.3* 7.3* 7.7* 8.5* 8.0* 8.3*  HCT 29.8* 25.2* 22.2* 23.6* 26.0* 24.2* 25.3*  MCV 77.8* 77.1* 77.4* 78.7* 78.3* 77.6* 78.6*  -Anemia panel done and showed an iron of 26, UIBC 106, TIBC 132, saturation 55%, ferritin 597; FOBT is Negative -CTM for S/Sx of Bleeding; No overt bleeding noted. Repeat CBC in the AM  Hypoalbuminemia: Patient's Albumin  Lvl Trend: Recent Labs  Lab 01/01/24 1100 01/13/24 1954 01/15/24 0251 01/17/24 1207 01/20/24 0419 01/21/24 0540  ALBUMIN  3.2* 2.3* 2.4* 2.0* 1.7*  1.7* 1.7*  -CTM & Trend & repeat CMP in the AM  Severe Malnutrition in the Context of Chronic illness: Nutrition Status: Nutrition Problem: Severe Malnutrition Etiology: chronic illness Signs/Symptoms: severe muscle depletion, severe fat depletion Interventions: Refer to RD note for recommendations; Calorie count was done and patient failed but 90% of his needs  are being met by TF (Nocturnal TF via Cortrak of Osmolite 1.5 @ 60 mL/hr x 12 hours and 100 mL of FFW q6h)   DVT prophylaxis: SCDs Start: 01/13/24 1538    Code Status: Full Code Family Communication: No family present @ bedside   Disposition Plan:  Level of care: Progressive Status is: Inpatient Remains inpatient appropriate because: There is further clinical improvement and clearance by specialists   Consultants:   Gastroenterology Neurology Orthopedic Surgery  Procedures:  As delineated as above  Antimicrobials:  Anti-infectives (From admission, onward)    Start     Dose/Rate Route Frequency Ordered Stop   01/15/24 0600  ceFAZolin  (ANCEF ) IVPB 2g/100 mL premix        2 g 200 mL/hr over 30 Minutes Intravenous Every 6 hours 01/15/24 0252 01/15/24 1828   01/14/24 1842  vancomycin  (VANCOCIN ) powder  Status:  Discontinued          As needed 01/14/24 1843 01/14/24 2059   01/14/24 0600  ceFAZolin  (ANCEF ) IVPB 2g/100 mL premix        2 g 200 mL/hr over 30 Minutes Intravenous On call to O.R. 01/13/24 1527 01/14/24 1743       Subjective: Seen and examined at bedside and was resting in doing okay.  Cortrak remains in place.  No nausea or vomiting.  Still not taking very much oral intake.  LFTs are markedly worse so GI has been consulted.  No other concerns or complaints at this time.  Objective: Vitals:   01/21/24 0814 01/21/24 1152 01/21/24 1600 01/21/24 1700  BP: (!) 151/79 136/62 98/64 104/65  Pulse: 88 83 77 85  Resp: 17 18 12 13   Temp: 98 F (36.7 C) 98.6 F (37 C) 98.8 F (37.1 C)   TempSrc: Oral Oral Oral   SpO2: 100% 100% 100% 100%  Weight:      Height:        Intake/Output Summary (Last 24 hours) at 01/21/2024 1833 Last data filed at 01/21/2024 1811 Gross per 24 hour  Intake 1044.28 ml  Output 1150 ml  Net -105.72 ml   Filed Weights   01/18/24 0409 01/19/24 0500 01/21/24 0500  Weight: 69.4 kg 71.5 kg 70.5 kg   Examination: Physical Exam:  Constitutional: Thin chronically ill-appearing elderly AAM resting in remains a little confused but appears calm Respiratory: Diminished to auscultation bilaterally, no wheezing, rales, rhonchi or crackles. Normal respiratory effort and patient is not tachypenic. No accessory muscle use.  Unlabored breathing Cardiovascular: RRR, no murmurs / rubs / gallops. S1 and S2 auscultated.  Trace extremity edema Abdomen: Soft, non-tender,  non-distended. Bowel sounds positive.  GU: Deferred. Musculoskeletal: No clubbing / cyanosis of digits/nails. No joint deformity upper and lower extremities.  Skin: No rashes, lesions, ulcers on a limited skin evaluation. No induration; Warm and dry.  Neurologic: CN 2-12 grossly intact with no focal deficits.Romberg sign cerebellar reflexes not assessed.  Psychiatric: He is awake and and remains a little confused.  Appears calm  Data Reviewed: I have personally reviewed following labs and imaging studies  CBC: Recent Labs  Lab 01/17/24 0231 01/18/24 0221 01/19/24 0548 01/20/24 0419 01/21/24 0540  WBC 6.2 6.7 11.0* 8.4 6.5  NEUTROABS  --   --   --   --  4.1  HGB 7.3* 7.7* 8.5* 8.0* 8.3*  HCT 22.2* 23.6* 26.0* 24.2* 25.3*  MCV 77.4* 78.7* 78.3* 77.6* 78.6*  PLT 276 313 363 336 357   Basic Metabolic Panel: Recent Labs  Lab  01/17/24 0231 01/17/24 1207 01/18/24 0221 01/19/24 0548 01/19/24 1647 01/20/24 0419 01/21/24 0540  NA 138 139 139 138  --  135 136  K 3.7 3.5 3.8 3.7 3.9 3.9 4.7  CL 103 106 104 101  --  100 101  CO2 23 24 26 28   --  28 27  GLUCOSE 159* 252* 121* 155*  --  205* 106*  BUN 36* 37* 40* 36*  --  32* 28*  CREATININE 2.21* 2.22* 1.91* 1.56*  --  1.48* 1.39*  CALCIUM  7.9* 8.2* 8.2* 8.2*  --  8.1* 8.5*  MG 2.3  --  2.1 1.9  --  1.7 1.7  PHOS 3.8  --  1.3* <1.0* 2.5 1.6* 2.9   GFR: Estimated Creatinine Clearance: 38 mL/min (A) (by C-G formula based on SCr of 1.39 mg/dL (H)). Liver Function Tests: Recent Labs  Lab 01/15/24 0251 01/17/24 1207 01/20/24 0419 01/21/24 0540  AST 88* 50* 453* 931*  ALT 69* 10 173* 393*  ALKPHOS 90 81 101 105  BILITOT 0.5 0.6 0.2 0.6  PROT 6.8 6.0* 4.5* 5.7*  ALBUMIN  2.4* 2.0* 1.7*  1.7* 1.7*   No results for input(s): LIPASE, AMYLASE in the last 168 hours. Recent Labs  Lab 01/16/24 0926 01/21/24 1013  AMMONIA 13 31   Coagulation Profile: Recent Labs  Lab 01/21/24 1013  INR 1.2   Cardiac Enzymes: No  results for input(s): CKTOTAL, CKMB, CKMBINDEX, TROPONINI in the last 168 hours. BNP (last 3 results) No results for input(s): PROBNP in the last 8760 hours. HbA1C: No results for input(s): HGBA1C in the last 72 hours. CBG: Recent Labs  Lab 01/20/24 2344 01/21/24 0351 01/21/24 0814 01/21/24 1201 01/21/24 1554  GLUCAP 149* 104* 98 170* 192*   Lipid Profile: No results for input(s): CHOL, HDL, LDLCALC, TRIG, CHOLHDL, LDLDIRECT in the last 72 hours. Thyroid  Function Tests: No results for input(s): TSH, T4TOTAL, FREET4, T3FREE, THYROIDAB in the last 72 hours. Anemia Panel: No results for input(s): VITAMINB12, FOLATE, FERRITIN, TIBC, IRON, RETICCTPCT in the last 72 hours. Sepsis Labs: No results for input(s): PROCALCITON, LATICACIDVEN in the last 168 hours.  Recent Results (from the past 240 hours)  Surgical pcr screen     Status: None   Collection Time: 01/14/24  4:47 AM   Specimen: Nasal Mucosa; Nasal Swab  Result Value Ref Range Status   MRSA, PCR NEGATIVE NEGATIVE Final   Staphylococcus aureus NEGATIVE NEGATIVE Final    Comment: (NOTE) The Xpert SA Assay (FDA approved for NASAL specimens in patients 25 years of age and older), is one component of a comprehensive surveillance program. It is not intended to diagnose infection nor to guide or monitor treatment. Performed at [facility], [address] [place], [place] [address]   Culture, blood (Routine X 2) w Reflex to ID Panel     Status: None (Preliminary result)   Collection Time: 01/19/24  9:58 AM   Specimen: BLOOD RIGHT ARM  Result Value Ref Range Status   Specimen Description BLOOD RIGHT ARM  Final   Special Requests   Final    BOTTLES DRAWN AEROBIC AND ANAEROBIC Blood Culture results may not be optimal due to an inadequate volume of blood received in culture bottles   Culture   Final    NO GROWTH 2 DAYS Performed at [facility], [address]  [place], [place] [address]    Report Status PENDING  Incomplete  Culture, blood (Routine X 2) w Reflex to  ID Panel     Status: None (Preliminary result)   Collection Time: 01/19/24 10:17 AM   Specimen: BLOOD RIGHT HAND  Result Value Ref Range Status   Specimen Description BLOOD RIGHT HAND  Final   Special Requests   Final    BOTTLES DRAWN AEROBIC AND ANAEROBIC Blood Culture results may not be optimal due to an inadequate volume of blood received in culture bottles   Culture   Final    NO GROWTH 2 DAYS Performed at [facility], [address], [place], [place] [address]    Report Status PENDING  Incomplete    Radiology Studies: US  Abdomen Limited RUQ (LIVER/GB) Result Date: 01/21/2024 CLINICAL DATA:  Elevated LFTs. EXAM: ULTRASOUND ABDOMEN LIMITED RIGHT UPPER QUADRANT COMPARISON:  CT scan 11/27/2023. FINDINGS: Gallbladder: No gallstones or wall thickening visualized. No sonographic Murphy sign noted by sonographer. Common bile duct: Diameter: 5 mm Liver: Liver parenchyma is echogenic. Subtle nodular appearance of the liver contour (see images 34 and 31) raises the question of underlying cirrhosis. Portal vein is patent on color Doppler imaging with normal direction of blood flow towards the liver. Other: None. IMPRESSION: 1. Echogenic liver parenchyma with subtle nodular appearance of the liver contour raising the question of underlying cirrhosis. 2. No evidence for cholelithiasis or biliary dilatation. Electronically Signed   By: Camellia Candle M.D.   On: 01/21/2024 06:41   Scheduled Meds:  aspirin   81 mg Per Tube BID   Or   aspirin   300 mg Rectal BID   cholecalciferol  1,000 Units Oral Daily   feeding supplement  237 mL Oral BID BM   feeding supplement (OSMOLITE 1.5 CAL)  720 mL Per Tube Q24H   free water  100 mL Per Tube Q6H   Influenza vac split trivalent PF  0.5 mL Intramuscular Tomorrow-1000   insulin aspart  0-9 Units Subcutaneous Q4H   isosorbide  mononitrate  30 mg Oral Daily    metoprolol succinate  25 mg Oral Daily   multivitamin with minerals  1 tablet Oral Daily   pneumococcal 20-valent conjugate vaccine  0.5 mL Intramuscular Tomorrow-1000   thiamine  100 mg Oral Daily   Continuous Infusions:   LOS: 8 days   Alejandro Marker, DO [facility] Available via Epic secure chat 7am-7pm After these hours, please refer to coverage provider listed on [email or web address] 01/21/2024, 6:33 PM

## 2024-01-22 DIAGNOSIS — G928 Other toxic encephalopathy: Secondary | ICD-10-CM | POA: Diagnosis not present

## 2024-01-22 DIAGNOSIS — W19XXXA Unspecified fall, initial encounter: Secondary | ICD-10-CM | POA: Diagnosis not present

## 2024-01-22 DIAGNOSIS — S72001A Fracture of unspecified part of neck of right femur, initial encounter for closed fracture: Secondary | ICD-10-CM | POA: Diagnosis not present

## 2024-01-22 DIAGNOSIS — E43 Unspecified severe protein-calorie malnutrition: Secondary | ICD-10-CM | POA: Diagnosis not present

## 2024-01-22 LAB — PHOSPHORUS: Phosphorus: 2.8 mg/dL (ref 2.5–4.6)

## 2024-01-22 LAB — COMPREHENSIVE METABOLIC PANEL WITH GFR
ALT: 351 U/L — ABNORMAL HIGH (ref 0–44)
AST: 630 U/L — ABNORMAL HIGH (ref 15–41)
Albumin: 1.8 g/dL — ABNORMAL LOW (ref 3.5–5.0)
Alkaline Phosphatase: 106 U/L (ref 38–126)
Anion gap: 8 (ref 5–15)
BUN: 31 mg/dL — ABNORMAL HIGH (ref 8–23)
CO2: 27 mmol/L (ref 22–32)
Calcium: 8.4 mg/dL — ABNORMAL LOW (ref 8.9–10.3)
Chloride: 101 mmol/L (ref 98–111)
Creatinine, Ser: 1.46 mg/dL — ABNORMAL HIGH (ref 0.61–1.24)
GFR, Estimated: 47 mL/min — ABNORMAL LOW (ref >60)
Glucose, Bld: 146 mg/dL — ABNORMAL HIGH (ref 70–99)
Potassium: 5 mmol/L (ref 3.5–5.1)
Sodium: 136 mmol/L (ref 135–145)
Total Bilirubin: 0.5 mg/dL (ref 0.0–1.2)
Total Protein: 5.6 g/dL — ABNORMAL LOW (ref 6.5–8.1)

## 2024-01-22 LAB — GLUCOSE, CAPILLARY
Glucose-Capillary: 116 mg/dL — ABNORMAL HIGH (ref 70–99)
Glucose-Capillary: 152 mg/dL — ABNORMAL HIGH (ref 70–99)
Glucose-Capillary: 175 mg/dL — ABNORMAL HIGH (ref 70–99)
Glucose-Capillary: 178 mg/dL — ABNORMAL HIGH (ref 70–99)
Glucose-Capillary: 190 mg/dL — ABNORMAL HIGH (ref 70–99)

## 2024-01-22 LAB — CBC WITH DIFFERENTIAL/PLATELET
Abs Immature Granulocytes: 0.03 K/uL (ref 0.00–0.07)
Basophils Absolute: 0.1 K/uL (ref 0.0–0.1)
Basophils Relative: 1 %
Eosinophils Absolute: 0.3 K/uL (ref 0.0–0.5)
Eosinophils Relative: 4 %
HCT: 22.6 % — ABNORMAL LOW (ref 39.0–52.0)
Hemoglobin: 7.5 g/dL — ABNORMAL LOW (ref 13.0–17.0)
Immature Granulocytes: 1 %
Lymphocytes Relative: 26 %
Lymphs Abs: 1.6 K/uL (ref 0.7–4.0)
MCH: 26 pg (ref 26.0–34.0)
MCHC: 33.2 g/dL (ref 30.0–36.0)
MCV: 78.2 fL — ABNORMAL LOW (ref 80.0–100.0)
Monocytes Absolute: 0.7 K/uL (ref 0.1–1.0)
Monocytes Relative: 11 %
Neutro Abs: 3.6 K/uL (ref 1.7–7.7)
Neutrophils Relative %: 57 %
Platelets: 386 K/uL (ref 150–400)
RBC: 2.89 MIL/uL — ABNORMAL LOW (ref 4.22–5.81)
RDW: 17.1 % — ABNORMAL HIGH (ref 11.5–15.5)
WBC: 6.2 K/uL (ref 4.0–10.5)
nRBC: 0 % (ref 0.0–0.2)

## 2024-01-22 LAB — ANTI-SMOOTH MUSCLE ANTIBODY, IGG: F-Actin IgG: 14 U (ref 0–19)

## 2024-01-22 LAB — MAGNESIUM: Magnesium: 1.7 mg/dL (ref 1.7–2.4)

## 2024-01-22 LAB — MITOCHONDRIAL ANTIBODIES: Mitochondrial M2 Ab, IgG: <20 U (ref 0.0–20.0)

## 2024-01-22 MED ORDER — MAGNESIUM SULFATE 2 GM/50ML IV SOLN
2.0000 g | Freq: Once | INTRAVENOUS | Status: AC
  Administered 2024-01-22: 2 g via INTRAVENOUS
  Filled 2024-01-22: qty 50

## 2024-01-22 MED ORDER — ENSURE PLUS HIGH PROTEIN PO LIQD
237.0000 mL | Freq: Three times a day (TID) | ORAL | Status: DC
  Administered 2024-01-22 – 2024-01-24 (×7): 237 mL via ORAL

## 2024-01-22 NOTE — Progress Notes (Signed)
 Nutrition Follow-up  DOCUMENTATION CODES:   Severe malnutrition in context of chronic illness  INTERVENTION:  Encourage PO intake: Nursing to assist with meals Room service with assist  Went over preordering meals with daughter at bedside Increase Ensure Plus High Protein po to TID, each supplement provides 350 kcal and 20 grams of protein Continue Magic cup TID with meals, each supplement provides 290 kcal and 9 grams of protein 100 mg Thiamine x 7 days po MVI with minerals daily po 1,000 units vitamin D  daily po Discontinue Calorie count Remove cortrak tube, discontinue tube feeds  NUTRITION DIAGNOSIS:   Severe Malnutrition related to chronic illness as evidenced by severe muscle depletion, severe fat depletion. - Ongoing   GOAL:   Patient will meet greater than or equal to 90% of their needs - Progressing   MONITOR:   PO intake, Supplement acceptance, Diet advancement, Labs, Skin  REASON FOR ASSESSMENT:   Consult Assessment of nutrition requirement/status, Hip fracture protocol  ASSESSMENT:   88 y.o. male with PMH of AAA, CAD, CKD 3a, duodenal ulcer/history of GI bleed, CKD stage IIIa, CAD, HLD, HTN,  MI. Admitted after ground level fall and found to have right femoral neck fracture s/p rigth hemiarthoplasty. Post op complicated from AMS, transferred to progressive unit for further evaluation.  11/2 - Imaging of right hip and pelvis showed displaced subcapital femoral neck fracture  11/3 - s/p Right Hemiarthroplasty  11/4 - Change of mental status, code stroke CT negative for acute abnormality, Transferred to progressive, MRI negative, SLP eval->NPO 11/5 - Cortrak placed, tube feeds started  11/6 - Dysphagia 3, thin liquids, tube feeds at 40 ml/hr  11/7 - Tube feeds at goal, transition to nocturnal tube feeds  11/11 - Calorie count >60% needs with just 2 meals, D/C cortrak and tube feeds   Pt resting in bed with daughter at bedside, 48 hour calorie count over the  weekend showed pt was meeting <10% of needs however question wether there was complete documentation available as RN reports pt was eating pretty well over the weekend and drinking all Ensures provided.   RD conducted another calorie count today and it revealed pt doing fair with meals eating 25-50% of breakfast and lunch but doing very well with supplements such as Ensures. Already had 2 Ensures so far today. Just for 2 meals pt meeting >60% calories and protein needs. Suspect pt will be meeting 80-100% with dinner and other supplements provided. Discussed with MD and RN. Ok to remove cortrak. Increase Ensure to TID and encouraged increased po intake with pt and daughter.   Day 1, 11/11: Breakfast: 50% eggs, 50% sausage (160 kcal, 8 gm protein) Lunch: 90% angel food cake, 50% Magic cup, 25% pot roast (290 kcal, 12 gm protein) Supplements: 2 Ensure (700 kcal , 40 gm protein)  Total intake: 1150 kcal (64% of minimum estimated needs)  60 gm protein (66% of minimum estimated needs)  Dispo: SNF  Admit weight: 66.8 kg Current weight: 70.5 kg   Average Meal Intake: 11/6-11/11: 25-50% intake x 4 recorded meals  Nutritionally Relevant Medications: Scheduled Meds:  cholecalciferol  1,000 Units Oral Daily   feeding supplement  237 mL Oral TID BM   insulin aspart  0-9 Units Subcutaneous Q4H   multivitamin with minerals  1 tablet Oral Daily   thiamine  100 mg Oral Daily   Labs Reviewed: BUN 31 Creatinine 1.46 AST 630 ALT 351 GFR 47 CBG ranges from 98-190 mg/dL over the last 24 hours  HgbA1c 6.0  Diet Order:   Diet Order             DIET DYS 3 Room service appropriate? Yes with Assist; Fluid consistency: Thin  Diet effective now                   EDUCATION NEEDS:   Not appropriate for education at this time  Skin:  Skin Assessment: Skin Integrity Issues: Skin Integrity Issues:: Incisions Incisions: Closed surgical incision left groin  Last BM:  11/10 - X 2, Type  6  Height:   Ht Readings from Last 1 Encounters:  01/14/24 6' 1 (1.854 m)    Weight:   Wt Readings from Last 1 Encounters:  01/21/24 70.5 kg    Ideal Body Weight:  83.6 kg  BMI:  Body mass index is 20.51 kg/m.  Estimated Nutritional Needs:   Kcal:  1800-2000 kcal  Protein:  90-110 gm  Fluid:  >1.8L/day   Olivia Kenning, RD Registered Dietitian  See Amion for more information

## 2024-01-22 NOTE — Progress Notes (Signed)
 Subjective: States he had sausage and eggs for breakfast along with Ensure.  Denies abdominal pain.  Objective: Vital signs in last 24 hours: Temp:  [98 F (36.7 C)-99.2 F (37.3 C)] 98 F (36.7 C) (11/11 0310) Pulse Rate:  [77-85] 78 (11/10 2318) Resp:  [12-18] 17 (11/10 2318) BP: (98-140)/(62-84) 140/84 (11/10 2318) SpO2:  [100 %] 100 % (11/10 2318) Weight change:  Last BM Date : 01/20/24  PE: Awake, alert x 3 GENERAL: Nonicteric, prominent pallor  ABDOMEN: Nondistended EXTREMITIES: No edema  Lab Results: Results for orders placed or performed during the hospital encounter of 01/13/24 (from the past 48 hours)  Glucose, capillary     Status: Abnormal   Collection Time: 01/20/24 11:56 AM  Result Value Ref Range   Glucose-Capillary 161 (H) 70 - 99 mg/dL    Comment: Glucose reference range applies only to samples taken after fasting for at least 8 hours.  Glucose, capillary     Status: Abnormal   Collection Time: 01/20/24  4:13 PM  Result Value Ref Range   Glucose-Capillary 109 (H) 70 - 99 mg/dL    Comment: Glucose reference range applies only to samples taken after fasting for at least 8 hours.  Glucose, capillary     Status: Abnormal   Collection Time: 01/20/24  7:59 PM  Result Value Ref Range   Glucose-Capillary 105 (H) 70 - 99 mg/dL    Comment: Glucose reference range applies only to samples taken after fasting for at least 8 hours.  Glucose, capillary     Status: Abnormal   Collection Time: 01/20/24 11:44 PM  Result Value Ref Range   Glucose-Capillary 149 (H) 70 - 99 mg/dL    Comment: Glucose reference range applies only to samples taken after fasting for at least 8 hours.  Glucose, capillary     Status: Abnormal   Collection Time: 01/21/24  3:51 AM  Result Value Ref Range   Glucose-Capillary 104 (H) 70 - 99 mg/dL    Comment: Glucose reference range applies only to samples taken after fasting for at least 8 hours.  Hepatitis panel, acute     Status: None    Collection Time: 01/21/24  5:40 AM  Result Value Ref Range   Hepatitis B Surface Ag NON REACTIVE NON REACTIVE   HCV Ab NON REACTIVE NON REACTIVE    Comment: (NOTE) Nonreactive HCV antibody screen is consistent with no HCV infections,  unless recent infection is suspected or other evidence exists to indicate HCV infection.     Hep A IgM NON REACTIVE NON REACTIVE   Hep B C IgM NON REACTIVE NON REACTIVE    Comment: Performed at [facility], [address] [place], [place] [address]  CBC with Differential/Platelet     Status: Abnormal   Collection Time: 01/21/24  5:40 AM  Result Value Ref Range   WBC 6.5 4.0 - 10.5 K/uL   RBC 3.22 (L) 4.22 - 5.81 MIL/uL   Hemoglobin 8.3 (L) 13.0 - 17.0 g/dL   HCT 74.6 (L) 60.9 - 47.9 %   MCV 78.6 (L) 80.0 - 100.0 fL   MCH 25.8 (L) 26.0 - 34.0 pg   MCHC 32.8 30.0 - 36.0 g/dL   RDW 82.6 (H) 88.4 - 84.4 %   Platelets 357 150 - 400 K/uL   nRBC 0.0 0.0 - 0.2 %   Neutrophils Relative % 62 %   Neutro Abs 4.1 1.7 - 7.7 K/uL   Lymphocytes Relative 21 %   Lymphs  Abs 1.4 0.7 - 4.0 K/uL   Monocytes Relative 10 %   Monocytes Absolute 0.7 0.1 - 1.0 K/uL   Eosinophils Relative 5 %   Eosinophils Absolute 0.3 0.0 - 0.5 K/uL   Basophils Relative 1 %   Basophils Absolute 0.1 0.0 - 0.1 K/uL   Immature Granulocytes 1 %   Abs Immature Granulocytes 0.03 0.00 - 0.07 K/uL    Comment: Performed at [facility], [address] [place], [place] [address]  Comprehensive metabolic panel with GFR     Status: Abnormal   Collection Time: 01/21/24  5:40 AM  Result Value Ref Range   Sodium 136 135 - 145 mmol/L   Potassium 4.7 3.5 - 5.1 mmol/L   Chloride 101 98 - 111 mmol/L   CO2 27 22 - 32 mmol/L   Glucose, Bld 106 (H) 70 - 99 mg/dL    Comment: Glucose reference range applies only to samples taken after fasting for at least 8 hours.   BUN 28 (H) 8 - 23 mg/dL   Creatinine, Ser 8.60 (H) 0.61 - 1.24 mg/dL   Calcium  8.5 (L) 8.9 - 10.3 mg/dL   Total Protein 5.7  (L) 6.5 - 8.1 g/dL   Albumin  1.7 (L) 3.5 - 5.0 g/dL   AST 068 (H) 15 - 41 U/L   ALT 393 (H) 0 - 44 U/L   Alkaline Phosphatase 105 38 - 126 U/L   Total Bilirubin 0.6 0.0 - 1.2 mg/dL   GFR, Estimated 49 (L) >60 mL/min    Comment: (NOTE) Calculated using the CKD-EPI Creatinine Equation (2021)    Anion gap 8 5 - 15    Comment: Performed at [facility], [address], [place], [place] [address]  Magnesium      Status: None   Collection Time: 01/21/24  5:40 AM  Result Value Ref Range   Magnesium  1.7 1.7 - 2.4 mg/dL    Comment: Performed at [facility], [address], [place], [place] [address]  Phosphorus     Status: None   Collection Time: 01/21/24  5:40 AM  Result Value Ref Range   Phosphorus 2.9 2.5 - 4.6 mg/dL    Comment: Performed at [facility], [address] [place], [place] [address]  Glucose, capillary     Status: None   Collection Time: 01/21/24  8:14 AM  Result Value Ref Range   Glucose-Capillary 98 70 - 99 mg/dL    Comment: Glucose reference range applies only to samples taken after fasting for at least 8 hours.  Ammonia     Status: None   Collection Time: 01/21/24 10:13 AM  Result Value Ref Range   Ammonia 31 9 - 35 umol/L    Comment: Performed at [facility], [address], [place], [place] [address]  Protime-INR     Status: Abnormal   Collection Time: 01/21/24 10:13 AM  Result Value Ref Range   Prothrombin Time 15.9 (H) 11.4 - 15.2 seconds   INR 1.2 0.8 - 1.2    Comment: (NOTE) INR goal varies based on device and disease states. Performed at [facility], [address], [place], [place] [address]   Glucose, capillary     Status: Abnormal   Collection Time: 01/21/24 12:01 PM  Result Value Ref Range   Glucose-Capillary 170 (H) 70 - 99 mg/dL    Comment: Glucose reference range applies only to samples taken after fasting for at least 8 hours.  Glucose, capillary     Status: Abnormal   Collection Time: 01/21/24  3:54 PM   Result Value Ref Range   Glucose-Capillary 192 (H) 70 - 99 mg/dL    Comment: Glucose reference range applies only to samples taken after fasting for at least 8 hours.  Glucose, capillary     Status: Abnormal   Collection Time: 01/21/24  7:40 PM  Result Value Ref Range   Glucose-Capillary 153 (H) 70 - 99 mg/dL    Comment: Glucose reference range applies only to samples taken after fasting for at least 8 hours.  Glucose, capillary     Status: Abnormal   Collection Time: 01/21/24 11:16 PM  Result Value Ref Range   Glucose-Capillary 178 (H) 70 - 99 mg/dL    Comment: Glucose reference range applies only to samples taken after fasting for at least 8 hours.  Comprehensive metabolic panel with GFR     Status: Abnormal   Collection Time: 01/22/24  2:37 AM  Result Value Ref Range   Sodium 136 135 - 145 mmol/L   Potassium 5.0 3.5 - 5.1 mmol/L   Chloride 101 98 - 111 mmol/L   CO2 27 22 - 32 mmol/L   Glucose, Bld 146 (H) 70 - 99 mg/dL    Comment: Glucose reference range applies only to samples taken after fasting for at least 8 hours.   BUN 31 (H) 8 - 23 mg/dL   Creatinine, Ser 8.53 (H) 0.61 - 1.24 mg/dL   Calcium  8.4 (L) 8.9 - 10.3 mg/dL   Total Protein 5.6 (L) 6.5 - 8.1 g/dL   Albumin  1.8 (L) 3.5 - 5.0 g/dL   AST 369 (H) 15 - 41 U/L   ALT 351 (H) 0 - 44 U/L   Alkaline Phosphatase 106 38 - 126 U/L   Total Bilirubin 0.5 0.0 - 1.2 mg/dL   GFR, Estimated 47 (L) >60 mL/min    Comment: (NOTE) Calculated using the CKD-EPI Creatinine Equation (2021)    Anion gap 8 5 - 15    Comment: Performed at [facility], [address] [place], [place] [address]  CBC with Differential/Platelet     Status: Abnormal   Collection Time: 01/22/24  2:37 AM  Result Value Ref Range   WBC 6.2 4.0 - 10.5 K/uL   RBC 2.89 (L) 4.22 - 5.81 MIL/uL   Hemoglobin 7.5 (L) 13.0 - 17.0 g/dL   HCT 77.3 (L) 60.9 - 47.9 %   MCV 78.2 (L) 80.0 - 100.0 fL   MCH 26.0 26.0 - 34.0 pg   MCHC 33.2 30.0 - 36.0 g/dL   RDW  82.8 (H) 88.4 - 15.5 %   Platelets 386 150 - 400 K/uL   nRBC 0.0 0.0 - 0.2 %   Neutrophils Relative % 57 %   Neutro Abs 3.6 1.7 - 7.7 K/uL   Lymphocytes Relative 26 %   Lymphs Abs 1.6 0.7 - 4.0 K/uL   Monocytes Relative 11 %   Monocytes Absolute 0.7 0.1 - 1.0 K/uL   Eosinophils Relative 4 %   Eosinophils Absolute 0.3 0.0 - 0.5 K/uL   Basophils Relative 1 %   Basophils Absolute 0.1 0.0 - 0.1 K/uL   Immature Granulocytes 1 %   Abs Immature Granulocytes 0.03 0.00 - 0.07 K/uL    Comment: Performed at [facility], [address] [place], [place] [address]  Magnesium      Status: None   Collection Time: 01/22/24  2:37 AM  Result  Value Ref Range   Magnesium  1.7 1.7 - 2.4 mg/dL    Comment: Performed at [facility], [address], [place], [place] [address]  Phosphorus     Status: None   Collection Time: 01/22/24  2:37 AM  Result Value Ref Range   Phosphorus 2.8 2.5 - 4.6 mg/dL    Comment: Performed at [facility], [address], [place], [place] [address]  Glucose, capillary     Status: Abnormal   Collection Time: 01/22/24  3:09 AM  Result Value Ref Range   Glucose-Capillary 178 (H) 70 - 99 mg/dL    Comment: Glucose reference range applies only to samples taken after fasting for at least 8 hours.  Glucose, capillary     Status: Abnormal   Collection Time: 01/22/24  7:24 AM  Result Value Ref Range   Glucose-Capillary 190 (H) 70 - 99 mg/dL    Comment: Glucose reference range applies only to samples taken after fasting for at least 8 hours.    Studies/Results: US  Abdomen Limited RUQ (LIVER/GB) Result Date: 01/21/2024 CLINICAL DATA:  Elevated LFTs. EXAM: ULTRASOUND ABDOMEN LIMITED RIGHT UPPER QUADRANT COMPARISON:  CT scan 11/27/2023. FINDINGS: Gallbladder: No gallstones or wall thickening visualized. No sonographic Murphy sign noted by sonographer. Common bile duct: Diameter: 5 mm Liver: Liver parenchyma is echogenic. Subtle nodular appearance of the liver contour  (see images 34 and 31) raises the question of underlying cirrhosis. Portal vein is patent on color Doppler imaging with normal direction of blood flow towards the liver. Other: None. IMPRESSION: 1. Echogenic liver parenchyma with subtle nodular appearance of the liver contour raising the question of underlying cirrhosis. 2. No evidence for cholelithiasis or biliary dilatation. Electronically Signed   By: Camellia Candle M.D.   On: 01/21/2024 06:41    Medications: I have reviewed the patient's current medications.  Assessment: Abnormal LFTs, likely ischemic hepatitis, trending down, reassuring T. bili remains normal, ALP remains normal AST 630 from 931 ALT 351 from 393  No signs of encephalopathy or coagulopathy Subtle changes of cirrhosis noted on ultrasound, without evidence of ascites  Plan: Labs pending for alpha-1 antitrypsin, ceruloplasmin, ASMA and AMA, which can be followed as an outpatient.  Ischemic hepatitis is self-limited and should resolve on its own. Recommend repeating LFTs tomorrow in a.m.  GI will follow from periphery.  Estelita Manas, MD 01/22/2024, 9:34 AM

## 2024-01-22 NOTE — Plan of Care (Signed)
 Problem: Education: Goal: Knowledge of General Education information will improve Description: Including pain rating scale, medication(s)/side effects and non-pharmacologic comfort measures Outcome: Progressing   Problem: Health Behavior/Discharge Planning: Goal: Ability to manage health-related needs will improve Outcome: Progressing   Problem: Clinical Measurements: Goal: Ability to maintain clinical measurements within normal limits will improve Outcome: Progressing Goal: Will remain free from infection Outcome: Progressing Goal: Diagnostic test results will improve Outcome: Progressing Goal: Respiratory complications will improve Outcome: Progressing Goal: Cardiovascular complication will be avoided Outcome: Progressing   Problem: Activity: Goal: Risk for activity intolerance will decrease Outcome: Progressing   Problem: Nutrition: Goal: Adequate nutrition will be maintained Outcome: Progressing   Problem: Coping: Goal: Level of anxiety will decrease Outcome: Progressing   Problem: Elimination: Goal: Will not experience complications related to bowel motility Outcome: Progressing Goal: Will not experience complications related to urinary retention Outcome: Progressing   Problem: Pain Managment: Goal: General experience of comfort will improve and/or be controlled Outcome: Progressing   Problem: Safety: Goal: Ability to remain free from injury will improve Outcome: Progressing   Problem: Skin Integrity: Goal: Risk for impaired skin integrity will decrease Outcome: Progressing   Problem: Education: Goal: Required Educational Video(s) Outcome: Progressing   Problem: Clinical Measurements: Goal: Postoperative complications will be avoided or minimized Outcome: Progressing   Problem: Skin Integrity: Goal: Demonstration of wound healing without infection will improve Outcome: Progressing   Problem: Delirium Goal: STG: [place]'s ability to perform  activities of daily living will improve Outcome: Progressing Goal: STG: Iori's ability to verbalize feelings about their condition will improve Outcome: Progressing   Problem: Education: Goal: Knowledge of disease or condition will improve Outcome: Progressing Goal: Knowledge of secondary prevention will improve (MUST DOCUMENT ALL) Outcome: Progressing Goal: Knowledge of patient specific risk factors will improve (DELETE if not current risk factor) Outcome: Progressing   Problem: Ischemic Stroke/TIA Tissue Perfusion: Goal: Complications of ischemic stroke/TIA will be minimized Outcome: Progressing   Problem: Coping: Goal: Will verbalize positive feelings about self Outcome: Progressing Goal: Will identify appropriate support needs Outcome: Progressing   Problem: Health Behavior/Discharge Planning: Goal: Ability to manage health-related needs will improve Outcome: Progressing Goal: Goals will be collaboratively established with patient/family Outcome: Progressing   Problem: Self-Care: Goal: Ability to participate in self-care as condition permits will improve Outcome: Progressing Goal: Verbalization of feelings and concerns over difficulty with self-care will improve Outcome: Progressing Goal: Ability to communicate needs accurately will improve Outcome: Progressing   Problem: Nutrition: Goal: Risk of aspiration will decrease Outcome: Progressing Goal: Dietary intake will improve Outcome: Progressing   Problem: Education: Goal: Verbalization of understanding the information provided (i.e., activity precautions, restrictions, etc) will improve Outcome: Progressing   Problem: Activity: Goal: Ability to ambulate and perform ADLs will improve Outcome: Progressing   Problem: Clinical Measurements: Goal: Postoperative complications will be avoided or minimized Outcome: Progressing   Problem: Self-Concept: Goal: Ability to maintain and perform role responsibilities to  the fullest extent possible will improve Outcome: Progressing   Problem: Pain Management: Goal: Pain level will decrease Outcome: Progressing   Problem: Education: Goal: Ability to describe self-care measures that may prevent or decrease complications (Diabetes Survival Skills Education) will improve Outcome: Progressing Goal: Individualized Educational Video(s) Outcome: Progressing   Problem: Coping: Goal: Ability to adjust to condition or change in health will improve Outcome: Progressing   Problem: Fluid Volume: Goal: Ability to maintain a balanced intake and output will improve Outcome: Progressing   Problem: Health Behavior/Discharge Planning: Goal: Ability to identify and  utilize available resources and services will improve Outcome: Progressing Goal: Ability to manage health-related needs will improve Outcome: Progressing   Problem: Metabolic: Goal: Ability to maintain appropriate glucose levels will improve Outcome: Progressing   Problem: Nutritional: Goal: Maintenance of adequate nutrition will improve Outcome: Progressing Goal: Progress toward achieving an optimal weight will improve Outcome: Progressing   Problem: Skin Integrity: Goal: Risk for impaired skin integrity will decrease Outcome: Progressing   Problem: Tissue Perfusion: Goal: Adequacy of tissue perfusion will improve Outcome: Progressing

## 2024-01-22 NOTE — Progress Notes (Signed)
 PROGRESS NOTE    Juan Goodwin  FMW:[id number] DOB: 1935-08-06 DOA: 01/13/2024 PCP: [name], MD   Brief Narrative:  Juan Goodwin is a 88 y.o. male with a history of AAA, CAD, CKD stage IIIa, subdural hematoma, duodenal ulcer, CAD, hyperlipidemia, primary pretension.  Patient presented secondary to a fall and resultant right femoral neck fracture.  Orthopedic surgery was consulted and performed a right hip hemiarthroplasty on 11/3.  Hospitalization complicated by resultant metabolic encephalopathy of unknown etiology.  Had a temperature a few days ago but this is resolved.  LFTs started worsening so GI was consulted and feel there is ischemic hepatitis and they feel that this is reassuring and self-limited given improvement in LFTs.  Cortrak has been removed and will continue to monitor his oral intake.  He is getting close to being medically stable for discharge given his improvement.  PT/OT recommending SNF  Assessment and Plan:  Right Femoral Neck Fracture: Secondary to fall. Orthopedic surgery consulted and performed a right hemiarthroplasty on 11/3.  Orthopedic surgery recommendation for weightbearing as tolerated regarding right lower extremity with posterior hip precautions in addition to aspirin  81 mg twice daily for DVT prophylaxis. C/w Bowel Regimen and Pain Control.  PT OT recommending SNF and now will start pursuing given LFTs improving given that he also has Cortrak removed   Acute Metabolic Encephalopathy: Presumed diagnosis, although unclear etiology and is improving. Symptoms occurred post-surgery. CT head and MRI brain without evidence of acute intracranial process. EEG obtained and was negative for evidence of seizure activity. Some worsening renal function with BUN up to 29. TSH, Vitamin B12, folate, ammonia level do not explain symptoms (Ammonia Lvl was 31). Mentation continues to improve slowly. Continue to Maintain Delirium Precautions    Dysphagia and poor po  intake: Secondary to mental status change.  Patient was seen and evaluated by speech therapy who initially recommended n.p.o. status but now on D3 after SLP re-evaluation.  Failed his calorie count over the weekend but is doing markedly well today so we will remove his core track and repeat calorie count. -Dietitian recommendations (11/11): Encourage PO intake: Nursing to assist with meals Room service with assist  Went over preordering meals with daughter at bedside Increase Ensure Plus High Protein po to TID, each supplement provides 350 kcal and 20 grams of protein Continue Magic cup TID with meals, each supplement provides 290 kcal and 9 grams of protein 100 mg Thiamine x 7 days po MVI with minerals daily po 1,000 units vitamin D  daily po Discontinue Calorie count Remove cortrak tube, discontinue tube feeds   Hyponatremia: Mild and Resolved. Na+ was 136 on last check. CTM & Trend & repeat CMP in the AM   Abnormal/Elevated LFTs in the setting of ? Liver Cirrhosis: Initially mild but now worsening significantly. Unclear etiology. AST and ALT Trend now improving:  Recent Labs  Lab 01/01/24 1100 01/13/24 1954 01/15/24 0251 01/17/24 1207 01/20/24 0419 01/21/24 0540 01/22/24 0237  AST 27 103* 88* 50* 453* 931* 630*  ALT 15 76* 69* 10 173* 393* 351*  -Checked RUQ U/S and it showed Echogenic liver parenchyma with subtle nodular appearance of the liver contour raising the question of underlying cirrhosis and  No evidence for cholelithiasis or biliary dilatation.  Acute Hepatitis Panel Non-Reactive.  -Ammonia Lvl normal at 31. GI consulted for further evaluation and they are recommending continue to monitor LFTs and complete workup with ASMA, AMA, alpha-1 antitrypsin and ceruloplasmin; they feel it may may  be related to ischemic hepatitis but labs are trending down and reassuring.  GI feels that this is self-limited and should resolve on its own and recommending repeating LFTs in the  a.m.  History of CAD: Noted.  Patient with prior NSTEMI.  Patient is on Metoprolol Succinate 25 mg po Daily, Isosorbide  Mononitrate 30 mg po Daily; Holding Rosuvastatin  given abnormal LFTs. Currently on ASA 81 mg po BID for DVT prophylaxis per Orthopedic Surgery.   Essential Hypertension: Uncontrolled on admission C/w Metoprolol Succinate 25 mg po Daily and Isosorbide  Mononitrate 30 mg po Daily. C/w IV Hydralazine  10 mg q4hprn HBP and SBP >160. CTM BP per Protcol. Last BP reading is now 120/68   History of Abdominal Aortic Aneurysm: Patient is status post recent infrarenal endovascular aortic repair with coil of the left internal iliac artery; Will need outpt follow up w/ Vascular Surgery.  AKI on CKD Stage 3a: Baseline Cr of about 1.3-1.4. This admission, creatinine up to a peak of 2.57 but now at baseline. BUN/Cr Trend is stabilized and is now 31/1.46. Worsened with initial IV fluids. Urine output quantity not documented. Blood pressure appears to have been adequate during surgery without evidence of significant hypotension. -Renal ultrasound significant for evidence of medical renal disease and bladder suggestive of history of retention. Creatinine has reached peak and is trending down. -C/w Strict I's and O's and CTM UOP. Avoid Nephrotoxic Medications, Contrast Dyes, Hypotension and Dehydration to Ensure Adequate Renal Perfusion and will need to Renally Adjust Meds. CTM & Trend Renal Function carefully & repeat CMP in the AM   Fever: Unclear Etiology but is improved; afebrile in the last 24 hours but last Tmax was 99.2 blood Cx x2 showing NGTD @ 3 Days; DG Chest showed no active Disease. U/A Negative. CTM and Trend and Follow Cx's  Hypophosphatemia: Phos Lvl is now 2.8. CTM and Replete as Necessary. Repeat Phos in the AM   Chronic Microcytic Anemia: Likely anemia of chronic disease secondary to known CKD. Baseline hemoglobin of around 9-10. Continued to drift down post-operatively to a low of 7.3  and has stabilized. Hgb/Hct Trend fluctuating but fairly stable Last Hgb/Hct is now 7.5/22.6 w/ MCV of 78.6. Anemia panel done and showed an iron of 26, UIBC 106, TIBC 132, saturation 55%, ferritin 597; FOBT is Negative. CTM for S/Sx of Bleeding; No overt bleeding noted. Repeat CBC in the AM  Hypoalbuminemia: Patient's Albumin  Lvl ranging from 1.7-3.2. (Was 1.8 this AM). CTM & Trend & repeat CMP in the AM  Severe Malnutrition in the Context of Chronic illness: Nutrition Status: Nutrition Problem: Severe Malnutrition Etiology: chronic illness Signs/Symptoms: severe muscle depletion, severe fat depletion Interventions: Refer to RD note for recommendations; Calorie count was done and patient failed but 90% of his needs are being met by TF (Nocturnal TF via Cortrak of Osmolite 1.5 @ 60 mL/hr x 12 hours and 100 mL of FFW q6h); Calorie Count reinitiated and per Nursing and the nutritionist he is doing remarkably well so Cortrak is going to be removed and will be placed on Ensure Plus high-protein p.o. 3 times daily and Magic cup 3 times daily with meals   DVT prophylaxis: SCDs Start: 01/13/24 1538    Code Status: Full Code Family Communication: No family present @ bedside   Disposition Plan:  Level of care: Progressive Status is: Inpatient Remains inpatient appropriate because: He is further clinical improvement and clearance by the specialist.  PT OT recommending SNF   Consultants:  Gastroenterology Neurology Orthopedic  Surgery  Procedures:  As delineated as above  Antimicrobials:  Anti-infectives (From admission, onward)    Start     Dose/Rate Route Frequency Ordered Stop   01/15/24 0600  ceFAZolin  (ANCEF ) IVPB 2g/100 mL premix        2 g 200 mL/hr over 30 Minutes Intravenous Every 6 hours 01/15/24 0252 01/15/24 1828   01/14/24 1842  vancomycin  (VANCOCIN ) powder  Status:  Discontinued          As needed 01/14/24 1843 01/14/24 2059   01/14/24 0600  ceFAZolin  (ANCEF ) IVPB 2g/100 mL  premix        2 g 200 mL/hr over 30 Minutes Intravenous On call to O.R. 01/13/24 1527 01/14/24 1743       Subjective: Seen and examined at bedside and is resting and states that he is having some diarrhea overnight.  No nausea or vomiting.  Denies lightheadedness or dizziness.  Feels okay but still feels weak.  Nursing and dietitian states that his oral intake is improved significantly.  No other concerns or complaints this time.  Objective: Vitals:   01/21/24 2318 01/22/24 0310 01/22/24 1215 01/22/24 1528  BP: (!) 140/84  129/76 120/68  Pulse: 78  74 75  Resp: 17  11 20   Temp: 98.3 F (36.8 C) 98 F (36.7 C) 97.9 F (36.6 C) 99.1 F (37.3 C)  TempSrc: Oral Oral Oral Oral  SpO2: 100%  100% 100%  Weight:      Height:        Intake/Output Summary (Last 24 hours) at 01/22/2024 1811 Last data filed at 01/22/2024 1749 Gross per 24 hour  Intake 1546 ml  Output 2000 ml  Net -454 ml   Filed Weights   01/18/24 0409 01/19/24 0500 01/21/24 0500  Weight: 69.4 kg 71.5 kg 70.5 kg   Examination: Physical Exam:  Constitutional: Thin chronically ill-appearing elderly African-American male no acute distress and mentation is improving and appears calm Respiratory: Diminished to auscultation bilaterally with some coarse breath sounds, no wheezing, rales, rhonchi or crackles. Normal respiratory effort and patient is not tachypenic. No accessory muscle use.  Unlabored breathing Cardiovascular: RRR, no murmurs / rubs / gallops. S1 and S2 auscultated.  Mild extremity edema Abdomen: Soft, non-tender, non-distended. Bowel sounds positive.  GU: Deferred. Musculoskeletal: No clubbing / cyanosis of digits/nails. No joint deformity upper and lower extremities. Skin: No rashes, lesions, ulcers on limited skin evaluation. No induration; Warm and dry.  Neurologic: CN 2-12 grossly intact with no focal deficits. Romberg and sign cerebellar reflexes not assessed.  Psychiatric: Awake and alert and appears  calm  Data Reviewed: I have personally reviewed following labs and imaging studies  CBC: Recent Labs  Lab 01/18/24 0221 01/19/24 0548 01/20/24 0419 01/21/24 0540 01/22/24 0237  WBC 6.7 11.0* 8.4 6.5 6.2  NEUTROABS  --   --   --  4.1 3.6  HGB 7.7* 8.5* 8.0* 8.3* 7.5*  HCT 23.6* 26.0* 24.2* 25.3* 22.6*  MCV 78.7* 78.3* 77.6* 78.6* 78.2*  PLT 313 363 336 357 386   Basic Metabolic Panel: Recent Labs  Lab 01/18/24 0221 01/19/24 0548 01/19/24 1647 01/20/24 0419 01/21/24 0540 01/22/24 0237  NA 139 138  --  135 136 136  K 3.8 3.7 3.9 3.9 4.7 5.0  CL 104 101  --  100 101 101  CO2 26 28  --  28 27 27   GLUCOSE 121* 155*  --  205* 106* 146*  BUN 40* 36*  --  32* 28* 31*  CREATININE 1.91* 1.56*  --  1.48* 1.39* 1.46*  CALCIUM  8.2* 8.2*  --  8.1* 8.5* 8.4*  MG 2.1 1.9  --  1.7 1.7 1.7  PHOS 1.3* <1.0* 2.5 1.6* 2.9 2.8   GFR: Estimated Creatinine Clearance: 36.2 mL/min (A) (by C-G formula based on SCr of 1.46 mg/dL (H)). Liver Function Tests: Recent Labs  Lab 01/17/24 1207 01/20/24 0419 01/21/24 0540 01/22/24 0237  AST 50* 453* 931* 630*  ALT 10 173* 393* 351*  ALKPHOS 81 101 105 106  BILITOT 0.6 0.2 0.6 0.5  PROT 6.0* 4.5* 5.7* 5.6*  ALBUMIN  2.0* 1.7*  1.7* 1.7* 1.8*   No results for input(s): LIPASE, AMYLASE in the last 168 hours. Recent Labs  Lab 01/16/24 0926 01/21/24 1013  AMMONIA 13 31   Coagulation Profile: Recent Labs  Lab 01/21/24 1013  INR 1.2   Cardiac Enzymes: No results for input(s): CKTOTAL, CKMB, CKMBINDEX, TROPONINI in the last 168 hours. BNP (last 3 results) No results for input(s): PROBNP in the last 8760 hours. HbA1C: No results for input(s): HGBA1C in the last 72 hours. CBG: Recent Labs  Lab 01/21/24 1940 01/21/24 2316 01/22/24 0309 01/22/24 0724 01/22/24 1217  GLUCAP 153* 178* 178* 190* 175*   Lipid Profile: No results for input(s): CHOL, HDL, LDLCALC, TRIG, CHOLHDL, LDLDIRECT in the last 72  hours. Thyroid  Function Tests: No results for input(s): TSH, T4TOTAL, FREET4, T3FREE, THYROIDAB in the last 72 hours. Anemia Panel: No results for input(s): VITAMINB12, FOLATE, FERRITIN, TIBC, IRON, RETICCTPCT in the last 72 hours. Sepsis Labs: No results for input(s): PROCALCITON, LATICACIDVEN in the last 168 hours.  Recent Results (from the past 240 hours)  Surgical pcr screen     Status: None   Collection Time: 01/14/24  4:47 AM   Specimen: Nasal Mucosa; Nasal Swab  Result Value Ref Range Status   MRSA, PCR NEGATIVE NEGATIVE Final   Staphylococcus aureus NEGATIVE NEGATIVE Final    Comment: (NOTE) The Xpert SA Assay (FDA approved for NASAL specimens in patients 8 years of age and older), is one component of a comprehensive surveillance program. It is not intended to diagnose infection nor to guide or monitor treatment. Performed at [facility], [address], [place], [place] [address]   Culture, blood (Routine X 2) w Reflex to ID Panel     Status: None (Preliminary result)   Collection Time: 01/19/24  9:58 AM   Specimen: BLOOD RIGHT ARM  Result Value Ref Range Status   Specimen Description BLOOD RIGHT ARM  Final   Special Requests   Final    BOTTLES DRAWN AEROBIC AND ANAEROBIC Blood Culture results may not be optimal due to an inadequate volume of blood received in culture bottles   Culture   Final    NO GROWTH 3 DAYS Performed at [facility], [address], [place], [place] [address]    Report Status PENDING  Incomplete  Culture, blood (Routine X 2) w Reflex to ID Panel     Status: None (Preliminary result)   Collection Time: 01/19/24 10:17 AM   Specimen: BLOOD RIGHT HAND  Result Value Ref Range Status   Specimen Description BLOOD RIGHT HAND  Final   Special Requests   Final    BOTTLES DRAWN AEROBIC AND ANAEROBIC Blood Culture results may not be optimal due to an inadequate volume of blood received in culture bottles   Culture    Final    NO GROWTH 3 DAYS Performed at [facility], [address] [place], [place] [address]    Report Status PENDING  Incomplete    Radiology Studies: US  Abdomen Limited RUQ (LIVER/GB) Result Date: 01/21/2024 CLINICAL DATA:  Elevated LFTs. EXAM: ULTRASOUND ABDOMEN LIMITED RIGHT UPPER QUADRANT COMPARISON:  CT scan 11/27/2023. FINDINGS: Gallbladder: No gallstones or wall thickening visualized. No sonographic Murphy sign noted by sonographer. Common bile duct: Diameter: 5 mm Liver: Liver parenchyma is echogenic. Subtle nodular appearance of the liver contour (see images 34 and 31) raises the question of underlying cirrhosis. Portal vein is patent on color Doppler imaging with normal direction of blood flow towards the liver. Other: None. IMPRESSION: 1. Echogenic liver parenchyma with subtle nodular appearance of the liver contour raising the question of underlying cirrhosis. 2. No evidence for cholelithiasis or biliary dilatation. Electronically Signed   By: Camellia Candle M.D.   On: 01/21/2024 06:41   Scheduled Meds:  aspirin   81 mg Per Tube BID   Or   aspirin   300 mg Rectal BID   cholecalciferol  1,000 Units Oral Daily   feeding supplement  237 mL Oral TID BM   Influenza vac split trivalent PF  0.5 mL Intramuscular Tomorrow-1000   insulin aspart  0-9 Units Subcutaneous Q4H   isosorbide  mononitrate  30 mg Oral Daily   metoprolol succinate  25 mg Oral Daily   multivitamin with minerals  1 tablet Oral Daily   pneumococcal 20-valent conjugate vaccine  0.5 mL Intramuscular Tomorrow-1000   thiamine  100 mg Oral Daily   Continuous Infusions:   LOS: 9 days   Alejandro Marker, DO [facility] Available via Epic secure chat 7am-7pm After these hours, please refer to coverage provider listed on [email or web address] 01/22/2024, 6:11 PM

## 2024-01-23 ENCOUNTER — Ambulatory Visit

## 2024-01-23 ENCOUNTER — Ambulatory Visit: Admitting: Neurology

## 2024-01-23 DIAGNOSIS — G928 Other toxic encephalopathy: Secondary | ICD-10-CM | POA: Diagnosis not present

## 2024-01-23 DIAGNOSIS — W19XXXA Unspecified fall, initial encounter: Secondary | ICD-10-CM | POA: Diagnosis not present

## 2024-01-23 DIAGNOSIS — S72001A Fracture of unspecified part of neck of right femur, initial encounter for closed fracture: Secondary | ICD-10-CM | POA: Diagnosis not present

## 2024-01-23 DIAGNOSIS — E43 Unspecified severe protein-calorie malnutrition: Secondary | ICD-10-CM | POA: Diagnosis not present

## 2024-01-23 LAB — CBC WITH DIFFERENTIAL/PLATELET
Abs Immature Granulocytes: 0.03 K/uL (ref 0.00–0.07)
Basophils Absolute: 0.1 K/uL (ref 0.0–0.1)
Basophils Relative: 1 %
Eosinophils Absolute: 0.3 K/uL (ref 0.0–0.5)
Eosinophils Relative: 4 %
HCT: 24.7 % — ABNORMAL LOW (ref 39.0–52.0)
Hemoglobin: 8.1 g/dL — ABNORMAL LOW (ref 13.0–17.0)
Immature Granulocytes: 1 %
Lymphocytes Relative: 28 %
Lymphs Abs: 1.8 K/uL (ref 0.7–4.0)
MCH: 25.6 pg — ABNORMAL LOW (ref 26.0–34.0)
MCHC: 32.8 g/dL (ref 30.0–36.0)
MCV: 77.9 fL — ABNORMAL LOW (ref 80.0–100.0)
Monocytes Absolute: 0.7 K/uL (ref 0.1–1.0)
Monocytes Relative: 11 %
Neutro Abs: 3.6 K/uL (ref 1.7–7.7)
Neutrophils Relative %: 55 %
Platelets: 417 K/uL — ABNORMAL HIGH (ref 150–400)
RBC: 3.17 MIL/uL — ABNORMAL LOW (ref 4.22–5.81)
RDW: 17 % — ABNORMAL HIGH (ref 11.5–15.5)
WBC: 6.6 K/uL (ref 4.0–10.5)
nRBC: 0.5 % — ABNORMAL HIGH (ref 0.0–0.2)

## 2024-01-23 LAB — COMPREHENSIVE METABOLIC PANEL WITH GFR
ALT: 368 U/L — ABNORMAL HIGH (ref 0–44)
AST: 581 U/L — ABNORMAL HIGH (ref 15–41)
Albumin: 1.9 g/dL — ABNORMAL LOW (ref 3.5–5.0)
Alkaline Phosphatase: 112 U/L (ref 38–126)
Anion gap: 8 (ref 5–15)
BUN: 32 mg/dL — ABNORMAL HIGH (ref 8–23)
CO2: 28 mmol/L (ref 22–32)
Calcium: 8.5 mg/dL — ABNORMAL LOW (ref 8.9–10.3)
Chloride: 101 mmol/L (ref 98–111)
Creatinine, Ser: 1.42 mg/dL — ABNORMAL HIGH (ref 0.61–1.24)
GFR, Estimated: 48 mL/min — ABNORMAL LOW (ref >60)
Glucose, Bld: 112 mg/dL — ABNORMAL HIGH (ref 70–99)
Potassium: 5 mmol/L (ref 3.5–5.1)
Sodium: 137 mmol/L (ref 135–145)
Total Bilirubin: 0.2 mg/dL (ref 0.0–1.2)
Total Protein: 6 g/dL — ABNORMAL LOW (ref 6.5–8.1)

## 2024-01-23 LAB — GLUCOSE, CAPILLARY
Glucose-Capillary: 98 mg/dL (ref 70–99)
Glucose-Capillary: 95 mg/dL (ref 70–99)
Glucose-Capillary: 150 mg/dL — ABNORMAL HIGH (ref 70–99)
Glucose-Capillary: 145 mg/dL — ABNORMAL HIGH (ref 70–99)
Glucose-Capillary: 140 mg/dL — ABNORMAL HIGH (ref 70–99)
Glucose-Capillary: 151 mg/dL — ABNORMAL HIGH (ref 70–99)

## 2024-01-23 LAB — CERULOPLASMIN: Ceruloplasmin: 23.9 mg/dL (ref 16.0–31.0)

## 2024-01-23 LAB — ALPHA-1-ANTITRYPSIN: A-1 Antitrypsin, Ser: 267 mg/dL — ABNORMAL HIGH (ref 101–187)

## 2024-01-23 LAB — PHOSPHORUS: Phosphorus: 2.7 mg/dL (ref 2.5–4.6)

## 2024-01-23 LAB — MAGNESIUM: Magnesium: 2 mg/dL (ref 1.7–2.4)

## 2024-01-23 MED ORDER — ASPIRIN 300 MG RE SUPP
300.0000 mg | Freq: Two times a day (BID) | RECTAL | Status: DC
  Filled 2024-01-23: qty 1

## 2024-01-23 MED ORDER — ASPIRIN 81 MG PO CHEW
81.0000 mg | CHEWABLE_TABLET | Freq: Two times a day (BID) | ORAL | Status: DC
  Administered 2024-01-24 – 2024-01-28 (×9): 81 mg via ORAL
  Filled 2024-01-23 (×9): qty 1

## 2024-01-23 NOTE — Progress Notes (Signed)
 Occupational Therapy Treatment Patient Details Name: Juan Goodwin MRN: [id number] DOB: May 18, 1935 Today's Date: 01/23/2024   History of present illness 88 yo male presents to ED 11/2 with fall x1 week ago, subsequent R hip pain and + head trauma. Pt sustained R femoral neck fx s/p hemiarthroplasty (WBAT, posterior hip precautions) 11/3. Code stroke called post-op, CTH and CTA negative for acute findings, MRI negative. EEG negative. PMH includes AAA  s/p recent infrarenal endovascular aortic repair with coil of the left internal iliac artery on 01/04/2024, CAD, CKD, hypertension, MI, SDH, duodenal ulcer.   OT comments  Patient supine in bed and agreeable to OT/PT session with encouragement.  Pt soiled in urine (due to primo fit unplugged), min assist to change gown and mod assist +2 safety to sit on EOB. Mod assist +2 to stand from EOB today 2x, cueing for sequencing and technique to protect R hip. Total assist +2 for LB ADLs.   Poor recall of adherence to hip precautions functionally but able to recall precautions today.  Continue to recommend <3hrs/day inpatient setting at dc. Will follow acutely.       If plan is discharge home, recommend the following:  Assistance with cooking/housework;Direct supervision/assist for financial management;Assist for transportation;Help with stairs or ramp for entrance;A lot of help with walking and/or transfers;A lot of help with bathing/dressing/bathroom;Assistance with feeding;Direct supervision/assist for medications management;Supervision due to cognitive status   Equipment Recommendations  BSC/3in1;Other (comment) (defer)    Recommendations for Other Services      Precautions / Restrictions Precautions Precautions: Fall;Posterior Hip Precaution Booklet Issued: Yes (comment) Recall of Precautions/Restrictions: Impaired Precaution/Restrictions Comments: pt recalled hip precautions during session, but requires cueing to adhere to functionally Required  Braces or Orthoses:  (abduction pillow in bed) Restrictions Weight Bearing Restrictions Per Provider Order: Yes RLE Weight Bearing Per Provider Order: Weight bearing as tolerated       Mobility Bed Mobility Overal bed mobility: Needs Assistance Bed Mobility: Supine to Sit     Supine to sit: Mod assist, +2 for safety/equipment Sit to supine: Mod assist, +2 for safety/equipment   General bed mobility comments: pt needing cueing for technique and safety, physical assist for R LE and hips toward EOB.  Returns to bed with assist for R LE    Transfers Overall transfer level: Needs assistance Equipment used: Rolling walker (2 wheels) Transfers: Sit to/from Stand, Bed to chair/wheelchair/BSC Sit to Stand: Mod assist, +2 physical assistance           General transfer comment: stood from EOB x 2, not elevated today, needing mod assist +2 to power up and steady.  Cueing for R LE placement during transitions for hip precautions.     Balance Overall balance assessment: Needs assistance Sitting-balance support: No upper extremity supported, Feet supported Sitting balance-Leahy Scale: Fair Sitting balance - Comments: close supervision statically Postural control: Posterior lean Standing balance support: Bilateral upper extremity supported, Reliant on assistive device for balance Standing balance-Leahy Scale: Poor Standing balance comment: min to contact guard with BUE support                           ADL either performed or assessed with clinical judgement   ADL Overall ADL's : Needs assistance/impaired     Grooming: Set up;Sitting           Upper Body Dressing : Minimal assistance;Sitting   Lower Body Dressing: Total assistance;+2 for physical assistance;Sit to/from stand Lower Body  Dressing Details (indicate cue type and reason): requires assist for socks, mod assist +2 to stand and relies on BUE Support Toilet Transfer: Moderate assistance;+2 for physical  assistance;Rolling walker (2 wheels) Toilet Transfer Details (indicate cue type and reason): simulated side stepping to [facility]         Functional mobility during ADLs: Moderate assistance;+2 for physical assistance;Rolling walker (2 wheels);Cueing for sequencing;Cueing for safety      Extremity/Trunk Assessment              Vision       Perception     Praxis     Communication Communication Communication: Impaired Factors Affecting Communication: Hearing impaired   Cognition Arousal: Alert Behavior During Therapy: Flat affect Cognition: Cognition impaired     Awareness: Intellectual awareness intact, Online awareness impaired Memory impairment (select all impairments): Working civil service fast streamer, Short-term memory Attention impairment (select first level of impairment): Sustained attention Executive functioning impairment (select all impairments): Sequencing, Problem solving, Organization OT - Cognition Comments: pt soiled with urine in bed (primo fit disconnected but pt not concerned),  pt able to recall hip precautions but poor recall of fucntional adherance to during mobility and ADLs.                 Following commands: Impaired Following commands impaired: Follows one step commands with increased time, Follows multi-step commands inconsistently      Cueing   Cueing Techniques: Verbal cues, Tactile cues  Exercises      Shoulder Instructions       General Comments VSS on RA    Pertinent Vitals/ Pain       Pain Assessment Pain Assessment: Faces Faces Pain Scale: Hurts little more Pain Location: R hip Pain Descriptors / Indicators: Sore Pain Intervention(s): Monitored during session, Limited activity within patient's tolerance, Repositioned  Home Living                                          Prior Functioning/Environment              Frequency  Min 2X/week        Progress Toward Goals  OT Goals(current goals can now be found  in the care plan section)  Progress towards OT goals: Progressing toward goals  Acute Rehab OT Goals Patient Stated Goal: get better OT Goal Formulation: With patient Time For Goal Achievement: 01/30/24 Potential to Achieve Goals: Good  Plan      Co-evaluation    PT/OT/SLP Co-Evaluation/Treatment: Yes Reason for Co-Treatment: Complexity of the patient's impairments (multi-system involvement);To address functional/ADL transfers;For patient/therapist safety PT goals addressed during session: Mobility/safety with mobility;Balance;Strengthening/ROM;Proper use of DME OT goals addressed during session: ADL's and self-care      AM-PAC OT 6 Clicks Daily Activity     Outcome Measure   Help from another person eating meals?: A Little Help from another person taking care of personal grooming?: A Little Help from another person toileting, which includes using toliet, bedpan, or urinal?: Total Help from another person bathing (including washing, rinsing, drying)?: A Lot Help from another person to put on and taking off regular upper body clothing?: A Little Help from another person to put on and taking off regular lower body clothing?: Total 6 Click Score: 13    End of Session Equipment Utilized During Treatment: Gait belt;Rolling walker (2 wheels)  OT Visit Diagnosis: Other abnormalities of gait  and mobility (R26.89);Muscle weakness (generalized) (M62.81);Other symptoms and signs involving cognitive function;History of falling (Z91.81)   Activity Tolerance Patient tolerated treatment well   Patient Left in bed;with call bell/phone within reach;with bed alarm set;with nursing/sitter in room   Nurse Communication Mobility status;Precautions        Time: 9055-8992 OT Time Calculation (min): 23 min  Charges: OT General Charges $OT Visit: 1 Visit OT Treatments $Self Care/Home Management : 8-22 mins  Etta NOVAK, OT Acute Rehabilitation Services Office [phone] Secure Chat  Preferred    Etta GORMAN Hope 01/23/2024, 10:49 AM

## 2024-01-23 NOTE — [place] FL2 (Signed)
 Juan Goodwin  MEDICAID FL2 LEVEL OF CARE FORM     IDENTIFICATION  Patient Name: Juan Goodwin Birthdate: 02-Feb-1936 Sex: male Admission Date (Current Location): 01/13/2024  [facility] and [place] Number:  Producer, Television/film/video and Address:  [facility] [place], [address] [place], [place] [address]      Provider Number: [id number]  Attending Physician Name and Address:  Sherrill Alejandro Donovan, DO  Relative Name and Phone Number:       Current Level of Care: Hospital Recommended Level of Care: Skilled Nursing Facility Prior Approval Number:    Date Approved/Denied:   PASRR Number: [id number]  Discharge Plan: SNF    Current Diagnoses: Patient Active Problem List   Diagnosis Date Noted   Toxic metabolic encephalopathy 01/15/2024   Protein-calorie malnutrition, severe 01/14/2024   Closed right hip fracture (HCC) 01/14/2024   Fall 01/13/2024   Closed displaced fracture of right femoral neck (HCC) 01/13/2024   Type 2 diabetes mellitus with diabetic chronic kidney disease (HCC) 01/09/2024   Stage 3 chronic kidney disease (HCC) 01/09/2024   Abdominal aortic aneurysm (AAA) greater than 5.5 cm in diameter in male 01/04/2024   Deficiency anemia 11/06/2023   Sarcopenia 11/06/2023   Chronic constipation 11/06/2023   Aortic atherosclerosis    Ascending aorta dilatation    CAD (coronary artery disease) 10/12/2020   Angina at rest 10/11/2020   Elevated troponin    Chest pain 04/25/2020   NSTEMI (non-ST elevated myocardial infarction) (HCC) 04/25/2020   Hyperlipidemia 08/08/2019   Cerebrovascular disease 10/08/2018   REM sleep behavior disorder 10/08/2018   Weakness 04/10/2017   Dehydration 04/10/2017   Acute kidney injury superimposed on chronic kidney disease 04/10/2017   Normocytic normochromic anemia 04/10/2017   Chronic kidney disease (CKD), stage III (moderate) (HCC) 03/30/2017   HTN (hypertension), malignant 03/29/2017   Orthostatic hypotension 03/27/2017    Global aphasia 03/27/2017   Subdural hematoma (HCC) 03/26/2017   Acute pyloric channel ulcer 07/13/2014   Hypokalemia 07/13/2014   Acute upper GI bleed 07/13/2014   ARF (acute renal failure) 07/13/2014   Hypernatremia 07/13/2014   Hypotension 07/13/2014   Thrombocytopenia 07/13/2014   Helicobacter pylori gastritis 07/13/2014   Gastrointestinal hemorrhage with melena    Melena 07/11/2014   GI bleed 07/11/2014   Acute blood loss anemia 07/10/2014    Orientation RESPIRATION BLADDER Height & Weight     Self  Normal Incontinent Weight: 155 lb 6.8 oz (70.5 kg) Height:  6' 1 (185.4 cm)  BEHAVIORAL SYMPTOMS/MOOD NEUROLOGICAL BOWEL NUTRITION STATUS      Continent Diet (please see discharge summary)  AMBULATORY STATUS COMMUNICATION OF NEEDS Skin   Extensive Assist Verbally Surgical wounds (closed surgibcal incision RT Leg, closed incision RT and LF groin)                       Personal Care Assistance Level of Assistance  Bathing, Feeding, Dressing Bathing Assistance: Maximum assistance Feeding assistance: Limited assistance Dressing Assistance: Maximum assistance     Functional Limitations Info  Sight, Hearing, Speech Sight Info: Adequate Hearing Info: Impaired Recruitment Consultant) Speech Info: Adequate    SPECIAL CARE FACTORS FREQUENCY  PT (By licensed PT), OT (By licensed OT)     PT Frequency: 5x per week OT Frequency: 5x per week            Contractures Contractures Info: Not present    Additional Factors Info  Code Status, Allergies Code Status Info: FULL Allergies Info: Cardura (  doxazosin Mesylate) Not Specified Intolerance Other (See Comments) Dizziness Syncope           Current Medications (01/23/2024):  This is the current hospital active medication list Current Facility-Administered Medications  Medication Dose Route Frequency Provider Last Rate Last Admin   aspirin  chewable tablet 81 mg  81 mg Per Tube BID Tanda Powell ORN, RPH   81 mg at  01/23/24 1002   Or   aspirin  suppository 300 mg  300 mg Rectal BID Tanda Powell ORN, RPH       bisacodyl (DULCOLAX) EC tablet 5 mg  5 mg Oral Daily PRN Moore, Michael A, MD       cholecalciferol (VITAMIN D3) 25 MCG (1000 UNIT) tablet 1,000 Units  1,000 Units Oral Daily Briana Elgin LABOR, MD   1,000 Units at 01/23/24 1002   feeding supplement (ENSURE PLUS HIGH PROTEIN) liquid 237 mL  237 mL Oral TID BM SheikhAlejandro Latif, DO   237 mL at 01/23/24 1002   hydrALAZINE  (APRESOLINE ) injection 10 mg  10 mg Intravenous Q4H PRN Moore, Michael A, MD   10 mg at 01/16/24 1357   HYDROcodone-acetaminophen  (NORCO/VICODIN) 5-325 MG per tablet 1-2 tablet  1-2 tablet Oral Q4H PRN Moore, Michael A, MD   2 tablet at 01/16/24 1700   Influenza vac split trivalent PF (FLUZONE HIGH-DOSE) injection 0.5 mL  0.5 mL Intramuscular Tomorrow-1000 Patel, Ekta V, MD       insulin aspart (novoLOG) injection 0-9 Units  0-9 Units Subcutaneous Q4H Briana Elgin LABOR, MD   2 Units at 01/22/24 2340   isosorbide  mononitrate (IMDUR ) 24 hr tablet 30 mg  30 mg Oral Daily Moore, Michael A, MD   30 mg at 01/23/24 1002   menthol (CEPACOL) lozenge 3 mg  1 lozenge Oral PRN Sheikh, Omair Latif, DO       metoprolol succinate (TOPROL-XL) 24 hr tablet 25 mg  25 mg Oral Daily Moore, Michael A, MD   25 mg at 01/23/24 1002   multivitamin with minerals tablet 1 tablet  1 tablet Oral Daily Briana Elgin LABOR, MD   1 tablet at 01/23/24 1002   phenol (CHLORASEPTIC) mouth spray 1 spray  1 spray Mouth/Throat PRN Sheikh, Omair Latif, DO   1 spray at 01/21/24 1850   pneumococcal 20-valent conjugate vaccine (PREVNAR 20) injection 0.5 mL  0.5 mL Intramuscular Tomorrow-1000 Patel, Ekta V, MD       polyethylene glycol (MIRALAX / GLYCOLAX) packet 17 g  17 g Oral Daily PRN Moore, Michael A, MD   17 g at 01/17/24 1444     Discharge Medications: Please see discharge summary for a list of discharge medications.  Relevant Imaging Results:  Relevant Lab  Results:   Additional Information SSN [id number]  Juan LOISE Louder, LCSW

## 2024-01-23 NOTE — Progress Notes (Signed)
 Orthopedic Surgery Progress Note   Assessment: Patient is a 88 y.o. male with right femoral neck fracture s/p hemiarthroplasty   Plan: -Operative plans: complete -Can leave incision open to air -Okay to let soap/water run over incision, but do not submerge -DVT ppx: aspirin  81mg  BID -Weight bearing status: WBAT RLE with posterior hip precautions -Abduction pillow when in bed -PT evaluate and treat -Pain control -Dispo: per primary  ___________________________________________________________________________  Subjective: No acute events overnight. Pain well controlled. Eating breakfast. No complaints this morning.    Physical Exam:  General: no acute distress, appears stated age Neurologic: alert, following commands Respiratory: unlabored breathing on room air, symmetric chest rise  MSK:   -Right lower extremity  Dressing over hip c/d/i EHL/TA/GSC intact Plantarflexes and dorsiflexes toes Sensation intact to light touch in sural, saphenous, tibial, deep peroneal, and superficial peroneal nerve distributions Foot warm and well perfused   Yesterday's total administered Morphine  Milligram Equivalents: 0   Patient name: Juan Goodwin Patient MRN: [id number] Date: 01/23/24

## 2024-01-23 NOTE — Progress Notes (Signed)
 Coretrak removed without issue.

## 2024-01-23 NOTE — Plan of Care (Signed)
 Problem: Education: Goal: Knowledge of General Education information will improve Description: Including pain rating scale, medication(s)/side effects and non-pharmacologic comfort measures Outcome: Progressing   Problem: Health Behavior/Discharge Planning: Goal: Ability to manage health-related needs will improve Outcome: Progressing   Problem: Clinical Measurements: Goal: Ability to maintain clinical measurements within normal limits will improve Outcome: Progressing Goal: Will remain free from infection Outcome: Progressing Goal: Diagnostic test results will improve Outcome: Progressing Goal: Respiratory complications will improve Outcome: Progressing Goal: Cardiovascular complication will be avoided Outcome: Progressing   Problem: Activity: Goal: Risk for activity intolerance will decrease Outcome: Progressing   Problem: Nutrition: Goal: Adequate nutrition will be maintained Outcome: Progressing   Problem: Coping: Goal: Level of anxiety will decrease Outcome: Progressing   Problem: Elimination: Goal: Will not experience complications related to bowel motility Outcome: Progressing Goal: Will not experience complications related to urinary retention Outcome: Progressing   Problem: Pain Managment: Goal: General experience of comfort will improve and/or be controlled Outcome: Progressing   Problem: Safety: Goal: Ability to remain free from injury will improve Outcome: Progressing   Problem: Skin Integrity: Goal: Risk for impaired skin integrity will decrease Outcome: Progressing   Problem: Education: Goal: Required Educational Video(s) Outcome: Progressing   Problem: Clinical Measurements: Goal: Postoperative complications will be avoided or minimized Outcome: Progressing   Problem: Skin Integrity: Goal: Demonstration of wound healing without infection will improve Outcome: Progressing   Problem: Delirium Goal: STG: [place]'s ability to perform  activities of daily living will improve Outcome: Progressing Goal: STG: Iori's ability to verbalize feelings about their condition will improve Outcome: Progressing   Problem: Education: Goal: Knowledge of disease or condition will improve Outcome: Progressing Goal: Knowledge of secondary prevention will improve (MUST DOCUMENT ALL) Outcome: Progressing Goal: Knowledge of patient specific risk factors will improve (DELETE if not current risk factor) Outcome: Progressing   Problem: Ischemic Stroke/TIA Tissue Perfusion: Goal: Complications of ischemic stroke/TIA will be minimized Outcome: Progressing   Problem: Coping: Goal: Will verbalize positive feelings about self Outcome: Progressing Goal: Will identify appropriate support needs Outcome: Progressing   Problem: Health Behavior/Discharge Planning: Goal: Ability to manage health-related needs will improve Outcome: Progressing Goal: Goals will be collaboratively established with patient/family Outcome: Progressing   Problem: Self-Care: Goal: Ability to participate in self-care as condition permits will improve Outcome: Progressing Goal: Verbalization of feelings and concerns over difficulty with self-care will improve Outcome: Progressing Goal: Ability to communicate needs accurately will improve Outcome: Progressing   Problem: Nutrition: Goal: Risk of aspiration will decrease Outcome: Progressing Goal: Dietary intake will improve Outcome: Progressing   Problem: Education: Goal: Verbalization of understanding the information provided (i.e., activity precautions, restrictions, etc) will improve Outcome: Progressing   Problem: Activity: Goal: Ability to ambulate and perform ADLs will improve Outcome: Progressing   Problem: Clinical Measurements: Goal: Postoperative complications will be avoided or minimized Outcome: Progressing   Problem: Self-Concept: Goal: Ability to maintain and perform role responsibilities to  the fullest extent possible will improve Outcome: Progressing   Problem: Pain Management: Goal: Pain level will decrease Outcome: Progressing   Problem: Education: Goal: Ability to describe self-care measures that may prevent or decrease complications (Diabetes Survival Skills Education) will improve Outcome: Progressing Goal: Individualized Educational Video(s) Outcome: Progressing   Problem: Coping: Goal: Ability to adjust to condition or change in health will improve Outcome: Progressing   Problem: Fluid Volume: Goal: Ability to maintain a balanced intake and output will improve Outcome: Progressing   Problem: Health Behavior/Discharge Planning: Goal: Ability to identify and  utilize available resources and services will improve Outcome: Progressing Goal: Ability to manage health-related needs will improve Outcome: Progressing   Problem: Metabolic: Goal: Ability to maintain appropriate glucose levels will improve Outcome: Progressing   Problem: Nutritional: Goal: Maintenance of adequate nutrition will improve Outcome: Progressing Goal: Progress toward achieving an optimal weight will improve Outcome: Progressing   Problem: Skin Integrity: Goal: Risk for impaired skin integrity will decrease Outcome: Progressing   Problem: Tissue Perfusion: Goal: Adequacy of tissue perfusion will improve Outcome: Progressing

## 2024-01-23 NOTE — Progress Notes (Signed)
 Physical Therapy Treatment Patient Details Name: Juan Goodwin MRN: [id number] DOB: 03-06-1936 Today's Date: 01/23/2024   History of Present Illness 88 yo male presents to ED 11/2 with fall x1 week ago, subsequent R hip pain and + head trauma. Pt sustained R femoral neck fx s/p hemiarthroplasty (WBAT, posterior hip precautions) 11/3. Code stroke called post-op, CTH and CTA negative for acute findings, MRI negative. EEG negative. PMH includes AAA  s/p recent infrarenal endovascular aortic repair with coil of the left internal iliac artery on 01/04/2024, CAD, CKD, hypertension, MI, SDH, duodenal ulcer.    PT Comments  Patient initially not wanting to get OOB, however noted bed to be wet and he agreed to get OOB for clean sheets to be placed. Able to verbalize hip precautions (somewhat in his own words), and continues to need assist to adhere to precautions. Did not elevate bed today for sit to stand with pt requiring incr assist (+2 mod) x 2 reps. Refused OOB to chair and assisted back to bed. Patient will benefit from continued inpatient follow up therapy, <3 hours/day    If plan is discharge home, recommend the following: Two people to help with walking and/or transfers;Two people to help with bathing/dressing/bathroom;Assistance with feeding;Assistance with cooking/housework;Direct supervision/assist for medications management;Direct supervision/assist for financial management;Assist for transportation   Can travel by private vehicle     No  Equipment Recommendations  Rolling walker (2 wheels);Wheelchair (measurements PT);Wheelchair cushion (measurements PT)    Recommendations for Other Services       Precautions / Restrictions Precautions Precautions: Fall;Posterior Hip Precaution Booklet Issued: Yes (comment) Recall of Precautions/Restrictions: Impaired Precaution/Restrictions Comments: pt recalled hip precautions during session, but requires cueing to adhere to functionally Required  Braces or Orthoses:  (abduction pillow in bed) Restrictions Weight Bearing Restrictions Per Provider Order: Yes RLE Weight Bearing Per Provider Order: Weight bearing as tolerated     Mobility  Bed Mobility Overal bed mobility: Needs Assistance Bed Mobility: Supine to Sit     Supine to sit: Mod assist, +2 for safety/equipment Sit to supine: Mod assist, +2 for safety/equipment   General bed mobility comments: pt needing cueing for technique and safety, physical assist for R LE and hips toward EOB.  Returns to bed with assist for R LE    Transfers Overall transfer level: Needs assistance Equipment used: Rolling walker (2 wheels) Transfers: Sit to/from Stand, Bed to chair/wheelchair/BSC Sit to Stand: Mod assist, +2 physical assistance   Step pivot transfers: Min assist, +2 safety/equipment       General transfer comment: stood from EOB x 2, not elevated today, needing mod assist +2 to power up and steady.  Cueing for R LE placement during transitions for hip precautions.    Ambulation/Gait Ambulation/Gait assistance: Min assist   Assistive device: Rolling walker (2 wheels) Gait Pattern/deviations: Step-to pattern Gait velocity: reduced     General Gait Details: pt refused other than side steps up toward [facility]   Stairs             Wheelchair Mobility     Tilt Bed    Modified Rankin (Stroke Patients Only)       Balance Overall balance assessment: Needs assistance Sitting-balance support: No upper extremity supported, Feet supported Sitting balance-Leahy Scale: Fair Sitting balance - Comments: close supervision statically Postural control: Posterior lean Standing balance support: Bilateral upper extremity supported, Reliant on assistive device for balance Standing balance-Leahy Scale: Poor Standing balance comment: min to contact guard with BUE support  Communication Communication Communication: Impaired Factors  Affecting Communication: Hearing impaired  Cognition Arousal: Alert Behavior During Therapy: Flat affect   PT - Cognitive impairments: No family/caregiver present to determine baseline   Orientation impairments: Time                   PT - Cognition Comments: Rt gaze preference, slow to process Following commands: Impaired Following commands impaired: Follows one step commands with increased time, Follows multi-step commands inconsistently    Cueing Cueing Techniques: Verbal cues, Tactile cues  Exercises General Exercises - Lower Extremity Heel Slides: AAROM, Right, 5 reps    General Comments General comments (skin integrity, edema, etc.): VSS      Pertinent Vitals/Pain Pain Assessment Pain Assessment: Faces Faces Pain Scale: Hurts little more Breathing: normal Negative Vocalization: none Facial Expression: smiling or inexpressive Body Language: relaxed Consolability: no need to console PAINAD Score: 0 Pain Location: R hip Pain Descriptors / Indicators: Sore Pain Intervention(s): Limited activity within patient's tolerance, Monitored during session    Home Living                          Prior Function            PT Goals (current goals can now be found in the care plan section) Acute Rehab PT Goals Patient Stated Goal: to improve mobility quality PT Goal Formulation: Patient unable to participate in goal setting Time For Goal Achievement: 01/30/24 Potential to Achieve Goals: Fair Progress towards PT goals: Progressing toward goals    Frequency    Min 2X/week      PT Plan      Co-evaluation PT/OT/SLP Co-Evaluation/Treatment: Yes Reason for Co-Treatment: Complexity of the patient's impairments (multi-system involvement);To address functional/ADL transfers;For patient/therapist safety PT goals addressed during session: Mobility/safety with mobility;Balance;Strengthening/ROM;Proper use of DME OT goals addressed during session: ADL's and  self-care      AM-PAC PT 6 Clicks Mobility   Outcome Measure  Help needed turning from your back to your side while in a flat bed without using bedrails?: A Lot Help needed moving from lying on your back to sitting on the side of a flat bed without using bedrails?: Total Help needed moving to and from a bed to a chair (including a wheelchair)?: A Lot Help needed standing up from a chair using your arms (e.g., wheelchair or bedside chair)?: A Lot Help needed to walk in hospital room?: Total Help needed climbing 3-5 steps with a railing? : Total 6 Click Score: 9    End of Session Equipment Utilized During Treatment: Gait belt Activity Tolerance: Patient limited by fatigue Patient left: with call bell/phone within reach;in bed;with bed alarm set Nurse Communication: Mobility status PT Visit Diagnosis: Other abnormalities of gait and mobility (R26.89);History of falling (Z91.81);Muscle weakness (generalized) (M62.81);Difficulty in walking, not elsewhere classified (R26.2)     Time: 9055-8992 PT Time Calculation (min) (ACUTE ONLY): 23 min  Charges:    $Therapeutic Activity: 8-22 mins PT General Charges $$ ACUTE PT VISIT: 1 Visit                      Macario RAMAN, PT Acute Rehabilitation Services  Office [phone]    Macario SHAUNNA Soja 01/23/2024, 11:19 AM

## 2024-01-23 NOTE — Progress Notes (Signed)
 PROGRESS NOTE    Juan Goodwin  FMW:[id number] DOB: Mar 06, 1936 DOA: 01/13/2024 PCP: [name], MD   Brief Narrative:  Juan Goodwin is a 88 y.o. male with a history of AAA, CAD, CKD stage IIIa, subdural hematoma, duodenal ulcer, CAD, hyperlipidemia, primary pretension.  Patient presented secondary to a fall and resultant right femoral neck fracture.  Orthopedic surgery was consulted and performed a right hip hemiarthroplasty on 11/3.  Hospitalization complicated by resultant metabolic encephalopathy of unknown etiology.  Had a temperature a few days ago but this is resolved.  LFTs started worsening so GI was consulted and feel there is ischemic hepatitis and they feel that this is reassuring and self-limited and given his slow improvement in LFTs.  Cortrak has been removed and will continue to monitor his oral intake.  He is getting close to being medically stable for discharge given his improvement.  PT/OT recommending SNF and he has been faxed out by the [facility] team awaiting bed offers and insurance authorization  Assessment and Plan:  Right Femoral Neck Fracture: Secondary to fall. Orthopedic surgery consulted and performed a right hemiarthroplasty on 11/3.  Orthopedic surgery recommendation for weightbearing as tolerated regarding right lower extremity with posterior hip precautions in addition to aspirin  81 mg twice daily for DVT prophylaxis.  Orthopedic surgery is also recommending abduction pillow when in bed and continuing pain control and leave the incision open to air.  They are recommending that that is okay for the soap and water run over her incision but not for submersion. C/w Bowel Regimen and Pain Control.  PT/OT recommending SNF and now will start pursuing given LFTs improving given that he also has Cortrak removed.    Acute Metabolic Encephalopathy: Presumed diagnosis, although unclear etiology and is improving. Symptoms occurred post-surgery. CT head and MRI brain without  evidence of acute intracranial process. EEG obtained and was negative for evidence of seizure activity. Some worsening renal function with BUN up to 32. TSH, Vitamin B12, folate, ammonia level do not explain symptoms (Ammonia Lvl was 31). Mentation continues to improve slowly. Continue to Maintain Delirium Precautions    Dysphagia and poor po intake: Secondary to mental status change.  Patient was seen and evaluated by speech therapy who initially recommended n.p.o. status but now on D3 after SLP re-evaluation.  Failed his calorie count over the weekend but is doing markedly well today so we will remove his core track and repeat calorie count. -Dietitian recommendations (11/11): Encourage PO intake: Nursing to assist with meals Room service with assist  Went over preordering meals with daughter at bedside Increase Ensure Plus High Protein po to TID, each supplement provides 350 kcal and 20 grams of protein Continue Magic cup TID with meals, each supplement provides 290 kcal and 9 grams of protein 100 mg Thiamine x 7 days po MVI with minerals daily po 1,000 units vitamin D  daily po Discontinue Calorie count Remove cortrak tube, discontinue tube feeds   Hyponatremia: Mild and Resolved. Na+ was 137 on last check. CTM & Trend & repeat CMP in the AM   Abnormal/Elevated LFTs in the setting of ? Liver Cirrhosis: Initially mild but now worsening significantly. Unclear etiology. AST and ALT Trend now improving:  Recent Labs  Lab 01/13/24 1954 01/15/24 0251 01/17/24 1207 01/20/24 0419 01/21/24 0540 01/22/24 0237 01/23/24 0258  AST 103* 88* 50* 453* 931* 630* 581*  ALT 76* 69* 10 173* 393* 351* 368*  -Checked RUQ U/S and it showed Echogenic liver parenchyma  with subtle nodular appearance of the liver contour raising the question of underlying cirrhosis and  No evidence for cholelithiasis or biliary dilatation.  Acute Hepatitis Panel Non-Reactive.  -Ammonia Lvl normal at 31. GI was consulted for  further evaluation and they are recommending continue to monitor LFTs and complete workup with AMA was <20.0, alpha-1 antitrypsin was 267 and ceruloplasmin was 23.9; ASMA pending F-Actin IgG was 14. GI feels it may may be related to ischemic hepatitis but labs are trending down and reassuring.  GI feels that this is self-limited and should resolve on its own and recommending repeating LFTs in the a.m.  History of CAD: Noted.  Patient with prior NSTEMI.  Patient is on Metoprolol Succinate 25 mg po Daily, Isosorbide  Mononitrate 30 mg po Daily; Holding Rosuvastatin  given abnormal LFTs. Currently on ASA 81 mg po BID for DVT prophylaxis per Orthopedic Surgery.   Essential Hypertension: Uncontrolled on admission C/w Metoprolol Succinate 25 mg po Daily and Isosorbide  Mononitrate 30 mg po Daily. C/w IV Hydralazine  10 mg q4hprn HBP and SBP >160. CTM BP per Protcol. Last BP reading is now a little soft at 100/64   History of Abdominal Aortic Aneurysm: Patient is status post recent infrarenal endovascular aortic repair with coil of the left internal iliac artery; Will need outpt follow up w/ Vascular Surgery.  AKI on CKD Stage 3a: Baseline Cr of about 1.3-1.4. This admission, creatinine up to a peak of 2.57 but now at baseline. BUN/Cr Trend is stabilized and is now 32/1.42. Worsened with initial IV fluids. Urine output quantity not documented. Blood pressure appears to have been adequate during surgery without evidence of significant hypotension. -Renal ultrasound significant for evidence of medical renal disease and bladder suggestive of history of retention. Creatinine has reached peak and is trending down. -C/w Strict I's and O's and CTM UOP. Avoid Nephrotoxic Medications, Contrast Dyes, Hypotension and Dehydration to Ensure Adequate Renal Perfusion and will need to Renally Adjust Meds. CTM & Trend Renal Function carefully & repeat CMP in the AM   Fever: Unclear Etiology but is Resolved; afebrile in the last 24  hours but last Tmax was 99.1. Blood Cx x2 showing NGTD @ 4Days; DG Chest showed no active Disease. U/A Negative. CTM and Trend and Follow Cx's  Hypophosphatemia: Phos Lvl is now 2.7. CTM and Replete as Necessary. Repeat Phos in the AM   Chronic Microcytic Anemia: Likely anemia of chronic disease secondary to known CKD. Baseline hemoglobin of around 9-10. Continued to drift down post-operatively to a low of 7.3 and has stabilized. Hgb/Hct Trend fluctuating but fairly stable Last Hgb/Hct is now 8.1/24.7 w/ MCV of 77.9. Anemia panel done and showed an iron of 26, UIBC 106, TIBC 132, saturation 55%, ferritin 597; FOBT is Negative. CTM for S/Sx of Bleeding; No overt bleeding noted. Repeat CBC in the AM  Hypoalbuminemia: Patient's Albumin  Lvl ranging from 1.7-3.2. (Was 1.9 this AM). CTM & Trend & repeat CMP in the AM  Severe Malnutrition in the Context of Chronic illness: Nutrition Status: Nutrition Problem: Severe Malnutrition Etiology: chronic illness Signs/Symptoms: severe muscle depletion, severe fat depletion Interventions: Refer to RD note for recommendations; Calorie count was initally done and patient failed but 90% of his needs are being met by TF (Nocturnal TF via Cortrak of Osmolite 1.5 @ 60 mL/hr x 12 hours and 100 mL of FFW q6h);  -Calorie Count reinitiated and per Nursing and the nutritionist he is doing remarkably well so Cortrak removed 11/12 and will be placed  on Ensure Plus high-protein p.o. 3 times daily and Magic cup 3 times daily with meals   DVT prophylaxis: SCDs Start: 01/13/24 1538    Code Status: Full Code Family Communication: No family currently at bedside  Disposition Plan:  Level of care: Progressive Status is: Inpatient Remains inpatient appropriate because: Medically stable to be discharged to SNF but awaiting bed availability and insurance authorization   Consultants:  Gastroenterology Neurology Orthopedic Surgery  Procedures:  As delineated as  above  Antimicrobials:  Anti-infectives (From admission, onward)    Start     Dose/Rate Route Frequency Ordered Stop   01/15/24 0600  ceFAZolin  (ANCEF ) IVPB 2g/100 mL premix        2 g 200 mL/hr over 30 Minutes Intravenous Every 6 hours 01/15/24 0252 01/15/24 1828   01/14/24 1842  vancomycin  (VANCOCIN ) powder  Status:  Discontinued          As needed 01/14/24 1843 01/14/24 2059   01/14/24 0600  ceFAZolin  (ANCEF ) IVPB 2g/100 mL premix        2 g 200 mL/hr over 30 Minutes Intravenous On call to O.R. 01/13/24 1527 01/14/24 1743       Subjective: Seen and examined at bedside and resting in had his core track removed and happy this is out now.  Denies any lightheadedness or dizziness.  Still feels weak.  States his diarrhea has improved overnight and this is likely from his tube feedings to be discontinued.  No other concerns or complaints at this time.  Objective: Vitals:   01/22/24 2333 01/23/24 0323 01/23/24 0725 01/23/24 1123  BP: 138/72 (!) 140/79  100/64  Pulse: 73 75  77  Resp: 15 17  15   Temp: 98.5 F (36.9 C) 98.2 F (36.8 C)  97.7 F (36.5 C)  TempSrc: Oral Oral Oral Oral  SpO2: 100% 100%  99%  Weight:      Height:        Intake/Output Summary (Last 24 hours) at 01/23/2024 1547 Last data filed at 01/23/2024 1415 Gross per 24 hour  Intake 1586 ml  Output 2500 ml  Net -914 ml   Filed Weights   01/18/24 0409 01/19/24 0500 01/21/24 0500  Weight: 69.4 kg 71.5 kg 70.5 kg   Examination: Physical Exam:  Constitutional: Thin chronically ill-appearing elderly African-American male in no acute distress and appears calm Respiratory: Diminished to auscultation bilaterally, no wheezing, rales, rhonchi or crackles. Normal respiratory effort and patient is not tachypenic. No accessory muscle use.  Unlabored breathing Cardiovascular: RRR, no murmurs / rubs / gallops. S1 and S2 auscultated. No extremity edema.   Abdomen: Soft, non-tender, non-distended. Bowel sounds positive.   GU: Deferred. Musculoskeletal: No clubbing / cyanosis of digits/nails. No joint deformity upper and lower extremities.  Skin: No rashes, lesions, ulcers on a limited skin evaluation. No induration; Warm and dry.  Neurologic: CN 2-12 grossly intact with no focal deficits. Romberg sign and cerebellar reflexes not assessed.  Psychiatric: He is awake and alert and appears calm  Data Reviewed: I have personally reviewed following labs and imaging studies  CBC: Recent Labs  Lab 01/19/24 0548 01/20/24 0419 01/21/24 0540 01/22/24 0237 01/23/24 0258  WBC 11.0* 8.4 6.5 6.2 6.6  NEUTROABS  --   --  4.1 3.6 3.6  HGB 8.5* 8.0* 8.3* 7.5* 8.1*  HCT 26.0* 24.2* 25.3* 22.6* 24.7*  MCV 78.3* 77.6* 78.6* 78.2* 77.9*  PLT 363 336 357 386 417*   Basic Metabolic Panel: Recent Labs  Lab 01/19/24 0548  01/19/24 1647 01/20/24 0419 01/21/24 0540 01/22/24 0237 01/23/24 0258  NA 138  --  135 136 136 137  K 3.7 3.9 3.9 4.7 5.0 5.0  CL 101  --  100 101 101 101  CO2 28  --  28 27 27 28   GLUCOSE 155*  --  205* 106* 146* 112*  BUN 36*  --  32* 28* 31* 32*  CREATININE 1.56*  --  1.48* 1.39* 1.46* 1.42*  CALCIUM  8.2*  --  8.1* 8.5* 8.4* 8.5*  MG 1.9  --  1.7 1.7 1.7 2.0  PHOS <1.0* 2.5 1.6* 2.9 2.8 2.7   GFR: Estimated Creatinine Clearance: 37.2 mL/min (A) (by C-G formula based on SCr of 1.42 mg/dL (H)). Liver Function Tests: Recent Labs  Lab 01/17/24 1207 01/20/24 0419 01/21/24 0540 01/22/24 0237 01/23/24 0258  AST 50* 453* 931* 630* 581*  ALT 10 173* 393* 351* 368*  ALKPHOS 81 101 105 106 112  BILITOT 0.6 0.2 0.6 0.5 0.2  PROT 6.0* 4.5* 5.7* 5.6* 6.0*  ALBUMIN  2.0* 1.7*  1.7* 1.7* 1.8* 1.9*   No results for input(s): LIPASE, AMYLASE in the last 168 hours. Recent Labs  Lab 01/21/24 1013  AMMONIA 31   Coagulation Profile: Recent Labs  Lab 01/21/24 1013  INR 1.2   Cardiac Enzymes: No results for input(s): CKTOTAL, CKMB, CKMBINDEX, TROPONINI in the last 168  hours. BNP (last 3 results) No results for input(s): PROBNP in the last 8760 hours. HbA1C: No results for input(s): HGBA1C in the last 72 hours. CBG: Recent Labs  Lab 01/22/24 1933 01/22/24 2335 01/23/24 0324 01/23/24 0815 01/23/24 1119  GLUCAP 116* 152* 98 95 150*   Lipid Profile: No results for input(s): CHOL, HDL, LDLCALC, TRIG, CHOLHDL, LDLDIRECT in the last 72 hours. Thyroid  Function Tests: No results for input(s): TSH, T4TOTAL, FREET4, T3FREE, THYROIDAB in the last 72 hours. Anemia Panel: No results for input(s): VITAMINB12, FOLATE, FERRITIN, TIBC, IRON, RETICCTPCT in the last 72 hours. Sepsis Labs: No results for input(s): PROCALCITON, LATICACIDVEN in the last 168 hours.  Recent Results (from the past 240 hours)  Surgical pcr screen     Status: None   Collection Time: 01/14/24  4:47 AM   Specimen: Nasal Mucosa; Nasal Swab  Result Value Ref Range Status   MRSA, PCR NEGATIVE NEGATIVE Final   Staphylococcus aureus NEGATIVE NEGATIVE Final    Comment: (NOTE) The Xpert SA Assay (FDA approved for NASAL specimens in patients 72 years of age and older), is one component of a comprehensive surveillance program. It is not intended to diagnose infection nor to guide or monitor treatment. Performed at [facility], [address], [place], [place] [address]   Culture, blood (Routine X 2) w Reflex to ID Panel     Status: None (Preliminary result)   Collection Time: 01/19/24  9:58 AM   Specimen: BLOOD RIGHT ARM  Result Value Ref Range Status   Specimen Description BLOOD RIGHT ARM  Final   Special Requests   Final    BOTTLES DRAWN AEROBIC AND ANAEROBIC Blood Culture results may not be optimal due to an inadequate volume of blood received in culture bottles   Culture   Final    NO GROWTH 4 DAYS Performed at [facility], [address], [place], [place] [address]    Report Status PENDING  Incomplete  Culture, blood  (Routine X 2) w Reflex to ID Panel     Status: None (Preliminary result)   Collection  Time: 01/19/24 10:17 AM   Specimen: BLOOD RIGHT HAND  Result Value Ref Range Status   Specimen Description BLOOD RIGHT HAND  Final   Special Requests   Final    BOTTLES DRAWN AEROBIC AND ANAEROBIC Blood Culture results may not be optimal due to an inadequate volume of blood received in culture bottles   Culture   Final    NO GROWTH 4 DAYS Performed at [facility], [address] [place], [place] [address]    Report Status PENDING  Incomplete    Radiology Studies: No results found.  Scheduled Meds:  aspirin   81 mg Per Tube BID   Or   aspirin   300 mg Rectal BID   cholecalciferol  1,000 Units Oral Daily   feeding supplement  237 mL Oral TID BM   Influenza vac split trivalent PF  0.5 mL Intramuscular Tomorrow-1000   insulin aspart  0-9 Units Subcutaneous Q4H   isosorbide  mononitrate  30 mg Oral Daily   metoprolol succinate  25 mg Oral Daily   multivitamin with minerals  1 tablet Oral Daily   pneumococcal 20-valent conjugate vaccine  0.5 mL Intramuscular Tomorrow-1000   Continuous Infusions:   LOS: 10 days   Alejandro Marker, DO [facility] Available via Epic secure chat 7am-7pm After these hours, please refer to coverage provider listed on [email or web address] 01/23/2024, 3:47 PM

## 2024-01-24 DIAGNOSIS — E43 Unspecified severe protein-calorie malnutrition: Secondary | ICD-10-CM | POA: Diagnosis not present

## 2024-01-24 DIAGNOSIS — E875 Hyperkalemia: Secondary | ICD-10-CM | POA: Diagnosis not present

## 2024-01-24 DIAGNOSIS — S72001A Fracture of unspecified part of neck of right femur, initial encounter for closed fracture: Secondary | ICD-10-CM | POA: Diagnosis not present

## 2024-01-24 DIAGNOSIS — R1310 Dysphagia, unspecified: Secondary | ICD-10-CM | POA: Diagnosis not present

## 2024-01-24 LAB — GLUCOSE, CAPILLARY
Glucose-Capillary: 100 mg/dL — ABNORMAL HIGH (ref 70–99)
Glucose-Capillary: 101 mg/dL — ABNORMAL HIGH (ref 70–99)
Glucose-Capillary: 166 mg/dL — ABNORMAL HIGH (ref 70–99)
Glucose-Capillary: 168 mg/dL — ABNORMAL HIGH (ref 70–99)
Glucose-Capillary: 200 mg/dL — ABNORMAL HIGH (ref 70–99)
Glucose-Capillary: 211 mg/dL — ABNORMAL HIGH (ref 70–99)
Glucose-Capillary: 89 mg/dL (ref 70–99)

## 2024-01-24 LAB — CBC WITH DIFFERENTIAL/PLATELET
Abs Immature Granulocytes: 0.02 K/uL (ref 0.00–0.07)
Basophils Absolute: 0.1 K/uL (ref 0.0–0.1)
Basophils Relative: 1 %
Eosinophils Absolute: 0.2 K/uL (ref 0.0–0.5)
Eosinophils Relative: 4 %
HCT: 26.1 % — ABNORMAL LOW (ref 39.0–52.0)
Hemoglobin: 8.6 g/dL — ABNORMAL LOW (ref 13.0–17.0)
Immature Granulocytes: 0 %
Lymphocytes Relative: 27 %
Lymphs Abs: 1.8 K/uL (ref 0.7–4.0)
MCH: 25.8 pg — ABNORMAL LOW (ref 26.0–34.0)
MCHC: 33 g/dL (ref 30.0–36.0)
MCV: 78.4 fL — ABNORMAL LOW (ref 80.0–100.0)
Monocytes Absolute: 0.7 K/uL (ref 0.1–1.0)
Monocytes Relative: 11 %
Neutro Abs: 3.8 K/uL (ref 1.7–7.7)
Neutrophils Relative %: 57 %
Platelets: 446 K/uL — ABNORMAL HIGH (ref 150–400)
RBC: 3.33 MIL/uL — ABNORMAL LOW (ref 4.22–5.81)
RDW: 17.1 % — ABNORMAL HIGH (ref 11.5–15.5)
WBC: 6.6 K/uL (ref 4.0–10.5)
nRBC: 0 % (ref 0.0–0.2)

## 2024-01-24 LAB — COMPREHENSIVE METABOLIC PANEL WITH GFR
ALT: 308 U/L — ABNORMAL HIGH (ref 0–44)
AST: 339 U/L — ABNORMAL HIGH (ref 15–41)
Albumin: 2.1 g/dL — ABNORMAL LOW (ref 3.5–5.0)
Alkaline Phosphatase: 106 U/L (ref 38–126)
Anion gap: 7 (ref 5–15)
BUN: 34 mg/dL — ABNORMAL HIGH (ref 8–23)
CO2: 28 mmol/L (ref 22–32)
Calcium: 8.7 mg/dL — ABNORMAL LOW (ref 8.9–10.3)
Chloride: 98 mmol/L (ref 98–111)
Creatinine, Ser: 1.54 mg/dL — ABNORMAL HIGH (ref 0.61–1.24)
GFR, Estimated: 44 mL/min — ABNORMAL LOW (ref >60)
Glucose, Bld: 111 mg/dL — ABNORMAL HIGH (ref 70–99)
Potassium: 5.4 mmol/L — ABNORMAL HIGH (ref 3.5–5.1)
Sodium: 133 mmol/L — ABNORMAL LOW (ref 135–145)
Total Bilirubin: 0.3 mg/dL (ref 0.0–1.2)
Total Protein: 6.5 g/dL (ref 6.5–8.1)

## 2024-01-24 LAB — POTASSIUM
Potassium: 5.5 mmol/L — ABNORMAL HIGH (ref 3.5–5.1)
Potassium: 5.4 mmol/L — ABNORMAL HIGH (ref 3.5–5.1)

## 2024-01-24 LAB — CULTURE, BLOOD (ROUTINE X 2)
Culture: NO GROWTH
Culture: NO GROWTH

## 2024-01-24 LAB — MAGNESIUM: Magnesium: 1.9 mg/dL (ref 1.7–2.4)

## 2024-01-24 LAB — PHOSPHORUS: Phosphorus: 3 mg/dL (ref 2.5–4.6)

## 2024-01-24 MED ORDER — NEPRO/CARBSTEADY PO LIQD
237.0000 mL | Freq: Three times a day (TID) | ORAL | Status: DC
  Administered 2024-01-24 – 2024-01-28 (×12): 237 mL via ORAL
  Filled 2024-01-24: qty 237

## 2024-01-24 NOTE — TOC Progression Note (Cosign Needed Addendum)
 Transition of Care [facility]) - Progression Note    Patient Details  Name: Juan Goodwin MRN: [id number] Date of Birth: 04-21-1935  [facility] CM/SW Contact  Whitfield Campanile, Student-Social Work Phone Number: 01/24/2024, 12:12 PM  Clinical Narrative:     11:17 AM: MSW Intern presented self to patient and daughter at bedside and explained role. Provided current SNF bed offers, patient and daughter SNF choice [facility].   Insurance Auth pending, [place] [id number]   TOC continuing to follow.  Whitfield Campanile, MSW Intern                    Expected Discharge Plan and Services                                               Social Drivers of Health (SDOH) Interventions SDOH Screenings   Food Insecurity: No Food Insecurity (01/13/2024)  Housing: Low Risk  (01/13/2024)  Transportation Needs: No Transportation Needs (01/13/2024)  Utilities: Not At Risk (01/13/2024)  Depression (PHQ2-9): Low Risk  (12/07/2023)  Physical Activity: Inactive (11/08/2021)  Social Connections: Unknown (01/13/2024)  Stress: No Stress Concern Present (11/08/2021)  Tobacco Use: Medium Risk (01/14/2024)    Readmission Risk Interventions     No data to display

## 2024-01-24 NOTE — Progress Notes (Signed)
 PROGRESS NOTE    Juan Goodwin  FMW:[id number] DOB: [birth year] DOA: 01/13/2024 PCP: [name], MD   Brief Narrative: Juan Goodwin is a 89 y.o. male with a history of history of AAA, CAD, CKD stage IIIa, subdural hematoma, duodenal ulcer, CAD, hyperlipidemia, primary pretension. Patient presented secondary to a fall and resultant right femoral neck fracture. Orthopedic surgery was consulted and performed a right hip hemiarthroplasty on 11/3. Hospitalization complicated by resultant metabolic encephalopathy of unknown etiology. Mentation improved over time.   Assessment and Plan:  Right femoral neck fracture Secondary to fall. Orthopedic surgery consulted and performed a right hemiarthroplasty on 11/3.  Orthopedic surgery recommendation for weightbearing as tolerated regarding right lower extremity with posterior hip precautions in addition to aspirin  81 mg twice daily for DVT prophylaxis.  Acute metabolic encephalopathy Presumed diagnosis, although unclear etiology. Symptoms occurred post-surgery. CT head and MRI brain without evidence of acute intracranial process. EEG obtained and was negative for evidence of seizure activity. Some worsening renal function with BUN up to 29. TSH, Vitamin B12, folate, ammonia level do not explain symptoms. Resolved.  AKI on CKD stage IIIa Baseline creatinine of about 1.3-1.4. This admission, creatinine up to a peak of 2.57. Worsened with initial IV fluids. Urine output quantity initially not documented. Blood pressure appears to have been adequate during surgery without evidence of significant hypotension. Renal ultrasound significant for evidence of medical renal disease and bladder suggestive of history of retention. AKI resolved. -Strict in/out  Dysphagia Secondary to mental status change.  Patient is seen and evaluated by speech therapy who recommends n.p.o. status.  Discussed with family and decision made to have Cortrak feeding tube placed  temporarily. Speech therapy reevaluated and patient's diet advanced to a dysphagia 3 diet. Oral intake improved and NG tube removed on 11/12. -SLP recommendations (11/7): Diet recommendations: Dysphagia 3 (mechanical soft);Thin liquid Liquids provided via: Cup;Straw Medication Administration: Whole meds with liquid Supervision: Staff to assist with self feeding (set up assist)  Severe malnutrition -Dietitian recommendations (11/11): Encourage PO intake: Nursing to assist with meals Room service with assist  Went over preordering meals with daughter at bedside Increase Ensure Plus High Protein po to TID, each supplement provides 350 kcal and 20 grams of protein Continue Magic cup TID with meals, each supplement provides 290 kcal and 9 grams of protein 100 mg Thiamine x 7 days po MVI with minerals daily po 1,000 units vitamin D  daily po Discontinue Calorie count Remove cortrak tube, discontinue tube feeds  Hyponatremia Mild. Resolved.  Hyperkalemia Mild -Recheck potassium this afternoon. -BMP in AM  SIRS Patient with temperature of 100.6 F  and WBC of 11,000 on 11/8. Transient. No infectious source identified. Resolved.  Elevated LFTs Mild. Workup unremarkable for etiology. Per GI, possibly ischemic in nature. Improving.   History of CAD Noted.  Patient with prior NSTEMI.  Patient is on metoprolol and Crestor  as an outpatient.  Currently on aspirin  for DVT prophylaxis per orthopedic surgery.  Chronic anemia Likely anemia of chronic disease secondary to known CKD. Baseline hemoglobin of around 9-10. Continued to drift down post-operatively to a low of 7.3 and has stabilized. -Trend CBC  Primary hypertension Uncontrolled on admission. -Continue Imdur  and metoprolol  History of abdominal aortic aneurysm Patient is status post recent infrarenal endovascular aortic repair with coil of the left internal iliac artery  Hypophosphatemia Possibly related to refeeding syndrome.  Resolved.    DVT prophylaxis: Per orthopedic surgery: aspirin  81 mg BID Code Status:  Code Status: Full Code Family Communication: None at bedside Disposition Plan: SNF pending bed availability   Consultants:  Orthopedic surgery Neurology Gastroenterology  Procedures:  Right hip hemiarthroplasty  Antimicrobials: Vancomycin  Cefazolin    Subjective: No concerns this morning.  Objective: BP (!) 141/87 (BP Location: Right Arm)   Pulse 75   Temp 98 F (36.7 C) (Oral)   Resp 15   Ht 6' 1 (1.854 m)   Wt 67.3 kg   SpO2 91%   BMI 19.57 kg/m   Examination:  General exam: Appears calm and comfortable. Respiratory system: Clear to auscultation. Respiratory effort normal. Cardiovascular system: S1 & S2 heard, RRR. No murmur. Gastrointestinal system: Abdomen is nondistended, soft and nontender. Normal bowel sounds heard. Central nervous system: Alert.   Data Reviewed: I have personally reviewed following labs and imaging studies  CBC Lab Results  Component Value Date   WBC 6.6 01/24/2024   RBC 3.33 (L) 01/24/2024   HGB 8.6 (L) 01/24/2024   HCT 26.1 (L) 01/24/2024   MCV 78.4 (L) 01/24/2024   MCH 25.8 (L) 01/24/2024   PLT 446 (H) 01/24/2024   MCHC 33.0 01/24/2024   RDW 17.1 (H) 01/24/2024   LYMPHSABS 1.8 01/24/2024   MONOABS 0.7 01/24/2024   EOSABS 0.2 01/24/2024   BASOSABS 0.1 01/24/2024     Last metabolic panel Lab Results  Component Value Date   NA 133 (L) 01/24/2024   K 5.4 (H) 01/24/2024   CL 98 01/24/2024   CO2 28 01/24/2024   BUN 34 (H) 01/24/2024   CREATININE 1.54 (H) 01/24/2024   GLUCOSE 111 (H) 01/24/2024   GFRNONAA 44 (L) 01/24/2024   GFRAA 46 (L) 06/08/2018   CALCIUM  8.7 (L) 01/24/2024   PHOS 3.0 01/24/2024   PROT 6.5 01/24/2024   ALBUMIN  2.1 (L) 01/24/2024   LABGLOB 4.2 (H) 11/06/2023   BILITOT 0.3 01/24/2024   ALKPHOS 106 01/24/2024   AST 339 (H) 01/24/2024   ALT 308 (H) 01/24/2024   ANIONGAP 7 01/24/2024    GFR: Estimated  Creatinine Clearance: 32.8 mL/min (A) (by C-G formula based on SCr of 1.54 mg/dL (H)).  Recent Results (from the past 240 hours)  Culture, blood (Routine X 2) w Reflex to ID Panel     Status: None (Preliminary result)   Collection Time: 01/19/24  9:58 AM   Specimen: BLOOD RIGHT ARM  Result Value Ref Range Status   Specimen Description BLOOD RIGHT ARM  Final   Special Requests   Final    BOTTLES DRAWN AEROBIC AND ANAEROBIC Blood Culture results may not be optimal due to an inadequate volume of blood received in culture bottles   Culture   Final    NO GROWTH 4 DAYS Performed at [facility], [address], [place], [place] [address]    Report Status PENDING  Incomplete  Culture, blood (Routine X 2) w Reflex to ID Panel     Status: None (Preliminary result)   Collection Time: 01/19/24 10:17 AM   Specimen: BLOOD RIGHT HAND  Result Value Ref Range Status   Specimen Description BLOOD RIGHT HAND  Final   Special Requests   Final    BOTTLES DRAWN AEROBIC AND ANAEROBIC Blood Culture results may not be optimal due to an inadequate volume of blood received in culture bottles   Culture   Final    NO GROWTH 4 DAYS Performed at [facility], [address], [place], [place] [address]    Report Status PENDING  Incomplete  Radiology Studies: No results found.     LOS: 11 days    Elgin Lam, MD [facility] 01/24/2024, 7:45 AM   If 7PM-7AM, please contact night-coverage [email or web address]

## 2024-01-25 ENCOUNTER — Ambulatory Visit: Admitting: Physical Therapy

## 2024-01-25 DIAGNOSIS — S72001A Fracture of unspecified part of neck of right femur, initial encounter for closed fracture: Secondary | ICD-10-CM | POA: Diagnosis not present

## 2024-01-25 DIAGNOSIS — E43 Unspecified severe protein-calorie malnutrition: Secondary | ICD-10-CM | POA: Diagnosis not present

## 2024-01-25 DIAGNOSIS — R1310 Dysphagia, unspecified: Secondary | ICD-10-CM | POA: Diagnosis not present

## 2024-01-25 DIAGNOSIS — E875 Hyperkalemia: Secondary | ICD-10-CM | POA: Diagnosis not present

## 2024-01-25 LAB — COMPREHENSIVE METABOLIC PANEL WITH GFR
ALT: 234 U/L — ABNORMAL HIGH (ref 0–44)
AST: 229 U/L — ABNORMAL HIGH (ref 15–41)
Albumin: 2.2 g/dL — ABNORMAL LOW (ref 3.5–5.0)
Alkaline Phosphatase: 111 U/L (ref 38–126)
Anion gap: 8 (ref 5–15)
BUN: 37 mg/dL — ABNORMAL HIGH (ref 8–23)
CO2: 26 mmol/L (ref 22–32)
Calcium: 8.8 mg/dL — ABNORMAL LOW (ref 8.9–10.3)
Chloride: 100 mmol/L (ref 98–111)
Creatinine, Ser: 1.66 mg/dL — ABNORMAL HIGH (ref 0.61–1.24)
GFR, Estimated: 40 mL/min — ABNORMAL LOW (ref >60)
Glucose, Bld: 97 mg/dL (ref 70–99)
Potassium: 5.4 mmol/L — ABNORMAL HIGH (ref 3.5–5.1)
Sodium: 134 mmol/L — ABNORMAL LOW (ref 135–145)
Total Bilirubin: 0.5 mg/dL (ref 0.0–1.2)
Total Protein: 6.3 g/dL — ABNORMAL LOW (ref 6.5–8.1)

## 2024-01-25 LAB — GLUCOSE, CAPILLARY
Glucose-Capillary: 94 mg/dL (ref 70–99)
Glucose-Capillary: 95 mg/dL (ref 70–99)
Glucose-Capillary: 154 mg/dL — ABNORMAL HIGH (ref 70–99)
Glucose-Capillary: 112 mg/dL — ABNORMAL HIGH (ref 70–99)
Glucose-Capillary: 161 mg/dL — ABNORMAL HIGH (ref 70–99)
Glucose-Capillary: 137 mg/dL — ABNORMAL HIGH (ref 70–99)

## 2024-01-25 LAB — POTASSIUM: Potassium: 5 mmol/L (ref 3.5–5.1)

## 2024-01-25 MED ORDER — SODIUM CHLORIDE 0.9 % IV SOLN: INTRAVENOUS | Status: DC

## 2024-01-25 MED ORDER — SODIUM CHLORIDE 0.9 % IV SOLN: INTRAVENOUS | Status: AC

## 2024-01-25 NOTE — Progress Notes (Signed)
 PROGRESS NOTE    Juan Goodwin  FMW:[id number] DOB: [birth year] DOA: 01/13/2024 PCP: [name], MD   Brief Narrative: Juan Goodwin is a 88 y.o. male with a history of history of AAA, CAD, CKD stage IIIa, subdural hematoma, duodenal ulcer, CAD, hyperlipidemia, primary pretension. Patient presented secondary to a fall and resultant right femoral neck fracture. Orthopedic surgery was consulted and performed a right hip hemiarthroplasty on 11/3. Hospitalization complicated by resultant metabolic encephalopathy of unknown etiology. Mentation improved over time.   Assessment and Plan:  Right femoral neck fracture Secondary to fall. Orthopedic surgery consulted and performed a right hemiarthroplasty on 11/3.  Orthopedic surgery recommendation for weightbearing as tolerated regarding right lower extremity with posterior hip precautions in addition to aspirin  81 mg twice daily for DVT prophylaxis.  Acute metabolic encephalopathy Presumed diagnosis, although unclear etiology. Symptoms occurred post-surgery. CT head and MRI brain without evidence of acute intracranial process. EEG obtained and was negative for evidence of seizure activity. Some worsening renal function with BUN up to 29. TSH, Vitamin B12, folate, ammonia level do not explain symptoms. Resolved.  AKI on CKD stage IIIa Baseline creatinine of about 1.3-1.4. This admission, creatinine up to a peak of 2.57. Worsened with initial IV fluids. Urine output quantity initially not documented. Blood pressure appears to have been adequate during surgery without evidence of significant hypotension. Renal ultrasound significant for evidence of medical renal disease and bladder suggestive of history of retention. AKI resolved. -Strict in/out  Dysphagia Secondary to mental status change.  Patient is seen and evaluated by speech therapy who recommends n.p.o. status.  Discussed with family and decision made to have Cortrak feeding tube placed  temporarily. Speech therapy reevaluated and patient's diet advanced to a dysphagia 3 diet. Oral intake improved and NG tube removed on 11/12. -SLP recommendations (11/7): Diet recommendations: Dysphagia 3 (mechanical soft);Thin liquid Liquids provided via: Cup;Straw Medication Administration: Whole meds with liquid Supervision: Staff to assist with self feeding (set up assist)  Severe malnutrition -Dietitian recommendations (11/11): Encourage PO intake: Nursing to assist with meals Room service with assist  Went over preordering meals with daughter at bedside Increase Ensure Plus High Protein po to TID, each supplement provides 350 kcal and 20 grams of protein Continue Magic cup TID with meals, each supplement provides 290 kcal and 9 grams of protein 100 mg Thiamine x 7 days po MVI with minerals daily po 1,000 units vitamin D  daily po Discontinue Calorie count Remove cortrak tube, discontinue tube feeds  Hyponatremia Mild. Resolved.  Hyperkalemia Mild. Adjusted patient's feeding supplement. -IV fluids -BMP in AM -Continue telemetry  SIRS Patient with temperature of 100.6 F  and WBC of 11,000 on 11/8. Transient. No infectious source identified. Resolved.  Elevated LFTs Mild. Workup unremarkable for etiology. Per GI, possibly ischemic in nature. Improving.  Possible cirrhosis Noted on ultrasound. Outpatient GI follow-up.  History of CAD Noted.  Patient with prior NSTEMI.  Patient is on metoprolol and Crestor  as an outpatient.  Currently on aspirin  for DVT prophylaxis per orthopedic surgery.  Chronic anemia Likely anemia of chronic disease secondary to known CKD. Baseline hemoglobin of around 9-10. Continued to drift down post-operatively to a low of 7.3 and has stabilized. -Trend CBC  Primary hypertension Uncontrolled on admission. -Continue Imdur  and metoprolol  History of abdominal aortic aneurysm Patient is status post recent infrarenal endovascular aortic repair  with coil of the left internal iliac artery  Hypophosphatemia Possibly related to refeeding syndrome. Resolved.  DVT prophylaxis: Per orthopedic surgery: aspirin  81 mg BID Code Status:   Code Status: Full Code Family Communication: None at bedside. Daughter on telephone. Disposition Plan: SNF pending bed availability   Consultants:  Orthopedic surgery Neurology Gastroenterology  Procedures:  Right hip hemiarthroplasty  Antimicrobials: Vancomycin  Cefazolin    Subjective: No issues today.  Objective: BP 126/65 (BP Location: Right Arm)   Pulse 81   Temp 98.1 F (36.7 C) (Oral)   Resp 15   Ht 6' 1 (1.854 m)   Wt 66.3 kg   SpO2 100%   BMI 19.28 kg/m   Examination:  General exam: Appears calm and comfortable. Finished his breakfast. Respiratory system: Clear to auscultation. Respiratory effort normal. Cardiovascular system: S1 & S2 heard, RRR. No murmur. Gastrointestinal system: Abdomen is nondistended, soft and nontender. Normal bowel sounds heard. Central nervous system: Alert.   Data Reviewed: I have personally reviewed following labs and imaging studies  CBC Lab Results  Component Value Date   WBC 6.6 01/24/2024   RBC 3.33 (L) 01/24/2024   HGB 8.6 (L) 01/24/2024   HCT 26.1 (L) 01/24/2024   MCV 78.4 (L) 01/24/2024   MCH 25.8 (L) 01/24/2024   PLT 446 (H) 01/24/2024   MCHC 33.0 01/24/2024   RDW 17.1 (H) 01/24/2024   LYMPHSABS 1.8 01/24/2024   MONOABS 0.7 01/24/2024   EOSABS 0.2 01/24/2024   BASOSABS 0.1 01/24/2024     Last metabolic panel Lab Results  Component Value Date   NA 134 (L) 01/25/2024   K 5.0 01/25/2024   CL 100 01/25/2024   CO2 26 01/25/2024   BUN 37 (H) 01/25/2024   CREATININE 1.66 (H) 01/25/2024   GLUCOSE 97 01/25/2024   GFRNONAA 40 (L) 01/25/2024   GFRAA 46 (L) 06/08/2018   CALCIUM  8.8 (L) 01/25/2024   PHOS 3.0 01/24/2024   PROT 6.3 (L) 01/25/2024   ALBUMIN  2.2 (L) 01/25/2024   LABGLOB 4.2 (H) 11/06/2023   BILITOT 0.5  01/25/2024   ALKPHOS 111 01/25/2024   AST 229 (H) 01/25/2024   ALT 234 (H) 01/25/2024   ANIONGAP 8 01/25/2024    GFR: Estimated Creatinine Clearance: 30 mL/min (A) (by C-G formula based on SCr of 1.66 mg/dL (H)).  Recent Results (from the past 240 hours)  Culture, blood (Routine X 2) w Reflex to ID Panel     Status: None   Collection Time: 01/19/24  9:58 AM   Specimen: BLOOD RIGHT ARM  Result Value Ref Range Status   Specimen Description BLOOD RIGHT ARM  Final   Special Requests   Final    BOTTLES DRAWN AEROBIC AND ANAEROBIC Blood Culture results may not be optimal due to an inadequate volume of blood received in culture bottles   Culture   Final    NO GROWTH 5 DAYS Performed at [facility], [address] [place], [place] [address]    Report Status 01/24/2024 FINAL  Final  Culture, blood (Routine X 2) w Reflex to ID Panel     Status: None   Collection Time: 01/19/24 10:17 AM   Specimen: BLOOD RIGHT HAND  Result Value Ref Range Status   Specimen Description BLOOD RIGHT HAND  Final   Special Requests   Final    BOTTLES DRAWN AEROBIC AND ANAEROBIC Blood Culture results may not be optimal due to an inadequate volume of blood received in culture bottles   Culture   Final    NO GROWTH 5 DAYS Performed at [facility], [address], [place], [place] [address]    Report Status 01/24/2024 FINAL  Final      Radiology Studies: No results found.     LOS: 12 days    Elgin Lam, MD [facility] 01/25/2024, 3:22 PM   If 7PM-7AM, please contact night-coverage [email or web address]

## 2024-01-25 NOTE — Progress Notes (Addendum)
 Occupational Therapy Treatment Patient Details Name: Juan Goodwin MRN: [id number] DOB: 07/17/1935 Today's Date: 01/25/2024   History of present illness 88 yo male presents to ED 11/2 with fall x1 week ago, subsequent R hip pain and + head trauma. Pt sustained R femoral neck fx s/p hemiarthroplasty (WBAT, posterior hip precautions) 11/3. Code stroke called post-op, CTH and CTA negative for acute findings, MRI negative. EEG negative. PMH includes AAA  s/p recent infrarenal endovascular aortic repair with coil of the left internal iliac artery on 01/04/2024, CAD, CKD, hypertension, MI, SDH, duodenal ulcer.   OT comments  Patient supine in bed and agreeable to therapy.   Pt progressing to min assist for bed mobility, but continues to require mod assist +2 safety for transfers and stepping to bsc and recliner using RW.  He is incontinent of bowel in standing at EOB, BP soft but stable throughout session. Cueing for adherence to hip precautions functionally, recalling 2/3 initially without cues.  Total assist +2 for LB ADLs and toileting due to incontinence of bowel. Requires encouragement to increase OOB throughout the day, poor activity tolerance.  Continue to recommend <3hrs/day inpatient setting at dc. Will follow acutely.       If plan is discharge home, recommend the following:  Assistance with cooking/housework;Direct supervision/assist for financial management;Assist for transportation;Help with stairs or ramp for entrance;A lot of help with walking and/or transfers;A lot of help with bathing/dressing/bathroom;Assistance with feeding;Direct supervision/assist for medications management;Supervision due to cognitive status   Equipment Recommendations  BSC/3in1;Other (comment) (defer)    Recommendations for Other Services      Precautions / Restrictions Precautions Precautions: Fall;Posterior Hip Precaution Booklet Issued: Yes (comment) Recall of Precautions/Restrictions:  Impaired Precaution/Restrictions Comments: pt recalled 2/3 hip precautions during session, but requires cueing to adhere to functionally Required Braces or Orthoses:  (abduction pillow in bed) Restrictions Weight Bearing Restrictions Per Provider Order: Yes RLE Weight Bearing Per Provider Order: Weight bearing as tolerated       Mobility Bed Mobility Overal bed mobility: Needs Assistance Bed Mobility: Supine to Sit     Supine to sit: Min assist     General bed mobility comments: R LE support to EOB, increased time to elevated trunk and scoot hips    Transfers Overall transfer level: Needs assistance Equipment used: Rolling walker (2 wheels) Transfers: Sit to/from Stand Sit to Stand: Mod assist, +2 safety/equipment           General transfer comment: to power up and fully come forward, cueing for UE/R LE placement     Balance Overall balance assessment: Needs assistance Sitting-balance support: No upper extremity supported, Feet supported Sitting balance-Leahy Scale: Fair Sitting balance - Comments: close supervision statically   Standing balance support: Bilateral upper extremity supported, Reliant on assistive device for balance Standing balance-Leahy Scale: Poor Standing balance comment: min assist, relies on BUE and external support                           ADL either performed or assessed with clinical judgement   ADL Overall ADL's : Needs assistance/impaired     Grooming: Set up;Sitting               Lower Body Dressing: +2 for physical assistance;Total assistance;Sit to/from stand Lower Body Dressing Details (indicate cue type and reason): requires assist for socks, mod assist +2 safety  to stand and relies on BUE Support Toilet Transfer: Moderate assistance;+2 for safety/equipment Toilet Transfer  Details (indicate cue type and reason): step pivot to [facility] then to recliner Toileting- Clothing Manipulation and Hygiene: Total assistance;+2  for physical assistance Toileting - Clothing Manipulation Details (indicate cue type and reason): incontient of bowel in standing     Functional mobility during ADLs: Moderate assistance;+2 for safety/equipment;Rolling walker (2 wheels);Cueing for sequencing;Cueing for safety      Extremity/Trunk Assessment              Vision       Perception     Praxis     Communication Communication Communication: Impaired Factors Affecting Communication: Hearing impaired   Cognition Arousal: Alert Behavior During Therapy: Flat affect Cognition: Cognition impaired     Awareness: Intellectual awareness intact, Online awareness impaired Memory impairment (select all impairments): Working memory Attention impairment (select first level of impairment): Sustained attention Executive functioning impairment (select all impairments): Sequencing, Problem solving, Organization OT - Cognition Comments: pt recalling 2/3 hip precautions, needing cueing to adhere functionally. improving carryover of sequencing during transfers but continues to require cueing.  incontient of bowel when standing                 Following commands: Impaired Following commands impaired: Follows one step commands with increased time, Follows multi-step commands inconsistently      Cueing   Cueing Techniques: Verbal cues, Tactile cues  Exercises      Shoulder Instructions       General Comments BP soft but stable    Pertinent Vitals/ Pain       Pain Assessment Pain Assessment: Faces Faces Pain Scale: Hurts a little bit Pain Location: generalized Pain Descriptors / Indicators: Sore Pain Intervention(s): Limited activity within patient's tolerance, Monitored during session, Repositioned  Home Living                                          Prior Functioning/Environment              Frequency  Min 2X/week        Progress Toward Goals  OT Goals(current goals can now be  found in the care plan section)  Progress towards OT goals: Progressing toward goals  Acute Rehab OT Goals Patient Stated Goal: get better OT Goal Formulation: With patient Time For Goal Achievement: 01/30/24 Potential to Achieve Goals: Good  Plan      Co-evaluation    PT/OT/SLP Co-Evaluation/Treatment: Yes Reason for Co-Treatment: For patient/therapist safety;To address functional/ADL transfers   OT goals addressed during session: ADL's and self-care      AM-PAC OT 6 Clicks Daily Activity     Outcome Measure   Help from another person eating meals?: A Little Help from another person taking care of personal grooming?: A Little Help from another person toileting, which includes using toliet, bedpan, or urinal?: Total Help from another person bathing (including washing, rinsing, drying)?: A Lot Help from another person to put on and taking off regular upper body clothing?: A Little Help from another person to put on and taking off regular lower body clothing?: Total 6 Click Score: 13    End of Session Equipment Utilized During Treatment: Gait belt;Rolling walker (2 wheels)  OT Visit Diagnosis: Other abnormalities of gait and mobility (R26.89);Muscle weakness (generalized) (M62.81);Other symptoms and signs involving cognitive function;History of falling (Z91.81)   Activity Tolerance Patient tolerated treatment well   Patient Left with call bell/phone within  reach;in chair;with chair alarm set   Nurse Communication Mobility status;Precautions        Time: [phone] OT Time Calculation (min): 23 min  Charges: OT General Charges $OT Visit: 1 Visit OT Treatments $Self Care/Home Management : 8-22 mins  Etta NOVAK, OT Acute Rehabilitation Services Office [phone] Secure Chat Preferred    Etta GORMAN Hope 01/25/2024, 9:28 AM

## 2024-01-25 NOTE — Progress Notes (Addendum)
 Physical Therapy Treatment Patient Details Name: Juan Goodwin MRN: [id number] DOB: 1935/11/18 Today's Date: 01/25/2024   History of Present Illness 88 yo male presents to ED 11/2 with fall x1 week ago, subsequent R hip pain and + head trauma. Pt sustained R femoral neck fx s/p hemiarthroplasty (WBAT, posterior hip precautions) 11/3. Code stroke called post-op, CTH and CTA negative for acute findings, MRI negative. EEG negative. PMH includes AAA  s/p recent infrarenal endovascular aortic repair with coil of the left internal iliac artery on 01/04/2024, CAD, CKD, hypertension, MI, SDH, duodenal ulcer.    PT Comments  The pt was able to take steps to transfer from bed to commode to recliner, ambulating up to ~4 ft at a time with RW and minA +2 for safety. He was limited by his BP being low (yet stable, SBP 80s sitting and standing, 100s sitting with legs elevated end of session though) and by bowel incontinence. He is demonstrating progress with bed mobility and transfer independence, now only needing minAx1 to sit up from supine and modA + for safety to transfer to stand. He remains at risk for falls. Will continue to follow acutely.    If plan is discharge home, recommend the following: Two people to help with walking and/or transfers;Two people to help with bathing/dressing/bathroom;Assistance with feeding;Assistance with cooking/housework;Direct supervision/assist for medications management;Direct supervision/assist for financial management;Assist for transportation   Can travel by private vehicle     No  Equipment Recommendations  Rolling walker (2 wheels);Wheelchair (measurements PT);Wheelchair cushion (measurements PT);BSC/3in1    Recommendations for Other Services       Precautions / Restrictions Precautions Precautions: Fall;Posterior Hip Precaution Booklet Issued: Yes (comment) Recall of Precautions/Restrictions: Impaired Precaution/Restrictions Comments: watch BP, bowel  incontinence, pt recalled 2/3 hip precautions during session, but requires cueing to adhere to functionally Required Braces or Orthoses:  (abduction pillow in bed) Restrictions Weight Bearing Restrictions Per Provider Order: Yes RLE Weight Bearing Per Provider Order: Weight bearing as tolerated     Mobility  Bed Mobility Overal bed mobility: Needs Assistance Bed Mobility: Supine to Sit     Supine to sit: Min assist, HOB elevated     General bed mobility comments: R LE minA support to EOB, increased time to elevate trunk and scoot hips    Transfers Overall transfer level: Needs assistance Equipment used: Rolling walker (2 wheels) Transfers: Sit to/from Stand, Bed to chair/wheelchair/BSC Sit to Stand: Mod assist, +2 safety/equipment   Step pivot transfers: Min assist, +2 safety/equipment       General transfer comment: Repeated cues provided to push up with bil hands on bed or commode rather than on RW and to kick R leg anterior with sit <> stand transfers. Fair carryover noted. ModA, + 2 for safety, to shift weight anteriorly and gain balance with transfers to stand. MinA to steady with step pivot to L bed to commode then to R commode to recliner with RW support.    Ambulation/Gait Ambulation/Gait assistance: Min assist, +2 safety/equipment Gait Distance (Feet): 4 Feet (x2 bouts of ~2 ft > ~4 ft) Assistive device: Rolling walker (2 wheels) Gait Pattern/deviations: Step-to pattern, Decreased stride length, Trunk flexed, Antalgic Gait velocity: reduced Gait velocity interpretation: <1.31 ft/sec, indicative of household ambulator   General Gait Details: Pt with slow, small, antalgic steps between bed, chair, and commode with RW support and minA for balance, +2 for safety.   Stairs             Psychologist, [employer] [facility] [facility]  Tilt Bed    Modified Rankin (Stroke Patients Only)       Balance Overall balance assessment: Needs assistance Sitting-balance support: No upper  extremity supported, Feet supported Sitting balance-Leahy Scale: Fair Sitting balance - Comments: close supervision statically   Standing balance support: Bilateral upper extremity supported, Reliant on assistive device for balance Standing balance-Leahy Scale: Poor Standing balance comment: min assist, relies on BUE and external support                            Communication Communication Communication: Impaired Factors Affecting Communication: Hearing impaired  Cognition Arousal: Alert Behavior During Therapy: Flat affect   PT - Cognitive impairments: No family/caregiver present to determine baseline                       PT - Cognition Comments: Pt HOH, but follows simple cues with extra time Following commands: Impaired Following commands impaired: Follows one step commands with increased time, Follows multi-step commands inconsistently    Cueing Cueing Techniques: Verbal cues, Tactile cues  Exercises General Exercises - Lower Extremity Ankle Circles/Pumps: AROM, Both, 10 reps, Seated    General Comments General comments (skin integrity, edema, etc.): BP soft but stable (SBP in 80s sitting and standing, 100s sitting with legs elevated end of session)      Pertinent Vitals/Pain Pain Assessment Pain Assessment: Faces Faces Pain Scale: Hurts a little bit Pain Location: generalized Pain Descriptors / Indicators: Sore Pain Intervention(s): Limited activity within patient's tolerance, Monitored during session, Repositioned    Home Living                          Prior Function            PT Goals (current goals can now be found in the care plan section) Acute Rehab PT Goals Patient Stated Goal: to improve mobility quality PT Goal Formulation: With patient Time For Goal Achievement: 01/30/24 Potential to Achieve Goals: Fair Progress towards PT goals: Progressing toward goals    Frequency    Min 2X/week      PT Plan       Co-evaluation   Reason for Co-Treatment: For patient/therapist safety;To address functional/ADL transfers PT goals addressed during session: Mobility/safety with mobility;Balance;Proper use of DME OT goals addressed during session: ADL's and self-care      AM-PAC PT 6 Clicks Mobility   Outcome Measure  Help needed turning from your back to your side while in a flat bed without using bedrails?: A Little Help needed moving from lying on your back to sitting on the side of a flat bed without using bedrails?: A Little Help needed moving to and from a bed to a chair (including a wheelchair)?: A Little Help needed standing up from a chair using your arms (e.g., wheelchair or bedside chair)?: A Lot Help needed to walk in hospital room?: Total Help needed climbing 3-5 steps with a railing? : Total 6 Click Score: 13    End of Session Equipment Utilized During Treatment: Gait belt Activity Tolerance: Patient tolerated treatment well;Other (comment) (limited by bowel incontinence and low BP) Patient left: in chair;with call bell/phone within reach;with chair alarm set Nurse Communication: Mobility status;Other (comment) (BP, BM) PT Visit Diagnosis: Other abnormalities of gait and mobility (R26.89);History of falling (Z91.81);Muscle weakness (generalized) (M62.81);Difficulty in walking, not elsewhere classified (R26.2);Unsteadiness on feet (R26.81)     Time: [phone]  PT Time Calculation (min) (ACUTE ONLY): 23 min  Charges:    $Therapeutic Activity: 8-22 mins PT General Charges $$ ACUTE PT VISIT: 1 Visit                     Theo Ferretti, PT, DPT Acute Rehabilitation Services  Office: [phone]    Theo CHRISTELLA Ferretti 01/25/2024, 9:46 AM

## 2024-01-25 NOTE — TOC Progression Note (Addendum)
 Transition of Care [facility]) - Progression Note    Patient Details  Name: DAILEY BUCCHERI MRN: [id number] Date of Birth: 1935/08/26  [facility] CM/SW Contact  Montie LOISE Louder, [place] Phone Number: 01/25/2024, 11:26 AM  Clinical Narrative:     Insurance authorization approved  11/13 - 88/80 [place] Cert# [id number]   [facility]/SNF  confirmed they can admit patient today if stable.     Montie Louder, MSW, LCSW Clinical Social Worker                     Expected Discharge Plan and Services                                               Social Drivers of Health (SDOH) Interventions SDOH Screenings   Food Insecurity: No Food Insecurity (01/13/2024)  Housing: Low Risk  (01/13/2024)  Transportation Needs: No Transportation Needs (01/13/2024)  Utilities: Not At Risk (01/13/2024)  Depression (PHQ2-9): Low Risk  (12/07/2023)  Physical Activity: Inactive (11/08/2021)  Social Connections: Unknown (01/13/2024)  Stress: No Stress Concern Present (11/08/2021)  Tobacco Use: Medium Risk (01/14/2024)    Readmission Risk Interventions     No data to display

## 2024-01-25 NOTE — Plan of Care (Signed)
 Problem: Education: Goal: Knowledge of General Education information will improve Description: Including pain rating scale, medication(s)/side effects and non-pharmacologic comfort measures Outcome: Progressing   Problem: Health Behavior/Discharge Planning: Goal: Ability to manage health-related needs will improve Outcome: Progressing   Problem: Clinical Measurements: Goal: Ability to maintain clinical measurements within normal limits will improve Outcome: Progressing Goal: Will remain free from infection Outcome: Progressing Goal: Diagnostic test results will improve Outcome: Progressing Goal: Respiratory complications will improve Outcome: Progressing Goal: Cardiovascular complication will be avoided Outcome: Progressing   Problem: Activity: Goal: Risk for activity intolerance will decrease Outcome: Progressing   Problem: Nutrition: Goal: Adequate nutrition will be maintained Outcome: Progressing   Problem: Coping: Goal: Level of anxiety will decrease Outcome: Progressing   Problem: Elimination: Goal: Will not experience complications related to bowel motility Outcome: Progressing Goal: Will not experience complications related to urinary retention Outcome: Progressing   Problem: Pain Managment: Goal: General experience of comfort will improve and/or be controlled Outcome: Progressing   Problem: Safety: Goal: Ability to remain free from injury will improve Outcome: Progressing   Problem: Skin Integrity: Goal: Risk for impaired skin integrity will decrease Outcome: Progressing   Problem: Education: Goal: Required Educational Video(s) Outcome: Progressing   Problem: Clinical Measurements: Goal: Postoperative complications will be avoided or minimized Outcome: Progressing   Problem: Skin Integrity: Goal: Demonstration of wound healing without infection will improve Outcome: Progressing   Problem: Delirium Goal: STG: Jenesis's ability to perform  activities of daily living will improve Outcome: Progressing Goal: STG: Zorian's ability to verbalize feelings about their condition will improve Outcome: Progressing   Problem: Education: Goal: Knowledge of disease or condition will improve Outcome: Progressing Goal: Knowledge of secondary prevention will improve (MUST DOCUMENT ALL) Outcome: Progressing Goal: Knowledge of patient specific risk factors will improve (DELETE if not current risk factor) Outcome: Progressing   Problem: Ischemic Stroke/TIA Tissue Perfusion: Goal: Complications of ischemic stroke/TIA will be minimized Outcome: Progressing   Problem: Coping: Goal: Will verbalize positive feelings about self Outcome: Progressing Goal: Will identify appropriate support needs Outcome: Progressing   Problem: Health Behavior/Discharge Planning: Goal: Ability to manage health-related needs will improve Outcome: Progressing Goal: Goals will be collaboratively established with patient/family Outcome: Progressing   Problem: Self-Care: Goal: Ability to participate in self-care as condition permits will improve Outcome: Progressing Goal: Verbalization of feelings and concerns over difficulty with self-care will improve Outcome: Progressing Goal: Ability to communicate needs accurately will improve Outcome: Progressing   Problem: Nutrition: Goal: Risk of aspiration will decrease Outcome: Progressing Goal: Dietary intake will improve Outcome: Progressing   Problem: Education: Goal: Verbalization of understanding the information provided (i.e., activity precautions, restrictions, etc) will improve Outcome: Progressing   Problem: Activity: Goal: Ability to ambulate and perform ADLs will improve Outcome: Progressing   Problem: Clinical Measurements: Goal: Postoperative complications will be avoided or minimized Outcome: Progressing   Problem: Self-Concept: Goal: Ability to maintain and perform role responsibilities to  the fullest extent possible will improve Outcome: Progressing   Problem: Pain Management: Goal: Pain level will decrease Outcome: Progressing   Problem: Education: Goal: Ability to describe self-care measures that may prevent or decrease complications (Diabetes Survival Skills Education) will improve Outcome: Progressing Goal: Individualized Educational Video(s) Outcome: Progressing   Problem: Coping: Goal: Ability to adjust to condition or change in health will improve Outcome: Progressing   Problem: Fluid Volume: Goal: Ability to maintain a balanced intake and output will improve Outcome: Progressing   Problem: Health Behavior/Discharge Planning: Goal: Ability to identify and  utilize available resources and services will improve Outcome: Progressing Goal: Ability to manage health-related needs will improve Outcome: Progressing   Problem: Metabolic: Goal: Ability to maintain appropriate glucose levels will improve Outcome: Progressing   Problem: Nutritional: Goal: Maintenance of adequate nutrition will improve Outcome: Progressing Goal: Progress toward achieving an optimal weight will improve Outcome: Progressing   Problem: Skin Integrity: Goal: Risk for impaired skin integrity will decrease Outcome: Progressing   Problem: Tissue Perfusion: Goal: Adequacy of tissue perfusion will improve Outcome: Progressing

## 2024-01-26 DIAGNOSIS — E875 Hyperkalemia: Secondary | ICD-10-CM | POA: Diagnosis not present

## 2024-01-26 DIAGNOSIS — R1310 Dysphagia, unspecified: Secondary | ICD-10-CM | POA: Diagnosis not present

## 2024-01-26 DIAGNOSIS — S72001A Fracture of unspecified part of neck of right femur, initial encounter for closed fracture: Secondary | ICD-10-CM | POA: Diagnosis not present

## 2024-01-26 DIAGNOSIS — E43 Unspecified severe protein-calorie malnutrition: Secondary | ICD-10-CM | POA: Diagnosis not present

## 2024-01-26 LAB — BASIC METABOLIC PANEL WITH GFR
Anion gap: 8 (ref 5–15)
BUN: 38 mg/dL — ABNORMAL HIGH (ref 8–23)
CO2: 26 mmol/L (ref 22–32)
Calcium: 8.7 mg/dL — ABNORMAL LOW (ref 8.9–10.3)
Chloride: 101 mmol/L (ref 98–111)
Creatinine, Ser: 1.68 mg/dL — ABNORMAL HIGH (ref 0.61–1.24)
GFR, Estimated: 39 mL/min — ABNORMAL LOW (ref >60)
Glucose, Bld: 96 mg/dL (ref 70–99)
Potassium: 5.4 mmol/L — ABNORMAL HIGH (ref 3.5–5.1)
Sodium: 135 mmol/L (ref 135–145)

## 2024-01-26 LAB — GLUCOSE, CAPILLARY
Glucose-Capillary: 89 mg/dL (ref 70–99)
Glucose-Capillary: 91 mg/dL (ref 70–99)
Glucose-Capillary: 79 mg/dL (ref 70–99)
Glucose-Capillary: 141 mg/dL — ABNORMAL HIGH (ref 70–99)
Glucose-Capillary: 169 mg/dL — ABNORMAL HIGH (ref 70–99)
Glucose-Capillary: 123 mg/dL — ABNORMAL HIGH (ref 70–99)

## 2024-01-26 MED ORDER — SODIUM CHLORIDE 0.9 % IV BOLUS
500.0000 mL | Freq: Once | INTRAVENOUS | Status: AC
  Administered 2024-01-26: 500 mL via INTRAVENOUS

## 2024-01-26 MED ORDER — SODIUM ZIRCONIUM CYCLOSILICATE 10 G PO PACK
10.0000 g | PACK | Freq: Three times a day (TID) | ORAL | Status: DC
  Administered 2024-01-26 – 2024-01-27 (×3): 10 g via ORAL
  Filled 2024-01-26 (×5): qty 1

## 2024-01-26 MED ORDER — SODIUM CHLORIDE 0.9 % IV SOLN: INTRAVENOUS | Status: AC

## 2024-01-26 NOTE — Consult Note (Signed)
 Renal Service Consult Note [facility] Lamar JONETTA Fret, MD  Patient: Juan Goodwin Date: 01/26/2024 Requesting Physician: Dr. Briana, R.   Reason for Consult: Hyperkalemia, ,  HPI: The patient is a 88 y.o. year-old w/ PMH of CAD, CKD 3a SDH, hx AAA, GIB/ duodenal ulcer, HL, HTN, who presented 01/13/24 after falling at home. He had a R femoral neck fracture. Orthopedics did R hip surgery on 11/3. Pt became confused and w/u was done w/o a real cause, nevertheless her mentation improved slowly. The last 3-5 days the K+ level has been up in the 5- 5.5 range, creatinine 1.4- 1.8. Otherwise ready for discharge. We are asked to see for renal failure and hyperkalemia.    Pt seen in room. He is a bit confused. There are occasional low BP's in the 90s. Poor historian.    ROS - denies CP, no joint pain, no HA, no blurry vision, no rash   Past Medical History  Past Medical History:  Diagnosis Date   AAA (abdominal aortic aneurysm)    Acute pyloric channel ulcer 07/13/2014   Acute upper GI bleed 07/13/2014   Aortic atherosclerosis    Ascending aorta dilatation    4.3cm by echo 09/2022 and 3.9cm by Chest CT 03/2023.   Blood transfusion without reported diagnosis    CAD (coronary artery disease)    Cerebrovascular disease 10/08/2018   Chronic kidney disease, stage 3a (HCC) 03/30/2017   Dilated aortic root    38 mm by echo with 04/2021   Gastrointestinal hemorrhage with melena 06/2014   Global aphasia 03/27/2017   Helicobacter pylori gastritis 07/13/2014   High cholesterol    Hypernatremia 07/13/2014   Hypertension    Hypokalemia 07/13/2014   Myocardial infarction [facility])    mild heart attack in 2022   Normocytic normochromic anemia 04/10/2017   REM sleep behavior disorder 10/08/2018   Subdural hematoma (HCC) 03/26/2017   Thrombocytopenia 07/13/2014   Past Surgical History  Past Surgical History:  Procedure Laterality Date   ABDOMINAL AORTIC ENDOVASCULAR STENT GRAFT  Bilateral 01/04/2024   Procedure: INSERTION, ENDOVASCULAR STENT GRAFT, AORTA, ABDOMINAL; COIL EMBOLIZATION OF LEFT INTERNAL ILIAC ARTERY ANEURYSM;  Surgeon: Lanis Fonda BRAVO, MD;  Location: [facility] OR;  Service: Vascular;  Laterality: Bilateral;   BRAIN SURGERY     CRANIOTOMY Left 03/26/2017   Procedure: CRANIOTOMY HEMATOMA EVACUATION SUBDURAL;  Surgeon: Onetha Kuba, MD;  Location: [facility] OR;  Service: Neurosurgery;  Laterality: Left;   ESOPHAGOGASTRODUODENOSCOPY N/A 07/11/2014   Procedure: ESOPHAGOGASTRODUODENOSCOPY (EGD);  Surgeon: Jerrell Sol, MD;  Location: [facility];  Service: Endoscopy;  Laterality: N/A;   HIP ARTHROPLASTY Right 01/14/2024   Procedure: HEMIARTHROPLASTY (BIPOLAR) HIP, POSTERIOR APPROACH FOR FRACTURE;  Surgeon: Georgina Ozell LABOR, MD;  Location: [facility] OR;  Service: Orthopedics;  Laterality: Right;   LEFT HEART CATH AND CORONARY ANGIOGRAPHY N/A 04/26/2020   Procedure: LEFT HEART CATH AND CORONARY ANGIOGRAPHY;  Surgeon: Dann Candyce RAMAN, MD;  Location: [facility] INVASIVE CV LAB;  Service: Cardiovascular;  Laterality: N/A;   ULTRASOUND GUIDANCE FOR VASCULAR ACCESS Bilateral 01/04/2024   Procedure: ULTRASOUND GUIDANCE, FOR VASCULAR ACCESS;  Surgeon: Lanis Fonda BRAVO, MD;  Location: [facility] OR;  Service: Vascular;  Laterality: Bilateral;   Family History  Family History  Problem Relation Age of Onset   Heart failure Mother    Social History  reports that he has quit smoking. His smoking use included cigarettes. He has a 35 pack-year smoking history. He has never used smokeless tobacco. He reports that he does  not currently use drugs. He reports that he does not drink alcohol. Allergies  Allergies  Allergen Reactions   Cardura [Doxazosin Mesylate] Other (See Comments)    Dizziness  Syncope    Home medications Prior to Admission medications   Medication Sig Start Date End Date Taking? Authorizing Provider  acetaminophen  (TYLENOL ) 500 MG tablet Take 1,000 mg by mouth every 6 (six) hours as  needed for headache or fever (pain).   Yes [provider]  isosorbide  mononitrate (IMDUR ) 30 MG 24 hr tablet TAKE 1 TABLET(30 MG) BY MOUTH DAILY 07/07/22  Yes Turner, Wilbert SAUNDERS, MD  metoprolol succinate (TOPROL XL) 25 MG 24 hr tablet Take 1 tablet (25 mg total) by mouth daily. 12/11/23  Yes Vannie Reche RAMAN, NP  Multiple Vitamins-Minerals (MULTIVITAMIN MEN 50+) TABS Take 1 tablet by mouth daily.   Yes [provider]  nitroGLYCERIN  (NITROSTAT ) 0.4 MG SL tablet Place 1 tablet (0.4 mg total) under the tongue every 5 (five) minutes as needed for chest pain. 09/11/22  Yes Turner, Wilbert SAUNDERS, MD  rosuvastatin  (CRESTOR ) 40 MG tablet Take 1 tablet (40 mg total) by mouth daily. Patient taking differently: Take 40 mg by mouth every evening. 05/10/20  Yes Turner, Wilbert SAUNDERS, MD  senna (SENOKOT) 8.6 MG TABS tablet Take 1 tablet (8.6 mg total) by mouth in the morning and at bedtime for 14 days. 01/18/24 02/01/24 Yes Georgina Ozell LABOR, MD  traMADol (ULTRAM) 50 MG tablet Take 1 tablet (50 mg total) by mouth every 6 (six) hours as needed for moderate pain (pain score 4-6). 01/05/24  Yes Bethanie Cough, PA-C  aspirin  EC (ASPIRIN  LOW DOSE) 81 MG tablet Take 1 tablet (81 mg total) by mouth 2 (two) times daily. 01/18/24 02/27/24  Georgina Ozell LABOR, MD  polyethylene glycol (MIRALAX / GLYCOLAX) 17 g packet Take 17 g by mouth daily for 14 days. 01/18/24 02/01/24  Georgina Ozell LABOR, MD     Vitals:   01/26/24 0500 01/26/24 0846 01/26/24 1410 01/26/24 1412  BP:  (!) 140/77 (!) 92/53 (!) 94/57  Pulse:  72 76 77  Resp:  19 (!) 23 20  Temp:  98 F (36.7 C) 98.1 F (36.7 C)   TempSrc:  Oral Oral   SpO2:  100% 100% 100%  Weight: 66.1 kg     Height:       Exam Gen alert, no distress Sclera anicteric, throat a bit dry  No jvd or bruits Chest clear bilat to bases RRR no MRG Abd soft ntnd no mass or ascites +bs Ext no LE or UE edema, no other edema R hip wound stapled/ clean Neuro is alert, nf  Home bp  meds: Toprol xl 25 every day Others: tramadol, crestor , sl ntg, MVI, imdur   Date   Creat  eGFR (ml/min) 2016   1.48- 1.95 2019   1.43- 2.20 2020   1.61 Feb-aug 2022  1.42- 1.69 04/06/23  1.50 11/06/23  1.43 10/21- 01/05/24 1.33- 1.70 39- 52 ml/min 01/13/24  1.46  Admit date 11/04   1.86  11/06   2.21 11/08   1.56 11/10   1.39 11/12   1.42 11/14   1.66 01/26/24  1.68  39  Potential ^K+  Ensure plus high protein Nepro carb steady Osmolite 1.5 cal, liquid 720 ml Kphos neutral - used on 11/07 IV Kphos 11/09 30 mmol  Current meds: Asa Vit D3 Nepro carb steady Insulin Imdur  Toprol xl 25mg  daily MVI    UA 11/05: small Hb, 0-5  rbc/ wbc/ epi, neg protein Renal US  11/05: 7.9 / 8.9 cm kidneys, no hydronephrosis, ^'d echotexture bilaterally  IV contrast 75 ml for CTA on 11/03 CTA head/ neck I/O = 11 L in and 14.7 L out = net neg 3.7 L Wts - 67kg on admission, peak 71.5kg, down to 66 kg today  No diuretics Toprol xl only BP lowering medication    Assessment/ Plan: Hyperkalemia: looks a bit dry, some soft BP's too. Not getting low K+ diet. Will give IVF's, hold metoprolol xl for now and give lokelma 10gm tid until K+ < 5.0.  Changed to renal diet w/ DYS 3 in the comments. F/u K+ in am.  CKD 3b: b/l creatinine 1.3- 1.7, eGFR 39- 52 ml/min from October 2025. Creat today is 1.6 within range.  R femoral neck fracture: sp surgery on 11/3 Vol depletion: looks a bit dry on exam, starting IVF's here which should help w/ UOP and getting K+ out of the body.     Myer Fret  MD [facility] 01/26/2024, 4:11 PM  Recent Labs  Lab 01/23/24 0258 01/24/24 0323 01/24/24 1055 01/25/24 0517 01/25/24 1357 01/26/24 0332  HGB 8.1* 8.6*  --   --   --   --   ALBUMIN  1.9* 2.1*  --  2.2*  --   --   CALCIUM  8.5* 8.7*  --  8.8*  --  8.7*  PHOS 2.7 3.0  --   --   --   --   CREATININE 1.42* 1.54*  --  1.66*  --  1.68*  K 5.0 5.4*   < > 5.4* 5.0 5.4*   < > = values in this interval not displayed.    Inpatient medications:  aspirin   81 mg Oral BID   Or   aspirin   300 mg Rectal BID   cholecalciferol  1,000 Units Oral Daily   feeding supplement (NEPRO CARB STEADY)  237 mL Oral TID BM   Influenza vac split trivalent PF  0.5 mL Intramuscular Tomorrow-1000   insulin aspart  0-9 Units Subcutaneous Q4H   isosorbide  mononitrate  30 mg Oral Daily   metoprolol succinate  25 mg Oral Daily   multivitamin with minerals  1 tablet Oral Daily   pneumococcal 20-valent conjugate vaccine  0.5 mL Intramuscular Tomorrow-1000    bisacodyl, hydrALAZINE , HYDROcodone-acetaminophen , menthol, phenol, polyethylene glycol

## 2024-01-26 NOTE — Progress Notes (Signed)
 PROGRESS NOTE    Juan Goodwin  FMW:[id number] DOB: 1935-08-09 DOA: 01/13/2024 PCP: [name], MD   Brief Narrative: Juan Goodwin is a 88 y.o. male with a history of history of AAA, CAD, CKD stage IIIa, subdural hematoma, duodenal ulcer, CAD, hyperlipidemia, primary pretension. Patient presented secondary to a fall and resultant right femoral neck fracture. Orthopedic surgery was consulted and performed a right hip hemiarthroplasty on 11/3. Hospitalization complicated by resultant metabolic encephalopathy of unknown etiology. Mentation improved over time.   Assessment and Plan:  Right femoral neck fracture Secondary to fall. Orthopedic surgery consulted and performed a right hemiarthroplasty on 11/3.  Orthopedic surgery recommendation for weightbearing as tolerated regarding right lower extremity with posterior hip precautions in addition to aspirin  81 mg twice daily for DVT prophylaxis.  Acute metabolic encephalopathy Presumed diagnosis, although unclear etiology. Symptoms occurred post-surgery. CT head and MRI brain without evidence of acute intracranial process. EEG obtained and was negative for evidence of seizure activity. Some worsening renal function with BUN up to 29. TSH, Vitamin B12, folate, ammonia level do not explain symptoms. Resolved.  AKI on CKD stage IIIa Baseline creatinine of about 1.3-1.4. This admission, creatinine up to a peak of 2.57. Worsened with initial IV fluids. Urine output quantity initially not documented. Blood pressure appears to have been adequate during surgery without evidence of significant hypotension. Renal ultrasound significant for evidence of medical renal disease and bladder suggestive of history of retention. AKI resolved. -Strict in/out  Dysphagia Secondary to mental status change.  Patient is seen and evaluated by speech therapy who recommends n.p.o. status.  Discussed with family and decision made to have Cortrak feeding tube placed  temporarily. Speech therapy reevaluated and patient's diet advanced to a dysphagia 3 diet. Oral intake improved and NG tube removed on 11/12. -SLP recommendations (11/7): Diet recommendations: Dysphagia 3 (mechanical soft);Thin liquid Liquids provided via: Cup;Straw Medication Administration: Whole meds with liquid Supervision: Staff to assist with self feeding (set up assist)  Severe malnutrition -Dietitian recommendations (11/11): Encourage PO intake: Nursing to assist with meals Room service with assist  Went over preordering meals with daughter at bedside Increase Ensure Plus High Protein po to TID, each supplement provides 350 kcal and 20 grams of protein Continue Magic cup TID with meals, each supplement provides 290 kcal and 9 grams of protein 100 mg Thiamine x 7 days po MVI with minerals daily po 1,000 units vitamin D  daily po Discontinue Calorie count Remove cortrak tube, discontinue tube feeds  Hyponatremia Mild. Resolved.  Hyperkalemia Mild. Adjusted patient's feeding supplement. Initial improvement but now worsening. -BMP in AM -Continue telemetry -Nephrology consultation  SIRS Patient with temperature of 100.6 F  and WBC of 11,000 on 11/8. Transient. No infectious source identified. Resolved.  Elevated LFTs Mild. Workup unremarkable for etiology. Per GI, possibly ischemic in nature. Improving.  Possible cirrhosis Noted on ultrasound. Outpatient GI follow-up.  History of CAD Noted.  Patient with prior NSTEMI.  Patient is on metoprolol and Crestor  as an outpatient.  Currently on aspirin  for DVT prophylaxis per orthopedic surgery.  Chronic anemia Likely anemia of chronic disease secondary to known CKD. Baseline hemoglobin of around 9-10. Continued to drift down post-operatively to a low of 7.3 and has stabilized. -Trend CBC  Primary hypertension Uncontrolled on admission. -Continue Imdur  and metoprolol  History of abdominal aortic aneurysm Patient is  status post recent infrarenal endovascular aortic repair with coil of the left internal iliac artery  Hypophosphatemia Possibly related to refeeding  syndrome. Resolved.    DVT prophylaxis: Per orthopedic surgery: aspirin  81 mg BID Code Status:   Code Status: Full Code Family Communication: None at bedside. Disposition Plan: SNF pending bed availability and improvement of hyperkalemia   Consultants:  Orthopedic surgery Neurology Gastroenterology  Procedures:  Right hip hemiarthroplasty  Antimicrobials: Vancomycin  Cefazolin    Subjective: Patient reports no concerns this morning.  Objective: BP (!) 140/77 (BP Location: Right Arm)   Pulse 72   Temp 98 F (36.7 C) (Oral)   Resp 19   Ht 6' 1 (1.854 m)   Wt 66.1 kg   SpO2 100%   BMI 19.23 kg/m   Examination:  General exam: Appears calm and comfortable. Finished his breakfast. Respiratory system: Clear to auscultation. Respiratory effort normal. Cardiovascular system: S1 & S2 heard, RRR. No murmur. Gastrointestinal system: Abdomen is nondistended, soft and nontender. Normal bowel sounds heard. Central nervous system: Alert.   Data Reviewed: I have personally reviewed following labs and imaging studies  CBC Lab Results  Component Value Date   WBC 6.6 01/24/2024   RBC 3.33 (L) 01/24/2024   HGB 8.6 (L) 01/24/2024   HCT 26.1 (L) 01/24/2024   MCV 78.4 (L) 01/24/2024   MCH 25.8 (L) 01/24/2024   PLT 446 (H) 01/24/2024   MCHC 33.0 01/24/2024   RDW 17.1 (H) 01/24/2024   LYMPHSABS 1.8 01/24/2024   MONOABS 0.7 01/24/2024   EOSABS 0.2 01/24/2024   BASOSABS 0.1 01/24/2024     Last metabolic panel Lab Results  Component Value Date   NA 135 01/26/2024   K 5.4 (H) 01/26/2024   CL 101 01/26/2024   CO2 26 01/26/2024   BUN 38 (H) 01/26/2024   CREATININE 1.68 (H) 01/26/2024   GLUCOSE 96 01/26/2024   GFRNONAA 39 (L) 01/26/2024   GFRAA 46 (L) 06/08/2018   CALCIUM  8.7 (L) 01/26/2024   PHOS 3.0 01/24/2024   PROT  6.3 (L) 01/25/2024   ALBUMIN  2.2 (L) 01/25/2024   LABGLOB 4.2 (H) 11/06/2023   BILITOT 0.5 01/25/2024   ALKPHOS 111 01/25/2024   AST 229 (H) 01/25/2024   ALT 234 (H) 01/25/2024   ANIONGAP 8 01/26/2024    GFR: Estimated Creatinine Clearance: 29.5 mL/min (A) (by C-G formula based on SCr of 1.68 mg/dL (H)).  Recent Results (from the past 240 hours)  Culture, blood (Routine X 2) w Reflex to ID Panel     Status: None   Collection Time: 01/19/24  9:58 AM   Specimen: BLOOD RIGHT ARM  Result Value Ref Range Status   Specimen Description BLOOD RIGHT ARM  Final   Special Requests   Final    BOTTLES DRAWN AEROBIC AND ANAEROBIC Blood Culture results may not be optimal due to an inadequate volume of blood received in culture bottles   Culture   Final    NO GROWTH 5 DAYS Performed at [facility], [address] [place], [place] [address]    Report Status 01/24/2024 FINAL  Final  Culture, blood (Routine X 2) w Reflex to ID Panel     Status: None   Collection Time: 01/19/24 10:17 AM   Specimen: BLOOD RIGHT HAND  Result Value Ref Range Status   Specimen Description BLOOD RIGHT HAND  Final   Special Requests   Final    BOTTLES DRAWN AEROBIC AND ANAEROBIC Blood Culture results may not be optimal due to an inadequate volume of blood received in culture bottles   Culture   Final    NO GROWTH  5 DAYS Performed at [facility], [address] [place], [place] [address]    Report Status 01/24/2024 FINAL  Final      Radiology Studies: No results found.     LOS: 13 days    Elgin Lam, MD [facility] 01/26/2024, 2:09 PM   If 7PM-7AM, please contact night-coverage [email or web address]

## 2024-01-27 DIAGNOSIS — S72001A Fracture of unspecified part of neck of right femur, initial encounter for closed fracture: Secondary | ICD-10-CM | POA: Diagnosis not present

## 2024-01-27 DIAGNOSIS — E43 Unspecified severe protein-calorie malnutrition: Secondary | ICD-10-CM | POA: Diagnosis not present

## 2024-01-27 DIAGNOSIS — G928 Other toxic encephalopathy: Secondary | ICD-10-CM | POA: Diagnosis not present

## 2024-01-27 DIAGNOSIS — W19XXXA Unspecified fall, initial encounter: Secondary | ICD-10-CM | POA: Diagnosis not present

## 2024-01-27 LAB — CBC WITH DIFFERENTIAL/PLATELET
Abs Immature Granulocytes: 0.02 K/uL (ref 0.00–0.07)
Basophils Absolute: 0.1 K/uL (ref 0.0–0.1)
Basophils Relative: 1 %
Eosinophils Absolute: 0.2 K/uL (ref 0.0–0.5)
Eosinophils Relative: 3 %
HCT: 22.2 % — ABNORMAL LOW (ref 39.0–52.0)
Hemoglobin: 7.4 g/dL — ABNORMAL LOW (ref 13.0–17.0)
Immature Granulocytes: 0 %
Lymphocytes Relative: 32 %
Lymphs Abs: 1.9 K/uL (ref 0.7–4.0)
MCH: 26 pg (ref 26.0–34.0)
MCHC: 33.3 g/dL (ref 30.0–36.0)
MCV: 77.9 fL — ABNORMAL LOW (ref 80.0–100.0)
Monocytes Absolute: 0.6 K/uL (ref 0.1–1.0)
Monocytes Relative: 9 %
Neutro Abs: 3.3 K/uL (ref 1.7–7.7)
Neutrophils Relative %: 55 %
Platelets: 392 K/uL (ref 150–400)
RBC: 2.85 MIL/uL — ABNORMAL LOW (ref 4.22–5.81)
RDW: 17.6 % — ABNORMAL HIGH (ref 11.5–15.5)
WBC: 6 K/uL (ref 4.0–10.5)
nRBC: 0 % (ref 0.0–0.2)

## 2024-01-27 LAB — GLUCOSE, CAPILLARY
Glucose-Capillary: 101 mg/dL — ABNORMAL HIGH (ref 70–99)
Glucose-Capillary: 87 mg/dL (ref 70–99)
Glucose-Capillary: 133 mg/dL — ABNORMAL HIGH (ref 70–99)
Glucose-Capillary: 133 mg/dL — ABNORMAL HIGH (ref 70–99)
Glucose-Capillary: 170 mg/dL — ABNORMAL HIGH (ref 70–99)
Glucose-Capillary: 79 mg/dL (ref 70–99)

## 2024-01-27 LAB — BASIC METABOLIC PANEL WITH GFR
Anion gap: 7 (ref 5–15)
BUN: 35 mg/dL — ABNORMAL HIGH (ref 8–23)
CO2: 25 mmol/L (ref 22–32)
Calcium: 8.4 mg/dL — ABNORMAL LOW (ref 8.9–10.3)
Chloride: 104 mmol/L (ref 98–111)
Creatinine, Ser: 1.37 mg/dL — ABNORMAL HIGH (ref 0.61–1.24)
GFR, Estimated: 50 mL/min — ABNORMAL LOW (ref >60)
Glucose, Bld: 93 mg/dL (ref 70–99)
Potassium: 4.9 mmol/L (ref 3.5–5.1)
Sodium: 136 mmol/L (ref 135–145)

## 2024-01-27 LAB — HEPATIC FUNCTION PANEL
ALT: 118 U/L — ABNORMAL HIGH (ref 0–44)
AST: 86 U/L — ABNORMAL HIGH (ref 15–41)
Albumin: 1.9 g/dL — ABNORMAL LOW (ref 3.5–5.0)
Alkaline Phosphatase: 85 U/L (ref 38–126)
Bilirubin, Direct: <0.1 mg/dL (ref 0.0–0.2)
Total Bilirubin: 0.4 mg/dL (ref 0.0–1.2)
Total Protein: 5.9 g/dL — ABNORMAL LOW (ref 6.5–8.1)

## 2024-01-27 LAB — PHOSPHORUS: Phosphorus: 2.9 mg/dL (ref 2.5–4.6)

## 2024-01-27 LAB — MAGNESIUM: Magnesium: 1.9 mg/dL (ref 1.7–2.4)

## 2024-01-27 MED ORDER — SODIUM CHLORIDE 0.9 % IV SOLN
INTRAVENOUS | Status: DC
Start: 2024-01-27 — End: 2024-01-28

## 2024-01-27 NOTE — Progress Notes (Addendum)
 [facility] Progress Note  Subjective:  Seen in room K+ down 4.9  Presentation summary: 88 y.o. year-old w/ PMH of CAD, CKD 3a SDH, hx AAA, GIB/ duodenal ulcer, HL, HTN, who presented 01/13/24 after falling at home. He had a R femoral neck fracture. Orthopedics did R hip surgery on 11/3. Pt became confused and w/u was done w/o a real cause, nevertheless her mentation improved slowly. The last 3-5 days the K+ level has been up in the 5- 5.5 range, creatinine 1.4- 1.8. Otherwise ready for discharge. We are asked to see for renal failure and hyperkalemia.    Vitals:   01/26/24 2035 01/26/24 2313 01/27/24 0330 01/27/24 0807  BP: (!) 93/57 133/72 112/66 128/75  Pulse: 71 78 78 77  Resp: 17 17 17  (!) 22  Temp: 98.1 F (36.7 C) 98 F (36.7 C) 98.7 F (37.1 C) 99 F (37.2 C)  TempSrc: Oral Oral Oral Oral  SpO2: 100% 100% 100% 100%  Weight:      Height:        Exam: Gen alert, no distress Sclera anicteric, throat a bit dry  No jvd or bruits Chest clear bilat to bases RRR no MRG Abd soft ntnd no mass or ascites +bs Ext no LE or UE edema, no other edema R hip wound stapled/ clean Neuro is alert, nf   Home bp meds: Toprol xl 25 every day Others: tramadol, crestor , sl ntg, MVI, imdur    Date                             Creat               eGFR (ml/min) 2016                            1.48- 1.95 2019                            1.43- 2.20 2020                            1.61 Feb-aug 2022              1.42- 1.69 04/06/23                        1.50 11/06/23                        1.43 10/21- 01/05/24  1.33- 1.70        39- 52 ml/min 01/13/24                      1.46                 Admit date 11/04                           1.86      11/06                           2.21 11/08                           1.56 11/10  1.39 11/12                           1.42 11/14                           1.66 01/26/24                      1.68                  39   Potential ^K+ in med history: Ensure plus high protein Osmolite 1.5 cal, liquid 720 ml Kphos neutral - used on 11/07 IV Kphos 11/09 30 mmol   Current meds: Asa Vit D3 Nepro carb steady Insulin Imdur  Toprol xl 25mg  daily MVI     UA 11/05: small Hb, 0-5 rbc/ wbc/ epi, neg protein Renal US  11/05: 7.9 / 8.9 cm kidneys, no hydronephrosis, ^'d echotexture bilaterally  IV contrast 75 ml for CTA on 11/03 CTA head/ neck I/O = 11 L in and 14.7 L out = net neg 3.7 L Wts - 67kg on admission, peak 71.5kg, down to 66 kg today  No diuretics Toprol xl only BP lowering medication (imdur  maybe)       Assessment/ Plan: Hyperkalemia: looks a bit dry with some soft BP's too. Not getting low K+ diet. DC'd bp lowering meds (metoprolol xl) and hold imdur  if SBP < 120. Try to keep sBP > 115-120 for now. Changed to renal diet w/ DYS 3 in the comments. Got lokelma x 2 yesterday. Keeping BPs up allows for better renal perfusion and more K+ released from the body.  K+ 4.9 today.  CKD 3b: b/l creatinine 1.3- 1.7, eGFR 39- 52 ml/min from October 2025. Creat 1.6 yest, down to 1.3 today. Cont IVFs lower to 85cc/hr.  R femoral neck fracture: sp surgery on 11/3 Vol depletion: looked dry on exam initially- started IVFs.        Myer Fret MD  [facility] 01/27/2024, 10:27 AM  Recent Labs  Lab 01/24/24 0323 01/24/24 1055 01/25/24 0517 01/25/24 1357 01/26/24 0332 01/27/24 0611  HGB 8.6*  --   --   --   --  7.4*  ALBUMIN  2.1*  --  2.2*  --   --  1.9*  CALCIUM  8.7*  --  8.8*  --  8.7* 8.4*  PHOS 3.0  --   --   --   --  2.9  CREATININE 1.54*  --  1.66*  --  1.68* 1.37*  K 5.4*   < > 5.4*   < > 5.4* 4.9   < > = values in this interval not displayed.   No results for input(s): IRON, TIBC, FERRITIN in the last 168 hours. Inpatient medications:  aspirin   81 mg Oral BID   Or   aspirin   300 mg Rectal BID   cholecalciferol  1,000 Units Oral Daily   feeding supplement (NEPRO CARB STEADY)  237 mL  Oral TID BM   Influenza vac split trivalent PF  0.5 mL Intramuscular Tomorrow-1000   insulin aspart  0-9 Units Subcutaneous Q4H   isosorbide  mononitrate  30 mg Oral Daily   multivitamin with minerals  1 tablet Oral Daily   pneumococcal 20-valent conjugate vaccine  0.5 mL Intramuscular Tomorrow-1000   sodium zirconium cyclosilicate  10 g Oral TID    sodium chloride  100 mL/hr at 01/27/24 0329   bisacodyl, hydrALAZINE , HYDROcodone-acetaminophen ,  menthol, phenol, polyethylene glycol

## 2024-01-27 NOTE — Progress Notes (Incomplete)
 PROGRESS NOTE    Juan Goodwin  FMW:[id number] DOB: 03-05-1936 DOA: 01/13/2024 PCP: [name], MD   Brief Narrative:  Juan Goodwin is a 88 y.o. male with a history of AAA, CAD, CKD stage IIIa, subdural hematoma, duodenal ulcer, CAD, hyperlipidemia, primary pretension.  Patient presented secondary to a fall and resultant right femoral neck fracture.  Orthopedic surgery was consulted and performed a right hip hemiarthroplasty on 11/3.  Hospitalization complicated by resultant metabolic encephalopathy of unknown etiology which is slowly improved..  Had a temperature earlier on admission to the low.[name]  LFTs started worsening so GI was consulted and feel there is ischemic hepatitis and they feel that this is reassuring and self-limited and given his slow improvement in LFTs.  His core track was removed and he is tolerating oral intake and diet has been advanced.  Subsequently hospitalization has also been complicated by worsening renal function and hyperkalemia for which nephrology was consulted for. He is getting close to being medically stable for discharge given his improvement.  PT/OT recommending SNF and has a bed available with insurance authorization and likely can be discharged in next 24 hours if cleared.  Assessment and Plan:  Right Femoral Neck Fracture: Secondary to fall. Orthopedic surgery consulted and performed a right hemiarthroplasty on 11/3.  Orthopedic surgery recommendation for weightbearing as tolerated regarding right lower extremity with posterior hip precautions in addition to aspirin  81 mg twice daily for DVT prophylaxis.  Orthopedic surgery is also recommending abduction pillow when in bed and continuing pain control and leave the incision open to air.  They are recommending that that is okay for the soap and water run over her incision but not for submersion. C/w Bowel Regimen and Pain Control.  PT/OT recommending SNF and now will start pursuing given LFTs improving given  that he also has Cortrak removed.    Acute Metabolic Encephalopathy: Presumed diagnosis, although unclear etiology and is improving. Symptoms occurred post-surgery. CT head and MRI brain without evidence of acute intracranial process. EEG obtained and was negative for evidence of seizure activity. Some worsening renal function with BUN up to 32. TSH, Vitamin B12, folate, ammonia level do not explain symptoms (Ammonia Lvl was 31). Mentation continues to improve slowly. Continue to Maintain Delirium Precautions    Dysphagia and poor po intake: Secondary to mental status change.  Patient was seen and evaluated by speech therapy who initially recommended n.p.o. status but now on D3 after SLP re-evaluation.  Failed his calorie count over the weekend but is doing markedly well today so we will remove his core track and repeat calorie count. -Dietitian recommendations (11/11): Encourage PO intake: Nursing to assist with meals Room service with assist  Went over preordering meals with daughter at bedside Increase Ensure Plus High Protein po to TID, each supplement provides 350 kcal and 20 grams of protein Continue Magic cup TID with meals, each supplement provides 290 kcal and 9 grams of protein 100 mg Thiamine x 7 days po MVI with minerals daily po 1,000 units vitamin D  daily po Discontinue Calorie count Remove cortrak tube, discontinue tube feeds  AKI on CKD Stage 3a: Baseline Cr of about 1.3-1.4. This admission, creatinine up to a peak of 2.57 and fluctuated but now at baseline. BUN/Cr Trend is stabilized and improved now and is 35/1.37. Worsened with initial IV fluids. Urine output quantity not documented. Blood pressure appears to have been adequate during surgery without evidence of significant hypotension. Renal ultrasound significant for evidence of  medical renal disease and bladder suggestive of history of retention. Creatinine has reached peak and is trending down. -C/w Strict I's and O's and  CTM UOP. Avoid Nephrotoxic Medications, Contrast Dyes, Hypotension and Dehydration to Ensure Adequate Renal Perfusion and will need to Renally Adjust Meds. CTM & Trend Renal Function carefully & repeat CMP in the AM  -Currently getting IV fluid hydration with fluids but now rate has been reduced to 85 mL/hr and will be continuing x1 Day.  -Nephrology was consulted and feel his renal Fxn is stable but they discontinued some of his blood pressure medications with his blood pressure being soft.  His metoprolol XL has been discontinued and recommendation is to hold Imdur  if his SBP is less than 120.  The goal is to keep his SBP's greater than 150 and 120 for now.  Hyperkalemia: Improved. K+ Trend:  Recent Labs  Lab 01/24/24 0323 01/24/24 1055 01/24/24 1738 01/25/24 0517 01/25/24 1357 01/26/24 0332 01/27/24 0611  K 5.4* 5.5* 5.4* 5.4* 5.0 5.4* 4.9  -Nephrology consulted and recommended Lokelma 10 grams BID; Now getting IVF as below.  Patient's feeding supplements have been adjusted now and has been placed on renal diet.   Hyponatremia: Mild and Resolved. Na+ was 136 on last check. CTM & Trend & repeat CMP in the AM   Abnormal/Elevated LFTs in the setting of ? Liver Cirrhosis: Initially mild but now worsened significantly and now improving. Unclear etiology. AST and ALT Trend now improving: AST went from 931 at its peak and is now 86 and ALT went from 393 at its peak and is now 118 on last check  -Checked RUQ U/S and it showed Echogenic liver parenchyma with subtle nodular appearance of the liver contour raising the question of underlying cirrhosis and  No evidence for cholelithiasis or biliary dilatation.  Acute Hepatitis Panel Non-Reactive.  -Ammonia Lvl normal at 31. GI was consulted for further evaluation and they are recommending continue to monitor LFTs and complete workup with AMA was <20.0, alpha-1 antitrypsin was 267 and ceruloplasmin was 23.9; ASMA pending F-Actin IgG was 14. GI feels it may  may be related to ischemic hepatitis but labs are trending down and reassuring.  GI feels that this is self-limited and should resolve on its own and recommending repeating LFTs in the a.m.   History of CAD: Noted.  Patient with prior NSTEMI.  Patient was on Metoprolol Succinate 25 mg po Daily but now discontinued by Nephrology. Isosorbide  Mononitrate 30 mg po Daily with parameters to hold if SBP <120. Holding Rosuvastatin  given abnormal LFTs. Currently on ASA 81 mg po BID for DVT prophylaxis per Orthopedic Surgery.   Essential Hypertension -> Hypotension: Uncontrolled on admission but BP became soft. Nephrology discontinued Metoprolol Succinate 25 mg po Daily and recommending Holding Isosorbide  Mononitrate 30 mg po Daily if SBP <120. C/w IV Hydralazine  10 mg q4hprn HBP and SBP >160. CTM BP per Protcol. Last BP reading is now 109/66   History of Abdominal Aortic Aneurysm: S/p recent infrarenal endovascular aortic repair with coil of the left internal iliac artery; Will need outpt follow up w/ Vascular Surgery.   Fever: Unclear Etiology but is Resolved; afebrile in the last 24 hours but last Tmax was 99.1. Blood Cx x2 showing NGTD @ 5 Days; DG Chest showed no active Disease. U/A Negative. CTM and Trend and Follow Cx's but he has now been afebrile and has no Leukocytosis   Hypophosphatemia: Phos Lvl is now 2.9. CTM and Replete as Necessary.  Repeat Phos in the AM    Chronic Microcytic Anemia: Likely anemia of chronic disease secondary to known CKD. Baseline hemoglobin of around 9-10. Continued to drift down post-operatively to a low of 7.3 and has stabilized. Hgb/Hct Trend fluctuating but now trended down. Last Hgb/Hct is now 7.4/22.2 w/ MCV of 77.9. Anemia panel done and showed an iron of 26, UIBC 106, TIBC 132, saturation 55%, ferritin 597; FOBT is Negative. CTM for S/Sx of Bleeding; No overt bleeding noted. Repeat CBC in the AM and Transfue for Hgb < 7.0   Hypoalbuminemia: Patient's Albumin  Lvl ranging  from 1.7-3.2. (Was 1.9 this AM). CTM & Trend & repeat CMP in the AM   Severe Malnutrition in the Context of Chronic illness: Nutrition Status: Nutrition Problem: Severe Malnutrition Etiology: chronic illness Signs/Symptoms: severe muscle depletion, severe fat depletion Interventions: Refer to RD note for recommendations;Cortrak removed 11/12 and Ensure Plus high-protein p.o. 3 times daily changed to Nepro carb steady 237 mL p.o. 3 times daily with meals; continue with Magic cup 3 times daily with meals     DVT prophylaxis: SCDs Start: 01/13/24 1538    Code Status: Full Code Family Communication:   Disposition Plan:  Level of care: Progressive Status is: Inpatient {Inpatient:23812}    Consultants:  ***  Procedures:  ***  Antimicrobials:  Anti-infectives (From admission, onward)    Start     Dose/Rate Route Frequency Ordered Stop   01/15/24 0600  ceFAZolin  (ANCEF ) IVPB 2g/100 mL premix        2 g 200 mL/hr over 30 Minutes Intravenous Every 6 hours 01/15/24 0252 01/15/24 1828   01/14/24 1842  vancomycin  (VANCOCIN ) powder  Status:  Discontinued          As needed 01/14/24 1843 01/14/24 2059   01/14/24 0600  ceFAZolin  (ANCEF ) IVPB 2g/100 mL premix        2 g 200 mL/hr over 30 Minutes Intravenous On call to O.R. 01/13/24 1527 01/14/24 1743        Subjective: ***  Objective: Vitals:   01/26/24 2313 01/27/24 0330 01/27/24 0807 01/27/24 1205  BP: 133/72 112/66 128/75 109/66  Pulse: 78 78 77 77  Resp: 17 17 (!) 22 15  Temp: 98 F (36.7 C) 98.7 F (37.1 C) 99 F (37.2 C) 99.3 F (37.4 C)  TempSrc: Oral Oral Oral Oral  SpO2: 100% 100% 100% 100%  Weight:      Height:        Intake/Output Summary (Last 24 hours) at 01/27/2024 1821 Last data filed at 01/27/2024 9191 Gross per 24 hour  Intake 871.44 ml  Output 1500 ml  Net -628.56 ml   Filed Weights   01/24/24 0500 01/25/24 0426 01/26/24 0500  Weight: 67.3 kg 66.3 kg 66.1 kg    Examination: Physical  Exam:  Constitutional: WN/WD, NAD and appears calm and comfortable Eyes: PERRL, lids and conjunctivae normal, sclerae anicteric  ENMT: External Ears, Nose appear normal. Grossly normal hearing. Mucous membranes are moist. Posterior pharynx clear of any exudate or lesions. Normal dentition.  Neck: Appears normal, supple, no cervical masses, normal ROM, no appreciable thyromegaly Respiratory: Clear to auscultation bilaterally, no wheezing, rales, rhonchi or crackles. Normal respiratory effort and patient is not tachypenic. No accessory muscle use.  Cardiovascular: RRR, no murmurs / rubs / gallops. S1 and S2 auscultated. No extremity edema. 2+ pedal pulses. No carotid bruits.  Abdomen: Soft, non-tender, non-distended. No masses palpated. No appreciable hepatosplenomegaly. Bowel sounds positive.  GU: Deferred. Musculoskeletal: No clubbing /  cyanosis of digits/nails. No joint deformity upper and lower extremities. Good ROM, no contractures. Normal strength and muscle tone.  Skin: No rashes, lesions, ulcers. No induration; Warm and dry.  Neurologic: CN 2-12 grossly intact with no focal deficits. Sensation intact in all 4 Extremities, DTR normal. Strength 5/5 in all 4. Romberg sign cerebellar reflexes not assessed.  Psychiatric: Normal judgment and insight. Alert and oriented x 3. Normal mood and appropriate affect.   Data Reviewed: I have personally reviewed following labs and imaging studies  CBC: Recent Labs  Lab 01/21/24 0540 01/22/24 0237 01/23/24 0258 01/24/24 0323 01/27/24 0611  WBC 6.5 6.2 6.6 6.6 6.0  NEUTROABS 4.1 3.6 3.6 3.8 3.3  HGB 8.3* 7.5* 8.1* 8.6* 7.4*  HCT 25.3* 22.6* 24.7* 26.1* 22.2*  MCV 78.6* 78.2* 77.9* 78.4* 77.9*  PLT 357 386 417* 446* 392   Basic Metabolic Panel: Recent Labs  Lab 01/21/24 0540 01/22/24 0237 01/23/24 0258 01/24/24 0323 01/24/24 1055 01/24/24 1738 01/25/24 0517 01/25/24 1357 01/26/24 0332 01/27/24 0611  NA 136 136 137 133*  --   --  134*   --  135 136  K 4.7 5.0 5.0 5.4*   < > 5.4* 5.4* 5.0 5.4* 4.9  CL 101 101 101 98  --   --  100  --  101 104  CO2 27 27 28 28   --   --  26  --  26 25  GLUCOSE 106* 146* 112* 111*  --   --  97  --  96 93  BUN 28* 31* 32* 34*  --   --  37*  --  38* 35*  CREATININE 1.39* 1.46* 1.42* 1.54*  --   --  1.66*  --  1.68* 1.37*  CALCIUM  8.5* 8.4* 8.5* 8.7*  --   --  8.8*  --  8.7* 8.4*  MG 1.7 1.7 2.0 1.9  --   --   --   --   --  1.9  PHOS 2.9 2.8 2.7 3.0  --   --   --   --   --  2.9   < > = values in this interval not displayed.   GFR: Estimated Creatinine Clearance: 36.2 mL/min (A) (by C-G formula based on SCr of 1.37 mg/dL (H)). Liver Function Tests: Recent Labs  Lab 01/22/24 0237 01/23/24 0258 01/24/24 0323 01/25/24 0517 01/27/24 0611  AST 630* 581* 339* 229* 86*  ALT 351* 368* 308* 234* 118*  ALKPHOS 106 112 106 111 85  BILITOT 0.5 0.2 0.3 0.5 0.4  PROT 5.6* 6.0* 6.5 6.3* 5.9*  ALBUMIN  1.8* 1.9* 2.1* 2.2* 1.9*   No results for input(s): LIPASE, AMYLASE in the last 168 hours. Recent Labs  Lab 01/21/24 1013  AMMONIA 31   Coagulation Profile: Recent Labs  Lab 01/21/24 1013  INR 1.2   Cardiac Enzymes: No results for input(s): CKTOTAL, CKMB, CKMBINDEX, TROPONINI in the last 168 hours. BNP (last 3 results) No results for input(s): PROBNP in the last 8760 hours. HbA1C: No results for input(s): HGBA1C in the last 72 hours. CBG: Recent Labs  Lab 01/26/24 2312 01/27/24 0347 01/27/24 0806 01/27/24 1204 01/27/24 1613  GLUCAP 123* 101* 87 133* 133*   Lipid Profile: No results for input(s): CHOL, HDL, LDLCALC, TRIG, CHOLHDL, LDLDIRECT in the last 72 hours. Thyroid  Function Tests: No results for input(s): TSH, T4TOTAL, FREET4, T3FREE, THYROIDAB in the last 72 hours. Anemia Panel: No results for input(s): VITAMINB12, FOLATE, FERRITIN, TIBC, IRON, RETICCTPCT in the last  72 hours. Sepsis Labs: No results for input(s):  PROCALCITON, LATICACIDVEN in the last 168 hours.  Recent Results (from the past 240 hours)  Culture, blood (Routine X 2) w Reflex to ID Panel     Status: None   Collection Time: 01/19/24  9:58 AM   Specimen: BLOOD RIGHT ARM  Result Value Ref Range Status   Specimen Description BLOOD RIGHT ARM  Final   Special Requests   Final    BOTTLES DRAWN AEROBIC AND ANAEROBIC Blood Culture results may not be optimal due to an inadequate volume of blood received in culture bottles   Culture   Final    NO GROWTH 5 DAYS Performed at [facility], [address] [place], [place] [address]    Report Status 01/24/2024 FINAL  Final  Culture, blood (Routine X 2) w Reflex to ID Panel     Status: None   Collection Time: 01/19/24 10:17 AM   Specimen: BLOOD RIGHT HAND  Result Value Ref Range Status   Specimen Description BLOOD RIGHT HAND  Final   Special Requests   Final    BOTTLES DRAWN AEROBIC AND ANAEROBIC Blood Culture results may not be optimal due to an inadequate volume of blood received in culture bottles   Culture   Final    NO GROWTH 5 DAYS Performed at [facility], [address], [place], [place] [address]    Report Status 01/24/2024 FINAL  Final     Radiology Studies: No results found.   Scheduled Meds:  aspirin   81 mg Oral BID   Or   aspirin   300 mg Rectal BID   cholecalciferol  1,000 Units Oral Daily   feeding supplement (NEPRO CARB STEADY)  237 mL Oral TID BM   Influenza vac split trivalent PF  0.5 mL Intramuscular Tomorrow-1000   insulin aspart  0-9 Units Subcutaneous Q4H   isosorbide  mononitrate  30 mg Oral Daily   multivitamin with minerals  1 tablet Oral Daily   pneumococcal 20-valent conjugate vaccine  0.5 mL Intramuscular Tomorrow-1000   Continuous Infusions:  sodium chloride        LOS: 14 days    Time spent: *** minutes spent on chart review, discussion with nursing staff, consultants, updating family and interview/physical exam; more than 50% of  that time was spent in counseling and/or coordination of care.   Alejandro Lazarus Marker, DO [facility] Available via Epic secure chat 7am-7pm After these hours, please refer to coverage provider listed on [email or web address] 01/27/2024, 6:21 PM

## 2024-01-27 NOTE — Progress Notes (Signed)
 PROGRESS NOTE    Juan Goodwin  FMW:[id number] DOB: 20-Feb-1936 DOA: 01/13/2024 PCP: [name], MD   Brief Narrative:  Juan Goodwin is a 88 y.o. male with a history of AAA, CAD, CKD stage IIIa, subdural hematoma, duodenal ulcer, CAD, hyperlipidemia, primary pretension.  Patient presented secondary to a fall and resultant right femoral neck fracture.  Orthopedic surgery was consulted and performed a right hip hemiarthroplasty on 11/3.  Hospitalization complicated by resultant metabolic encephalopathy of unknown etiology which is slowly improved..  Had a temperature earlier on admission to the low.[name]  LFTs started worsening so GI was consulted and feel there is ischemic hepatitis and they feel that this is reassuring and self-limited and given his slow improvement in LFTs.  His core track was removed and he is tolerating oral intake and diet has been advanced.  Subsequently hospitalization has also been complicated by worsening renal function and hyperkalemia for which nephrology was consulted for. He is getting close to being medically stable for discharge given his improvement.  PT/OT recommending SNF and has a bed available with insurance authorization and likely can be discharged in next 24 hours if improved and cleared by Nephrology.  Assessment and Plan:  Right Femoral Neck Fracture: Secondary to fall. Orthopedic surgery consulted and performed a right hemiarthroplasty on 11/3.  Orthopedic surgery recommendation for weightbearing as tolerated regarding right lower extremity with posterior hip precautions in addition to aspirin  81 mg twice daily for DVT prophylaxis.  Orthopedic surgery is also recommending abduction pillow when in bed and continuing pain control and leave the incision open to air.  They are recommending that that is okay for the soap and water run over her incision but not for submersion. C/w Bowel Regimen and Pain Control.  PT/OT recommending SNF and now will start pursuing  given LFTs improving given that he also has Cortrak removed.    Acute Metabolic Encephalopathy: Presumed diagnosis, although unclear etiology and is improving. Symptoms occurred post-surgery. CT head and MRI brain without evidence of acute intracranial process. EEG obtained and was negative for evidence of seizure activity. Some worsening renal function with BUN up to 32. TSH, Vitamin B12, folate, ammonia level do not explain symptoms (Ammonia Lvl was 31). Mentation continues to improve slowly. Continue to Maintain Delirium Precautions    Dysphagia and poor po intake: Secondary to mental status change.  Patient was seen and evaluated by speech therapy who initially recommended n.p.o. status but now on D3 after SLP re-evaluation.  Failed his calorie count over the weekend but is doing markedly well today so we will remove his core track and repeat calorie count. -Dietitian recommendations (11/11): Encourage PO intake: Nursing to assist with meals Room service with assist  Went over preordering meals with daughter at bedside Increase Ensure Plus High Protein po to TID, each supplement provides 350 kcal and 20 grams of protein Continue Magic cup TID with meals, each supplement provides 290 kcal and 9 grams of protein 100 mg Thiamine x 7 days po MVI with minerals daily po 1,000 units vitamin D  daily po Discontinue Calorie count Remove cortrak tube, discontinue tube feeds  AKI on CKD Stage 3a: Baseline Cr of about 1.3-1.4. This admission, creatinine up to a peak of 2.57 and fluctuated but now at baseline. BUN/Cr Trend is stabilized and improved now and is 35/1.37. Worsened with initial IV fluids. Urine output quantity not documented. Blood pressure appears to have been adequate during surgery without evidence of significant hypotension. Renal ultrasound  significant for evidence of medical renal disease and bladder suggestive of history of retention. Creatinine has reached peak and is trending  down. -C/w Strict I's and O's and CTM UOP. Avoid Nephrotoxic Medications, Contrast Dyes, Hypotension and Dehydration to Ensure Adequate Renal Perfusion and will need to Renally Adjust Meds. CTM & Trend Renal Function carefully & repeat CMP in the AM  -Currently getting IV fluid hydration with fluids but now rate has been reduced to 85 mL/hr and will be continuing x1 Day.  -Nephrology was consulted and feel his renal Fxn is stable but they discontinued some of his blood pressure medications with his blood pressure being soft.  His metoprolol XL has been discontinued and recommendation is to hold Imdur  if his SBP is less than 120.  The goal is to keep his SBP's greater than 150 and 120 for now.  Hyperkalemia: Improved. K+ Trend:  Recent Labs  Lab 01/24/24 0323 01/24/24 1055 01/24/24 1738 01/25/24 0517 01/25/24 1357 01/26/24 0332 01/27/24 0611  K 5.4* 5.5* 5.4* 5.4* 5.0 5.4* 4.9  -Nephrology consulted and recommended Lokelma 10 grams BID; Now getting IVF as below.  Patient's feeding supplements have been adjusted now and has been placed on renal diet.   Hyponatremia: Mild and Resolved. Na+ was 136 on last check. CTM & Trend & repeat CMP in the AM   Abnormal/Elevated LFTs in the setting of ? Liver Cirrhosis: Initially mild but now worsened significantly and now improving. Unclear etiology. AST and ALT Trend now improving: AST went from 931 at its peak and is now 86 and ALT went from 393 at its peak and is now 118 on last check  -Checked RUQ U/S and it showed Echogenic liver parenchyma with subtle nodular appearance of the liver contour raising the question of underlying cirrhosis and  No evidence for cholelithiasis or biliary dilatation.  Acute Hepatitis Panel Non-Reactive.  -Ammonia Lvl normal at 31. GI was consulted for further evaluation and they are recommending continue to monitor LFTs and complete workup with AMA was <20.0, alpha-1 antitrypsin was 267 and ceruloplasmin was 23.9; ASMA pending  F-Actin IgG was 14. GI feels it may may be related to ischemic hepatitis but labs are trending down and reassuring.  GI feels that this is self-limited and should resolve on its own and recommending repeating LFTs in the a.m.   History of CAD: Noted.  Patient with prior NSTEMI.  Patient was on Metoprolol Succinate 25 mg po Daily but now discontinued by Nephrology. Isosorbide  Mononitrate 30 mg po Daily with parameters to hold if SBP <120. Holding Rosuvastatin  given abnormal LFTs. Currently on ASA 81 mg po BID for DVT prophylaxis per Orthopedic Surgery.   Essential Hypertension -> Hypotension: Uncontrolled on admission but BP became soft. Nephrology discontinued Metoprolol Succinate 25 mg po Daily and recommending Holding Isosorbide  Mononitrate 30 mg po Daily if SBP <120. C/w IV Hydralazine  10 mg q4hprn HBP and SBP >160. CTM BP per Protcol. Last BP reading is now 109/66   History of Abdominal Aortic Aneurysm: S/p recent infrarenal endovascular aortic repair with coil of the left internal iliac artery; Will need outpt follow up w/ Vascular Surgery.   Fever: Unclear Etiology but is Resolved; afebrile in the last 24 hours but last Tmax was 99.1. Blood Cx x2 showing NGTD @ 5 Days; DG Chest showed no active Disease. U/A Negative. CTM and Trend and Follow Cx's but he has now been afebrile and has no Leukocytosis   Hypophosphatemia: Phos Lvl is now 2.9. CTM  and Replete as Necessary. Repeat Phos in the AM    Chronic Microcytic Anemia: Likely anemia of chronic disease secondary to known CKD. Baseline hemoglobin of around 9-10. Continued to drift down post-operatively to a low of 7.3 and has stabilized. Hgb/Hct Trend fluctuating but now trended down. Last Hgb/Hct is now 7.4/22.2 w/ MCV of 77.9. Anemia panel done and showed an iron of 26, UIBC 106, TIBC 132, saturation 55%, ferritin 597; FOBT is Negative. CTM for S/Sx of Bleeding; No overt bleeding noted. Repeat CBC in the AM and Transfue for Hgb < 7.0    Hypoalbuminemia: Patient's Albumin  Lvl ranging from 1.7-3.2. (Was 1.9 this AM). CTM & Trend & repeat CMP in the AM   Severe Malnutrition in the Context of Chronic illness: Nutrition Status: Nutrition Problem: Severe Malnutrition Etiology: chronic illness Signs/Symptoms: severe muscle depletion, severe fat depletion Interventions: Refer to RD note for recommendations;Cortrak removed 11/12 and Ensure Plus high-protein p.o. 3 times daily changed to Nepro carb steady 237 mL p.o. 3 times daily with meals; continue with Magic cup 3 times daily with meals   DVT prophylaxis: SCDs Start: 01/13/24 1538    Code Status: Full Code Family Communication: No family present @ bedside   Disposition Plan:  Level of care: Progressive Status is: Inpatient Remains inpatient appropriate because: Needs further clinical improvement and anticipating D/C in the next 24 hours   Consultants:  Gastroenterology Neurology Orthopedic Surgery Nephrology   Procedures:  As delineated as above.   Antimicrobials:  Anti-infectives (From admission, onward)    Start     Dose/Rate Route Frequency Ordered Stop   01/15/24 0600  ceFAZolin  (ANCEF ) IVPB 2g/100 mL premix        2 g 200 mL/hr over 30 Minutes Intravenous Every 6 hours 01/15/24 0252 01/15/24 1828   01/14/24 1842  vancomycin  (VANCOCIN ) powder  Status:  Discontinued          As needed 01/14/24 1843 01/14/24 2059   01/14/24 0600  ceFAZolin  (ANCEF ) IVPB 2g/100 mL premix        2 g 200 mL/hr over 30 Minutes Intravenous On call to O.R. 01/13/24 1527 01/14/24 1743       Subjective: Seen and examined at bedside and he is resting and in no acute distress.  Feels okay and denied any complaints or concerns.  No nausea or vomiting.   Objective: Vitals:   01/26/24 2313 01/27/24 0330 01/27/24 0807 01/27/24 1205  BP: 133/72 112/66 128/75 109/66  Pulse: 78 78 77 77  Resp: 17 17 (!) 22 15  Temp: 98 F (36.7 C) 98.7 F (37.1 C) 99 F (37.2 C) 99.3 F (37.4 C)   TempSrc: Oral Oral Oral Oral  SpO2: 100% 100% 100% 100%  Weight:      Height:        Intake/Output Summary (Last 24 hours) at 01/27/2024 1857 Last data filed at 01/27/2024 9191 Gross per 24 hour  Intake 871.44 ml  Output 1500 ml  Net -628.56 ml   Filed Weights   01/24/24 0500 01/25/24 0426 01/26/24 0500  Weight: 67.3 kg 66.3 kg 66.1 kg   Examination: Physical Exam:  Constitutional: Thin chronically ill-appearing elderly African-American male no acute distress appears calm Respiratory: Diminished to auscultation bilaterally, no wheezing, rales, rhonchi or crackles. Normal respiratory effort and patient is not tachypenic. No accessory muscle use.  Unlabored breathing Cardiovascular: RRR, no murmurs / rubs / gallops. S1 and S2 auscultated. No extremity edema.  Abdomen: Soft, non-tender, non-distended. Bowel sounds positive.  GU: Deferred. Musculoskeletal: No clubbing / cyanosis of digits/nails. No joint deformity upper and lower extremities. Skin: No rashes, lesions, ulcers on limited skin evaluation. No induration; Warm and dry.  Neurologic: CN 2-12 grossly intact with no focal deficits. Romberg sign cerebellar reflexes not assessed.  Psychiatric: Is awake and alert and appears calm and has a pleasant mood and affect  Data Reviewed: I have personally reviewed following labs and imaging studies  CBC: Recent Labs  Lab 01/21/24 0540 01/22/24 0237 01/23/24 0258 01/24/24 0323 01/27/24 0611  WBC 6.5 6.2 6.6 6.6 6.0  NEUTROABS 4.1 3.6 3.6 3.8 3.3  HGB 8.3* 7.5* 8.1* 8.6* 7.4*  HCT 25.3* 22.6* 24.7* 26.1* 22.2*  MCV 78.6* 78.2* 77.9* 78.4* 77.9*  PLT 357 386 417* 446* 392   Basic Metabolic Panel: Recent Labs  Lab 01/21/24 0540 01/22/24 0237 01/23/24 0258 01/24/24 0323 01/24/24 1055 01/24/24 1738 01/25/24 0517 01/25/24 1357 01/26/24 0332 01/27/24 0611  NA 136 136 137 133*  --   --  134*  --  135 136  K 4.7 5.0 5.0 5.4*   < > 5.4* 5.4* 5.0 5.4* 4.9  CL 101 101 101  98  --   --  100  --  101 104  CO2 27 27 28 28   --   --  26  --  26 25  GLUCOSE 106* 146* 112* 111*  --   --  97  --  96 93  BUN 28* 31* 32* 34*  --   --  37*  --  38* 35*  CREATININE 1.39* 1.46* 1.42* 1.54*  --   --  1.66*  --  1.68* 1.37*  CALCIUM  8.5* 8.4* 8.5* 8.7*  --   --  8.8*  --  8.7* 8.4*  MG 1.7 1.7 2.0 1.9  --   --   --   --   --  1.9  PHOS 2.9 2.8 2.7 3.0  --   --   --   --   --  2.9   < > = values in this interval not displayed.   GFR: Estimated Creatinine Clearance: 36.2 mL/min (A) (by C-G formula based on SCr of 1.37 mg/dL (H)). Liver Function Tests: Recent Labs  Lab 01/22/24 0237 01/23/24 0258 01/24/24 0323 01/25/24 0517 01/27/24 0611  AST 630* 581* 339* 229* 86*  ALT 351* 368* 308* 234* 118*  ALKPHOS 106 112 106 111 85  BILITOT 0.5 0.2 0.3 0.5 0.4  PROT 5.6* 6.0* 6.5 6.3* 5.9*  ALBUMIN  1.8* 1.9* 2.1* 2.2* 1.9*   No results for input(s): LIPASE, AMYLASE in the last 168 hours. Recent Labs  Lab 01/21/24 1013  AMMONIA 31   Coagulation Profile: Recent Labs  Lab 01/21/24 1013  INR 1.2   Cardiac Enzymes: No results for input(s): CKTOTAL, CKMB, CKMBINDEX, TROPONINI in the last 168 hours. BNP (last 3 results) No results for input(s): PROBNP in the last 8760 hours. HbA1C: No results for input(s): HGBA1C in the last 72 hours. CBG: Recent Labs  Lab 01/26/24 2312 01/27/24 0347 01/27/24 0806 01/27/24 1204 01/27/24 1613  GLUCAP 123* 101* 87 133* 133*   Lipid Profile: No results for input(s): CHOL, HDL, LDLCALC, TRIG, CHOLHDL, LDLDIRECT in the last 72 hours. Thyroid  Function Tests: No results for input(s): TSH, T4TOTAL, FREET4, T3FREE, THYROIDAB in the last 72 hours. Anemia Panel: No results for input(s): VITAMINB12, FOLATE, FERRITIN, TIBC, IRON, RETICCTPCT in the last 72 hours. Sepsis Labs: No results for input(s): PROCALCITON, LATICACIDVEN in the last 168  hours.  Recent Results (from the past 240  hours)  Culture, blood (Routine X 2) w Reflex to ID Panel     Status: None   Collection Time: 01/19/24  9:58 AM   Specimen: BLOOD RIGHT ARM  Result Value Ref Range Status   Specimen Description BLOOD RIGHT ARM  Final   Special Requests   Final    BOTTLES DRAWN AEROBIC AND ANAEROBIC Blood Culture results may not be optimal due to an inadequate volume of blood received in culture bottles   Culture   Final    NO GROWTH 5 DAYS Performed at [facility], [address] [place], [place] [address]    Report Status 01/24/2024 FINAL  Final  Culture, blood (Routine X 2) w Reflex to ID Panel     Status: None   Collection Time: 01/19/24 10:17 AM   Specimen: BLOOD RIGHT HAND  Result Value Ref Range Status   Specimen Description BLOOD RIGHT HAND  Final   Special Requests   Final    BOTTLES DRAWN AEROBIC AND ANAEROBIC Blood Culture results may not be optimal due to an inadequate volume of blood received in culture bottles   Culture   Final    NO GROWTH 5 DAYS Performed at [facility], [address] [place], [place] [address]    Report Status 01/24/2024 FINAL  Final    Radiology Studies: No results found.  Scheduled Meds:  aspirin   81 mg Oral BID   Or   aspirin   300 mg Rectal BID   cholecalciferol  1,000 Units Oral Daily   feeding supplement (NEPRO CARB STEADY)  237 mL Oral TID BM   Influenza vac split trivalent PF  0.5 mL Intramuscular Tomorrow-1000   insulin aspart  0-9 Units Subcutaneous Q4H   isosorbide  mononitrate  30 mg Oral Daily   multivitamin with minerals  1 tablet Oral Daily   pneumococcal 20-valent conjugate vaccine  0.5 mL Intramuscular Tomorrow-1000   Continuous Infusions:  sodium chloride  85 mL/hr at 01/27/24 1848    LOS: 14 days   Alejandro Marker, DO [facility] Available via Epic secure chat 7am-7pm After these hours, please refer to coverage provider listed on [email or web address] 01/27/2024, 6:57 PM

## 2024-01-28 DIAGNOSIS — N183 Chronic kidney disease, stage 3 unspecified: Secondary | ICD-10-CM | POA: Diagnosis not present

## 2024-01-28 DIAGNOSIS — R2681 Unsteadiness on feet: Secondary | ICD-10-CM | POA: Diagnosis not present

## 2024-01-28 DIAGNOSIS — Z9181 History of falling: Secondary | ICD-10-CM | POA: Diagnosis not present

## 2024-01-28 DIAGNOSIS — S72001S Fracture of unspecified part of neck of right femur, sequela: Secondary | ICD-10-CM | POA: Diagnosis not present

## 2024-01-28 DIAGNOSIS — Z96641 Presence of right artificial hip joint: Secondary | ICD-10-CM | POA: Diagnosis not present

## 2024-01-28 DIAGNOSIS — M6281 Muscle weakness (generalized): Secondary | ICD-10-CM | POA: Diagnosis not present

## 2024-01-28 DIAGNOSIS — E875 Hyperkalemia: Secondary | ICD-10-CM | POA: Diagnosis not present

## 2024-01-28 DIAGNOSIS — E440 Moderate protein-calorie malnutrition: Secondary | ICD-10-CM | POA: Diagnosis not present

## 2024-01-28 DIAGNOSIS — R41841 Cognitive communication deficit: Secondary | ICD-10-CM | POA: Diagnosis not present

## 2024-01-28 DIAGNOSIS — Z7401 Bed confinement status: Secondary | ICD-10-CM | POA: Diagnosis not present

## 2024-01-28 DIAGNOSIS — D649 Anemia, unspecified: Secondary | ICD-10-CM | POA: Diagnosis not present

## 2024-01-28 DIAGNOSIS — E785 Hyperlipidemia, unspecified: Secondary | ICD-10-CM | POA: Diagnosis not present

## 2024-01-28 DIAGNOSIS — I714 Abdominal aortic aneurysm, without rupture, unspecified: Secondary | ICD-10-CM | POA: Diagnosis not present

## 2024-01-28 DIAGNOSIS — N179 Acute kidney failure, unspecified: Secondary | ICD-10-CM

## 2024-01-28 DIAGNOSIS — N1831 Chronic kidney disease, stage 3a: Secondary | ICD-10-CM | POA: Diagnosis not present

## 2024-01-28 DIAGNOSIS — W19XXXA Unspecified fall, initial encounter: Secondary | ICD-10-CM | POA: Diagnosis not present

## 2024-01-28 DIAGNOSIS — R7989 Other specified abnormal findings of blood chemistry: Secondary | ICD-10-CM | POA: Diagnosis not present

## 2024-01-28 DIAGNOSIS — E43 Unspecified severe protein-calorie malnutrition: Secondary | ICD-10-CM | POA: Diagnosis not present

## 2024-01-28 DIAGNOSIS — Z471 Aftercare following joint replacement surgery: Secondary | ICD-10-CM | POA: Diagnosis not present

## 2024-01-28 DIAGNOSIS — S72001A Fracture of unspecified part of neck of right femur, initial encounter for closed fracture: Secondary | ICD-10-CM | POA: Diagnosis not present

## 2024-01-28 DIAGNOSIS — R4182 Altered mental status, unspecified: Secondary | ICD-10-CM | POA: Diagnosis not present

## 2024-01-28 DIAGNOSIS — I10 Essential (primary) hypertension: Secondary | ICD-10-CM | POA: Diagnosis not present

## 2024-01-28 DIAGNOSIS — I2510 Atherosclerotic heart disease of native coronary artery without angina pectoris: Secondary | ICD-10-CM | POA: Diagnosis not present

## 2024-01-28 DIAGNOSIS — G928 Other toxic encephalopathy: Secondary | ICD-10-CM | POA: Diagnosis not present

## 2024-01-28 DIAGNOSIS — R1311 Dysphagia, oral phase: Secondary | ICD-10-CM | POA: Diagnosis not present

## 2024-01-28 DIAGNOSIS — E869 Volume depletion, unspecified: Secondary | ICD-10-CM | POA: Diagnosis not present

## 2024-01-28 LAB — CBC WITH DIFFERENTIAL/PLATELET
Abs Immature Granulocytes: 0.01 K/uL (ref 0.00–0.07)
Basophils Absolute: 0 K/uL (ref 0.0–0.1)
Basophils Relative: 1 %
Eosinophils Absolute: 0.3 K/uL (ref 0.0–0.5)
Eosinophils Relative: 4 %
HCT: 22.9 % — ABNORMAL LOW (ref 39.0–52.0)
Hemoglobin: 7.5 g/dL — ABNORMAL LOW (ref 13.0–17.0)
Immature Granulocytes: 0 %
Lymphocytes Relative: 26 %
Lymphs Abs: 1.5 K/uL (ref 0.7–4.0)
MCH: 25.9 pg — ABNORMAL LOW (ref 26.0–34.0)
MCHC: 32.8 g/dL (ref 30.0–36.0)
MCV: 79 fL — ABNORMAL LOW (ref 80.0–100.0)
Monocytes Absolute: 0.5 K/uL (ref 0.1–1.0)
Monocytes Relative: 9 %
Neutro Abs: 3.5 K/uL (ref 1.7–7.7)
Neutrophils Relative %: 60 %
Platelets: 357 K/uL (ref 150–400)
RBC: 2.9 MIL/uL — ABNORMAL LOW (ref 4.22–5.81)
RDW: 17.5 % — ABNORMAL HIGH (ref 11.5–15.5)
WBC: 5.8 K/uL (ref 4.0–10.5)
nRBC: 0 % (ref 0.0–0.2)

## 2024-01-28 LAB — COMPREHENSIVE METABOLIC PANEL WITH GFR
ALT: 96 U/L — ABNORMAL HIGH (ref 0–44)
AST: 66 U/L — ABNORMAL HIGH (ref 15–41)
Albumin: 2 g/dL — ABNORMAL LOW (ref 3.5–5.0)
Alkaline Phosphatase: 100 U/L (ref 38–126)
Anion gap: 8 (ref 5–15)
BUN: 33 mg/dL — ABNORMAL HIGH (ref 8–23)
CO2: 25 mmol/L (ref 22–32)
Calcium: 8.5 mg/dL — ABNORMAL LOW (ref 8.9–10.3)
Chloride: 104 mmol/L (ref 98–111)
Creatinine, Ser: 0.59 mg/dL — ABNORMAL LOW (ref 0.61–1.24)
GFR, Estimated: >60 mL/min (ref >60)
Glucose, Bld: 120 mg/dL — ABNORMAL HIGH (ref 70–99)
Potassium: 4.6 mmol/L (ref 3.5–5.1)
Sodium: 137 mmol/L (ref 135–145)
Total Bilirubin: 0.5 mg/dL (ref 0.0–1.2)
Total Protein: 6 g/dL — ABNORMAL LOW (ref 6.5–8.1)

## 2024-01-28 LAB — RENAL FUNCTION PANEL
Albumin: 1.9 g/dL — ABNORMAL LOW (ref 3.5–5.0)
Anion gap: 5 (ref 5–15)
BUN: 32 mg/dL — ABNORMAL HIGH (ref 8–23)
CO2: 27 mmol/L (ref 22–32)
Calcium: 8.5 mg/dL — ABNORMAL LOW (ref 8.9–10.3)
Chloride: 104 mmol/L (ref 98–111)
Creatinine, Ser: 1.3 mg/dL — ABNORMAL HIGH (ref 0.61–1.24)
GFR, Estimated: 54 mL/min — ABNORMAL LOW (ref >60)
Glucose, Bld: 119 mg/dL — ABNORMAL HIGH (ref 70–99)
Phosphorus: 2.7 mg/dL (ref 2.5–4.6)
Potassium: 4.6 mmol/L (ref 3.5–5.1)
Sodium: 136 mmol/L (ref 135–145)

## 2024-01-28 LAB — PHOSPHORUS: Phosphorus: 2.7 mg/dL (ref 2.5–4.6)

## 2024-01-28 LAB — GLUCOSE, CAPILLARY
Glucose-Capillary: 125 mg/dL — ABNORMAL HIGH (ref 70–99)
Glucose-Capillary: 106 mg/dL — ABNORMAL HIGH (ref 70–99)
Glucose-Capillary: 111 mg/dL — ABNORMAL HIGH (ref 70–99)

## 2024-01-28 LAB — MAGNESIUM: Magnesium: 1.8 mg/dL (ref 1.7–2.4)

## 2024-01-28 MED ORDER — NEPRO/CARBSTEADY PO LIQD: 237.0000 mL | Freq: Three times a day (TID) | ORAL | Status: AC

## 2024-01-28 MED ORDER — PHENOL 1.4 % MT LIQD: 1.0000 | OROMUCOSAL | Status: AC | PRN

## 2024-01-28 MED ORDER — MENTHOL 3 MG MT LOZG: 1.0000 | LOZENGE | OROMUCOSAL | Status: AC | PRN

## 2024-01-28 MED ORDER — VITAMIN D3 25 MCG PO TABS: 1000.0000 [IU] | ORAL_TABLET | Freq: Every day | ORAL | Status: AC

## 2024-01-28 NOTE — Progress Notes (Signed)
 Order to discharge [facility]. Report given to [place] RN, Samantha. Daughter at bedside. Discharge instructions/AVS given to and reviewed with patient and daughter. Education provided as needed. Patient and family verbalized understanding. 1 PIV removed by this RN. Personal belongings sent with the patient's daughter. SNF via PTAR.

## 2024-01-28 NOTE — TOC Transition Note (Addendum)
 Transition of Care [facility]) - Discharge Note   Patient Details  Name: EULA MAZZOLA MRN: [id number] Date of Birth: 1935-07-20  Transition of Care [facility]) CM/SW Contact:  Arlana JINNY Nicholaus ISRAEL Phone Number: [phone] 01/28/2024, 12:51 PM   Clinical Narrative:  SNF choice [facility], ins auth approved, [place] [phone]   CSW placed patients dc summary paperwork on the chart. CSW notified the bedside RN via secure chat that the patient will be dc shortly to [facility]. Waiting for dc summary and notification that the patient is ready before PTAR is called.    2:12 PM - PTAR called.   Room #124; number for report [phone]- CSW notified bedside RN via secure chat.             Patient Goals and CMS Choice            Discharge Placement                       Discharge Plan and Services Additional resources added to the After Visit Summary for                                       Social Drivers of Health (SDOH) Interventions SDOH Screenings   Food Insecurity: No Food Insecurity (01/13/2024)  Housing: Low Risk  (01/13/2024)  Transportation Needs: No Transportation Needs (01/13/2024)  Utilities: Not At Risk (01/13/2024)  Depression (PHQ2-9): Low Risk  (12/07/2023)  Physical Activity: Inactive (11/08/2021)  Social Connections: Unknown (01/13/2024)  Stress: No Stress Concern Present (11/08/2021)  Tobacco Use: Medium Risk (01/14/2024)     Readmission Risk Interventions     No data to display

## 2024-01-28 NOTE — Plan of Care (Signed)
 Problem: Education: Goal: Knowledge of General Education information will improve Description: Including pain rating scale, medication(s)/side effects and non-pharmacologic comfort measures Outcome: Progressing   Problem: Health Behavior/Discharge Planning: Goal: Ability to manage health-related needs will improve Outcome: Progressing   Problem: Clinical Measurements: Goal: Ability to maintain clinical measurements within normal limits will improve Outcome: Progressing Goal: Will remain free from infection Outcome: Progressing Goal: Diagnostic test results will improve Outcome: Progressing Goal: Respiratory complications will improve Outcome: Progressing Goal: Cardiovascular complication will be avoided Outcome: Progressing   Problem: Activity: Goal: Risk for activity intolerance will decrease Outcome: Progressing   Problem: Nutrition: Goal: Adequate nutrition will be maintained Outcome: Progressing   Problem: Coping: Goal: Level of anxiety will decrease Outcome: Progressing   Problem: Elimination: Goal: Will not experience complications related to bowel motility Outcome: Progressing Goal: Will not experience complications related to urinary retention Outcome: Progressing   Problem: Pain Managment: Goal: General experience of comfort will improve and/or be controlled Outcome: Progressing   Problem: Safety: Goal: Ability to remain free from injury will improve Outcome: Progressing   Problem: Skin Integrity: Goal: Risk for impaired skin integrity will decrease Outcome: Progressing   Problem: Education: Goal: Required Educational Video(s) Outcome: Progressing   Problem: Clinical Measurements: Goal: Postoperative complications will be avoided or minimized Outcome: Progressing   Problem: Skin Integrity: Goal: Demonstration of wound healing without infection will improve Outcome: Progressing   Problem: Delirium Goal: STG: [place]'s ability to perform  activities of daily living will improve Outcome: Progressing Goal: STG: Iori's ability to verbalize feelings about their condition will improve Outcome: Progressing   Problem: Education: Goal: Knowledge of disease or condition will improve Outcome: Progressing Goal: Knowledge of secondary prevention will improve (MUST DOCUMENT ALL) Outcome: Progressing Goal: Knowledge of patient specific risk factors will improve (DELETE if not current risk factor) Outcome: Progressing   Problem: Ischemic Stroke/TIA Tissue Perfusion: Goal: Complications of ischemic stroke/TIA will be minimized Outcome: Progressing   Problem: Coping: Goal: Will verbalize positive feelings about self Outcome: Progressing Goal: Will identify appropriate support needs Outcome: Progressing   Problem: Health Behavior/Discharge Planning: Goal: Ability to manage health-related needs will improve Outcome: Progressing Goal: Goals will be collaboratively established with patient/family Outcome: Progressing   Problem: Self-Care: Goal: Ability to participate in self-care as condition permits will improve Outcome: Progressing Goal: Verbalization of feelings and concerns over difficulty with self-care will improve Outcome: Progressing Goal: Ability to communicate needs accurately will improve Outcome: Progressing   Problem: Nutrition: Goal: Risk of aspiration will decrease Outcome: Progressing Goal: Dietary intake will improve Outcome: Progressing   Problem: Education: Goal: Verbalization of understanding the information provided (i.e., activity precautions, restrictions, etc) will improve Outcome: Progressing   Problem: Activity: Goal: Ability to ambulate and perform ADLs will improve Outcome: Progressing   Problem: Clinical Measurements: Goal: Postoperative complications will be avoided or minimized Outcome: Progressing   Problem: Self-Concept: Goal: Ability to maintain and perform role responsibilities to  the fullest extent possible will improve Outcome: Progressing   Problem: Pain Management: Goal: Pain level will decrease Outcome: Progressing   Problem: Education: Goal: Ability to describe self-care measures that may prevent or decrease complications (Diabetes Survival Skills Education) will improve Outcome: Progressing Goal: Individualized Educational Video(s) Outcome: Progressing   Problem: Coping: Goal: Ability to adjust to condition or change in health will improve Outcome: Progressing   Problem: Fluid Volume: Goal: Ability to maintain a balanced intake and output will improve Outcome: Progressing   Problem: Health Behavior/Discharge Planning: Goal: Ability to identify and  utilize available resources and services will improve Outcome: Progressing Goal: Ability to manage health-related needs will improve Outcome: Progressing   Problem: Metabolic: Goal: Ability to maintain appropriate glucose levels will improve Outcome: Progressing   Problem: Nutritional: Goal: Maintenance of adequate nutrition will improve Outcome: Progressing Goal: Progress toward achieving an optimal weight will improve Outcome: Progressing   Problem: Skin Integrity: Goal: Risk for impaired skin integrity will decrease Outcome: Progressing   Problem: Tissue Perfusion: Goal: Adequacy of tissue perfusion will improve Outcome: Progressing

## 2024-01-28 NOTE — Progress Notes (Signed)
 [facility] Progress Note  Subjective:  Has a difficult time communicating.  Denies any complaints.  Potassium normal  Presentation summary: 88 y.o. year-old w/ PMH of CAD, CKD 3a SDH, hx AAA, GIB/ duodenal ulcer, HL, HTN, who presented 01/13/24 after falling at home. He had a R femoral neck fracture. Orthopedics did R hip surgery on 11/3. Pt became confused and w/u was done w/o a real cause, nevertheless her mentation improved slowly. The last 3-5 days the K+ level has been up in the 5- 5.5 range, creatinine 1.4- 1.8. Otherwise ready for discharge. We are asked to see for renal failure and hyperkalemia.    Vitals:   01/28/24 0343 01/28/24 0500 01/28/24 0735 01/28/24 1153  BP: (!) 146/87  (!) 151/89 (!) 152/86  Pulse: 82  73 74  Resp: 17  20 17   Temp: 98.6 F (37 C)  98.5 F (36.9 C) 98.2 F (36.8 C)  TempSrc: Oral  Oral Oral  SpO2: 100%  100% 100%  Weight:  65.4 kg    Height:        Exam: Gen alert, no distress Sclera anicteric, dry mucous membranes No jvd or bruits Bilateral chest rise no increased work of breathing Normal rate, no rub Abd soft and nondistended No lower extremity edema, warm well-perfused   Home bp meds: Toprol xl 25 every day Others: tramadol, crestor , sl ntg, MVI, imdur    Date                             Creat               eGFR (ml/min) 2016                            1.48- 1.95 2019                            1.43- 2.20 2020                            1.61 Feb-aug 2022              1.42- 1.69 04/06/23                        1.50 11/06/23                        1.43 10/21- 01/05/24  1.33- 1.70        39- 52 ml/min 01/13/24                      1.46                 Admit date 11/04                           1.86      11/06                           2.21 11/08                           1.56 11/10  1.39 11/12                           1.42 11/14                           1.66 01/26/24                       1.68                 39   Potential ^K+ in med history: Ensure plus high protein Osmolite 1.5 cal, liquid 720 ml Kphos neutral - used on 11/07 IV Kphos 11/09 30 mmol   Current meds: Asa Vit D3 Nepro carb steady Insulin Imdur  Toprol xl 25mg  daily MVI     UA 11/05: small Hb, 0-5 rbc/ wbc/ epi, neg protein Renal US  11/05: 7.9 / 8.9 cm kidneys, no hydronephrosis, ^'d echotexture bilaterally  IV contrast 75 ml for CTA on 11/03 CTA head/ neck I/O = 11 L in and 14.7 L out = net neg 3.7 L Wts - 67kg on admission, peak 71.5kg, down to 66 kg today  No diuretics Toprol xl only BP lowering medication (imdur  maybe)       Assessment/ Plan: Hyperkalemia: Had some mild hyperkalemia now resolved.  Not a concern CKD 3b: b/l creatinine 1.3- 1.7, eGFR 39- 52 ml/min from October 2025.  He remains within his baseline.  Patient to follow-up with his primary care provider after discharge R femoral neck fracture: sp surgery on 11/3 Vol depletion: Continue fluids as needed    We will sign off at this time.   01/28/2024, 12:18 PM  Recent Labs  Lab 01/27/24 0611 01/28/24 0532 01/28/24 0533  HGB 7.4* 7.5*  --   ALBUMIN  1.9* 2.0* 1.9*  CALCIUM  8.4* 8.5* 8.5*  PHOS 2.9 2.7 2.7  CREATININE 1.37* 0.59* 1.30*  K 4.9 4.6 4.6   No results for input(s): IRON, TIBC, FERRITIN in the last 168 hours. Inpatient medications:  aspirin   81 mg Oral BID   Or   aspirin   300 mg Rectal BID   cholecalciferol  1,000 Units Oral Daily   feeding supplement (NEPRO CARB STEADY)  237 mL Oral TID BM   Influenza vac split trivalent PF  0.5 mL Intramuscular Tomorrow-1000   insulin aspart  0-9 Units Subcutaneous Q4H   isosorbide  mononitrate  30 mg Oral Daily   multivitamin with minerals  1 tablet Oral Daily   pneumococcal 20-valent conjugate vaccine  0.5 mL Intramuscular Tomorrow-1000    sodium chloride  85 mL/hr at 01/28/24 0554   bisacodyl, hydrALAZINE , HYDROcodone-acetaminophen , menthol, phenol,  polyethylene glycol

## 2024-01-28 NOTE — Discharge Summary (Signed)
 Physician Discharge Summary   Patient: Juan Goodwin MRN: [id number] DOB: [birth year]  Admit date:     01/13/2024  Discharge date: 01/28/24  Discharge Physician: Alejandro Marker, DO   PCP: [name], MD   Recommendations at discharge:   Follow-up with PCP within 1 to 2 weeks repeat CBC, CMP, mag and Phos within 1 week Follow-up with orthopedic surgery within 1 to 2 weeks Follow-up with nephrology in outpatient setting as necessary  Discharge Diagnoses: Principal Problem:   Fall Active Problems:   Closed displaced fracture of right femoral neck (HCC)   Protein-calorie malnutrition, severe   Closed right hip fracture (HCC)   Toxic metabolic encephalopathy  Resolved Problems:   * No resolved hospital problems. *  Hospital Course: Juan Goodwin is a 88 y.o. male with a history of AAA, CAD, CKD stage IIIa, subdural hematoma, duodenal ulcer, CAD, hyperlipidemia, primary pretension.  Patient presented secondary to a fall and resultant right femoral neck fracture.  Orthopedic surgery was consulted and performed a right hip hemiarthroplasty on 11/3.  Hospitalization complicated by resultant metabolic encephalopathy of unknown etiology which is slowly improved..  Had a temperature earlier on admission to the low.Juan Goodwin  LFTs started worsening so GI was consulted and feel there is ischemic hepatitis and they feel that this is reassuring and self-limited and given his slow improvement in LFTs.  His core track was removed and he is tolerating oral intake and diet has been advanced.  Subsequently hospitalization has also been complicated by worsening renal function and hyperkalemia for which nephrology was consulted for. He is getting close to being medically stable for discharge given his improvement.  PT/OT recommending SNF and has a bed available with insurance authorization and will be discharged today given that he is cleared by the Nephrology team.  He is medically stable for discharge at this  time and will need to follow-up with PCP, and orthopedic surgery within 1 to 2 weeks  Assessment and Plan:  Right Femoral Neck Fracture: Secondary to fall. Orthopedic surgery consulted and performed a right hemiarthroplasty on 11/3.  Orthopedic surgery recommendation for weightbearing as tolerated regarding right lower extremity with posterior hip precautions in addition to aspirin  81 mg twice daily for DVT prophylaxis.  Orthopedic surgery is also recommending abduction pillow when in bed and continuing pain control and leave the incision open to air.  They are recommending that that is okay for the soap and water run over her incision but not for submersion. C/w Bowel Regimen and Pain Control.  PT/OT recommending SNF and now will start pursuing given LFTs improving given that he also has Cortrak removed.    Acute Metabolic Encephalopathy: Presumed diagnosis, although unclear etiology and is improving. Symptoms occurred post-surgery. CT head and MRI brain without evidence of acute intracranial process. EEG obtained and was negative for evidence of seizure activity. Some worsening renal function with BUN up to 32. TSH, Vitamin B12, folate, ammonia level do not explain symptoms (Ammonia Lvl was 31). Mentation continues to improve slowly and appears baseline. Continue to Maintain Delirium Precautions even at SNF    Dysphagia and poor po intake: Secondary to mental status change.  Patient was seen and evaluated by speech therapy who initially recommended n.p.o. status but now on D3 after SLP re-evaluation and will continue at.  Initially failed his calorie count but then improved -Dietitian recommendations (11/11): Encourage PO intake: Nursing to assist with meals Room service with assist  Went over preordering meals with daughter at  bedside Increase Ensure Plus High Protein po to TID, each supplement provides 350 kcal and 20 grams of protein Continue Magic cup TID with meals, each supplement provides 290  kcal and 9 grams of protein 100 mg Thiamine x 7 days po MVI with minerals daily po 1,000 units vitamin D  daily po Discontinue Calorie count Remove cortrak tube, discontinue tube feeds  AKI on CKD Stage 3a: Baseline Cr of about 1.3-1.4. This admission, creatinine up to a peak of 2.57 and fluctuated but now at baseline. BUN/Cr Trend is stabilized and improved now and is 32/1.30. Worsened with initial IV fluids. Urine output quantity not documented. Blood pressure appears to have been adequate during surgery without evidence of significant hypotension. Renal ultrasound significant for evidence of medical renal disease and bladder suggestive of history of retention. Creatinine has reached peak and is trending down. -C/w Strict I's and O's and CTM UOP. Avoid Nephrotoxic Medications, Contrast Dyes, Hypotension and Dehydration to Ensure Adequate Renal Perfusion and will need to Renally Adjust Meds. CTM & Trend Renal Function carefully & repeat CMP in the AM  -Currently getting IV fluid hydration with fluids but now rate has been reduced to 85 mL/hr and will be continuing x1 Day.  -Nephrology was consulted and feel his renal Fxn is stable but they discontinued some of his blood pressure medications with his blood pressure being soft.  His metoprolol XL has been discontinued and recommendation is to hold Imdur  if his SBP is less than 120.  The goal is to keep his SBP's greater than 150 and 120 for now.  Hyperkalemia: Improved. K+ Trend:  Recent Labs  Lab 01/24/24 0323 01/24/24 1055 01/24/24 1738 01/25/24 0517 01/25/24 1357 01/26/24 0332 01/27/24 0611  K 5.4* 5.5* 5.4* 5.4* 5.0 5.4* 4.9  -Nephrology consulted and recommended Lokelma 10 grams BID; Now getting IVF as below.  Patient's feeding supplements have been adjusted now and has been placed on renal diet.  Follow-up and repeat CMP within 1 week   Hyponatremia: Mild and Resolved. Na+ was 136 on last check. CTM & Trend & repeat CMP in the AM    Abnormal/Elevated LFTs in the setting of ? Liver Cirrhosis: Initially mild but now worsened significantly and now improving. Unclear etiology. AST and ALT Trend now improving: AST went from 931 at its peak and is now 86 and ALT went from 393 at its peak and is now 118 on last check  -Checked RUQ U/S and it showed Echogenic liver parenchyma with subtle nodular appearance of the liver contour raising the question of underlying cirrhosis and  No evidence for cholelithiasis or biliary dilatation.  Acute Hepatitis Panel Non-Reactive.  -Ammonia Lvl normal at 31. GI was consulted for further evaluation and they are recommending continue to monitor LFTs and complete workup with AMA was <20.0, alpha-1 antitrypsin was 267 and ceruloplasmin was 23.9; ASMA pending F-Actin IgG was 14. GI feels it may may be related to ischemic hepatitis but labs are trending down and reassuring.  GI feels that this is self-limited and should resolve on its own and recommending repeating LFTs in the a.m.   History of CAD: Noted.  Patient with prior NSTEMI.  Patient was on Metoprolol Succinate 25 mg po Daily but now discontinued by Nephrology. Isosorbide  Mononitrate 30 mg po Daily with parameters to hold if SBP <120. Holding Rosuvastatin  given abnormal LFTs. Currently on ASA 81 mg po BID for DVT prophylaxis per Orthopedic Surgery.   Essential Hypertension -> Hypotension: Uncontrolled on admission but  BP became soft. Nephrology discontinued Metoprolol Succinate 25 mg po Daily and recommending Holding Isosorbide  Mononitrate 30 mg po Daily if SBP <120. C/w IV Hydralazine  10 mg q4hprn HBP and SBP >160. CTM BP per Protcol. Last BP reading is now 152/86   History of Abdominal Aortic Aneurysm: S/p recent infrarenal endovascular aortic repair with coil of the left internal iliac artery; Will need outpt follow up w/ Vascular Surgery.   Fever: Unclear Etiology but is Resolved; afebrile in the last 24 hours but last Tmax was 99.1. Blood Cx x2  showing NGTD @ 5 Days; DG Chest showed no active Disease. U/A Negative. CTM and Trend and Follow Cx's but he has now been afebrile and has no Leukocytosis   Hypophosphatemia: Phos Lvl is now 2.7. CTM and Replete as Necessary. Repeat Phos in the AM    Chronic Microcytic Anemia: Likely anemia of chronic disease secondary to known CKD. Baseline hemoglobin of around 9-10. Continued to drift down post-operatively to a low of 7.3 and has stabilized. Hgb/Hct Trend fluctuating but now trended down. Last Hgb/Hct is now 7.5/22.9 w/ MCV of 79.0. Anemia panel done and showed an iron of 26, UIBC 106, TIBC 132, saturation 55%, ferritin 597; FOBT is Negative. CTM for S/Sx of Bleeding; No overt bleeding noted. Repeat CBC in the AM and Transfue for Hgb < 7.0   Hypoalbuminemia: Patient's Albumin  Lvl ranging from 1.7-3.2. (Was 1.9 this AM). CTM & Trend & repeat CMP in the AM   Severe Malnutrition in the Context of Chronic illness: Nutrition Status: Nutrition Problem: Severe Malnutrition Etiology: chronic illness Signs/Symptoms: severe muscle depletion, severe fat depletion Interventions: Refer to RD note for recommendations;Cortrak removed 11/12 and Ensure Plus high-protein p.o. 3 times daily changed to Nepro carb steady 237 mL p.o. 3 times daily with meals; continue with Magic cup 3 times daily with meals  Consultants: Gastroenterology Neurology Orthopedic Surgery Nephrology   Procedures performed: As delineated as above   Disposition: Skilled nursing facility  Diet recommendation:  Discharge Diet Orders (From admission, onward)     Start     Ordered   01/28/24 0000  Diet - low sodium heart healthy       Comments: Dysphagia 3 Diet   01/28/24 1311           Dysphagia type 3 Thin Liquid  DISCHARGE MEDICATION: Allergies as of 01/28/2024       Reactions   Cardura [doxazosin Mesylate] Other (See Comments)   Dizziness  Syncope         Medication List     PAUSE taking these medications     traMADol 50 MG tablet Wait to take this until: February 01, 2024 Commonly known as: ULTRAM Take 1 tablet (50 mg total) by mouth every 6 (six) hours as needed for moderate pain (pain score 4-6).       STOP taking these medications    acetaminophen  500 MG tablet Commonly known as: TYLENOL    metoprolol succinate 25 MG 24 hr tablet Commonly known as: Toprol XL       TAKE these medications    aspirin  EC 81 MG tablet Commonly known as: Aspirin  Low Dose Take 1 tablet (81 mg total) by mouth 2 (two) times daily. What changed: when to take this   feeding supplement (NEPRO CARB STEADY) Liqd Take 237 mLs by mouth 3 (three) times daily between meals.   isosorbide  mononitrate 30 MG 24 hr tablet Commonly known as: IMDUR  TAKE 1 TABLET(30 MG) BY MOUTH DAILY  menthol 3 MG lozenge Commonly known as: CEPACOL Take 1 lozenge (3 mg total) by mouth as needed for sore throat.   Multivitamin Men 50+ Tabs Take 1 tablet by mouth daily.   nitroGLYCERIN  0.4 MG SL tablet Commonly known as: Nitrostat  Place 1 tablet (0.4 mg total) under the tongue every 5 (five) minutes as needed for chest pain.   phenol 1.4 % Liqd Commonly known as: CHLORASEPTIC Use as directed 1 spray in the mouth or throat as needed for throat irritation / pain.   polyethylene glycol 17 g packet Commonly known as: MIRALAX / GLYCOLAX Take 17 g by mouth daily for 14 days.   rosuvastatin  40 MG tablet Commonly known as: CRESTOR  Take 1 tablet (40 mg total) by mouth daily. What changed: when to take this   senna 8.6 MG Tabs tablet Commonly known as: SENOKOT Take 1 tablet (8.6 mg total) by mouth in the morning and at bedtime for 14 days.   vitamin D3 25 MCG tablet Commonly known as: CHOLECALCIFEROL Take 1 tablet (1,000 Units total) by mouth daily. Start taking on: January 29, 2024       ASK your doctor about these medications    HYDROcodone-acetaminophen  5-325 MG tablet Commonly known as: NORCO/VICODIN Take  1 tablet by mouth every 4 (four) hours as needed for up to 7 days for severe pain (pain score 7-10) (MILD PAIN). Ask about: Should I take this medication?               Discharge Care Instructions  (From admission, onward)           Start     Ordered   01/28/24 0000  Discharge wound care:       Comments: Per Orthopedic Surgery   01/28/24 1311            Discharge Exam: Filed Weights   01/25/24 0426 01/26/24 0500 01/28/24 0500  Weight: 66.3 kg 66.1 kg 65.4 kg   Vitals:   01/28/24 0735 01/28/24 1153  BP: (!) 151/89 (!) 152/86  Pulse: 73 74  Resp: 20 17  Temp: 98.5 F (36.9 C) 98.2 F (36.8 C)  SpO2: 100% 100%   Examination: Physical Exam:  Constitutional: Thin chronically ill-appearing African-American elderly male in no acute distress appears calm Respiratory: Diminished to auscultation bilaterally, no wheezing, rales, rhonchi or crackles. Normal respiratory effort and patient is not tachypenic. No accessory muscle use.  Unlabored breathing Cardiovascular: RRR, no murmurs / rubs / gallops. S1 and S2 auscultated. No extremity edema.  Abdomen: Soft, non-tender, non-distended. Bowel sounds positive.  GU: Deferred. Musculoskeletal: No clubbing / cyanosis of digits/nails. No joint deformity upper and lower extremities.  Skin: No rashes, lesions, ulcers on limited skin evaluation. No induration; Warm and dry.  Neurologic: CN 2-12 grossly intact with no focal deficits. Romberg sign and cerebellar reflexes not assessed.  Psychiatric: He is awake and alert and appears calm and has a pleasant mood and affect  Condition at discharge: stable  The results of significant diagnostics from this hospitalization (including imaging, microbiology, ancillary and laboratory) are listed below for reference.   Imaging Studies: US  Abdomen Limited RUQ (LIVER/GB) Result Date: 01/21/2024 CLINICAL DATA:  Elevated LFTs. EXAM: ULTRASOUND ABDOMEN LIMITED RIGHT UPPER QUADRANT  COMPARISON:  CT scan 11/27/2023. FINDINGS: Gallbladder: No gallstones or wall thickening visualized. No sonographic Murphy sign noted by sonographer. Common bile duct: Diameter: 5 mm Liver: Liver parenchyma is echogenic. Subtle nodular appearance of the liver contour (see images 34 and 31) raises  the question of underlying cirrhosis. Portal vein is patent on color Doppler imaging with normal direction of blood flow towards the liver. Other: None. IMPRESSION: 1. Echogenic liver parenchyma with subtle nodular appearance of the liver contour raising the question of underlying cirrhosis. 2. No evidence for cholelithiasis or biliary dilatation. Electronically Signed   By: Camellia Candle M.D.   On: 01/21/2024 06:41   DG CHEST PORT 1 VIEW Result Date: 01/19/2024 EXAM: 1 VIEW(S) XRAY OF THE CHEST 01/19/2024 10:30:00 AM COMPARISON: 01/16/2024 CLINICAL HISTORY: SIRS (systemic inflammatory response syndrome) (HCC) FINDINGS: LINES, TUBES AND DEVICES: Feeding tube extends beyond the inferior aspect of the film. Multiple wires and leads project over the chest on the frontal radiograph. LUNGS AND PLEURA: Interstitial coarsening, likely related to smoking / chronic bronchitis. No pulmonary edema. No pleural effusion. No pneumothorax. HEART AND MEDIASTINUM: Mild cardiomegaly. No acute abnormality of the mediastinal silhouette. BONES AND SOFT TISSUES: No acute osseous abnormality. IMPRESSION: 1. No acute findings. Electronically signed by: Rockey Kilts MD 01/19/2024 03:19 PM EST RP Workstation: [id number]   DG CHEST PORT 1 VIEW Result Date: 01/16/2024 CLINICAL DATA:  Tachypnea EXAM: PORTABLE CHEST 1 VIEW COMPARISON:  10/11/2020 FINDINGS: Single frontal view of the chest demonstrates enteric catheter passing below diaphragm, tip projecting over the gastric body. Cardiac silhouette is unremarkable. No acute airspace disease, effusion, or pneumothorax. No acute bony abnormalities. IMPRESSION: 1. No acute intrathoracic process.  Electronically Signed   By: Ozell Daring M.D.   On: 01/16/2024 20:09   US  RENAL Result Date: 01/16/2024 CLINICAL DATA:  Acute kidney injury. EXAM: RENAL / URINARY TRACT ULTRASOUND COMPLETE COMPARISON:  September 23, 2004 FINDINGS: Right Kidney: Renal measurements: 7.9 cm x 4.3 cm x 4.8 cm = volume: 85.3 mL. There is increased echogenicity of the renal parenchyma. No mass or hydronephrosis visualized. Left Kidney: Renal measurements: 8.9 cm x 4.8 cm x 4.2 cm = volume: 94.5 mL. There is increased echogenicity of the renal parenchyma. 3.2 cm x 2.6 cm x 2.5 cm and 0.7 cm x 0.8 cm x 0.7 cm simple cysts are seen within the upper pole of the left kidney. No hydronephrosis is visualized. Bladder: The urinary bladder wall is trabeculated in appearance. Other: The prostate gland measures 2.9 cm x 2.9 cm x 4.6 cm. The study is technically difficult secondary to limited patient positioning, as per the ultrasound technologist. IMPRESSION: 1. Bilateral echogenic kidneys which may represent sequelae associated with medical renal disease. 2. Simple left renal cysts. 3. Urinary bladder trabeculation which may represent sequelae associated with chronic urinary bladder outlet obstruction. Electronically Signed   By: Suzen Dials M.D.   On: 01/16/2024 19:37   EEG adult Result Date: 01/15/2024 Juan Arlin KIDD, MD     01/15/2024  5:56 PM Patient Name: Juan Goodwin MRN: [id number] Epilepsy Attending: Arlin Goodwin Juan Referring Physician/Provider: Khaliqdina, Salman, MD Date: 01/15/2024 Duration: 26.18 mins Patient history: 88yo M with ams. EEG to evaluate for seizure Level of alertness: Awake AEDs during EEG study: None Technical aspects: This EEG study was done with scalp electrodes positioned according to the 10-20 International system of electrode placement. Electrical activity was reviewed with band pass filter of 1-70Hz , sensitivity of 7 uV/mm, display speed of 5mm/sec with a 60Hz  notched filter applied as appropriate. EEG  data were recorded continuously and digitally stored.  Video monitoring was available and reviewed as appropriate. Description: The posterior dominant rhythm consists of 8-9 Hz activity of moderate voltage (25-35 uV) seen predominantly in posterior head regions,  symmetric and reactive to eye opening and eye closing. EEG showed intermittent generalized 3 to 6 Hz theta-delta slowing admixed with 13-15 Hz beta activity distributed symmetrically and diffusely. Hyperventilation and photic stimulation were not performed.   ABNORMALITY - Intermittent slow, generalized IMPRESSION: This study is suggestive of generalized cerebral dysfunction (encephalopathy). No seizures or epileptiform discharges were seen throughout the recording. Arlin MALVA Krebs   DG Pelvis 1-2 Views Result Date: 01/15/2024 CLINICAL DATA:  Right hip prosthesis placement for a subcapital right femoral neck fracture. EXAM: PELVIS - 1-2 VIEW COMPARISON:  01/13/2024 FINDINGS: Interval right hip bipolar prosthesis in satisfactory position and alignment. No interval fracture or dislocation. Diffuse osteopenia. Stable vascular stents and coils. IMPRESSION: Satisfactory postoperative appearance of a right hip bipolar prosthesis. Electronically Signed   By: Elspeth Bathe M.D.   On: 01/15/2024 16:14   MR BRAIN WO CONTRAST Result Date: 01/15/2024 EXAM: MRI BRAIN WITHOUT CONTRAST 01/15/2024 11:06:00 AM TECHNIQUE: Multiplanar multisequence MRI of the head/brain was performed without the administration of intravenous contrast. COMPARISON: MRI head June 08, 2018 CLINICAL HISTORY: Neuro deficit, acute, stroke suspected FINDINGS: BRAIN AND VENTRICLES: No acute infarct. No acute intracranial hemorrhage. Subtle chronic superficial siderosis along the left sylvian fissure. No mass. No midline shift. No hydrocephalus. Small remote lacunar infarcts in the basal ganglia and mild for age T2/FLAIR hyperintensities in the white matter, compatible with chronic microvascular  ischemic change. Normal flow voids. ORBITS: No acute abnormality. SINUSES AND MASTOIDS: No acute abnormality. BONES AND SOFT TISSUES: Normal marrow signal. IMPRESSION: 1. No acute intracranial abnormality. 2. Similar mild for age chronic microvascular ischemic change. 3. Subtle chronic superficial siderosis along the left sylvian fissure. Electronically signed by: Gilmore Molt MD 01/15/2024 12:08 PM EST RP Workstation: [id number]   CT ANGIO HEAD NECK W WO CM (CODE STROKE) Result Date: 01/15/2024 EXAM: CTA HEAD AND NECK WITH AND WITHOUT 01/14/2024 11:11:11 PM TECHNIQUE: CTA of the head and neck was performed with and without the administration of 75 mL of intravenous iohexol  (OMNIPAQUE ) 350 MG/ML injection. Multiplanar 2D and/or 3D reformatted images are provided for review. Automated exposure control, iterative reconstruction, and/or weight based adjustment of the mA/kV was utilized to reduce the radiation dose to as low as reasonably achievable. Stenosis of the internal carotid arteries measured using NASCET criteria. COMPARISON: None available CLINICAL HISTORY: Neuro deficit, acute, stroke suspected. FINDINGS: CTA NECK: AORTIC ARCH AND ARCH VESSELS: Standard aortic arch branching pattern with predominantly soft plaque and scattered small areas of plaque ulceration. No dissection or arterial injury. No significant stenosis of the brachiocephalic or subclavian arteries. CERVICAL CAROTID ARTERIES: No dissection, arterial injury, or hemodynamically significant stenosis by NASCET criteria. CERVICAL VERTEBRAL ARTERIES: No dissection, arterial injury, or significant stenosis. LUNGS AND MEDIASTINUM: Unremarkable. SOFT TISSUES: 1.4 cm thyroid  nodule for which no follow up imaging is recommended. BONES: Moderately advanced cervical disc degeneration and asymmetric right sided facet arthrosis. CTA HEAD: ANTERIOR CIRCULATION: The intracranial internal carotid arteries are patent with diffuse atherosclerotic irregularity  but no significant stenosis. ACAs and MCAs are patent with widespread atherosclerotic irregularity but no evidence of a proximal branch occlusion. There are mild right and moderate left M1 stenoses and moderate bilateral M2 stenoses. There is also a moderate distal right A2 stenosis. No aneurysm. POSTERIOR CIRCULATION: The intracranial vertebral arteries are patent to the basilar with mild atherosclerotic irregularity bilaterally as well as a mild stenosis of the distal right V4 segment at the vertebrobasilar junction. The basilar artery is patent with diffuse atherosclerotic irregularity and a  moderate stenosis proximally. There are small posterior communicating arteries bilaterally. The PCAs are patent with diffuse atherosclerotic irregularity including moderate narrowing of the right P1 and proximal P2 segments as well as a severe distal left P2 stenosis. No aneurysm. OTHER: No dural venous sinus thrombosis on this non-dedicated study. IMPRESSION: 1. No large vessel occlusion. 2. Widespread intracranial atherosclerosis with multiple predominantly moderate anterior and posterior circulation stenoses as above. 3. Widely patent cervical carotid and vertebral arteries. Electronically signed by: Dasie Hamburg MD 01/15/2024 08:26 AM EST RP Workstation: [id number]   CT HEAD CODE STROKE WO CONTRAST Result Date: 01/14/2024 EXAM: CT HEAD WITHOUT CONTRAST 01/14/2024 11:04:44 PM TECHNIQUE: CT of the head was performed without the administration of intravenous contrast. Automated exposure control, iterative reconstruction, and/or weight based adjustment of the mA/kV was utilized to reduce the radiation dose to as low as reasonably achievable. COMPARISON: CT head 12/29/2017 CLINICAL HISTORY: Neuro deficit, acute, stroke suspected. FINDINGS: BRAIN AND VENTRICLES: No acute hemorrhage. No evidence of acute infarct. Small remote left caudate and thalamic lacunar infarcts. No hydrocephalus. No extra-axial collection. No mass  effect or midline shift. ORBITS: No acute abnormality. SINUSES: No acute abnormality. SOFT TISSUES AND SKULL: No skull fracture. Findings conveyed to Dr. Vanessa via pager at 11:15 PM. IMPRESSION: 1. No acute intracranial abnormality. 2. Small remote left caudate and thalamic lacunar infarcts. Electronically signed by: Gilmore Molt MD 01/14/2024 11:15 PM EST RP Workstation: [id number]   DG Hip Unilat W or Wo Pelvis 2-3 Views Right Result Date: 01/13/2024 CLINICAL DATA:  Clemens last week, inability to ambulate, right hip pain EXAM: DG HIP (WITH OR WITHOUT PELVIS) 2-3V RIGHT COMPARISON:  11/29/2023 FINDINGS: Frontal view of the pelvis as well as frontal and frogleg lateral views of the right hip are obtained. There is an impacted subcapital right femoral neck fracture with varus angulation at the fracture site. Left hip is unremarkable. Remainder of the bony pelvis appears normal. Embolic coils left hemipelvis. And luminal stent graft within the abdominal aorta and iliac vessels. IMPRESSION: 1. Acute subcapital right femoral neck fracture with impaction and varus angulation. Electronically Signed   By: Ozell Daring M.D.   On: 01/13/2024 14:12   HYBRID OR IMAGING ([facility] ONLY) Result Date: 01/04/2024 There is no interpretation for this exam.  This order is for images obtained during a surgical procedure.  Please See Surgeries Tab for more information regarding the procedure.   Microbiology: Results for orders placed or performed during the hospital encounter of 01/13/24  Surgical pcr screen     Status: None   Collection Time: 01/14/24  4:47 AM   Specimen: Nasal Mucosa; Nasal Swab  Result Value Ref Range Status   MRSA, PCR NEGATIVE NEGATIVE Final   Staphylococcus aureus NEGATIVE NEGATIVE Final    Comment: (NOTE) The Xpert SA Assay (FDA approved for NASAL specimens in patients 51 years of age and older), is one component of a comprehensive surveillance program. It is not intended to diagnose  infection nor to guide or monitor treatment. Performed at [facility], [address], [place], [place] [address]   Culture, blood (Routine X 2) w Reflex to ID Panel     Status: None   Collection Time: 01/19/24  9:58 AM   Specimen: BLOOD RIGHT ARM  Result Value Ref Range Status   Specimen Description BLOOD RIGHT ARM  Final   Special Requests   Final    BOTTLES DRAWN AEROBIC AND ANAEROBIC Blood Culture results may not be optimal due to  an inadequate volume of blood received in culture bottles   Culture   Final    NO GROWTH 5 DAYS Performed at [facility], [address] [place], [place] [address]    Report Status 01/24/2024 FINAL  Final  Culture, blood (Routine X 2) w Reflex to ID Panel     Status: None   Collection Time: 01/19/24 10:17 AM   Specimen: BLOOD RIGHT HAND  Result Value Ref Range Status   Specimen Description BLOOD RIGHT HAND  Final   Special Requests   Final    BOTTLES DRAWN AEROBIC AND ANAEROBIC Blood Culture results may not be optimal due to an inadequate volume of blood received in culture bottles   Culture   Final    NO GROWTH 5 DAYS Performed at [facility], [address] [place], [place] [address]    Report Status 01/24/2024 FINAL  Final   Labs: CBC: Recent Labs  Lab 01/22/24 0237 01/23/24 0258 01/24/24 0323 01/27/24 0611 01/28/24 0532  WBC 6.2 6.6 6.6 6.0 5.8  NEUTROABS 3.6 3.6 3.8 3.3 3.5  HGB 7.5* 8.1* 8.6* 7.4* 7.5*  HCT 22.6* 24.7* 26.1* 22.2* 22.9*  MCV 78.2* 77.9* 78.4* 77.9* 79.0*  PLT 386 417* 446* 392 357   Basic Metabolic Panel: Recent Labs  Lab 01/22/24 0237 01/23/24 0258 01/24/24 0323 01/24/24 1055 01/25/24 0517 01/25/24 1357 01/26/24 0332 01/27/24 0611 01/28/24 0532 01/28/24 0533  NA 136 137 133*  --  134*  --  135 136 137 136  K 5.0 5.0 5.4*   < > 5.4* 5.0 5.4* 4.9 4.6 4.6  CL 101 101 98  --  100  --  101 104 104 104  CO2 27 28 28   --  26  --  26 25 25 27   GLUCOSE 146* 112* 111*  --  97  --  96 93  120* 119*  BUN 31* 32* 34*  --  37*  --  38* 35* 33* 32*  CREATININE 1.46* 1.42* 1.54*  --  1.66*  --  1.68* 1.37* 0.59* 1.30*  CALCIUM  8.4* 8.5* 8.7*  --  8.8*  --  8.7* 8.4* 8.5* 8.5*  MG 1.7 2.0 1.9  --   --   --   --  1.9 1.8  --   PHOS 2.8 2.7 3.0  --   --   --   --  2.9 2.7 2.7   < > = values in this interval not displayed.   Liver Function Tests: Recent Labs  Lab 01/23/24 0258 01/24/24 0323 01/25/24 0517 01/27/24 0611 01/28/24 0532 01/28/24 0533  AST 581* 339* 229* 86* 66*  --   ALT 368* 308* 234* 118* 96*  --   ALKPHOS 112 106 111 85 100  --   BILITOT 0.2 0.3 0.5 0.4 0.5  --   PROT 6.0* 6.5 6.3* 5.9* 6.0*  --   ALBUMIN  1.9* 2.1* 2.2* 1.9* 2.0* 1.9*   CBG: Recent Labs  Lab 01/27/24 2018 01/27/24 2312 01/28/24 0345 01/28/24 0822 01/28/24 1203  GLUCAP 170* 79 125* 106* 111*   Discharge time spent: greater than 30 minutes.  Signed: Alejandro Marker, DO [facility] 01/28/2024

## 2024-01-29 DIAGNOSIS — S72001S Fracture of unspecified part of neck of right femur, sequela: Secondary | ICD-10-CM | POA: Diagnosis not present

## 2024-01-29 DIAGNOSIS — I2510 Atherosclerotic heart disease of native coronary artery without angina pectoris: Secondary | ICD-10-CM | POA: Diagnosis not present

## 2024-01-29 DIAGNOSIS — E785 Hyperlipidemia, unspecified: Secondary | ICD-10-CM | POA: Diagnosis not present

## 2024-01-30 ENCOUNTER — Encounter: Admitting: Orthopedic Surgery

## 2024-02-01 DIAGNOSIS — D649 Anemia, unspecified: Secondary | ICD-10-CM | POA: Diagnosis not present

## 2024-02-01 DIAGNOSIS — N183 Chronic kidney disease, stage 3 unspecified: Secondary | ICD-10-CM | POA: Diagnosis not present

## 2024-02-01 DIAGNOSIS — I10 Essential (primary) hypertension: Secondary | ICD-10-CM | POA: Diagnosis not present

## 2024-02-06 DIAGNOSIS — R41841 Cognitive communication deficit: Secondary | ICD-10-CM | POA: Diagnosis not present

## 2024-02-06 DIAGNOSIS — S72001S Fracture of unspecified part of neck of right femur, sequela: Secondary | ICD-10-CM | POA: Diagnosis not present

## 2024-02-06 DIAGNOSIS — R2681 Unsteadiness on feet: Secondary | ICD-10-CM | POA: Diagnosis not present

## 2024-02-06 DIAGNOSIS — I714 Abdominal aortic aneurysm, without rupture, unspecified: Secondary | ICD-10-CM | POA: Diagnosis not present

## 2024-02-06 DIAGNOSIS — N183 Chronic kidney disease, stage 3 unspecified: Secondary | ICD-10-CM | POA: Diagnosis not present

## 2024-02-06 DIAGNOSIS — Z9181 History of falling: Secondary | ICD-10-CM | POA: Diagnosis not present

## 2024-02-06 DIAGNOSIS — M6281 Muscle weakness (generalized): Secondary | ICD-10-CM | POA: Diagnosis not present

## 2024-02-06 DIAGNOSIS — E440 Moderate protein-calorie malnutrition: Secondary | ICD-10-CM | POA: Diagnosis not present

## 2024-02-06 DIAGNOSIS — Z471 Aftercare following joint replacement surgery: Secondary | ICD-10-CM | POA: Diagnosis not present

## 2024-02-06 DIAGNOSIS — R1311 Dysphagia, oral phase: Secondary | ICD-10-CM | POA: Diagnosis not present

## 2024-02-06 DIAGNOSIS — E43 Unspecified severe protein-calorie malnutrition: Secondary | ICD-10-CM | POA: Diagnosis not present

## 2024-02-06 DIAGNOSIS — G928 Other toxic encephalopathy: Secondary | ICD-10-CM | POA: Diagnosis not present

## 2024-02-11 ENCOUNTER — Other Ambulatory Visit: Payer: Self-pay

## 2024-02-11 ENCOUNTER — Ambulatory Visit: Admitting: Orthopedic Surgery

## 2024-02-11 DIAGNOSIS — S72001A Fracture of unspecified part of neck of right femur, initial encounter for closed fracture: Secondary | ICD-10-CM

## 2024-02-11 NOTE — Progress Notes (Signed)
 Orthopedic Surgery Post-operative Office Visit  Procedure: right hip cemented hemiarthroplasty Date of Surgery: 01/14/2024 (~2 weeks post-op)  Assessment: Patient is a 88 y.o. who is doing as expected after surgery   Plan: -Operative plans complete -Weight bearing as tolerated right lower extremity with posterior hip precautions -DVT ppx: ASA 81mg  BID for 4 more weeks -Staples removed today in office -Okay to let soap/water  run over incision but do not submerge -Pain management: tylenol  as needed -Continue to work with PT -Return to office in 4 weeks, x-rays needed at next visit: low set AP pelvis  ___________________________________________________________________________   Subjective: Patient was discharged to a skilled nursing facility after his hospitalization.  His hip pain has been getting better with time.  He has been working with PT at the facility.  He said he is ambulating short distances with a walker.  Has not noticed any redness or drainage around his incision.  Objective:  General: no acute distress, appropriate affect Neurologic: alert, answering questions appropriately, following commands Respiratory: unlabored breathing on room air Skin: incision is well approximated with no erythema, induration, active/expressible drainage  MSK (RLE): leg lengths appear symmetric, EHL/TA/GSC intact, SILT in s/s/dp/sp/t nerve distributions, foot warm and well perfused  Imaging: X-rays of the pelvis from 02/11/2024 were independently reviewed and interpreted, showing a cemented hemiarthroplasty in place. No fracture seen. No dislocation seen.    Patient name: Juan Goodwin Patient MRN: [id number] Date of visit: 02/11/24

## 2024-02-13 DIAGNOSIS — R1311 Dysphagia, oral phase: Secondary | ICD-10-CM | POA: Diagnosis not present

## 2024-02-13 DIAGNOSIS — Z9181 History of falling: Secondary | ICD-10-CM | POA: Diagnosis not present

## 2024-02-13 DIAGNOSIS — M6281 Muscle weakness (generalized): Secondary | ICD-10-CM | POA: Diagnosis not present

## 2024-02-13 DIAGNOSIS — E440 Moderate protein-calorie malnutrition: Secondary | ICD-10-CM | POA: Diagnosis not present

## 2024-02-13 DIAGNOSIS — S72001S Fracture of unspecified part of neck of right femur, sequela: Secondary | ICD-10-CM | POA: Diagnosis not present

## 2024-02-13 DIAGNOSIS — R2681 Unsteadiness on feet: Secondary | ICD-10-CM | POA: Diagnosis not present

## 2024-02-13 DIAGNOSIS — Z471 Aftercare following joint replacement surgery: Secondary | ICD-10-CM | POA: Diagnosis not present

## 2024-02-14 ENCOUNTER — Encounter: Admitting: Vascular Surgery

## 2024-02-19 DIAGNOSIS — S72001S Fracture of unspecified part of neck of right femur, sequela: Secondary | ICD-10-CM | POA: Diagnosis not present

## 2024-02-19 DIAGNOSIS — R2681 Unsteadiness on feet: Secondary | ICD-10-CM | POA: Diagnosis not present

## 2024-02-19 DIAGNOSIS — M6281 Muscle weakness (generalized): Secondary | ICD-10-CM | POA: Diagnosis not present

## 2024-02-21 DIAGNOSIS — R899 Unspecified abnormal finding in specimens from other organs, systems and tissues: Secondary | ICD-10-CM | POA: Diagnosis not present

## 2024-02-25 NOTE — Addendum Note (Signed)
 Addended by: DAYNE SHERRY RAMAN on: 02/25/2024 12:29 PM   Modules accepted: Orders

## 2024-02-28 ENCOUNTER — Other Ambulatory Visit: Payer: Self-pay | Admitting: Hematology and Oncology

## 2024-02-28 DIAGNOSIS — D539 Nutritional anemia, unspecified: Secondary | ICD-10-CM

## 2024-03-04 ENCOUNTER — Telehealth: Payer: Self-pay

## 2024-03-04 NOTE — Telephone Encounter (Signed)
 [facility] would like an order to change OT evaluation visit to next week.  CB# [phone].  Office Number [phone].  Please advise.  Thank you.

## 2024-03-05 NOTE — Telephone Encounter (Signed)
 I called the number given for OT, vm just said Richard, so I did not leave a message, so I called the office and was able to give auth to Shona that it was ok to evaluate next week.

## 2024-03-10 ENCOUNTER — Other Ambulatory Visit: Payer: Self-pay

## 2024-03-10 ENCOUNTER — Ambulatory Visit (INDEPENDENT_AMBULATORY_CARE_PROVIDER_SITE_OTHER): Admitting: Orthopedic Surgery

## 2024-03-10 DIAGNOSIS — S72001A Fracture of unspecified part of neck of right femur, initial encounter for closed fracture: Secondary | ICD-10-CM

## 2024-03-10 NOTE — Progress Notes (Signed)
 Orthopedic Surgery Post-operative Office Visit   Procedure: right hip cemented hemiarthroplasty Date of Surgery: 01/14/2024 (~6 weeks post-op)   Assessment: Patient is a 88 y.o. who is doing as expected after surgery     Plan: -Operative plans complete -Weight bearing as tolerated right lower extremity with posterior hip precautions -DVT ppx: none  -Okay to submerge wound at this point -Pain management: tylenol  as needed -Return to office in 6 weeks, x-rays needed at next visit: low set AP pelvis   ___________________________________________________________________________     Subjective: Patient has been doing well since he was last in the office.  He has been working with physical therapy.  He is not taking any medications regularly for pain.  He sometimes has to take a Tylenol  after working with therapy.  His daughter said that he has been able to get himself to the bathroom and is moving without issue.  He is still not quite back to his baseline amount of ambulation prior to this injury.  Has not noticed any redness or drainage around his incision.   Objective:   General: no acute distress, appropriate affect Neurologic: alert, answering questions appropriately, following commands Respiratory: unlabored breathing on room air Skin: incision is well healed with no erythema, induration, active/expressible drainage   MSK (RLE): leg lengths appear symmetric, EHL/TA/GSC intact, SILT in s/s/dp/sp/t nerve distributions, foot warm and well perfused   Imaging: XR of the pelvis from 03/10/2024 was independently reviewed and interpreted, showing a cemented hemiarthroplasty in place.  No dislocation seen.  No fracture seen.    Patient name: Juan Goodwin Patient MRN: [id number] Date of visit: 03/10/2024

## 2024-03-11 ENCOUNTER — Inpatient Hospital Stay (HOSPITAL_BASED_OUTPATIENT_CLINIC_OR_DEPARTMENT_OTHER): Admitting: Hematology and Oncology

## 2024-03-11 ENCOUNTER — Inpatient Hospital Stay: Attending: Hematology and Oncology

## 2024-03-11 ENCOUNTER — Other Ambulatory Visit (HOSPITAL_COMMUNITY): Payer: Self-pay | Admitting: Hematology and Oncology

## 2024-03-11 VITALS — BP 115/77 | HR 66 | Temp 97.6°F | Resp 18 | Wt 153.0 lb

## 2024-03-11 DIAGNOSIS — D631 Anemia in chronic kidney disease: Secondary | ICD-10-CM | POA: Insufficient documentation

## 2024-03-11 DIAGNOSIS — D539 Nutritional anemia, unspecified: Secondary | ICD-10-CM

## 2024-03-11 DIAGNOSIS — N183 Chronic kidney disease, stage 3 unspecified: Secondary | ICD-10-CM

## 2024-03-11 DIAGNOSIS — D509 Iron deficiency anemia, unspecified: Secondary | ICD-10-CM | POA: Diagnosis present

## 2024-03-11 LAB — CBC WITH DIFFERENTIAL (CANCER CENTER ONLY)
Abs Immature Granulocytes: 0.01 K/uL (ref 0.00–0.07)
Basophils Absolute: 0 K/uL (ref 0.0–0.1)
Basophils Relative: 1 %
Eosinophils Absolute: 0.4 K/uL (ref 0.0–0.5)
Eosinophils Relative: 8 %
HCT: 30.3 % — ABNORMAL LOW (ref 39.0–52.0)
Hemoglobin: 9.9 g/dL — ABNORMAL LOW (ref 13.0–17.0)
Immature Granulocytes: 0 %
Lymphocytes Relative: 32 %
Lymphs Abs: 1.5 K/uL (ref 0.7–4.0)
MCH: 25.4 pg — ABNORMAL LOW (ref 26.0–34.0)
MCHC: 32.7 g/dL (ref 30.0–36.0)
MCV: 77.9 fL — ABNORMAL LOW (ref 80.0–100.0)
Monocytes Absolute: 0.4 K/uL (ref 0.1–1.0)
Monocytes Relative: 8 %
Neutro Abs: 2.4 K/uL (ref 1.7–7.7)
Neutrophils Relative %: 51 %
Platelet Count: 251 K/uL (ref 150–400)
RBC: 3.89 MIL/uL — ABNORMAL LOW (ref 4.22–5.81)
RDW: 17.2 % — ABNORMAL HIGH (ref 11.5–15.5)
WBC Count: 4.7 K/uL (ref 4.0–10.5)
nRBC: 0 % (ref 0.0–0.2)

## 2024-03-11 LAB — IRON AND IRON BINDING CAPACITY (CC-WL,HP ONLY)
Iron: 27 ug/dL — ABNORMAL LOW (ref 45–182)
Saturation Ratios: 14 % — ABNORMAL LOW (ref 17.9–39.5)
TIBC: 196 ug/dL — ABNORMAL LOW (ref 250–450)
UIBC: 169 ug/dL

## 2024-03-11 LAB — SAMPLE TO BLOOD BANK

## 2024-03-11 LAB — FERRITIN: Ferritin: 365 ng/mL — ABNORMAL HIGH (ref 24–336)

## 2024-03-11 NOTE — Assessment & Plan Note (Addendum)
 He has chronic kidney disease There could be a component of anemia chronic kidney disease After adequate iron replacement therapy, he can benefit from ESA

## 2024-03-11 NOTE — Progress Notes (Signed)
 "  [facility] OFFICE PROGRESS NOTE  Juan Carlin Redbird, MD  ASSESSMENT & PLAN:  Assessment & Plan Deficiency anemia The cause of his anemia is multifactorial, likely a combination of iron  deficiency anemia and anemia of chronic kidney disease Iron  studies confirm persistent deficiency but much improved after recent intravenous iron  infusion I recommend another course of IV iron  infusion and he agrees I plan to see him again in 3 months for further follow-up Stage 3 chronic kidney disease, unspecified whether stage 3a or 3b CKD (HCC) He has chronic kidney disease There could be a component of anemia chronic kidney disease After adequate iron  replacement therapy, he can benefit from ESA    Orders Placed This Encounter  Procedures   Basic Metabolic Panel - Cancer Center Only    Standing Status:   Future    Expiration Date:   03/11/2025   CBC with Differential (Cancer Center Only)    Standing Status:   Future    Expiration Date:   03/11/2025   Iron  and Iron  Binding Capacity ([facility] only)    Standing Status:   Future    Expiration Date:   03/11/2025   Ferritin    Standing Status:   Future    Expiration Date:   03/11/2025   Vitamin B12    Standing Status:   Future    Expiration Date:   03/11/2025   Erythropoietin     Standing Status:   Future    Expiration Date:   03/11/2025    INTERVAL HISTORY: Patient returns for recurrent anemia Symptoms of anemia includes fatigue We reviewed CBC result  SUMMARY OF HEMATOLOGIC HISTORY:  Juan Goodwin 88 y.o. male is here because of anemia He is here accompanied by his daughter due to his cognitive decline He was found to have abnormal CBC from recent blood work I have the opportunity to review his CBC dated back to 2016 However, according to electronic records, he had received intravenous iron  infusion in 2013, records not available  On July 10, 2014, he presented with low blood count with a hemoglobin of 5.9 He  received blood transfusion and significant investigations including EGD evaluation On July 11, 2014, EGD showed pyloric ulcer with stigmata of bleeding.  It was treated with epinephrine  injection and proton pump inhibitor. Biopsy was taken which show evidence of Helicobacter pylori gastritis with intestinal metaplasia The patient was treated with triple therapy On March 26, 2017, his blood count was normal with hemoglobin of 13.7 He was admitted to the hospital after a fall and CT head show subdural hematoma requiring surgery On June 08, 2018, CBC was normal with hemoglobin of 13 On April 25, 2020, blood count show drop of hemoglobin to 9.1.  The patient was admitted to the hospital due to chest pain and was found to have chronic kidney disease.  He was hospitalized for non-STEMI ST elevation MI, treated with medical management.  At that point in time, he was noted to have iron  deficiency anemia and received 1 dose of intravenous iron  infusion With IV Feraheme . On October 11, 2020, repeat CBC showed hemoglobin of 12.6 On November 01, 2021, hemoglobin was 11.6 Most recently, he saw his primary care doctor and had repeat labs on October 11, 2023 which showed Hemoglobin 10.2, MCV 77.8, platelet count 147, A1c of 6% and creatinine of 1.7 He denies recent chest pain on exertion, shortness of breath on minimal exertion, pre-syncopal episodes, or palpitations. He had not noticed any recent bleeding  such as epistaxis, hematuria or hematochezia The patient has intermittent use of NSAID. He is on antiplatelets agents. His last colonoscopy was several years ago but he cannot recall the date of his procedure He had no prior history or diagnosis of cancer. His age appropriate screening programs are up-to-date. He denies any pica and eats a variety of diet.  However, he only eats 2 meals a day, typically lunch and dinner and most of his food choices are poor in protein/iron  rich meals He had donated blood when  he was younger but has not donated since over 20 years ago.  He received blood transfusion in 2016 as above  Further workup from August 2025 revealed combination of anemia chronic kidney disease and iron  deficiency.  The patient started taking oral iron  supplement consistently between August to September 2025 He received 5 doses of intravenous iron  sucrose  Lab Results  Component Value Date   VITAMINB12 2,699 (H) 01/15/2024   FERRITIN 597 (H) 01/17/2024   HGB 9.9 (L) 03/11/2024   RBC 3.89 (L) 03/11/2024   Vitals:   03/11/24 1303  BP: 115/77  Pulse: 66  Resp: 18  Temp: 97.6 F (36.4 C)  SpO2: 100%   "

## 2024-03-11 NOTE — Assessment & Plan Note (Addendum)
 The cause of his anemia is multifactorial, likely a combination of iron  deficiency anemia and anemia of chronic kidney disease Iron  studies confirm persistent deficiency but much improved after recent intravenous iron  infusion I recommend another course of IV iron  infusion and he agrees I plan to see him again in 3 months for further follow-up

## 2024-03-12 LAB — BUN+CREAT
BUN/Creatinine Ratio: 15 (ref 10–24)
BUN: 19 mg/dL (ref 8–27)
Creatinine, Ser: 1.25 mg/dL (ref 0.76–1.27)
eGFR: 56 mL/min/1.73 — ABNORMAL LOW

## 2024-03-13 ENCOUNTER — Telehealth: Payer: Self-pay

## 2024-03-13 NOTE — Telephone Encounter (Signed)
 Dr. Lonn, patient will be scheduled as soon as possible.  Auth Submission: NO AUTH NEEDED Site of care: Site of care: [facility] Payer: [employer] medicare Medication & CPT/J Code(s) submitted: Venofer  (Iron  Sucrose) J1756 Diagnosis Code:  Route of submission (phone, fax, portal):  Phone # Fax # Auth type: Buy/Bill PB Units/visits requested: 200mg  x 5 doses Reference number:  Approval from: 03/13/24 to 08/10/24

## 2024-03-18 ENCOUNTER — Telehealth: Payer: Self-pay

## 2024-03-18 NOTE — Telephone Encounter (Signed)
 I called and spoke with Charlie an gave him the verbal auth.

## 2024-03-18 NOTE — Telephone Encounter (Signed)
 Richard, OT with [facility] just evaluated the patient. Requesting VO for 1 wk 1, 2 wk 2, and then 1 wk 1 for additional reassessment. [phone]. OK to leave message on his confidential vm.

## 2024-03-20 ENCOUNTER — Emergency Department (HOSPITAL_COMMUNITY)
Admission: EM | Admit: 2024-03-20 | Discharge: 2024-03-20 | Disposition: A | Discharge status: Home / Self Care | Source: Ambulatory Visit | Attending: Emergency Medicine | Admitting: Emergency Medicine

## 2024-03-20 ENCOUNTER — Encounter (HOSPITAL_COMMUNITY): Payer: Self-pay | Admitting: Emergency Medicine

## 2024-03-20 ENCOUNTER — Other Ambulatory Visit: Payer: Self-pay

## 2024-03-20 ENCOUNTER — Emergency Department (HOSPITAL_COMMUNITY)

## 2024-03-20 ENCOUNTER — Ambulatory Visit (INDEPENDENT_AMBULATORY_CARE_PROVIDER_SITE_OTHER)

## 2024-03-20 VITALS — BP 160/86 | HR 71 | Temp 98.0°F | Resp 20 | Ht 74.0 in | Wt 152.0 lb

## 2024-03-20 DIAGNOSIS — S0990XA Unspecified injury of head, initial encounter: Secondary | ICD-10-CM | POA: Diagnosis present

## 2024-03-20 DIAGNOSIS — D539 Nutritional anemia, unspecified: Secondary | ICD-10-CM

## 2024-03-20 DIAGNOSIS — W19XXXA Unspecified fall, initial encounter: Secondary | ICD-10-CM | POA: Insufficient documentation

## 2024-03-20 DIAGNOSIS — Y92531 Health care provider office as the place of occurrence of the external cause: Secondary | ICD-10-CM | POA: Diagnosis not present

## 2024-03-20 MED ORDER — IRON SUCROSE 200 MG IVPB - SIMPLE MED
200.0000 mg | Freq: Once | Status: AC
  Administered 2024-03-20: 200 mg via INTRAVENOUS
  Filled 2024-03-20: qty 110

## 2024-03-20 MED ORDER — ACETAMINOPHEN 325 MG PO TABS
650.0000 mg | ORAL_TABLET | Freq: Once | ORAL | Status: AC
  Administered 2024-03-20: 650 mg via ORAL
  Filled 2024-03-20: qty 2

## 2024-03-20 NOTE — ED Triage Notes (Signed)
 BIB EMS from pulmonary clinic where he was getting iron  infusion.  Tripped on rug with walker and fell forward.  Did stroke head. No external trauma. No complaints. No thinners. No LOC.  Clinic requested he come to the ED to be evaluated.  VSS

## 2024-03-20 NOTE — Discharge Instructions (Signed)
 Today you were seen for mechanical fall.  Your workup while in the emergency department was reassuring.  Thank you for letting us  treat you today. After reviewing your imaging, I feel you are safe to go home. Please follow up with your PCP in the next several days and provide them with your records from this visit. Return to the Emergency Room if pain becomes severe or symptoms worsen.

## 2024-03-20 NOTE — ED Provider Notes (Signed)
 " [place] EMERGENCY DEPARTMENT AT [facility] Provider Note   CSN: [id number] Arrival date & time: 03/20/24  1559     Patient presents with: Juan Goodwin   HARRINGTON JOBE is a 89 y.o. male.  Presents today for a mechanical fall while leaving the pulmonary clinic after receiving an iron  infusion.  Patient reports he tripped on a rug with his walker and fell forward.  Patient is not sure if he struck his head.  Patient denies any pain, nausea, vomiting, numbness, weakness, diplopia, tinnitus, chest pain, or shortness of breath.  Patient denies blood thinner use.    Fall       Prior to Admission medications  Medication Sig Start Date End Date Taking? Authorizing Provider  cholecalciferol  (CHOLECALCIFEROL ) 25 MCG tablet Take 1 tablet (1,000 Units total) by mouth daily. 01/29/24   Sherrill Cable Latif, DO  isosorbide  mononitrate (IMDUR ) 30 MG 24 hr tablet TAKE 1 TABLET(30 MG) BY MOUTH DAILY 07/07/22   Shlomo Wilbert SAUNDERS, MD  menthol  (CEPACOL) 3 MG lozenge Take 1 lozenge (3 mg total) by mouth as needed for sore throat. 01/28/24   Sherrill Cable Latif, DO  Multiple Vitamins-Minerals (MULTIVITAMIN MEN 50+) TABS Take 1 tablet by mouth daily.    [provider]  nitroGLYCERIN  (NITROSTAT ) 0.4 MG SL tablet Place 1 tablet (0.4 mg total) under the tongue every 5 (five) minutes as needed for chest pain. 09/11/22   Shlomo Wilbert SAUNDERS, MD  Nutritional Supplements (FEEDING SUPPLEMENT, NEPRO CARB STEADY,) LIQD Take 237 mLs by mouth 3 (three) times daily between meals. 01/28/24   Sheikh, Omair Latif, DO  phenol (CHLORASEPTIC) 1.4 % LIQD Use as directed 1 spray in the mouth or throat as needed for throat irritation / pain. 01/28/24   Sherrill Cable Latif, DO  rosuvastatin  (CRESTOR ) 40 MG tablet Take 1 tablet (40 mg total) by mouth daily. Patient taking differently: Take 40 mg by mouth every evening. 05/10/20   Shlomo Wilbert SAUNDERS, MD  traMADol  (ULTRAM ) 50 MG tablet Take 1 tablet (50 mg total) by mouth every 6  (six) hours as needed for moderate pain (pain score 4-6). 01/05/24   Bethanie Cough, PA-C    Allergies: Cardura [doxazosin mesylate]    Review of Systems  Updated Vital Signs BP 129/86 (BP Location: Right Arm)   Pulse 72   Temp 98.4 F (36.9 C) (Oral)   Resp 18   SpO2 100%   Physical Exam Vitals and nursing note reviewed.  Constitutional:      General: He is not in acute distress.    Appearance: He is well-developed. He is not toxic-appearing.  HENT:     Head: Normocephalic and atraumatic.     Right Ear: External ear normal.     Left Ear: External ear normal.  Eyes:     Extraocular Movements: Extraocular movements intact.     Conjunctiva/sclera: Conjunctivae normal.     Pupils: Pupils are equal, round, and reactive to light.  Cardiovascular:     Rate and Rhythm: Normal rate and regular rhythm.     Pulses: Normal pulses.     Heart sounds: Normal heart sounds. No murmur heard. Pulmonary:     Effort: Pulmonary effort is normal. No respiratory distress.     Breath sounds: Normal breath sounds.  Abdominal:     Palpations: Abdomen is soft.     Tenderness: There is no abdominal tenderness.  Musculoskeletal:        General: No swelling.     Cervical  back: Normal range of motion and neck supple. No rigidity.  Skin:    General: Skin is warm and dry.     Capillary Refill: Capillary refill takes less than 2 seconds.  Neurological:     General: No focal deficit present.     Mental Status: He is alert and oriented to person, place, and time.  Psychiatric:        Mood and Affect: Mood normal.     (all labs ordered are listed, but only abnormal results are displayed) Labs Reviewed - No data to display  EKG: None  Radiology: CT Cervical Spine Wo Contrast Result Date: 03/20/2024 CLINICAL DATA:  Status post fall. EXAM: CT CERVICAL SPINE WITHOUT CONTRAST TECHNIQUE: Multidetector CT imaging of the cervical spine was performed without intravenous contrast. Multiplanar CT image  reconstructions were also generated. RADIATION DOSE REDUCTION: This exam was performed according to the departmental dose-optimization program which includes automated exposure control, adjustment of the mA and/or kV according to patient size and/or use of iterative reconstruction technique. COMPARISON:  None Available. FINDINGS: Alignment: Normal. Skull base and vertebrae: No acute fracture. No primary bone lesion or focal pathologic process. Soft tissues and spinal canal: No prevertebral fluid or swelling. No visible canal hematoma. Disc levels: Marked severity endplate sclerosis, mild to moderate severity anterior osteophyte formation and marked severity posterior bony spurring are seen at the levels of C2-C3, C3-C4, C4-C5, C5-C6 and C6-C7. There is moderate severity intervertebral disc space narrowing at C3-C4, with marked severity intervertebral disc space narrowing at C4-C5, C5-C6 and C6-C7. Bilateral marked severity multilevel facet joint hypertrophy is noted. Upper chest: Negative. Other: None. IMPRESSION: 1. Marked severity multilevel degenerative changes, as described above. 2. No acute cervical spine fracture. Electronically Signed   By: Suzen Dials M.D.   On: 03/20/2024 17:49   CT Head Wo Contrast Result Date: 03/20/2024 CLINICAL DATA:  Status post trauma. EXAM: CT HEAD WITHOUT CONTRAST TECHNIQUE: Contiguous axial images were obtained from the base of the skull through the vertex without intravenous contrast. RADIATION DOSE REDUCTION: This exam was performed according to the departmental dose-optimization program which includes automated exposure control, adjustment of the mA and/or kV according to patient size and/or use of iterative reconstruction technique. COMPARISON:  January 14, 2024 FINDINGS: Brain: There is generalized cerebral atrophy with widening of the extra-axial spaces and ventricular dilatation. There are areas of decreased attenuation within the white matter tracts of the  supratentorial brain, consistent with microvascular disease changes. Small, bilateral chronic basal ganglia lacunar infarcts are seen. Vascular: No hyperdense vessel or unexpected calcification. Skull: A left frontal parietal craniotomy defect is noted. Sinuses/Orbits: No acute finding. Other: None. IMPRESSION: 1. Generalized cerebral atrophy and microvascular disease changes of the supratentorial brain. 2. Small, bilateral chronic basal ganglia lacunar infarcts. 3. No acute intracranial abnormality. Electronically Signed   By: Suzen Dials M.D.   On: 03/20/2024 17:46     Procedures   Medications Ordered in the ED - No data to display                                  Medical Decision Making Amount and/or Complexity of Data Reviewed Radiology: ordered.   This patient presents to the ED for concern of mechanical fall differential diagnosis includes musculoskeletal pain, fracture, dislocation, brain bleed   Imaging Studies ordered:  I ordered imaging studies including CT head and C-spine Noncon I independently visualized and interpreted imaging  which showed multilevel degenerative changes.  No acute cervical spine fracture.  No acute intracranial abnormality I agree with the radiologist interpretation  Problem List / ED Course:  Considered for admission or further workup however patient's vital signs, physical exam, and imaging are reassuring.  Patient advised to alternate Tylenol /Motrin as needed for pain.  Patient given return precautions.  I feel patient is safe for discharge at this time.     Final diagnoses:  Fall, initial encounter    ED Discharge Orders     None          Francis Ileana LOISE DEVONNA 03/20/24 1811    Tegeler, Lonni PARAS, MD 03/20/24 [phone]  "

## 2024-03-20 NOTE — Progress Notes (Addendum)
 Diagnosis: Iron  Deficiency Anemia  Provider:  Praveen Mannam MD  Procedure: IV Infusion  IV Type: Peripheral, IV Location: L Antecubital  Venofer  (Iron  Sucrose), Dose: 200 mg  Infusion Start Time: 1422  Infusion Stop Time: 1440  Post Infusion IV Care: Observation period completed and Peripheral IV Discontinued  Discharge: Condition: Stable, Destination: ER via ambulance . AVS Provided  *Patient had been discharged from infusion treatment and on the way out the front door in lobby patient tripped and fell at approximately 3:19pm. Infusion staff did not witness fall, did witness patient dragging walker (back legs were scraping across floor and not leaving the ground) while walking out of infusion center into lobby. Receptionist(Gesila Nicholaus) for [facility] saw patient and caregiver(daughter) starting to walk out the door and tumbled over into breezeway. Infusion staff was notified and went to assess. Patient bp had increased to 160/86. Daughter states patient tripped over rug in front of lobby door and fell over walker and hit head and daughter fell on patient. No blood/swelling/bruises noted. Patient denies dizziness or pain. Patient is recent post op from hip replacement. EMS was called for transfer for ER for further evaluation. Margie Smith,CMA from [facility] removed rug from in front of lobby door.     Performed by:  Leita FORBES Miles, LPN

## 2024-03-21 ENCOUNTER — Ambulatory Visit (HOSPITAL_COMMUNITY): Admission: RE | Admit: 2024-03-21 | Source: Ambulatory Visit | Attending: Vascular Surgery | Admitting: Vascular Surgery

## 2024-03-25 ENCOUNTER — Ambulatory Visit

## 2024-03-25 VITALS — BP 132/77 | HR 62 | Temp 97.5°F | Resp 16 | Ht 74.0 in | Wt 149.0 lb

## 2024-03-25 DIAGNOSIS — D539 Nutritional anemia, unspecified: Secondary | ICD-10-CM

## 2024-03-25 MED ORDER — IRON SUCROSE 200 MG IVPB - SIMPLE MED
200.0000 mg | Freq: Once | Status: AC
  Administered 2024-03-25: 200 mg via INTRAVENOUS

## 2024-03-25 MED ORDER — ACETAMINOPHEN 325 MG PO TABS
650.0000 mg | ORAL_TABLET | Freq: Once | ORAL | Status: AC
  Administered 2024-03-25: 650 mg via ORAL
  Filled 2024-03-25: qty 2

## 2024-03-25 NOTE — Progress Notes (Signed)
 Diagnosis: Iron  Deficiency Anemia  Provider:  Praveen Mannam MD  Procedure: IV Infusion  IV Type: Peripheral, IV Location: L Hand  Venofer  (Iron  Sucrose), Dose: 200 mg  Infusion Start Time: 1327  Infusion Stop Time: 1343  Post Infusion IV Care: Observation period completed and Peripheral IV Discontinued  Discharge: Condition: Good, Destination: Home . AVS Provided  Performed by:  Tyishia Aune, RN

## 2024-03-27 ENCOUNTER — Ambulatory Visit

## 2024-03-27 VITALS — BP 128/77 | HR 69 | Temp 98.2°F | Resp 16 | Ht 74.0 in | Wt 151.2 lb

## 2024-03-27 DIAGNOSIS — D539 Nutritional anemia, unspecified: Secondary | ICD-10-CM | POA: Diagnosis not present

## 2024-03-27 MED ORDER — ACETAMINOPHEN 325 MG PO TABS
650.0000 mg | ORAL_TABLET | Freq: Once | ORAL | Status: AC
  Administered 2024-03-27: 650 mg via ORAL
  Filled 2024-03-27: qty 2

## 2024-03-27 MED ORDER — IRON SUCROSE 200 MG IVPB - SIMPLE MED
200.0000 mg | Freq: Once | Status: AC
  Administered 2024-03-27: 200 mg via INTRAVENOUS
  Filled 2024-03-27: qty 110

## 2024-03-27 NOTE — Progress Notes (Signed)
 Diagnosis: , Acute Anemia  Provider:  Lonna Coder MD  Procedure: IV Infusion  IV Type: Peripheral, IV Location: R Forearm  Venofer  (Iron  Sucrose), Dose: 200 mg  Infusion Start Time: 1348  Infusion Stop Time: 1405  Post Infusion IV Care: Observation period completed and Peripheral IV Discontinued  Discharge: Condition: Good, Destination: Home . AVS Provided  Performed by:  Donny Childes, RN

## 2024-03-28 ENCOUNTER — Ambulatory Visit (HOSPITAL_COMMUNITY)
Admission: RE | Admit: 2024-03-28 | Discharge: 2024-03-28 | Disposition: A | Discharge status: Home / Self Care | Source: Ambulatory Visit | Attending: Cardiology | Admitting: Cardiology

## 2024-03-28 DIAGNOSIS — I723 Aneurysm of iliac artery: Secondary | ICD-10-CM | POA: Insufficient documentation

## 2024-03-28 DIAGNOSIS — I7143 Infrarenal abdominal aortic aneurysm, without rupture: Secondary | ICD-10-CM | POA: Diagnosis present

## 2024-03-28 MED ORDER — IOHEXOL 350 MG/ML SOLN
75.0000 mL | Freq: Once | INTRAVENOUS | Status: AC | PRN
  Administered 2024-03-28: 75 mL via INTRAVENOUS

## 2024-03-31 ENCOUNTER — Ambulatory Visit

## 2024-03-31 VITALS — BP 117/70 | HR 70 | Temp 98.0°F | Resp 20 | Ht 74.0 in | Wt 153.4 lb

## 2024-03-31 DIAGNOSIS — D539 Nutritional anemia, unspecified: Secondary | ICD-10-CM | POA: Diagnosis not present

## 2024-03-31 MED ORDER — IRON SUCROSE 200 MG IVPB - SIMPLE MED
200.0000 mg | Freq: Once | Status: AC
  Administered 2024-03-31: 200 mg via INTRAVENOUS
  Filled 2024-03-31: qty 110

## 2024-03-31 MED ORDER — ACETAMINOPHEN 325 MG PO TABS
650.0000 mg | ORAL_TABLET | Freq: Once | ORAL | Status: AC
  Administered 2024-03-31: 650 mg via ORAL
  Filled 2024-03-31: qty 2

## 2024-03-31 NOTE — Progress Notes (Signed)
 Diagnosis: Iron  Deficiency Anemia  Provider:  Mannam, Praveen MD  Procedure: IV Infusion  IV Type: Peripheral, IV Location: L Hand   Venofer  (Iron  Sucrose), Dose: 200 mg  Infusion Start Time: 1340 pm  Infusion Stop Time: 1356 pm  Post Infusion IV Care: Observation period completed and Peripheral IV Discontinued  Discharge: Condition: Good, Destination: Home . AVS Declined  Performed by:  Trudy Lamarr LABOR, RN

## 2024-04-01 ENCOUNTER — Ambulatory Visit

## 2024-04-01 ENCOUNTER — Ambulatory Visit: Admitting: Podiatry

## 2024-04-01 ENCOUNTER — Encounter: Payer: Self-pay | Admitting: Podiatry

## 2024-04-01 DIAGNOSIS — D696 Thrombocytopenia, unspecified: Secondary | ICD-10-CM | POA: Diagnosis not present

## 2024-04-01 DIAGNOSIS — M79674 Pain in right toe(s): Secondary | ICD-10-CM | POA: Diagnosis not present

## 2024-04-01 DIAGNOSIS — M79675 Pain in left toe(s): Secondary | ICD-10-CM

## 2024-04-01 DIAGNOSIS — B351 Tinea unguium: Secondary | ICD-10-CM

## 2024-04-01 NOTE — Progress Notes (Signed)
 This patient returns to my office for at risk foot care.  This patient requires this care by a professional since this patient will be at risk due to having CKD and thrombocytopenia and diabetes. This patient is unable to cut nails himself since the patient cannot reach his nails.These nails are painful walking and wearing shoes. He presents to the office with his daughter.This patient presents for at risk foot care today.  General Appearance  Alert, conversant and in no acute stress.  Vascular  Dorsalis pedis and posterior tibial  pulses are  weakly palpable  bilaterally.  Capillary return is within normal limits  bilaterally. Temperature is within normal limits  bilaterally.  Neurologic  Senn-Weinstein monofilament wire test within normal limits  bilaterally. Muscle power within normal limits bilaterally.  Nails Thick disfigured discolored nails with subungual debris  from hallux to fifth toes bilaterally. No evidence of bacterial infection or drainage bilaterally.  Orthopedic  No limitations of motion  feet .  No crepitus or effusions noted. Severe HAV B/L and contracted digits.  Skin  normotropic skin with no porokeratosis noted bilaterally.  No signs of infections or ulcers noted.     Onychomycosis  Pain in right toes  Pain in left toes  Consent was obtained for treatment procedures.   Mechanical debridement of nails 1-5  bilaterally performed with a nail nipper.  Filed with dremel without incident.    Return office visit     3 months                 Told patient to return for periodic foot care and evaluation due to potential at risk complications.   Cordella Bold DPM

## 2024-04-02 ENCOUNTER — Ambulatory Visit (HOSPITAL_COMMUNITY)
Admission: RE | Admit: 2024-04-02 | Discharge: 2024-04-02 | Disposition: A | Discharge status: Home / Self Care | Source: Ambulatory Visit | Attending: Cardiology | Admitting: Cardiology

## 2024-04-02 DIAGNOSIS — I77810 Thoracic aortic ectasia: Secondary | ICD-10-CM | POA: Diagnosis present

## 2024-04-02 DIAGNOSIS — I10 Essential (primary) hypertension: Secondary | ICD-10-CM

## 2024-04-02 LAB — ECHOCARDIOGRAM COMPLETE
Area-P 1/2: 2.72 cm2
S' Lateral: 2.4 cm

## 2024-04-03 ENCOUNTER — Ambulatory Visit: Payer: Self-pay | Admitting: Cardiology

## 2024-04-03 ENCOUNTER — Ambulatory Visit

## 2024-04-03 ENCOUNTER — Encounter: Payer: Self-pay | Admitting: Cardiology

## 2024-04-03 DIAGNOSIS — I77810 Thoracic aortic ectasia: Secondary | ICD-10-CM

## 2024-04-04 ENCOUNTER — Ambulatory Visit

## 2024-04-04 VITALS — BP 110/67 | HR 64 | Temp 98.4°F | Resp 16 | Ht 74.0 in | Wt 152.2 lb

## 2024-04-04 DIAGNOSIS — D539 Nutritional anemia, unspecified: Secondary | ICD-10-CM | POA: Diagnosis not present

## 2024-04-04 MED ORDER — ACETAMINOPHEN 325 MG PO TABS
650.0000 mg | ORAL_TABLET | Freq: Once | ORAL | Status: AC
  Administered 2024-04-04: 650 mg via ORAL
  Filled 2024-04-04: qty 2

## 2024-04-04 MED ORDER — IRON SUCROSE 200 MG IVPB - SIMPLE MED
200.0000 mg | Freq: Once | Status: AC
  Administered 2024-04-04: 200 mg via INTRAVENOUS
  Filled 2024-04-04: qty 110

## 2024-04-04 NOTE — Progress Notes (Signed)
 Diagnosis: Iron  Deficiency Anemia  Provider:  Mannam, Praveen MD  Procedure: IV Infusion  IV Type: Peripheral, IV Location: R Hand  Venofer  (Iron  Sucrose), Dose: 200 mg  Infusion Start Time: 1348  Infusion Stop Time: 1402  Post Infusion IV Care: Observation period completed and Peripheral IV Discontinued  Discharge: Condition: Good, Destination: Home . AVS Provided  Performed by:  Donny Childes, BSN

## 2024-04-10 ENCOUNTER — Ambulatory Visit: Admitting: Vascular Surgery

## 2024-04-10 NOTE — Telephone Encounter (Signed)
 Call to patient to discuss echo results, spoke with dtr Juan Goodwin per DPR. Juan Goodwin verbalizes understanding of  normal pumping function of the heart muscle EF 55 to 60% with moderately thickened heart muscle and increase stiffness of the heart muscle called diastolic dysfunction normal for  age.  There is trivial leakiness of the mitral valve.  The acing aorta is mildly enlarged at 42 mm.  Compared to echo 2024 there is no significant change and the ascending aortic dimensions.      Juan Goodwin agrees 2D echo in 1 year for dilated aorta.

## 2024-04-10 NOTE — Telephone Encounter (Signed)
-----   Message from Wilbert Bihari, MD sent at 04/03/2024  9:29 AM EST ----- 2D echo demonstrated normal pumping function of the heart muscle EF 55 to 60% with moderately thickened heart muscle and increase stiffness of the heart muscle called diastolic dysfunction normal for  age.  There is trivial leakiness of the mitral valve.  The acing aorta is mildly enlarged at 42 mm.  Compared to echo 2024 there is no significant change and the ascending aortic dimensions.  Repeat  2D echo in 1 year for dilated aorta

## 2024-04-21 ENCOUNTER — Ambulatory Visit: Admitting: Orthopedic Surgery

## 2024-05-08 ENCOUNTER — Ambulatory Visit: Admitting: Vascular Surgery

## 2024-06-17 ENCOUNTER — Inpatient Hospital Stay

## 2024-06-17 ENCOUNTER — Inpatient Hospital Stay: Admitting: Hematology and Oncology

## 2024-06-30 ENCOUNTER — Ambulatory Visit: Admitting: Podiatry
